# Patient Record
Sex: Male | Born: 1970 | Race: White | Hispanic: No | Marital: Married | Smoking: Current every day smoker
Health system: Southern US, Community
[De-identification: ages and names within clinical notes are randomized; demographics above are authoritative.]

## PROBLEM LIST (undated history)

## (undated) DIAGNOSIS — G8929 Other chronic pain: Secondary | ICD-10-CM

## (undated) DIAGNOSIS — C801 Malignant (primary) neoplasm, unspecified: Secondary | ICD-10-CM

## (undated) DIAGNOSIS — K219 Gastro-esophageal reflux disease without esophagitis: Secondary | ICD-10-CM

## (undated) DIAGNOSIS — M21379 Foot drop, unspecified foot: Secondary | ICD-10-CM

## (undated) DIAGNOSIS — F419 Anxiety disorder, unspecified: Secondary | ICD-10-CM

## (undated) DIAGNOSIS — F1910 Other psychoactive substance abuse, uncomplicated: Secondary | ICD-10-CM

## (undated) DIAGNOSIS — M1990 Unspecified osteoarthritis, unspecified site: Secondary | ICD-10-CM

## (undated) DIAGNOSIS — E860 Dehydration: Secondary | ICD-10-CM

## (undated) DIAGNOSIS — R569 Unspecified convulsions: Secondary | ICD-10-CM

## (undated) DIAGNOSIS — E039 Hypothyroidism, unspecified: Secondary | ICD-10-CM

## (undated) DIAGNOSIS — D649 Anemia, unspecified: Secondary | ICD-10-CM

## (undated) DIAGNOSIS — D72829 Elevated white blood cell count, unspecified: Secondary | ICD-10-CM

---

## 2009-10-10 ENCOUNTER — Emergency Department (HOSPITAL_COMMUNITY): Admission: EM | Admit: 2009-10-10 | Discharge: 2009-10-10 | Payer: Self-pay | Admitting: Emergency Medicine

## 2011-05-24 ENCOUNTER — Emergency Department (HOSPITAL_COMMUNITY)
Admission: EM | Admit: 2011-05-24 | Discharge: 2011-05-25 | Disposition: A | Discharge status: Home / Self Care | Payer: Medicare Other | Attending: Emergency Medicine | Admitting: Emergency Medicine

## 2011-05-24 ENCOUNTER — Emergency Department (HOSPITAL_COMMUNITY): Payer: Medicare Other

## 2011-05-24 DIAGNOSIS — M79609 Pain in unspecified limb: Secondary | ICD-10-CM | POA: Insufficient documentation

## 2011-05-24 DIAGNOSIS — K219 Gastro-esophageal reflux disease without esophagitis: Secondary | ICD-10-CM | POA: Insufficient documentation

## 2011-05-24 DIAGNOSIS — R609 Edema, unspecified: Secondary | ICD-10-CM | POA: Insufficient documentation

## 2011-05-24 DIAGNOSIS — M129 Arthropathy, unspecified: Secondary | ICD-10-CM | POA: Insufficient documentation

## 2011-05-24 DIAGNOSIS — M7989 Other specified soft tissue disorders: Secondary | ICD-10-CM | POA: Insufficient documentation

## 2011-05-24 DIAGNOSIS — E039 Hypothyroidism, unspecified: Secondary | ICD-10-CM | POA: Insufficient documentation

## 2011-05-24 DIAGNOSIS — G8929 Other chronic pain: Secondary | ICD-10-CM | POA: Insufficient documentation

## 2011-05-24 LAB — DIFFERENTIAL
Basophils Relative: 1 % (ref 0–1)
Eosinophils Absolute: 0.3 10*3/uL (ref 0.0–0.7)
Eosinophils Relative: 4 % (ref 0–5)
Lymphs Abs: 1.9 10*3/uL (ref 0.7–4.0)
Monocytes Absolute: 0.4 10*3/uL (ref 0.1–1.0)
Monocytes Relative: 6 % (ref 3–12)
Neutrophils Relative %: 62 % (ref 43–77)

## 2011-05-24 LAB — BASIC METABOLIC PANEL
Calcium: 8 mg/dL — ABNORMAL LOW (ref 8.4–10.5)
Creatinine, Ser: 0.99 mg/dL (ref 0.50–1.35)
GFR calc Af Amer: >60 mL/min (ref >60)
GFR calc non Af Amer: >60 mL/min (ref >60)
Sodium: 134 mEq/L — ABNORMAL LOW (ref 135–145)

## 2011-05-24 LAB — CBC
MCH: 29.9 pg (ref 26.0–34.0)
MCHC: 33.2 g/dL (ref 30.0–36.0)
MCV: 90.1 fL (ref 78.0–100.0)
Platelets: 341 10*3/uL (ref 150–400)
RBC: 3.34 MIL/uL — ABNORMAL LOW (ref 4.22–5.81)
RDW: 12.9 % (ref 11.5–15.5)

## 2011-05-24 LAB — PRO B NATRIURETIC PEPTIDE: Pro B Natriuretic peptide (BNP): 206 pg/mL — ABNORMAL HIGH (ref 0–125)

## 2011-06-09 ENCOUNTER — Emergency Department (HOSPITAL_COMMUNITY)
Admission: EM | Admit: 2011-06-09 | Discharge: 2011-06-09 | Disposition: A | Discharge status: Home / Self Care | Payer: Medicare Other | Attending: Emergency Medicine | Admitting: Emergency Medicine

## 2011-06-09 DIAGNOSIS — F259 Schizoaffective disorder, unspecified: Secondary | ICD-10-CM | POA: Insufficient documentation

## 2011-06-09 DIAGNOSIS — M79609 Pain in unspecified limb: Secondary | ICD-10-CM | POA: Insufficient documentation

## 2011-06-09 DIAGNOSIS — F411 Generalized anxiety disorder: Secondary | ICD-10-CM | POA: Insufficient documentation

## 2011-06-09 DIAGNOSIS — Z79899 Other long term (current) drug therapy: Secondary | ICD-10-CM | POA: Insufficient documentation

## 2011-06-09 DIAGNOSIS — E039 Hypothyroidism, unspecified: Secondary | ICD-10-CM | POA: Insufficient documentation

## 2011-06-09 DIAGNOSIS — R609 Edema, unspecified: Secondary | ICD-10-CM | POA: Insufficient documentation

## 2011-06-10 ENCOUNTER — Ambulatory Visit (HOSPITAL_COMMUNITY): Payer: Medicaid Other | Attending: Emergency Medicine

## 2011-06-11 ENCOUNTER — Emergency Department (HOSPITAL_COMMUNITY)
Admission: EM | Admit: 2011-06-11 | Discharge: 2011-06-11 | Disposition: A | Discharge status: Home / Self Care | Payer: Medicare Other | Attending: Emergency Medicine | Admitting: Emergency Medicine

## 2011-06-11 DIAGNOSIS — H6690 Otitis media, unspecified, unspecified ear: Secondary | ICD-10-CM | POA: Insufficient documentation

## 2011-06-11 DIAGNOSIS — H60399 Other infective otitis externa, unspecified ear: Secondary | ICD-10-CM | POA: Insufficient documentation

## 2011-06-11 DIAGNOSIS — Z79899 Other long term (current) drug therapy: Secondary | ICD-10-CM | POA: Insufficient documentation

## 2011-12-08 ENCOUNTER — Emergency Department (HOSPITAL_COMMUNITY): Payer: Medicare Other

## 2011-12-08 ENCOUNTER — Encounter (HOSPITAL_COMMUNITY): Payer: Self-pay | Admitting: Physical Medicine and Rehabilitation

## 2011-12-08 ENCOUNTER — Emergency Department (HOSPITAL_COMMUNITY)
Admission: EM | Admit: 2011-12-08 | Discharge: 2011-12-08 | Disposition: A | Discharge status: Home / Self Care | Payer: Medicare Other | Attending: Emergency Medicine | Admitting: Emergency Medicine

## 2011-12-08 DIAGNOSIS — G8929 Other chronic pain: Secondary | ICD-10-CM

## 2011-12-08 DIAGNOSIS — J45909 Unspecified asthma, uncomplicated: Secondary | ICD-10-CM | POA: Insufficient documentation

## 2011-12-08 DIAGNOSIS — M25579 Pain in unspecified ankle and joints of unspecified foot: Secondary | ICD-10-CM | POA: Insufficient documentation

## 2011-12-08 DIAGNOSIS — Z79899 Other long term (current) drug therapy: Secondary | ICD-10-CM | POA: Insufficient documentation

## 2011-12-08 DIAGNOSIS — Z8739 Personal history of other diseases of the musculoskeletal system and connective tissue: Secondary | ICD-10-CM | POA: Insufficient documentation

## 2011-12-08 HISTORY — DX: Other chronic pain: G89.29

## 2011-12-08 HISTORY — DX: Unspecified convulsions: R56.9

## 2011-12-08 HISTORY — DX: Foot drop, unspecified foot: M21.379

## 2011-12-08 HISTORY — DX: Anxiety disorder, unspecified: F41.9

## 2011-12-08 HISTORY — DX: Unspecified osteoarthritis, unspecified site: M19.90

## 2011-12-08 HISTORY — DX: Anemia, unspecified: D64.9

## 2011-12-08 MED ORDER — OXYCODONE-ACETAMINOPHEN 5-325 MG PO TABS
2.0000 | ORAL_TABLET | Freq: Once | ORAL | Status: AC
  Administered 2011-12-08: 2 via ORAL
  Filled 2011-12-08: qty 2

## 2011-12-08 MED ORDER — ACETAMINOPHEN-CODEINE #3 300-30 MG PO TABS: 1.0000 | ORAL_TABLET | Freq: Four times a day (QID) | ORAL | Status: AC | PRN

## 2011-12-08 NOTE — ED Provider Notes (Signed)
History     CSN: [id number]  Arrival date & time 12/08/11  1358   First MD Initiated Contact with Patient 12/08/11 1423      Chief Complaint  Patient presents with  . Foot Pain    BIL and leg pain,skin is red     (Consider location/radiation/quality/duration/timing/severity/associated sxs/prior treatment) Patient is a 41 y.o. male presenting with lower extremity pain. The history is provided by the patient. No language interpreter was used.  Foot Pain This is a chronic problem. The current episode started more than 1 week ago (2 weeks ago). The problem occurs constantly. The problem has been gradually worsening. Pertinent negatives include no chest pain, no abdominal pain, no headaches and no shortness of breath. The symptoms are aggravated by walking and standing. Relieved by: tylenol 3 - vicodin not helping.    Past Medical History  Diagnosis Date  . Chronic pain   . Anxiety   . Schizoaffective disorder   . Hearing loss   . Seizure   . Arthritis   . Asthma   . Foot drop   . Anemia     History reviewed. No pertinent past surgical history.  History reviewed. No pertinent family history.  History  Substance Use Topics  . Smoking status: Never Smoker   . Smokeless tobacco: Not on file  . Alcohol Use: No      Review of Systems  Constitutional: Negative for fever, activity change, appetite change and fatigue.  HENT: Negative for congestion, sore throat, rhinorrhea, neck pain and neck stiffness.   Respiratory: Negative for cough and shortness of breath.   Cardiovascular: Negative for chest pain and palpitations.  Gastrointestinal: Negative for nausea, vomiting and abdominal pain.  Genitourinary: Negative for dysuria, urgency, frequency and flank pain.  Musculoskeletal: Positive for myalgias, arthralgias and gait problem. Negative for back pain.  Neurological: Negative for dizziness, weakness, light-headedness, numbness and headaches.  All other systems reviewed and  are negative.    Allergies  Review of patient's allergies indicates no known allergies.  Home Medications   Current Outpatient Rx  Name Route Sig Dispense Refill  . ALBUTEROL SULFATE HFA 108 (90 BASE) MCG/ACT IN AERS Inhalation Inhale 2 puffs into the lungs every 4 (four) hours as needed. For wheezing.    Marland Kitchen ALPRAZOLAM 0.5 MG PO TABS Oral Take 0.5 mg by mouth daily at 12 noon.    . ARIPIPRAZOLE 5 MG PO TABS Oral Take 5 mg by mouth at bedtime.    Marland Kitchen BENZTROPINE MESYLATE 0.5 MG PO TABS Oral Take 0.5 mg by mouth 2 (two) times daily. Given 2 hours after thyroid medication    . DOXEPIN HCL 50 MG PO CAPS Oral Take 50 mg by mouth at bedtime.    Marland Kitchen FERROUS SULFATE 325 (65 FE) MG PO TABS Oral Take 325 mg by mouth daily with breakfast.    . FLUOXETINE HCL 20 MG PO CAPS Oral Take 20 mg by mouth daily.    Marland Kitchen FLUTICASONE-SALMETEROL 100-50 MCG/DOSE IN AEPB Inhalation Inhale 1 puff into the lungs 2 (two) times daily.    Marland Kitchen LEVOTHYROXINE SODIUM 75 MCG PO TABS Oral Take 75 mcg by mouth daily.    Marland Kitchen LORATADINE 10 MG PO TABS Oral Take 10 mg by mouth daily.    . MEGESTROL ACETATE 40 MG/ML PO SUSP Oral Take 400 mg by mouth daily.    Marland Kitchen ENSURE PO Oral Take 1 Can by mouth 3 (three) times daily with meals.    Marland Kitchen OLANZAPINE 15  MG PO TABS Oral Take 15 mg by mouth at bedtime.    . OMEPRAZOLE 40 MG PO CPDR Oral Take 40 mg by mouth 2 (two) times daily.    Marland Kitchen PHENYTOIN SODIUM EXTENDED 100 MG PO CAPS Oral Take 300 mg by mouth at bedtime.    Marland Kitchen POLYETHYLENE GLYCOL 3350 PO POWD Oral Take 17 g by mouth daily.    Marland Kitchen SOLIFENACIN SUCCINATE 5 MG PO TABS Oral Take 10 mg by mouth daily.    . ACETAMINOPHEN-CODEINE #3 300-30 MG PO TABS Oral Take 1-2 tablets by mouth every 6 (six) hours as needed for pain. 20 tablet 0    BP 105/72  Pulse 88  Resp 18  SpO2 100%  Physical Exam  Nursing note and vitals reviewed. Constitutional: He is oriented to person, place, and time. He appears well-developed and well-nourished. No distress.    HENT:  Head: Normocephalic and atraumatic.  Mouth/Throat: Oropharynx is clear and moist.  Eyes: Conjunctivae and EOM are normal. Pupils are equal, round, and reactive to light.  Neck: Normal range of motion. Neck supple.  Cardiovascular: Normal rate, regular rhythm, normal heart sounds and intact distal pulses.  Exam reveals no gallop and no friction rub.   No murmur heard. Pulmonary/Chest: Effort normal and breath sounds normal. No respiratory distress.  Abdominal: Soft. Bowel sounds are normal. There is no tenderness.  Musculoskeletal: Normal range of motion. He exhibits tenderness (R lateral foot).       No deformity.  Able to ambulate without difficulty  Neurological: He is alert and oriented to person, place, and time. No cranial nerve deficit.  Skin: Skin is warm and dry.    ED Course  Procedures (including critical care time)  Labs Reviewed - No data to display Dg Foot Complete Right  12/08/2011  *RADIOLOGY REPORT*  Clinical Data: Right foot pain  RIGHT FOOT COMPLETE - 3+ VIEW  Comparison: None.  Findings: Bones are markedly osteopenic.  No acute fracture and no dislocation.  Linear sclerotic densities in the distal tibia are of unknown significance but may represent bone infarcts.  Mild soft tissue swelling over the forefoot.  IMPRESSION: No acute bony pathology.  Chronic change.  Original Report Authenticated By: Donavan Burnet, M.D.     1. Chronic foot pain       MDM  Chronic foot pain. He'll be prescribed Tylenol 3 as he states this is what helps his pain. Received a dose of Percocet in emergency department. Imaging is negative. Will be discharged home with instructions to followup        Dayton Bailiff, MD 12/08/11 1535

## 2011-12-08 NOTE — Discharge Instructions (Signed)
Chronic Pain °Chronic pain can be defined as pain that is lasting, off and on, and lasts for 3 to 6 months or longer. Many things cause chronic pain, which can make it difficult to make a discrete diagnosis. There are many treatment options available for chronic pain. However, finding a treatment that works well for you may require trying various approaches until a suitable one is found. °CAUSES  °In some types of chronic medical conditions, the pain is caused by a normal pain response within the body. A normal pain response helps the body identify illness or injury and prevent further damage from being done. In these cases, the cause of the pain may be identified and treated, even if it may not be cured completely. Examples of chronic conditions which can cause chronic pain include: °· Inflammation of the joints (arthritis).  °· Back pain or neck pain (including bulging or herniated disks).  °· Migraine headaches.  °· Cancer.  °In some other types of chronic pain syndromes, the pain is caused by an abnormal pain response within the body. An abnormal pain response is present when there is no ongoing cause (or stimulus) for the pain, or when the cause of the pain is arising from the nerves or nervous system itself. Examples of conditions which can cause chronic pain due to an abnormal pain response include: °· Fibromyalgia.  °· Reflex sympathetic dystrophy (RSD).  °· Neuropathy (when the nerves themselves are damaged, and may cause pain).  °DIAGNOSIS  °Your caregiver will help diagnose your condition over time. In many cases, the initial focus will be on excluding conditions that could be causing the pain. Depending on your symptoms, your caregiver may order some tests to diagnose your condition. Some of these tests include: °· Blood tests.  °· Computerized X-ray scans (CT scan).  °· Computerized magnetic scans (MRI).  °· X-rays.  °· Ultrasounds.  °· Nerve conduction studies.  °· Consultation with other physicians or  specialists.  °TREATMENT  °There are many treatment options for people suffering from chronic pain. Finding a treatment that works well may take time.  °· You may be referred to a pain management specialist.  °· You may be put on medication to help with the pain. Unfortunately, some medications (such as opiate medications) may not be very effective in cases where chronic pain is due to abnormal pain responses. Finding the right medications can take some time.  °· Adjunctive therapies may be used to provide additional relief and improve a patient's quality of life. These therapies include:  °· Mindfulness meditation.  °· Acupuncture.  °· Biofeedback.  °· Cognitive-behavioral therapy.  °· In certain cases, surgical interventions may be attempted.  °HOME CARE INSTRUCTIONS  °· Make sure you understand these instructions prior to discharge.  °· Ask any questions and share any further concerns you have with your caregiver prior to discharge.  °· Take all medications as directed by your caregiver.  °· Keep all follow-up appointments.  °SEEK MEDICAL CARE IF:  °· Your pain gets worse.  °· You develop a new pain that was not present before.  °· You cannot tolerate any medications prescribed by your caregiver.  °· You develop new symptoms since your last visit with your caregiver.  °SEEK IMMEDIATE MEDICAL CARE IF:  °· You develop muscular weakness.  °· You have decreased sensation or numbness.  °· You lose control of bowel or bladder function.  °· Your pain suddenly gets much worse.  °· You have an oral   temperature above 102° F (38.9° C), not controlled by medication.  °· You develop shaking chills, confusion, chest pain, or shortness of breath.  °Document Released: 06/22/2002 Document Revised: 06/12/2011 Document Reviewed: 09/28/2008 °[employer]® Patient Information ©2012 [employer]. °

## 2011-12-08 NOTE — ED Notes (Signed)
Pt presents to department for evaluation of chronic bilateral foot pain. Ongoing for 2-3weeks. Pt states "I have chronic nerve pain." able to transfer from wheelchair to exam room chair. No obvious injuries/deformity noted. Pedal pulses present bilaterally. Skin warm and dry. Pt alert and oriented x4.

## 2011-12-08 NOTE — ED Notes (Signed)
PT reports chronic pain in bil feet, legs

## 2012-01-16 ENCOUNTER — Emergency Department (HOSPITAL_COMMUNITY)
Admission: EM | Admit: 2012-01-16 | Discharge: 2012-01-16 | Disposition: A | Discharge status: Skilled Nursing Facility | Payer: Medicare Other | Attending: Emergency Medicine | Admitting: Emergency Medicine

## 2012-01-16 ENCOUNTER — Emergency Department (HOSPITAL_COMMUNITY): Payer: Medicare Other

## 2012-01-16 DIAGNOSIS — W050XXA Fall from non-moving wheelchair, initial encounter: Secondary | ICD-10-CM | POA: Insufficient documentation

## 2012-01-16 DIAGNOSIS — G8929 Other chronic pain: Secondary | ICD-10-CM | POA: Insufficient documentation

## 2012-01-16 DIAGNOSIS — Z79899 Other long term (current) drug therapy: Secondary | ICD-10-CM | POA: Insufficient documentation

## 2012-01-16 DIAGNOSIS — G40909 Epilepsy, unspecified, not intractable, without status epilepticus: Secondary | ICD-10-CM | POA: Insufficient documentation

## 2012-01-16 DIAGNOSIS — M129 Arthropathy, unspecified: Secondary | ICD-10-CM | POA: Insufficient documentation

## 2012-01-16 DIAGNOSIS — J45909 Unspecified asthma, uncomplicated: Secondary | ICD-10-CM | POA: Insufficient documentation

## 2012-01-16 DIAGNOSIS — R071 Chest pain on breathing: Secondary | ICD-10-CM | POA: Insufficient documentation

## 2012-01-16 DIAGNOSIS — R0789 Other chest pain: Secondary | ICD-10-CM

## 2012-01-16 DIAGNOSIS — S37019A Minor contusion of unspecified kidney, initial encounter: Secondary | ICD-10-CM

## 2012-01-16 LAB — URINALYSIS, ROUTINE W REFLEX MICROSCOPIC
Bilirubin Urine: NEGATIVE
Glucose, UA: NEGATIVE mg/dL
Ketones, ur: NEGATIVE mg/dL
Leukocytes, UA: NEGATIVE
Nitrite: NEGATIVE
Protein, ur: NEGATIVE mg/dL
Specific Gravity, Urine: 1.013 (ref 1.005–1.030)
Urobilinogen, UA: 0.2 mg/dL (ref 0.0–1.0)
pH: 6.5 (ref 5.0–8.0)

## 2012-01-16 LAB — URINE MICROSCOPIC-ADD ON

## 2012-01-16 MED ORDER — HYDROCODONE-ACETAMINOPHEN 5-325 MG PO TABS
1.0000 | ORAL_TABLET | Freq: Once | ORAL | Status: AC
  Administered 2012-01-16: 1 via ORAL
  Filled 2012-01-16: qty 1

## 2012-01-16 MED ORDER — KETOROLAC TROMETHAMINE 60 MG/2ML IM SOLN
60.0000 mg | Freq: Once | INTRAMUSCULAR | Status: AC
  Administered 2012-01-16: 60 mg via INTRAMUSCULAR
  Filled 2012-01-16: qty 2

## 2012-01-16 MED ORDER — TRAMADOL HCL 50 MG PO TABS: 50.0000 mg | ORAL_TABLET | Freq: Four times a day (QID) | ORAL | Status: AC | PRN

## 2012-01-16 NOTE — ED Notes (Signed)
Per ptar is from [facility]. Pt has schitzoaffective disorder and poly substance abuse.  pt has footdrop and wears an orthotic. Pt reports he doesn't walk. 4 days ago pt was in wheelchair and flipped it and fell out of the wheelchair and the wheelchair landed on top of him. At that time pt did not go to the ER to be evaluated. At present complaining of ongoing rib pain to left side. 10/10. Good equal lung sounds and chest expansion. Abdomin tender to palpation no crepitus noted. Pt is hard of hearing. No dyspnea.

## 2012-01-16 NOTE — ED Provider Notes (Signed)
History     CSN: [id number]  Arrival date & time 01/16/12  1306   First MD Initiated Contact with Patient 01/16/12 1333      Chief Complaint  Patient presents with  . Fall    Larey Seat out of W/C four days ago. C/O left flank pain.    (Consider location/radiation/quality/duration/timing/severity/associated sxs/prior treatment) HPI Comments: Pt reports fell from wheelchair and has had persistent left rib pain, worse with coughing.  He has been receiving tylenol #3 and reports no improvmeen tin pain.  No N/V/D, no abd pain, back pain, no LOC or syncope.  No rash or bruising.  No fever or cough.  Denies focal weakness or numbness.    The history is provided by the patient.    Past Medical History  Diagnosis Date  . Chronic pain   . Anxiety   . Schizoaffective disorder   . Hearing loss   . Seizure   . Arthritis   . Asthma   . Foot drop   . Anemia     No past surgical history on file.  No family history on file.  History  Substance Use Topics  . Smoking status: Never Smoker   . Smokeless tobacco: Not on file  . Alcohol Use: No      Review of Systems  Constitutional: Negative.   HENT: Negative for neck pain.   Respiratory: Negative for shortness of breath.   Cardiovascular: Positive for chest pain. Negative for leg swelling.  Musculoskeletal: Negative for back pain.  Neurological: Negative for syncope.    Allergies  Allegra; Percocet; and Thorazine  Home Medications   Current Outpatient Rx  Name Route Sig Dispense Refill  . ALPRAZOLAM 0.5 MG PO TABS Oral Take 0.5 mg by mouth daily at 12 noon.    . ARIPIPRAZOLE 5 MG PO TABS Oral Take 5 mg by mouth at bedtime.    Marland Kitchen BENZTROPINE MESYLATE 0.5 MG PO TABS Oral Take 0.5 mg by mouth 2 (two) times daily. Given 2 hours after thyroid medication    . DOXEPIN HCL 50 MG PO CAPS Oral Take 50 mg by mouth at bedtime.    Marland Kitchen FERROUS SULFATE 325 (65 FE) MG PO TABS Oral Take 325 mg by mouth daily with breakfast.    . FLUOXETINE HCL  20 MG PO CAPS Oral Take 20 mg by mouth daily.    Marland Kitchen FLUTICASONE-SALMETEROL 100-50 MCG/DOSE IN AEPB Inhalation Inhale 1 puff into the lungs 2 (two) times daily.    Marland Kitchen LEVOTHYROXINE SODIUM 75 MCG PO TABS Oral Take 75 mcg by mouth daily.    Marland Kitchen LORATADINE 10 MG PO TABS Oral Take 10 mg by mouth daily.    . MEGESTROL ACETATE 40 MG/ML PO SUSP Oral Take 400 mg by mouth daily.    Marland Kitchen ENSURE PO Oral Take 1 Can by mouth 3 (three) times daily with meals.    Marland Kitchen OLANZAPINE 15 MG PO TABS Oral Take 15 mg by mouth at bedtime.    . OMEPRAZOLE 40 MG PO CPDR Oral Take 40 mg by mouth 2 (two) times daily.    Marland Kitchen PHENYTOIN SODIUM EXTENDED 100 MG PO CAPS Oral Take 300 mg by mouth at bedtime.    Marland Kitchen POLYETHYLENE GLYCOL 3350 PO POWD Oral Take 17 g by mouth daily.    Marland Kitchen SIMVASTATIN 10 MG PO TABS Oral Take 10 mg by mouth at bedtime.    . SOLIFENACIN SUCCINATE 5 MG PO TABS Oral Take 10 mg by mouth daily.    Marland Kitchen  ALBUTEROL SULFATE HFA 108 (90 BASE) MCG/ACT IN AERS Inhalation Inhale 2 puffs into the lungs every 4 (four) hours as needed. For wheezing.    Marland Kitchen TRAMADOL HCL 50 MG PO TABS Oral Take 1 tablet (50 mg total) by mouth every 6 (six) hours as needed for pain. 20 tablet 0    BP 113/66  Pulse 99  Temp(Src) 98.4 F (36.9 C) (Oral)  Resp 16  SpO2 95%  Physical Exam  Nursing note and vitals reviewed. Constitutional: He appears well-developed and well-nourished. No distress.  HENT:  Head: Normocephalic.  Cardiovascular: Normal rate and regular rhythm.   Pulmonary/Chest: Effort normal and breath sounds normal. No respiratory distress. He has no wheezes. He has no rales. He exhibits tenderness.  Abdominal: Soft.  Neurological: He is alert.  Skin: Skin is warm. No rash noted.    ED Course  Procedures (including critical care time)   Labs Reviewed  URINALYSIS, ROUTINE W REFLEX MICROSCOPIC   Dg Ribs Unilateral W/chest Left  01/16/2012  *RADIOLOGY REPORT*  Clinical Data: Fall from wheelchair.  Left lower anterior rib pain.   LEFT RIBS AND CHEST - 3+ VIEW  Comparison: Two-view chest 05/24/2011.  Findings: The heart size is normal.  Emphysematous changes are again noted.  No focal airspace disease is evident.  Dedicated views of the ribs demonstrate no acute or healing fractures.  IMPRESSION:  1.  No evidence for acute or healing fracture. 2.  No pneumothorax. 3.  Emphysema. 4.  No acute cardiopulmonary disease.  Original Report Authenticated By: Jamesetta Orleans. MATTERN, M.D.     1. Chest wall pain       MDM  Pt with no guard or rebound.  Lungs clear, RA sats are normal.  Awaiting UA for gross hematuria.  Otherwise can go home on ultram.  Signed out to Dr. Radford Pax.          Gavin Pound. Tatsuo Musial, MD 01/16/12 1549

## 2012-01-16 NOTE — Discharge Instructions (Signed)
Chest Wall Pain Chest wall pain is pain in or around the bones and muscles of your chest. It may take up to 6 weeks to get better. It may take longer if you must stay physically active in your work and activities.  CAUSES  Chest wall pain may happen on its own. However, it may be caused by:  A viral illness like the flu.   Injury.   Coughing.   Exercise.   Arthritis.   Fibromyalgia.   Shingles.  HOME CARE INSTRUCTIONS   Avoid overtiring physical activity. Try not to strain or perform activities that cause pain. This includes any activities using your chest or your abdominal and side muscles, especially if heavy weights are used.   Put ice on the sore area.   Put ice in a plastic bag.   Place a towel between your skin and the bag.   Leave the ice on for 15 to 20 minutes per hour while awake for the first 2 days.   Only take over-the-counter or prescription medicines for pain, discomfort, or fever as directed by your caregiver.  SEEK IMMEDIATE MEDICAL CARE IF:   Your pain increases, or you are very uncomfortable.   You have a fever.   Your chest pain becomes worse.   You have new, unexplained symptoms.   You have nausea or vomiting.   You feel sweaty or lightheaded.   You have a cough with phlegm (sputum), or you cough up blood.  MAKE SURE YOU:   Understand these instructions.   Will watch your condition.   Will get help right away if you are not doing well or get worse.  Document Released: 09/30/2005 Document Revised: 09/19/2011 Document Reviewed: 05/27/2011 [employer] Patient Information 2012 [place], [place]   Kidney Injuries  The kidneys are 2 organs that lie on either side of the spine between the middle of the back and the front of the abdomen. Kidneys filter the blood and remove excess water (urine) from the body. An outside force (trauma), immune response, medicine, procedure, or acute condition or disease can result in injury to the kidneys. When this  happens, the kidneys cannot function properly. Injury may be mild to severe. CAUSES  Bleeding, swollen (inflamed), blocked, or damaged blood vessels of the kidney may be the result of several conditions.  Birth defects (congenital conditions), chronic conditions, or life-threatening conditions, such as:   Weakness in a blood vessel (aneurysm).   Blockage in the heart or arteries.   Abnormal connection between an artery and a vein (fistula).   Blood clot (thrombosis) in the vein that drains blood from the kidney.   Tumor.   Autoimmune disease.   Obstruction between the kidney and the ureter. The ureter is the tube that drains urine from the kidney to the bladder.   Infection.   Conditions or treatments that cause a buildup of uric acid, such as gout or cancer treatments.   Exposure or ingestion of poisons or toxic substances, such as:   Lead.   Cleaning products or solvents.   Fuels.   Medical procedures, such as a:   Surgery.   Biopsy.   Long-term use of pain medicines.   Trauma, such as a:   Car accident.   Gunshot or knife wound.   Severe physical blow or force to the kidney area (such as sustained through sports or violence).  SYMPTOMS   Abdominal pain and swelling.   Back pain and swelling.   Pain on one side of  the body near the abdomen and back.   Blood in the urine.   Urinating less than usual.   Inability to urinate.   Pale skin color.   Feeling sick to your stomach (nauseous).   Throwing up (vomiting).   Fever.   Sweating.   Feeling tired and confused.   Weakness.   Bruising in the back area.  Chronic symptoms may include:  Constipation.   Irritability.   Weight loss.  DIAGNOSIS Diagnosis is made by:  Physical exam and patient history.   Blood tests.   Urine tests.   Imaging tests, such as X-rays, CT scans, an ultrasound, or an MRI.  TREATMENT A limited activity schedule or bed rest for several days may be all that  is needed for mild injury. Other treatments may include the use of medicines to relieve pain, monitoring by a caregiver, or specific dietary changes.  Treatment for severe kidney injury is aimed at treating the acute symptoms and the cause of the injury.Removal of toxins from the blood (dialysis) may be required in some cases. Medicines may be given to treat the damage caused by exposure to poisons or to treat other conditions (such as gout). Certain medicines or substances that caused the injury will need to be avoided. Oral or intravenous (IV) nutritional support may also be necessary. In some cases, surgery is needed to repair the damage and to restore kidney function. Surgery may involve:   Repair of a torn (fractured) kidney.   Removal of an obstruction.   Removal of the entire kidney.   Drainage of fluid or blood.   Cessation of bleeding.  PREVENTION To prevent kidney injuries, be sure to:  Manage chronic conditions, such as diabetes, high blood pressure, high cholesterol, or gout.   Understand your medicines and their possible side effects.   Stay well hydrated. Drink enough water and fluids to keep your urine clear or pale yellow.   Avoid exposure to lead (found in paints and metals).   Use cleaning products and other solvents in well-ventilated areas.   Always wear a seat belt when riding in a car.   Use proper protective equipment when participating in sports.   Avoid harm or violence.  SEEK MEDICAL CARE IF:  You are feeling ill or have severe pain in the back or side.   You have signs of infection (fever, feeling confused).   You notice blood in your urine.  SEEK IMMEDIATE MEDICAL CARE IF:  The amount of urine you produce has noticeably decreased or increased.   You develop shortness of breath even without exertion.   You experience unexplained weakness and fatigue.  Document Released: 04/15/2011 Document Revised: 09/19/2011 Document Reviewed:  04/15/2011 [facility] Patient Information 2012 [place], [place].Marland Kitchen

## 2012-01-28 ENCOUNTER — Emergency Department (HOSPITAL_COMMUNITY)
Admission: EM | Admit: 2012-01-28 | Discharge: 2012-01-28 | Disposition: A | Discharge status: Home / Self Care | Payer: Medicare Other | Attending: Emergency Medicine | Admitting: Emergency Medicine

## 2012-01-28 ENCOUNTER — Encounter (HOSPITAL_COMMUNITY): Payer: Self-pay | Admitting: Emergency Medicine

## 2012-01-28 ENCOUNTER — Emergency Department (HOSPITAL_COMMUNITY): Payer: Medicare Other

## 2012-01-28 DIAGNOSIS — E860 Dehydration: Secondary | ICD-10-CM | POA: Insufficient documentation

## 2012-01-28 DIAGNOSIS — F1910 Other psychoactive substance abuse, uncomplicated: Secondary | ICD-10-CM | POA: Insufficient documentation

## 2012-01-28 DIAGNOSIS — K5900 Constipation, unspecified: Secondary | ICD-10-CM | POA: Insufficient documentation

## 2012-01-28 DIAGNOSIS — F411 Generalized anxiety disorder: Secondary | ICD-10-CM | POA: Insufficient documentation

## 2012-01-28 DIAGNOSIS — Z8739 Personal history of other diseases of the musculoskeletal system and connective tissue: Secondary | ICD-10-CM | POA: Insufficient documentation

## 2012-01-28 DIAGNOSIS — E039 Hypothyroidism, unspecified: Secondary | ICD-10-CM | POA: Insufficient documentation

## 2012-01-28 DIAGNOSIS — G8929 Other chronic pain: Secondary | ICD-10-CM | POA: Insufficient documentation

## 2012-01-28 DIAGNOSIS — J40 Bronchitis, not specified as acute or chronic: Secondary | ICD-10-CM | POA: Insufficient documentation

## 2012-01-28 DIAGNOSIS — K219 Gastro-esophageal reflux disease without esophagitis: Secondary | ICD-10-CM | POA: Insufficient documentation

## 2012-01-28 DIAGNOSIS — I959 Hypotension, unspecified: Secondary | ICD-10-CM | POA: Insufficient documentation

## 2012-01-28 DIAGNOSIS — R05 Cough: Secondary | ICD-10-CM

## 2012-01-28 HISTORY — DX: Other psychoactive substance abuse, uncomplicated: F19.10

## 2012-01-28 HISTORY — DX: Gastro-esophageal reflux disease without esophagitis: K21.9

## 2012-01-28 HISTORY — DX: Hypothyroidism, unspecified: E03.9

## 2012-01-28 HISTORY — DX: Dehydration: E86.0

## 2012-01-28 MED ORDER — BENZONATATE 100 MG PO CAPS: 100.0000 mg | ORAL_CAPSULE | Freq: Three times a day (TID) | ORAL | Status: AC

## 2012-01-28 MED ORDER — AZITHROMYCIN 250 MG PO TABS: 500.0000 mg | ORAL_TABLET | Freq: Every day | ORAL | Status: AC

## 2012-01-28 NOTE — ED Provider Notes (Signed)
History     CSN: [id number]  Arrival date & time 01/28/12  2130   First MD Initiated Contact with Patient 01/28/12 2240      Chief Complaint  Patient presents with  . Cough    (Consider location/radiation/quality/duration/timing/severity/associated sxs/prior treatment) HPI  41 year old male with history of polysubstance abuse, asthma, and anxiety presents with a chief complaints of cough. Patient resides at [facility].  Patient states for the past week he has been having cough productive of yellow sputum. Onset was gradual, and persistent. Cough is throughout the day. He got nausea from coughing but denies vomiting or having diarrhea. He denies fever, chills. He denies chest pain, abdominal pain, or back pain. He denies any rash. Denies ear pain, sneezing, or runny nose.` He is a smoker and smokes about one pack of cigarettes per day. He denies any hemoptysis.  Past Medical History  Diagnosis Date  . Chronic pain   . Anxiety   . Schizoaffective disorder   . Hearing loss   . Seizure   . Arthritis   . Asthma   . Foot drop   . Anemia   . Dehydration   . Hypotension   . GERD (gastroesophageal reflux disease)   . Hypothyroidism   . Constipation   . Polysubstance abuse     History reviewed. No pertinent past surgical history.  History reviewed. No pertinent family history.  History  Substance Use Topics  . Smoking status: Never Smoker   . Smokeless tobacco: Not on file  . Alcohol Use: No      Review of Systems  All other systems reviewed and are negative.    Allergies  Allegra; Percocet; and Thorazine  Home Medications   Current Outpatient Rx  Name Route Sig Dispense Refill  . ALBUTEROL SULFATE HFA 108 (90 BASE) MCG/ACT IN AERS Inhalation Inhale 2 puffs into the lungs every 4 (four) hours as needed. For wheezing.    Marland Kitchen ALPRAZOLAM 0.5 MG PO TABS Oral Take 0.5 mg by mouth daily at 12 noon.    . ARIPIPRAZOLE 5 MG PO TABS Oral Take 5 mg by mouth at bedtime.    Marland Kitchen  BENZTROPINE MESYLATE 0.5 MG PO TABS Oral Take 0.5 mg by mouth 2 (two) times daily. Given 2 hours after thyroid medication    . DOXEPIN HCL 50 MG PO CAPS Oral Take 50 mg by mouth at bedtime.    Marland Kitchen FERROUS SULFATE 325 (65 FE) MG PO TABS Oral Take 325 mg by mouth daily with breakfast.    . FLUOXETINE HCL 20 MG PO CAPS Oral Take 20 mg by mouth daily.    Marland Kitchen FLUTICASONE-SALMETEROL 100-50 MCG/DOSE IN AEPB Inhalation Inhale 1 puff into the lungs 2 (two) times daily.    Marland Kitchen LEVOTHYROXINE SODIUM 75 MCG PO TABS Oral Take 75 mcg by mouth daily.    Marland Kitchen LORATADINE 10 MG PO TABS Oral Take 10 mg by mouth daily.    . MEGESTROL ACETATE 40 MG/ML PO SUSP Oral Take 400 mg by mouth daily.    Marland Kitchen ENSURE PO Oral Take 1 Can by mouth 3 (three) times daily with meals.    Marland Kitchen OLANZAPINE 15 MG PO TABS Oral Take 15 mg by mouth at bedtime.    . OMEPRAZOLE 40 MG PO CPDR Oral Take 40 mg by mouth 2 (two) times daily.    Marland Kitchen PHENYTOIN SODIUM EXTENDED 100 MG PO CAPS Oral Take 300 mg by mouth at bedtime.    Marland Kitchen POLYETHYLENE GLYCOL 3350 PO  POWD Oral Take 17 g by mouth daily.    Marland Kitchen SIMVASTATIN 10 MG PO TABS Oral Take 10 mg by mouth at bedtime.    . SOLIFENACIN SUCCINATE 5 MG PO TABS Oral Take 10 mg by mouth daily.      BP 140/86  Pulse 93  Temp(Src) 98.3 F (36.8 C) (Oral)  Resp 14  SpO2 98%  Physical Exam  Nursing note and vitals reviewed. Constitutional: He is oriented to person, place, and time. No distress.       Cachectic in appearance. With sparse hair. Edentulous.  HENT:  Head: Normocephalic and atraumatic.  Nose: Nose normal.  Mouth/Throat: Oropharynx is clear and moist. No oropharyngeal exudate.       Hard of hearing  Eyes: Conjunctivae are normal.  Neck: Neck supple.  Cardiovascular: Normal rate.   Pulmonary/Chest: Effort normal and breath sounds normal. No respiratory distress. He has no wheezes. He has no rales. He exhibits no tenderness.  Abdominal: Soft.  Musculoskeletal: Normal range of motion.  Lymphadenopathy:     He has no cervical adenopathy.  Neurological: He is alert and oriented to person, place, and time.  Skin: Skin is warm. He is not diaphoretic.  Psychiatric: He has a normal mood and affect.    ED Course  Procedures (including critical care time)  Labs Reviewed - No data to display Dg Chest 2 View  01/28/2012  *RADIOLOGY REPORT*  Clinical Data: Shortness of breath and cough  CHEST - 2 VIEW  Comparison: Chest radiograph 01/16/2012  Findings: Emphysematous changes are present.  There is chronic linear scarring in the right upper lobe.  There is slight chronic peribronchial thickening.  No focal airspace disease, effusion, or pneumothorax.  The trachea is midline.  No acute bony abnormality is identified.  IMPRESSION:  1.  Emphysema and chronic peribronchial thickening. 2.  No acute superimposed process is identified in the chest.  Original Report Authenticated By: Britta Mccreedy, M.D.     No diagnosis found.    MDM  Persistent cough, and chest x-ray shows emphysema and chronic periobronchial thickening.  Will prescribe zpak and cough medication.  Pt is afebrile with stable normal vital sign.  No acute resp distress.         Fayrene Helper, PA-C 01/28/12 2252

## 2012-01-28 NOTE — ED Notes (Signed)
Report called to [facility].  [facility] enroute to take patient back to facility.

## 2012-01-28 NOTE — ED Notes (Signed)
Patient with cough that has increased in the last three weeks.  Patient states that he has a productive cough, green phlegm produced.  No shortness of breath noted.

## 2012-01-28 NOTE — Discharge Instructions (Signed)

## 2012-01-28 NOTE — ED Notes (Signed)
Per EMS, patient from [facility].  Patient complains of coughing up phlegm for the past three weeks.  Patient has smoker history.  Denies shortness of breath and chest pain.  O2 98% on RA.  Lungs clear in all air fields.

## 2012-01-29 NOTE — ED Provider Notes (Signed)
Medical screening examination/treatment/procedure(s) were performed by non-physician practitioner and as supervising physician I was immediately available for consultation/collaboration.   Darsi Tien, MD 01/29/12 0104 

## 2012-02-11 ENCOUNTER — Emergency Department (HOSPITAL_COMMUNITY)
Admission: EM | Admit: 2012-02-11 | Discharge: 2012-02-11 | Disposition: A | Discharge status: Skilled Nursing Facility | Payer: Medicare Other | Attending: Emergency Medicine | Admitting: Emergency Medicine

## 2012-02-11 ENCOUNTER — Encounter (HOSPITAL_COMMUNITY): Payer: Self-pay | Admitting: *Deleted

## 2012-02-11 DIAGNOSIS — G40909 Epilepsy, unspecified, not intractable, without status epilepticus: Secondary | ICD-10-CM | POA: Insufficient documentation

## 2012-02-11 DIAGNOSIS — R569 Unspecified convulsions: Secondary | ICD-10-CM

## 2012-02-11 DIAGNOSIS — E039 Hypothyroidism, unspecified: Secondary | ICD-10-CM | POA: Insufficient documentation

## 2012-02-11 DIAGNOSIS — Z79899 Other long term (current) drug therapy: Secondary | ICD-10-CM | POA: Insufficient documentation

## 2012-02-11 LAB — CBC
HCT: 34.9 % — ABNORMAL LOW (ref 39.0–52.0)
Platelets: 299 10*3/uL (ref 150–400)
RBC: 3.69 MIL/uL — ABNORMAL LOW (ref 4.22–5.81)
RDW: 13.7 % (ref 11.5–15.5)
WBC: 12.4 10*3/uL — ABNORMAL HIGH (ref 4.0–10.5)

## 2012-02-11 LAB — COMPREHENSIVE METABOLIC PANEL
AST: 24 U/L (ref 0–37)
Albumin: 3.4 g/dL — ABNORMAL LOW (ref 3.5–5.2)
Alkaline Phosphatase: 83 U/L (ref 39–117)
BUN: 17 mg/dL (ref 6–23)
Chloride: 103 mEq/L (ref 96–112)
Potassium: 4.2 mEq/L (ref 3.5–5.1)
Total Bilirubin: 0.2 mg/dL — ABNORMAL LOW (ref 0.3–1.2)

## 2012-02-11 LAB — PHENYTOIN LEVEL, TOTAL: Phenytoin Lvl: 8.9 ug/mL — ABNORMAL LOW (ref 10.0–20.0)

## 2012-02-11 MED ORDER — LORAZEPAM 2 MG/ML IJ SOLN
1.0000 mg | Freq: Once | INTRAMUSCULAR | Status: AC
  Administered 2012-02-11: 1 mg via INTRAVENOUS
  Filled 2012-02-11: qty 1

## 2012-02-11 MED ORDER — PHENYTOIN SODIUM EXTENDED 100 MG PO CAPS
200.0000 mg | ORAL_CAPSULE | Freq: Once | ORAL | Status: AC
  Administered 2012-02-11: 200 mg via ORAL
  Filled 2012-02-11: qty 2

## 2012-02-11 NOTE — ED Notes (Signed)
[facility] notified of need for transport. 

## 2012-02-11 NOTE — ED Notes (Addendum)
Pt reports have some shaking in his legs tonight. Pt states he thinks he had a "mini seizure". Pt is alert and oriented x 3. Pt denies LOC, hitting his head and incontinence. Pt states he did take his Dilantin tonight

## 2012-02-11 NOTE — ED Notes (Signed)
Per EMS: pt coming from [facility]. Staff reported seizure like activity. EMS reports post-ictal. Pt is alert and oriented per norm. EMS gave no meds en route. No signs of incontinence. Seizure precautions applied.

## 2012-02-11 NOTE — ED Notes (Signed)
[facility] at bedside 

## 2012-02-11 NOTE — ED Notes (Signed)
Bed:WA15<BR> Expected date:<BR> Expected time:<BR> Means of arrival:<BR> Comments:<BR> EMS/seizure/post-ictal

## 2012-02-11 NOTE — ED Notes (Signed)
Attempted x2 to call report to [facility]. No answer received.

## 2012-02-11 NOTE — Discharge Instructions (Signed)
Epilepsy  A seizure (convulsion) is a sudden change in brain function that causes a change in behavior, muscle activity, or ability to remain awake and alert. If a person has recurring seizures, this is called epilepsy.  CAUSES   Epilepsy is a disorder with many possible causes. Anything that disturbs the normal pattern of brain cell activity can lead to seizures. Seizure can be caused from illness to brain damage to abnormal brain development. Epilepsy may develop because of:   An abnormality in brain wiring.   An imbalance of nerve signaling chemicals (neurotransmitters).   Some combination of these factors.  Scientists are learning an increasing amount about genetic causes of seizures.  SYMPTOMS   The symptoms of a seizure can vary greatly from one person to another. These may include:   An aura, or warning that tells a person they are about to have a seizure.   Abnormal sensations, such as abnormal smell or seeing flashing lights.   Sudden, general body stiffness.   Rhythmic jerking of the face, arm, or leg - on one or both sides.   Sudden change in consciousness.   The person may appear to be awake but not responding.   They may appear to be asleep but cannot be awakened.   Grimacing, chewing, lip smacking, or drooling.   Often there is a period of sleepiness after a seizure.  DIAGNOSIS   The description you give to your caregiver about what you experienced will help them understand your problems. Equally important is the description by any witnesses to your seizure. A physical exam, including a detailed neurological exam, is necessary. An EEG (electroencephalogram) is a painless test of your brain waves. In this test a diagram is created of your brain waves. These diagrams can be interpreted by a specialist. Pictures of your brain are usually taken with:   An MRI.   A CT scan.  Lab tests may be done to look for:   Signs of infection.   Abnormal blood chemistry.  PREVENTION   There is no way to  prevent the development of epilepsy. If you have seizures that are typically triggered by an event (such as flashing lights), try to avoid the trigger. This can help you avoid a seizure.   PROGNOSIS   Most people with epilepsy lead outwardly normal lives. While epilepsy cannot currently be cured, for some people it does eventually go away. Most seizures do not cause brain damage. It is not uncommon for people with epilepsy, especially children, to develop behavioral and emotional problems. These problems are sometimes the consequence of medicine for seizures or social stress. For some people with epilepsy, the risk of seizures restricts their independence and recreational activities. For example, some states refuse drivers licenses to people with epilepsy.  Most women with epilepsy can become pregnant. They should discuss their epilepsy and the medicine they are taking with their caregivers. Women with epilepsy have a 90 percent or better chance of having a normal, healthy baby.  RISKS AND COMPLICATIONS   People with epilepsy are at increased risk of falls, accidents, and injuries. People with epilepsy are at special risk for two life-threatening conditions. These are status epilepticus and sudden unexplained death (extremely rare). Status epilepticus is a long lasting, continuous seizure that is a medical emergency.  TREATMENT   Once epilepsy is diagnosed, it is important to begin treatment as soon as possible. For about 80 percent of those diagnosed with epilepsy, seizures can be controlled with modern medicines   and surgical techniques. Some antiepileptic drugs can interfere with the effectiveness of oral contraceptives. In 1997, the FDA approved a pacemaker for the brain the (vagus nerve stimulator). This stimulator can be used for people with seizures that are not well-controlled by medicine. Studies have shown that in some cases, children may experience fewer seizures if they maintain a strict diet. The strict  diet is called the ketogenic diet. This diet is rich in fats and low in carbohydrates.  HOME CARE INSTRUCTIONS    Your caregiver will make recommendations about driving and safety in normal activities. Follow these carefully.   Take any medicine prescribed exactly as directed.   Do any blood tests requested to monitor the levels of your medicine.   The people you live and work with should know that you are prone to seizures. They should receive instructions on how to help you. In general, a witness to a seizure should:   Cushion your head and body.   Turn you on your side.   Avoid unnecessarily restraining you.   Not place anything inside your mouth.   Call for local emergency medical help if there is any question about what has occurred.   Keep a seizure diary. Record what you recall about any seizure, especially any possible trigger.   If your caregiver has given you a follow-up appointment, it is very important to keep that appointment. Not keeping the appointment could result in permanent injury and disability. If there is any problem keeping the appointment, you must call back to this facility for assistance.  SEEK MEDICAL CARE IF:    You develop signs of infection or other illness. This might increase the risk of a seizure.   You seem to be having more frequent seizures.   Your seizure pattern is changing.  SEEK IMMEDIATE MEDICAL CARE IF:    A seizure does not stop after a few moments.   A seizure causes any difficulty in breathing.   A seizure results in a very severe headache.   A seizure leaves you with the inability to speak or use a part of your body.  MAKE SURE YOU:    Understand these instructions.   Will watch your condition.   Will get help right away if you are not doing well or get worse.  Document Released: 09/30/2005 Document Revised: 09/19/2011 Document Reviewed: 05/06/2008  [employer] Patient Information 2012 [employer].

## 2012-02-11 NOTE — ED Provider Notes (Signed)
Patient turned over to me pending a Dilantin level.  Patient's level is only slightly low at 8.9.  I will give him an additional 200 mg here and I believe he is safe for discharge back to his care facility.  Nat Christen, MD 02/11/12 [phone]

## 2012-02-11 NOTE — ED Provider Notes (Signed)
History     CSN: [id number]  Arrival date & time 02/11/12  0865   First MD Initiated Contact with Patient 02/11/12 0451      Chief Complaint  Patient presents with  . Seizures    (Consider location/radiation/quality/duration/timing/severity/associated sxs/prior treatment) The history is provided by the patient, the nursing home and the EMS personnel.   the patient presents from an assisted living center where the staff reported possible seizure like activity from the patient.  EMS reported the patient was postictal on arrival now the patient is alert and oriented and at baseline.  No medicines were given in route.  The patient reports he felt fine yesterday.  He reports he is compliant with his medications including his Dilantin.  He reports he got up to go the bathroom and felt his arms and legs shaking but was awake.  He denies head trauma.  He denies recent illness.  He has no complaints at this time.  Currently he reports he feels normal.  He denies tongue biting or urinary incontinence.  Nothing worsens the symptoms.  Nothing improves his symptoms  Past Medical History  Diagnosis Date  . Chronic pain   . Anxiety   . Schizoaffective disorder   . Hearing loss   . Seizure   . Arthritis   . Asthma   . Foot drop   . Anemia   . Dehydration   . Hypotension   . GERD (gastroesophageal reflux disease)   . Hypothyroidism   . Constipation   . Polysubstance abuse     History reviewed. No pertinent past surgical history.  No family history on file.  History  Substance Use Topics  . Smoking status: Never Smoker   . Smokeless tobacco: Not on file  . Alcohol Use: No      Review of Systems  Neurological: Positive for seizures.  All other systems reviewed and are negative.    Allergies  Fexofenadine hcl; Percocet; and Thorazine  Home Medications   Current Outpatient Rx  Name Route Sig Dispense Refill  . ACETAMINOPHEN-CODEINE #3 300-30 MG PO TABS Oral Take 1 tablet by  mouth every 6 (six) hours as needed. For pain.    Marland Kitchen ALPRAZOLAM 0.5 MG PO TABS Oral Take 0.5 mg by mouth daily at 12 noon.    . ARIPIPRAZOLE 5 MG PO TABS Oral Take 5 mg by mouth at bedtime.    Marland Kitchen BENZONATATE 100 MG PO CAPS Oral Take 100 mg by mouth every 8 (eight) hours.    Marland Kitchen BENZTROPINE MESYLATE 0.5 MG PO TABS Oral Take 0.5 mg by mouth 2 (two) times daily. Given 2 hours after thyroid medication    . DOXEPIN HCL 50 MG PO CAPS Oral Take 50 mg by mouth at bedtime.    Marland Kitchen FERROUS SULFATE 325 (65 FE) MG PO TABS Oral Take 325 mg by mouth daily with breakfast.    . FLUOXETINE HCL 20 MG PO CAPS Oral Take 20 mg by mouth daily.    Marland Kitchen FLUTICASONE-SALMETEROL 100-50 MCG/DOSE IN AEPB Inhalation Inhale 1 puff into the lungs 2 (two) times daily.    Marland Kitchen LEVOTHYROXINE SODIUM 75 MCG PO TABS Oral Take 75 mcg by mouth daily.    Marland Kitchen LORATADINE 10 MG PO TABS Oral Take 10 mg by mouth daily.    . MEGESTROL ACETATE 40 MG/ML PO SUSP Oral Take 400 mg by mouth 2 (two) times daily.     Marland Kitchen ENSURE PO Oral Take 1 Can by mouth 3 (three) times  daily with meals.    Marland Kitchen OLANZAPINE 15 MG PO TABS Oral Take 15 mg by mouth at bedtime.    . OMEPRAZOLE 40 MG PO CPDR Oral Take 40 mg by mouth daily.     Marland Kitchen PHENYTOIN SODIUM EXTENDED 100 MG PO CAPS Oral Take 300 mg by mouth at bedtime.    Marland Kitchen POLYETHYLENE GLYCOL 3350 PO POWD Oral Take 17 g by mouth daily.    Marland Kitchen SIMVASTATIN 10 MG PO TABS Oral Take 10 mg by mouth at bedtime.    . SOLIFENACIN SUCCINATE 5 MG PO TABS Oral Take 5 mg by mouth daily.     . TRAMADOL HCL 50 MG PO TABS Oral Take 50 mg by mouth every 6 (six) hours as needed.    . ALBUTEROL SULFATE HFA 108 (90 BASE) MCG/ACT IN AERS Inhalation Inhale 2 puffs into the lungs every 4 (four) hours as needed. For wheezing.      BP 128/78  Pulse 90  Temp 99.1 F (37.3 C)  Resp 16  SpO2 96%  Physical Exam  Nursing note and vitals reviewed. Constitutional: He is oriented to person, place, and time. He appears well-developed and well-nourished.    HENT:  Head: Normocephalic and atraumatic.  Eyes: EOM are normal. Pupils are equal, round, and reactive to light.  Neck: Normal range of motion.  Cardiovascular: Normal rate, regular rhythm, normal heart sounds and intact distal pulses.   Pulmonary/Chest: Effort normal and breath sounds normal. No respiratory distress.  Abdominal: Soft. He exhibits no distension. There is no tenderness.  Musculoskeletal: Normal range of motion.  Neurological: He is alert and oriented to person, place, and time.       5/5 strength in major muscle groups of  bilateral upper and lower extremities. Speech normal. No facial asymetry.   Skin: Skin is warm and dry.  Psychiatric: He has a normal mood and affect. Judgment normal.    ED Course  Procedures (including critical care time)  Labs Reviewed  CBC - Abnormal; Notable for the following:    WBC 12.4 (*)    RBC 3.69 (*)    Hemoglobin 11.9 (*)    HCT 34.9 (*)    All other components within normal limits  COMPREHENSIVE METABOLIC PANEL - Abnormal; Notable for the following:    Albumin 3.4 (*)    Total Bilirubin 0.2 (*)    GFR calc non Af Amer 78 (*)    GFR calc Af Amer 90 (*)    All other components within normal limits  PHENYTOIN LEVEL, TOTAL   No results found.   No diagnosis found.    MDM  Is unclear what actually happened.  Obtain basic labs and a Dilantin level.  The patient is well-appearing at this time.  He has a normal neurologic exam.   Care to on coming physician to follow up on dilantin level       Lyanne Co, MD 02/11/12 [phone]

## 2012-06-02 ENCOUNTER — Emergency Department (HOSPITAL_COMMUNITY): Payer: Medicare Other

## 2012-06-02 ENCOUNTER — Emergency Department (HOSPITAL_COMMUNITY)
Admission: EM | Admit: 2012-06-02 | Discharge: 2012-06-03 | Disposition: A | Discharge status: Home / Self Care | Payer: Medicare Other | Attending: Emergency Medicine | Admitting: Emergency Medicine

## 2012-06-02 DIAGNOSIS — R059 Cough, unspecified: Secondary | ICD-10-CM | POA: Insufficient documentation

## 2012-06-02 DIAGNOSIS — K219 Gastro-esophageal reflux disease without esophagitis: Secondary | ICD-10-CM | POA: Insufficient documentation

## 2012-06-02 DIAGNOSIS — E039 Hypothyroidism, unspecified: Secondary | ICD-10-CM | POA: Insufficient documentation

## 2012-06-02 DIAGNOSIS — R05 Cough: Secondary | ICD-10-CM

## 2012-06-02 LAB — CBC
MCH: 30.9 pg (ref 26.0–34.0)
MCV: 91.9 fL (ref 78.0–100.0)
Platelets: 329 10*3/uL (ref 150–400)
RDW: 13 % (ref 11.5–15.5)

## 2012-06-02 MED ORDER — GI COCKTAIL ~~LOC~~
30.0000 mL | Freq: Once | ORAL | Status: AC
  Administered 2012-06-02: 30 mL via ORAL
  Filled 2012-06-02: qty 30

## 2012-06-02 NOTE — ED Notes (Signed)
Pt brought from [facility] by [facility] C/O acid reflux that has not been relieved by his regular medications.  Pt also c/o coughing up phlegm.

## 2012-06-02 NOTE — ED Notes (Signed)
Pt has been coughing up "yellow gunk" for two weeks.  He asked to come here b/c [facility] does not have cough syrup.  Denies stomach pain at this time.

## 2012-06-03 MED ORDER — BENZONATATE 100 MG PO CAPS: 100.0000 mg | ORAL_CAPSULE | Freq: Three times a day (TID) | ORAL | Status: AC

## 2012-06-03 MED ORDER — SUCRALFATE 1 G PO TABS: 1.0000 g | ORAL_TABLET | Freq: Four times a day (QID) | ORAL | Status: DC

## 2012-06-03 NOTE — ED Notes (Signed)
Placed call to [facility] for transport back home 

## 2012-06-03 NOTE — ED Notes (Signed)
[facility] contacted to transport patient back to [facility].

## 2012-06-03 NOTE — ED Provider Notes (Signed)
History     CSN: [id number]  Arrival date & time 06/02/12  2021   First MD Initiated Contact with Patient 06/02/12 2308      Chief Complaint  Patient presents with  . Gastrophageal Reflux  . Cough    (Consider location/radiation/quality/duration/timing/severity/associated sxs/prior treatment) HPI 41 yo malepresents to emergency department complaining of acid reflux symptoms. Patient reports he takes a morning medicine for it, but he has symptoms throughout the day. Patient denies any chest pain only burning in his throat and stomach. He does not notice worsening with change in diet. Patient also complaining of persistent cough. Patient is a smoker. Cough is worse first thing in the morning but is persistent through the day. He denies any fevers. Cough is productive of yellow sputum. Patient has history of asthma, uses an inhaler. He denies any worsening wheezing or shortness of breath.  Past Medical History  Diagnosis Date  . Chronic pain   . Anxiety   . Schizoaffective disorder   . Hearing loss   . Seizure   . Arthritis   . Asthma   . Foot drop   . Anemia   . Dehydration   . Hypotension   . GERD (gastroesophageal reflux disease)   . Hypothyroidism   . Constipation   . Polysubstance abuse     No past surgical history on file.  No family history on file.  History  Substance Use Topics  . Smoking status: Never Smoker   . Smokeless tobacco: Not on file  . Alcohol Use: No      Review of Systems  All other systems reviewed and are negative.    Allergies  Fexofenadine hcl; Percocet; and Thorazine  Home Medications   Current Outpatient Rx  Name Route Sig Dispense Refill  . ACETAMINOPHEN-CODEINE #3 300-30 MG PO TABS Oral Take 1 tablet by mouth every 6 (six) hours as needed. For pain.    . ALBUTEROL SULFATE HFA 108 (90 BASE) MCG/ACT IN AERS Inhalation Inhale 2 puffs into the lungs every 4 (four) hours as needed. For wheezing.    Marland Kitchen ALPRAZOLAM 0.5 MG PO TABS Oral  Take 0.5 mg by mouth daily at 12 noon.    . ARIPIPRAZOLE 5 MG PO TABS Oral Take 5 mg by mouth at bedtime.    Marland Kitchen BENZTROPINE MESYLATE 0.5 MG PO TABS Oral Take 0.5 mg by mouth 2 (two) times daily. Given 2 hours after thyroid medication    . DOXEPIN HCL 50 MG PO CAPS Oral Take 50 mg by mouth at bedtime.    Marland Kitchen FERROUS SULFATE 325 (65 FE) MG PO TABS Oral Take 325 mg by mouth daily with breakfast.    . FLUOXETINE HCL 20 MG PO CAPS Oral Take 20 mg by mouth daily.    Marland Kitchen FLUTICASONE-SALMETEROL 100-50 MCG/DOSE IN AEPB Inhalation Inhale 1 puff into the lungs 2 (two) times daily.    Marland Kitchen LEVOTHYROXINE SODIUM 75 MCG PO TABS Oral Take 75 mcg by mouth daily.    Marland Kitchen LORATADINE 10 MG PO TABS Oral Take 10 mg by mouth daily.    . MEGESTROL ACETATE 40 MG/ML PO SUSP Oral Take 400 mg by mouth 2 (two) times daily.     Marland Kitchen ENSURE PO Oral Take 1 Can by mouth 3 (three) times daily with meals.    Marland Kitchen OLANZAPINE 15 MG PO TABS Oral Take 15 mg by mouth at bedtime.    . OMEPRAZOLE 40 MG PO CPDR Oral Take 40 mg by mouth daily.     Marland Kitchen  PHENYTOIN SODIUM EXTENDED 100 MG PO CAPS Oral Take 300 mg by mouth at bedtime.    Marland Kitchen POLYETHYLENE GLYCOL 3350 PO POWD Oral Take 17 g by mouth daily.    Marland Kitchen SIMVASTATIN 10 MG PO TABS Oral Take 10 mg by mouth at bedtime.    . SOLIFENACIN SUCCINATE 5 MG PO TABS Oral Take 5 mg by mouth daily.     . TESTOSTERONE 40.5 MG/2.5GM (1.62%) TD GEL Transdermal Place 2 application onto the skin every morning.    Marland Kitchen TRAMADOL HCL 50 MG PO TABS Oral Take 50 mg by mouth every 6 (six) hours as needed.    Marland Kitchen ZOLPIDEM TARTRATE 5 MG PO TABS Oral Take 5 mg by mouth at bedtime.      BP 112/97  Pulse 85  Temp 98.3 F (36.8 C) (Oral)  Resp 20  SpO2 99%  Physical Exam  Nursing note and vitals reviewed. Constitutional: He is oriented to person, place, and time. He appears well-developed.       Thin appearing  HENT:  Head: Normocephalic and atraumatic.  Nose: Nose normal.  Mouth/Throat: Oropharynx is clear and moist.  Eyes:  Conjunctivae and EOM are normal. Pupils are equal, round, and reactive to light.  Neck: Normal range of motion. Neck supple. No JVD present. No tracheal deviation present. No thyromegaly present.  Cardiovascular: Normal rate, regular rhythm, normal heart sounds and intact distal pulses.  Exam reveals no gallop and no friction rub.   No murmur heard. Pulmonary/Chest: Effort normal and breath sounds normal. No stridor. No respiratory distress. He has no wheezes. He has no rales. He exhibits no tenderness.  Abdominal: Soft. Bowel sounds are normal. He exhibits no distension and no mass. There is no tenderness. There is no rebound and no guarding.  Musculoskeletal: Normal range of motion. He exhibits no edema and no tenderness.  Lymphadenopathy:    He has no cervical adenopathy.  Neurological: He is alert and oriented to person, place, and time. He exhibits normal muscle tone. Coordination normal.  Skin: Skin is warm and dry. No rash noted. No erythema. No pallor.  Psychiatric: He has a normal mood and affect. His behavior is normal. Judgment and thought content normal.    ED Course  Procedures (including critical care time)  Labs Reviewed  CBC - Abnormal; Notable for the following:    WBC 11.1 (*)     RBC 4.08 (*)     Hemoglobin 12.6 (*)     HCT 37.5 (*)     All other components within normal limits   Dg Chest 2 View  06/02/2012  *RADIOLOGY REPORT*  Clinical Data: 41 year old male with cough, gastroesophageal reflux.  CHEST - 2 VIEW  Comparison: 01/28/2012 and earlier.  Findings: Chronic somewhat large lung volumes are stable.  Chronic increased linear opacity about the right hilum, most resembles scarring.  Suspect upper lobe emphysema.  No pneumothorax.  No pulmonary edema or pleural effusion.  Cardiac size and mediastinal contours are within normal limits.  Visualized tracheal air column is within normal limits.  No acute osseous abnormality identified.  IMPRESSION: Stable chronic lung  disease.   Original Report Authenticated By: Ulla Potash III, M.D.      1. GERD (gastroesophageal reflux disease)   2. Cough       MDM  Will d/c with rx for carafate in addition to his prilosec, add on tessalon pearls and referral to local pcm.        Olivia Mackie, MD 06/03/12  0012 

## 2012-07-23 ENCOUNTER — Emergency Department (HOSPITAL_COMMUNITY)
Admission: EM | Admit: 2012-07-23 | Discharge: 2012-07-23 | Disposition: A | Discharge status: Home / Self Care | Payer: Medicare Other | Attending: Emergency Medicine | Admitting: Emergency Medicine

## 2012-07-23 ENCOUNTER — Emergency Department (HOSPITAL_COMMUNITY): Payer: Medicare Other

## 2012-07-23 DIAGNOSIS — Z888 Allergy status to other drugs, medicaments and biological substances status: Secondary | ICD-10-CM | POA: Insufficient documentation

## 2012-07-23 DIAGNOSIS — K219 Gastro-esophageal reflux disease without esophagitis: Secondary | ICD-10-CM | POA: Insufficient documentation

## 2012-07-23 DIAGNOSIS — J40 Bronchitis, not specified as acute or chronic: Secondary | ICD-10-CM | POA: Insufficient documentation

## 2012-07-23 DIAGNOSIS — F259 Schizoaffective disorder, unspecified: Secondary | ICD-10-CM | POA: Insufficient documentation

## 2012-07-23 DIAGNOSIS — Z885 Allergy status to narcotic agent status: Secondary | ICD-10-CM | POA: Insufficient documentation

## 2012-07-23 DIAGNOSIS — J45909 Unspecified asthma, uncomplicated: Secondary | ICD-10-CM | POA: Insufficient documentation

## 2012-07-23 DIAGNOSIS — F411 Generalized anxiety disorder: Secondary | ICD-10-CM | POA: Insufficient documentation

## 2012-07-23 DIAGNOSIS — G8929 Other chronic pain: Secondary | ICD-10-CM | POA: Insufficient documentation

## 2012-07-23 MED ORDER — BENZONATATE 100 MG PO CAPS
100.0000 mg | ORAL_CAPSULE | Freq: Three times a day (TID) | ORAL | Status: DC
Start: 2012-07-23 — End: 2013-03-01

## 2012-07-23 MED ORDER — ALBUTEROL SULFATE HFA 108 (90 BASE) MCG/ACT IN AERS: 1.0000 | INHALATION_SPRAY | Freq: Four times a day (QID) | RESPIRATORY_TRACT | Status: DC | PRN

## 2012-07-23 MED ORDER — PREDNISONE 10 MG PO TABS: ORAL_TABLET | ORAL | Status: DC

## 2012-07-23 NOTE — ED Provider Notes (Signed)
History     CSN: [id number]  Arrival date & time 07/23/12  1606   First MD Initiated Contact with Patient 07/23/12 1917      Chief Complaint  Patient presents with  . Cough    (Consider location/radiation/quality/duration/timing/severity/associated sxs/prior treatment) HPI Comments: [name] [name] is a 41 y.o. Male who presents with complaint of cough, congestion, sore throat for a week. States white thick phlegm for a month. States chills, did not measure temp. Hx of the same. States "i just dont feel well."  Pt is very hard of hearing, does not read lips well and does not know sign language, language barrier present.    Past Medical History  Diagnosis Date  . Chronic pain   . Anxiety   . Schizoaffective disorder   . Hearing loss   . Seizure   . Arthritis   . Asthma   . Foot drop   . Anemia   . Dehydration   . Hypotension   . GERD (gastroesophageal reflux disease)   . Hypothyroidism   . Constipation   . Polysubstance abuse     No past surgical history on file.  No family history on file.  History  Substance Use Topics  . Smoking status: Never Smoker   . Smokeless tobacco: Not on file  . Alcohol Use: No      Review of Systems  Constitutional: Negative for fever and chills.  HENT: Positive for congestion and sore throat. Negative for neck pain and neck stiffness.   Respiratory: Positive for cough. Negative for chest tightness and shortness of breath.   Cardiovascular: Negative.   Gastrointestinal: Negative.   Genitourinary: Negative.   Musculoskeletal: Negative.   Neurological: Negative.     Allergies  Fexofenadine hcl; Percocet; and Thorazine  Home Medications   Current Outpatient Rx  Name Route Sig Dispense Refill  . ACETAMINOPHEN 325 MG PO TABS Oral Take 650 mg by mouth every 6 (six) hours as needed. Pain    . ACETAMINOPHEN-CODEINE #3 300-30 MG PO TABS Oral Take 1 tablet by mouth every 6 (six) hours as needed. For pain.    . ALBUTEROL SULFATE  HFA 108 (90 BASE) MCG/ACT IN AERS Inhalation Inhale 2 puffs into the lungs every 4 (four) hours as needed. For wheezing.    Marland Kitchen ALPRAZOLAM 0.5 MG PO TABS Oral Take 0.5 mg by mouth daily at 12 noon.    Marland Kitchen BENZTROPINE MESYLATE 0.5 MG PO TABS Oral Take 0.5 mg by mouth 2 (two) times daily. Given 2 hours after thyroid medication    . DOXEPIN HCL 50 MG PO CAPS Oral Take 50 mg by mouth at bedtime.    Marland Kitchen FERROUS SULFATE 325 (65 FE) MG PO TABS Oral Take 325 mg by mouth daily with breakfast.    . FLUOXETINE HCL 20 MG PO CAPS Oral Take 20 mg by mouth daily.    Marland Kitchen FLUTICASONE-SALMETEROL 100-50 MCG/DOSE IN AEPB Inhalation Inhale 1 puff into the lungs 2 (two) times daily.    Marland Kitchen LEVOTHYROXINE SODIUM 75 MCG PO TABS Oral Take 75 mcg by mouth daily.    Marland Kitchen LORATADINE 10 MG PO TABS Oral Take 10 mg by mouth daily.    . MEGESTROL ACETATE 40 MG/ML PO SUSP Oral Take 400 mg by mouth 2 (two) times daily.     Marland Kitchen ENSURE PO Oral Take 1 Can by mouth 3 (three) times daily with meals.    Marland Kitchen OLANZAPINE 15 MG PO TABS Oral Take 15 mg by mouth at bedtime.    Marland Kitchen  OMEPRAZOLE 40 MG PO CPDR Oral Take 40 mg by mouth daily.     Marland Kitchen PHENYTOIN SODIUM EXTENDED 100 MG PO CAPS Oral Take 300 mg by mouth at bedtime.    Marland Kitchen POLYETHYLENE GLYCOL 3350 PO POWD Oral Take 17 g by mouth daily.    Marland Kitchen SIMVASTATIN 10 MG PO TABS Oral Take 10 mg by mouth at bedtime.    . SOLIFENACIN SUCCINATE 5 MG PO TABS Oral Take 5 mg by mouth daily.     . SUCRALFATE 1 G PO TABS Oral Take 1 g by mouth 4 (four) times daily.    . TRAMADOL HCL 50 MG PO TABS Oral Take 50 mg by mouth every 6 (six) hours as needed. Pain    . ZOLPIDEM TARTRATE 5 MG PO TABS Oral Take 5 mg by mouth at bedtime.      BP 115/69  Pulse 89  Temp 97.5 F (36.4 C) (Oral)  Resp 14  SpO2 100%  Physical Exam  Nursing note and vitals reviewed. Constitutional: He appears well-developed and well-nourished. No distress.  HENT:  Head: Normocephalic and atraumatic.  Right Ear: Tympanic membrane, external ear and  ear canal normal.  Left Ear: Tympanic membrane, external ear and ear canal normal.  Nose: Rhinorrhea present.  Mouth/Throat: Uvula is midline, oropharynx is clear and moist and mucous membranes are normal.  Eyes: Conjunctivae normal are normal.  Neck: Normal range of motion. Neck supple.  Cardiovascular: Normal rate, regular rhythm and normal heart sounds.   Pulmonary/Chest: Effort normal and breath sounds normal. No respiratory distress. He has no wheezes. He has no rales.  Abdominal: Soft. Bowel sounds are normal. He exhibits no distension. There is no tenderness. There is no rebound.  Musculoskeletal: He exhibits no edema.  Lymphadenopathy:    He has no cervical adenopathy.  Neurological: He is alert.  Skin: Skin is warm and dry. No rash noted.    ED Course  Procedures (including critical care time)  Labs Reviewed - No data to display Dg Chest 2 View  07/23/2012  *RADIOLOGY REPORT*  Clinical Data: Cough, weakness  CHEST - 2 VIEW  Comparison: 06/02/2012; 05/24/2011  Findings:  Grossly unchanged cardiac silhouette and mediastinal contours.  The lungs remain hyperexpanded with diffuse thickening of the pulmonary interstitium.  Linear heterogeneous opacities within the right mid lung are grossly unchanged.  No new focal airspace opacity.  No pleural effusion or pneumothorax.  Unchanged bones.  IMPRESSION: Hyperexpanded lungs without acute cardiopulmonary disease.   Original Report Authenticated By: Waynard Reeds, M.D.    Pt with URI symptoms. CXR negative. Hx of visits recently for same thing. Already taking albuterol, advair for breathing. Will add course of steroids. PT denies CP or SOb. VS normal. Will follow up with PCP for recheck in 2-3 days.  Filed Vitals:   07/23/12 2157  BP: 102/69  Pulse: 93  Temp: 98.4 F (36.9 C)  Resp: 16    1. Bronchitis       MDM          Lottie Mussel, PA 07/24/12 0113

## 2012-07-23 NOTE — ED Notes (Signed)
Pt present via EMS from [facility] with c/o n/v x 1 month.  Pt aaox3.  Pt amb with minimum assist

## 2012-07-23 NOTE — ED Notes (Signed)
Pt discharged by [facility]; transported by [facility]

## 2012-07-23 NOTE — ED Notes (Signed)
Pt verbalizes understanding 

## 2012-07-23 NOTE — ED Notes (Signed)
Pt reports "coughing and spitting up white stuff for a month"  Pt states "just not feeling well and I stay cold all the time."

## 2012-07-25 NOTE — ED Provider Notes (Signed)
Medical screening examination/treatment/procedure(s) were performed by non-physician practitioner and as supervising physician I was immediately available for consultation/collaboration.    Donivin Wirt R Elnore Cosens, MD 07/25/12 1651 

## 2013-03-01 ENCOUNTER — Emergency Department (HOSPITAL_COMMUNITY): Payer: Medicare Other

## 2013-03-01 ENCOUNTER — Encounter (HOSPITAL_COMMUNITY): Payer: Self-pay | Admitting: Emergency Medicine

## 2013-03-01 ENCOUNTER — Emergency Department (HOSPITAL_COMMUNITY)
Admission: EM | Admit: 2013-03-01 | Discharge: 2013-03-01 | Disposition: A | Discharge status: Skilled Nursing Facility | Payer: Medicare Other | Attending: Emergency Medicine | Admitting: Emergency Medicine

## 2013-03-01 DIAGNOSIS — D649 Anemia, unspecified: Secondary | ICD-10-CM | POA: Insufficient documentation

## 2013-03-01 DIAGNOSIS — R1110 Vomiting, unspecified: Secondary | ICD-10-CM

## 2013-03-01 DIAGNOSIS — H9190 Unspecified hearing loss, unspecified ear: Secondary | ICD-10-CM | POA: Insufficient documentation

## 2013-03-01 DIAGNOSIS — F259 Schizoaffective disorder, unspecified: Secondary | ICD-10-CM | POA: Insufficient documentation

## 2013-03-01 DIAGNOSIS — N390 Urinary tract infection, site not specified: Secondary | ICD-10-CM

## 2013-03-01 DIAGNOSIS — E039 Hypothyroidism, unspecified: Secondary | ICD-10-CM | POA: Insufficient documentation

## 2013-03-01 DIAGNOSIS — M129 Arthropathy, unspecified: Secondary | ICD-10-CM | POA: Insufficient documentation

## 2013-03-01 DIAGNOSIS — Z8739 Personal history of other diseases of the musculoskeletal system and connective tissue: Secondary | ICD-10-CM | POA: Insufficient documentation

## 2013-03-01 DIAGNOSIS — Z79899 Other long term (current) drug therapy: Secondary | ICD-10-CM | POA: Insufficient documentation

## 2013-03-01 DIAGNOSIS — R112 Nausea with vomiting, unspecified: Secondary | ICD-10-CM | POA: Insufficient documentation

## 2013-03-01 DIAGNOSIS — F411 Generalized anxiety disorder: Secondary | ICD-10-CM | POA: Insufficient documentation

## 2013-03-01 DIAGNOSIS — G8929 Other chronic pain: Secondary | ICD-10-CM | POA: Insufficient documentation

## 2013-03-01 DIAGNOSIS — K219 Gastro-esophageal reflux disease without esophagitis: Secondary | ICD-10-CM | POA: Insufficient documentation

## 2013-03-01 DIAGNOSIS — J45909 Unspecified asthma, uncomplicated: Secondary | ICD-10-CM | POA: Insufficient documentation

## 2013-03-01 DIAGNOSIS — G40909 Epilepsy, unspecified, not intractable, without status epilepticus: Secondary | ICD-10-CM | POA: Insufficient documentation

## 2013-03-01 LAB — URINE MICROSCOPIC-ADD ON

## 2013-03-01 LAB — POCT I-STAT TROPONIN I: Troponin i, poc: 0.01 ng/mL (ref 0.00–0.08)

## 2013-03-01 LAB — URINALYSIS, ROUTINE W REFLEX MICROSCOPIC
Glucose, UA: NEGATIVE mg/dL
Protein, ur: 30 mg/dL — AB
pH: 6.5 (ref 5.0–8.0)

## 2013-03-01 LAB — CBC WITH DIFFERENTIAL/PLATELET
Basophils Absolute: 0.1 10*3/uL (ref 0.0–0.1)
Basophils Relative: 1 % (ref 0–1)
Eosinophils Relative: 1 % (ref 0–5)
Lymphocytes Relative: 15 % (ref 12–46)
MCHC: 34.8 g/dL (ref 30.0–36.0)
Neutro Abs: 9.1 10*3/uL — ABNORMAL HIGH (ref 1.7–7.7)
Platelets: 337 10*3/uL (ref 150–400)
RDW: 13.5 % (ref 11.5–15.5)
WBC: 12.2 10*3/uL — ABNORMAL HIGH (ref 4.0–10.5)

## 2013-03-01 LAB — COMPREHENSIVE METABOLIC PANEL
ALT: 6 U/L (ref 0–53)
AST: 11 U/L (ref 0–37)
Albumin: 2.8 g/dL — ABNORMAL LOW (ref 3.5–5.2)
CO2: 26 mEq/L (ref 19–32)
Calcium: 8.1 mg/dL — ABNORMAL LOW (ref 8.4–10.5)
Chloride: 97 mEq/L (ref 96–112)
GFR calc non Af Amer: >90 mL/min (ref >90)
Sodium: 134 mEq/L — ABNORMAL LOW (ref 135–145)

## 2013-03-01 LAB — RAPID URINE DRUG SCREEN, HOSP PERFORMED
Amphetamines: NOT DETECTED
Benzodiazepines: NOT DETECTED
Cocaine: NOT DETECTED
Opiates: NOT DETECTED

## 2013-03-01 LAB — ETHANOL: Alcohol, Ethyl (B): <11 mg/dL (ref 0–11)

## 2013-03-01 LAB — PHENYTOIN LEVEL, TOTAL: Phenytoin Lvl: 9.1 ug/mL — ABNORMAL LOW (ref 10.0–20.0)

## 2013-03-01 MED ORDER — CIPROFLOXACIN HCL 500 MG PO TABS
500.0000 mg | ORAL_TABLET | Freq: Once | ORAL | Status: AC
  Administered 2013-03-01: 500 mg via ORAL
  Filled 2013-03-01: qty 1

## 2013-03-01 MED ORDER — ONDANSETRON HCL 4 MG/2ML IJ SOLN
4.0000 mg | Freq: Once | INTRAMUSCULAR | Status: AC
  Administered 2013-03-01: 4 mg via INTRAVENOUS
  Filled 2013-03-01: qty 2

## 2013-03-01 MED ORDER — POTASSIUM CHLORIDE CRYS ER 20 MEQ PO TBCR
60.0000 meq | EXTENDED_RELEASE_TABLET | Freq: Once | ORAL | Status: AC
  Administered 2013-03-01: 60 meq via ORAL
  Filled 2013-03-01: qty 3

## 2013-03-01 MED ORDER — CIPROFLOXACIN HCL 500 MG PO TABS: 500.0000 mg | ORAL_TABLET | Freq: Two times a day (BID) | ORAL | Status: DC

## 2013-03-01 MED ORDER — SODIUM CHLORIDE 0.9 % IV BOLUS (SEPSIS)
1000.0000 mL | Freq: Once | INTRAVENOUS | Status: AC
  Administered 2013-03-01: 1000 mL via INTRAVENOUS

## 2013-03-01 NOTE — ED Notes (Signed)
Attempted I&O cath x1, unsuccessful, x1. RN [place] notified. States we will try after he has gotten fluid

## 2013-03-01 NOTE — ED Notes (Signed)
Attempted to in and out cath pt x2 and was unsuccessful due to resistance.  Pt is continent.  Gave pt urinal and will continue to monitor pt or urine specimen.

## 2013-03-01 NOTE — ED Notes (Signed)
Patient transported to X-ray 

## 2013-03-01 NOTE — ED Notes (Signed)
PER EMS- pt picked up from [facility] with c/o weakness and emesis x1 day. Reports pt is hypoglycemic, current cbg 90.   Pt is alert.  Pt has hx of htn, seizures, and anemia.

## 2013-03-01 NOTE — ED Provider Notes (Addendum)
History     CSN: [id number]  Arrival date & time 03/01/13  1322   First MD Initiated Contact with Patient 03/01/13 1456      Chief Complaint  Patient presents with  . Emesis  . Weakness    (Consider location/radiation/quality/duration/timing/severity/associated sxs/prior treatment) HPI Comments: Patient is brought in by EMS from our care. Per staff he's had nausea and vomiting times one day. He stated his blood sugar was low at the facility however it was reported to be 75. The facility reported that he's had generalized weakness as well. Other history is extremely limited as the patient is not answering questions.  Patient is a 42 y.o. male presenting with vomiting and weakness.  Emesis Weakness    Past Medical History  Diagnosis Date  . Chronic pain   . Anxiety   . Schizoaffective disorder   . Hearing loss   . Seizure   . Arthritis   . Asthma   . Foot drop   . Anemia   . Dehydration   . Hypotension   . GERD (gastroesophageal reflux disease)   . Hypothyroidism   . Constipation   . Polysubstance abuse     History reviewed. No pertinent past surgical history.  No family history on file.  History  Substance Use Topics  . Smoking status: Never Smoker   . Smokeless tobacco: Not on file  . Alcohol Use: No      Review of Systems  Unable to perform ROS Gastrointestinal: Positive for vomiting.  Neurological: Positive for weakness.    Allergies  Fexofenadine hcl; Percocet; and Thorazine  Home Medications   Current Outpatient Rx  Name  Route  Sig  Dispense  Refill  . acetaminophen (TYLENOL) 325 MG tablet   Oral   Take 650 mg by mouth every 6 (six) hours as needed. Pain         . albuterol (PROVENTIL HFA;VENTOLIN HFA) 108 (90 BASE) MCG/ACT inhaler   Inhalation   Inhale 2 puffs into the lungs every 4 (four) hours as needed. For wheezing.         Marland Kitchen ALPRAZolam (XANAX) 0.5 MG tablet   Oral   Take 0.5 mg by mouth daily at 12 noon.         .  benztropine (COGENTIN) 0.5 MG tablet   Oral   Take 0.5 mg by mouth 2 (two) times daily. Given 2 hours after thyroid medication         . doxepin (SINEQUAN) 50 MG capsule   Oral   Take 50 mg by mouth at bedtime.         . ferrous sulfate 325 (65 FE) MG tablet   Oral   Take 325 mg by mouth daily with breakfast.         . FLUoxetine (PROZAC) 20 MG capsule   Oral   Take 20 mg by mouth daily.         . Fluticasone-Salmeterol (ADVAIR) 100-50 MCG/DOSE AEPB   Inhalation   Inhale 1 puff into the lungs 2 (two) times daily.         Marland Kitchen guaifenesin (ROBITUSSIN) 100 MG/5ML syrup   Oral   Take 200 mg by mouth every 4 (four) hours as needed for cough.         . levothyroxine (SYNTHROID, LEVOTHROID) 75 MCG tablet   Oral   Take 75 mcg by mouth daily.         Marland Kitchen loratadine (CLARITIN) 10 MG tablet   Oral  Take 10 mg by mouth daily.         . megestrol (MEGACE) 40 MG/ML suspension   Oral   Take 400 mg by mouth 2 (two) times daily.          . mirtazapine (REMERON) 15 MG tablet   Oral   Take 15 mg by mouth at bedtime.         . Nutritional Supplements (ENSURE PO)   Oral   Take 1 Can by mouth 3 (three) times daily with meals.         Marland Kitchen OLANZapine (ZYPREXA) 15 MG tablet   Oral   Take 15 mg by mouth at bedtime.         Marland Kitchen omeprazole (PRILOSEC) 40 MG capsule   Oral   Take 40 mg by mouth daily.          . phenytoin (DILANTIN) 100 MG ER capsule   Oral   Take 300 mg by mouth at bedtime.         . polyethylene glycol powder (GLYCOLAX/MIRALAX) powder   Oral   Take 17 g by mouth daily.         . simvastatin (ZOCOR) 10 MG tablet   Oral   Take 10 mg by mouth at bedtime.         . solifenacin (VESICARE) 5 MG tablet   Oral   Take 5 mg by mouth daily.          . sucralfate (CARAFATE) 1 G tablet   Oral   Take 1 g by mouth 4 (four) times daily.         Marland Kitchen zolpidem (AMBIEN) 5 MG tablet   Oral   Take 5 mg by mouth at bedtime.         .  ciprofloxacin (CIPRO) 500 MG tablet   Oral   Take 1 tablet (500 mg total) by mouth 2 (two) times daily. One po bid x 7 days   14 tablet   0     BP 111/71  Pulse 94  Temp(Src) 98.8 F (37.1 C) (Rectal)  Resp 23  SpO2 97%  Physical Exam  Constitutional: He is oriented to person, place, and time. He appears well-developed and well-nourished.  Patient is sleepy but opens his eyes to verbal response  HENT:  Head: Normocephalic and atraumatic.  Slightly dry mucous membranes  Eyes: Conjunctivae are normal. Pupils are equal, round, and reactive to light.  Neck: Normal range of motion. Neck supple.  Cardiovascular: Normal rate, regular rhythm and normal heart sounds.   Pulmonary/Chest: Effort normal and breath sounds normal. No respiratory distress. He has no wheezes. He has no rales. He exhibits no tenderness.  Abdominal: Soft. Bowel sounds are normal. There is no tenderness. There is no rebound and no guarding.  Musculoskeletal: Normal range of motion. He exhibits no edema.  Lymphadenopathy:    He has no cervical adenopathy.  Neurological: He is alert and oriented to person, place, and time. He has normal strength. No cranial nerve deficit.  Patient will open his eyes and had some non-comprehensible verbal response. He will follow some simple commands and has symmetric grip strength in symmetric movement in his legs. There's no obvious facial droop  Skin: Skin is warm and dry. No rash noted.  Psychiatric: He has a normal mood and affect.    ED Course  Procedures (including critical care time)  Results for orders placed during the hospital encounter of 03/01/13  GLUCOSE, CAPILLARY  Result Value Range   Glucose-Capillary 90  70 - 99 mg/dL  CBC WITH DIFFERENTIAL      Result Value Range   WBC 12.2 (*) 4.0 - 10.5 K/uL   RBC 3.62 (*) 4.22 - 5.81 MIL/uL   Hemoglobin 11.0 (*) 13.0 - 17.0 g/dL   HCT 16.1 (*) 09.6 - 04.5 %   MCV 87.3  78.0 - 100.0 fL   MCH 30.4  26.0 - 34.0 pg    MCHC 34.8  30.0 - 36.0 g/dL   RDW 40.9  81.1 - 91.4 %   Platelets 337  150 - 400 K/uL   Neutrophils Relative % 75  43 - 77 %   Neutro Abs 9.1 (*) 1.7 - 7.7 K/uL   Lymphocytes Relative 15  12 - 46 %   Lymphs Abs 1.8  0.7 - 4.0 K/uL   Monocytes Relative 9  3 - 12 %   Monocytes Absolute 1.1 (*) 0.1 - 1.0 K/uL   Eosinophils Relative 1  0 - 5 %   Eosinophils Absolute 0.1  0.0 - 0.7 K/uL   Basophils Relative 1  0 - 1 %   Basophils Absolute 0.1  0.0 - 0.1 K/uL  COMPREHENSIVE METABOLIC PANEL      Result Value Range   Sodium 134 (*) 135 - 145 mEq/L   Potassium 2.8 (*) 3.5 - 5.1 mEq/L   Chloride 97  96 - 112 mEq/L   CO2 26  19 - 32 mEq/L   Glucose, Bld 96  70 - 99 mg/dL   BUN 10  6 - 23 mg/dL   Creatinine, Ser 7.82  0.50 - 1.35 mg/dL   Calcium 8.1 (*) 8.4 - 10.5 mg/dL   Total Protein 6.4  6.0 - 8.3 g/dL   Albumin 2.8 (*) 3.5 - 5.2 g/dL   AST 11  0 - 37 U/L   ALT 6  0 - 53 U/L   Alkaline Phosphatase 77  39 - 117 U/L   Total Bilirubin 0.2 (*) 0.3 - 1.2 mg/dL   GFR calc non Af Amer >90  >90 mL/min   GFR calc Af Amer >90  >90 mL/min  LIPASE, BLOOD      Result Value Range   Lipase 17  11 - 59 U/L  URINALYSIS, ROUTINE W REFLEX MICROSCOPIC      Result Value Range   Color, Urine YELLOW  YELLOW   APPearance CLOUDY (*) CLEAR   Specific Gravity, Urine 1.009  1.005 - 1.030   pH 6.5  5.0 - 8.0   Glucose, UA NEGATIVE  NEGATIVE mg/dL   Hgb urine dipstick LARGE (*) NEGATIVE   Bilirubin Urine NEGATIVE  NEGATIVE   Ketones, ur NEGATIVE  NEGATIVE mg/dL   Protein, ur 30 (*) NEGATIVE mg/dL   Urobilinogen, UA 0.2  0.0 - 1.0 mg/dL   Nitrite NEGATIVE  NEGATIVE   Leukocytes, UA LARGE (*) NEGATIVE  ETHANOL      Result Value Range   Alcohol, Ethyl (B) <11  0 - 11 mg/dL  URINE RAPID DRUG SCREEN (HOSP PERFORMED)      Result Value Range   Opiates NONE DETECTED  NONE DETECTED   Cocaine NONE DETECTED  NONE DETECTED   Benzodiazepines NONE DETECTED  NONE DETECTED   Amphetamines NONE DETECTED  NONE  DETECTED   Tetrahydrocannabinol NONE DETECTED  NONE DETECTED   Barbiturates NONE DETECTED  NONE DETECTED  PHENYTOIN LEVEL, TOTAL      Result Value Range   Phenytoin Lvl 9.1 (*)  10.0 - 20.0 ug/mL  LACTIC ACID, PLASMA      Result Value Range   Lactic Acid, Venous 0.9  0.5 - 2.2 mmol/L  URINE MICROSCOPIC-ADD ON      Result Value Range   WBC, UA 11-20  <3 WBC/hpf   RBC / HPF TOO NUMEROUS TO COUNT  <3 RBC/hpf   Bacteria, UA MANY (*) RARE  POCT I-STAT TROPONIN I      Result Value Range   Troponin i, poc 0.01  0.00 - 0.08 ng/mL   Comment 3            Dg Chest 2 View  03/01/2013   *RADIOLOGY REPORT*  Clinical Data: Altered mental status  CHEST - 2 VIEW  Comparison: 07/23/2012  Findings: Cardiac shadow is stable.  Mild scarring is noted within the right lung.  No focal confluent infiltrate or sizable effusion is seen.  No bony abnormality is noted.  IMPRESSION: No acute abnormality seen.   Original Report Authenticated By: Alcide Clever, M.D.   Ct Head Wo Contrast  03/01/2013   *RADIOLOGY REPORT*  Clinical Data: Weakness, hypoglycemia, history of seizures, anemia, altered mental status  CT HEAD WITHOUT CONTRAST  Technique:  Contiguous axial images were obtained from the base of the skull through the vertex without contrast.  Comparison: None.  Findings: No skull fracture is noted.  The patient is status post lower posterior occipital craniotomy.  There is encephalomalacia in the right posterior inferior cerebellum.  Surgical clips are noted in this region.  No intracranial hemorrhage, mass effect or midline shift.  The gray and white matter differentiation is preserved.  No acute infarction.  No mass lesion is noted on this unenhanced scan.  IMPRESSION:  1.  The patient is status post occipital craniotomy. 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  No intracranial hemorrhage, mass effect or midline shift.  No acute cortical infarction.   Original Report Authenticated By: Natasha Mead,  M.D.    Date: 03/01/2013  Rate: 100  Rhythm: normal sinus rhythm  QRS Axis: normal  Intervals: normal  ST/T Wave abnormalities: nonspecific ST/T changes  Conduction Disutrbances:none  Narrative Interpretation:   Old EKG Reviewed: changes noted     1. Vomiting   2. UTI (lower urinary tract infection)       MDM  Patient is alert and oriented to surroundings. I contacted the nursing home and they stated he is very hard of hearing and therefore has a hard time answering questions at times. This seems to be his baseline mental status. The nursing home staff states that he is nonambulatory and gets around in a wheelchair. He is afebrile including has a normal rectal temp. His labs showed some hypokalemia and was given an extra dose of potassium here. He does have signs of a urinary tract infection and I will go ahead and treat him with Cipro for this. He has had no abdominal pain on exam. He has no focal neurologic deficits. He has no evidence of sepsis. He has no vomiting here in emergency department. No diarrhea. He was discharged back to the nursing home in good condition. He was advised to followup with his doctor within next few days or return here as needed for any worsening symptoms        Rolan Bucco, MD 03/01/13 9147  Rolan Bucco, MD 03/01/13 8295  Rolan Bucco, MD 03/01/13 2236

## 2013-03-01 NOTE — ED Notes (Signed)
Bed:WA25<BR> Expected date:<BR> Expected time:<BR> Means of arrival:<BR> Comments:<BR> Hypoglycemic

## 2013-03-01 NOTE — ED Notes (Signed)
Bladder scan done 200+ urine in bladder. Dr Fredderick Phenix notified the difficulties with I&O cath. Notified that patient is trying to urinate at this time in a urinal. Dr.Belfi said we could see if he could urinate and wasn't imperative to do a coude cath at this time.

## 2013-03-01 NOTE — ED Notes (Addendum)
Per telephone conversation with staff at [facility] and wife, reports pt has difficulty hearing in both ears.  Baseline speech is slow.  Pt is wheelchair bound, doesn't ambulate.  Responds well to written communication.  Wife's contact information [phone]

## 2013-03-02 LAB — URINE CULTURE: Colony Count: NO GROWTH

## 2013-03-09 ENCOUNTER — Inpatient Hospital Stay (HOSPITAL_COMMUNITY)
Admission: EM | Admit: 2013-03-09 | Discharge: 2013-03-15 | DRG: 641 | Disposition: A | Discharge status: Skilled Nursing Facility | Payer: Medicare Other | Attending: Family Medicine | Admitting: Family Medicine

## 2013-03-09 ENCOUNTER — Encounter (HOSPITAL_COMMUNITY): Payer: Self-pay | Admitting: Emergency Medicine

## 2013-03-09 ENCOUNTER — Emergency Department (HOSPITAL_COMMUNITY): Payer: Medicare Other

## 2013-03-09 DIAGNOSIS — Z79899 Other long term (current) drug therapy: Secondary | ICD-10-CM

## 2013-03-09 DIAGNOSIS — F259 Schizoaffective disorder, unspecified: Secondary | ICD-10-CM | POA: Diagnosis present

## 2013-03-09 DIAGNOSIS — K5900 Constipation, unspecified: Secondary | ICD-10-CM

## 2013-03-09 DIAGNOSIS — E876 Hypokalemia: Secondary | ICD-10-CM | POA: Diagnosis present

## 2013-03-09 DIAGNOSIS — E860 Dehydration: Principal | ICD-10-CM | POA: Diagnosis present

## 2013-03-09 DIAGNOSIS — F411 Generalized anxiety disorder: Secondary | ICD-10-CM | POA: Diagnosis present

## 2013-03-09 DIAGNOSIS — I959 Hypotension, unspecified: Secondary | ICD-10-CM

## 2013-03-09 DIAGNOSIS — A088 Other specified intestinal infections: Secondary | ICD-10-CM | POA: Diagnosis present

## 2013-03-09 DIAGNOSIS — E039 Hypothyroidism, unspecified: Secondary | ICD-10-CM | POA: Diagnosis present

## 2013-03-09 DIAGNOSIS — F1910 Other psychoactive substance abuse, uncomplicated: Secondary | ICD-10-CM

## 2013-03-09 DIAGNOSIS — J45909 Unspecified asthma, uncomplicated: Secondary | ICD-10-CM | POA: Diagnosis present

## 2013-03-09 DIAGNOSIS — G8929 Other chronic pain: Secondary | ICD-10-CM | POA: Diagnosis present

## 2013-03-09 DIAGNOSIS — D72829 Elevated white blood cell count, unspecified: Secondary | ICD-10-CM | POA: Diagnosis present

## 2013-03-09 DIAGNOSIS — R112 Nausea with vomiting, unspecified: Secondary | ICD-10-CM

## 2013-03-09 DIAGNOSIS — K219 Gastro-esophageal reflux disease without esophagitis: Secondary | ICD-10-CM | POA: Diagnosis present

## 2013-03-09 LAB — URINALYSIS, ROUTINE W REFLEX MICROSCOPIC
Ketones, ur: NEGATIVE mg/dL
Leukocytes, UA: NEGATIVE
Nitrite: NEGATIVE
Protein, ur: NEGATIVE mg/dL
Urobilinogen, UA: 1 mg/dL (ref 0.0–1.0)

## 2013-03-09 LAB — CBC WITH DIFFERENTIAL/PLATELET
Basophils Relative: 0 % (ref 0–1)
Eosinophils Absolute: 0.3 10*3/uL (ref 0.0–0.7)
HCT: 38.6 % — ABNORMAL LOW (ref 39.0–52.0)
Hemoglobin: 13.3 g/dL (ref 13.0–17.0)
Lymphs Abs: 2.5 10*3/uL (ref 0.7–4.0)
MCH: 30.2 pg (ref 26.0–34.0)
MCHC: 34.5 g/dL (ref 30.0–36.0)
MCV: 87.7 fL (ref 78.0–100.0)
Monocytes Absolute: 1.6 10*3/uL — ABNORMAL HIGH (ref 0.1–1.0)
Monocytes Relative: 8 % (ref 3–12)
Neutro Abs: 16 10*3/uL — ABNORMAL HIGH (ref 1.7–7.7)
Neutrophils Relative %: 78 % — ABNORMAL HIGH (ref 43–77)
RBC: 4.4 MIL/uL (ref 4.22–5.81)
RDW: 13.5 % (ref 11.5–15.5)

## 2013-03-09 LAB — COMPREHENSIVE METABOLIC PANEL
BUN: 6 mg/dL (ref 6–23)
CO2: 25 mEq/L (ref 19–32)
Chloride: 101 mEq/L (ref 96–112)
Creatinine, Ser: 0.92 mg/dL (ref 0.50–1.35)
GFR calc Af Amer: >90 mL/min (ref >90)
GFR calc non Af Amer: >90 mL/min (ref >90)
Total Bilirubin: 0.2 mg/dL — ABNORMAL LOW (ref 0.3–1.2)

## 2013-03-09 LAB — URINE MICROSCOPIC-ADD ON

## 2013-03-09 LAB — LIPASE, BLOOD: Lipase: 20 U/L (ref 11–59)

## 2013-03-09 LAB — RAPID URINE DRUG SCREEN, HOSP PERFORMED
Amphetamines: NOT DETECTED
Barbiturates: NOT DETECTED
Tetrahydrocannabinol: NOT DETECTED

## 2013-03-09 LAB — TSH: TSH: 0.876 u[IU]/mL (ref 0.350–4.500)

## 2013-03-09 MED ORDER — LORATADINE 10 MG PO TABS
10.0000 mg | ORAL_TABLET | Freq: Every day | ORAL | Status: DC
  Administered 2013-03-10 – 2013-03-15 (×6): 10 mg via ORAL
  Filled 2013-03-09 (×6): qty 1

## 2013-03-09 MED ORDER — PHENYTOIN SODIUM EXTENDED 100 MG PO CAPS
300.0000 mg | ORAL_CAPSULE | Freq: Every day | ORAL | Status: DC
  Administered 2013-03-10 – 2013-03-14 (×5): 300 mg via ORAL
  Filled 2013-03-09 (×6): qty 3

## 2013-03-09 MED ORDER — FLUOXETINE HCL 20 MG PO CAPS
20.0000 mg | ORAL_CAPSULE | Freq: Every day | ORAL | Status: DC
  Administered 2013-03-10 – 2013-03-15 (×6): 20 mg via ORAL
  Filled 2013-03-09 (×6): qty 1

## 2013-03-09 MED ORDER — POLYETHYLENE GLYCOL 3350 17 GM/SCOOP PO POWD
17.0000 g | Freq: Every day | ORAL | Status: DC
  Filled 2013-03-09 (×2): qty 255

## 2013-03-09 MED ORDER — FERROUS SULFATE 325 (65 FE) MG PO TABS
325.0000 mg | ORAL_TABLET | Freq: Every day | ORAL | Status: DC
  Administered 2013-03-10 – 2013-03-15 (×6): 325 mg via ORAL
  Filled 2013-03-09 (×8): qty 1

## 2013-03-09 MED ORDER — ALPRAZOLAM 0.5 MG PO TABS
0.5000 mg | ORAL_TABLET | Freq: Every day | ORAL | Status: DC
  Administered 2013-03-10 – 2013-03-15 (×6): 0.5 mg via ORAL
  Filled 2013-03-09 (×6): qty 1

## 2013-03-09 MED ORDER — ONDANSETRON HCL 4 MG/2ML IJ SOLN
4.0000 mg | Freq: Once | INTRAMUSCULAR | Status: AC
  Administered 2013-03-09: 4 mg via INTRAVENOUS
  Filled 2013-03-09: qty 2

## 2013-03-09 MED ORDER — ZOLPIDEM TARTRATE 5 MG PO TABS: 5.0000 mg | ORAL_TABLET | Freq: Every evening | ORAL | Status: DC | PRN

## 2013-03-09 MED ORDER — TRAMADOL HCL 50 MG PO TABS
50.0000 mg | ORAL_TABLET | Freq: Four times a day (QID) | ORAL | Status: DC | PRN
  Administered 2013-03-10 – 2013-03-11 (×3): 50 mg via ORAL
  Filled 2013-03-09 (×3): qty 1

## 2013-03-09 MED ORDER — ENOXAPARIN SODIUM 30 MG/0.3ML ~~LOC~~ SOLN
30.0000 mg | SUBCUTANEOUS | Status: DC
  Administered 2013-03-09 – 2013-03-14 (×6): 30 mg via SUBCUTANEOUS
  Filled 2013-03-09 (×7): qty 0.3

## 2013-03-09 MED ORDER — MIRTAZAPINE 15 MG PO TABS
15.0000 mg | ORAL_TABLET | Freq: Every evening | ORAL | Status: DC | PRN
Start: 2013-03-09 — End: 2013-03-15
  Filled 2013-03-09: qty 1

## 2013-03-09 MED ORDER — MORPHINE SULFATE 4 MG/ML IJ SOLN
4.0000 mg | Freq: Once | INTRAMUSCULAR | Status: AC
  Administered 2013-03-09: 4 mg via INTRAVENOUS
  Filled 2013-03-09: qty 1

## 2013-03-09 MED ORDER — GUAIFENESIN 100 MG/5ML PO SOLN: 10.0000 mL | ORAL | Status: DC | PRN

## 2013-03-09 MED ORDER — SODIUM CHLORIDE 0.9 % IJ SOLN
3.0000 mL | Freq: Two times a day (BID) | INTRAMUSCULAR | Status: DC
  Administered 2013-03-14 – 2013-03-15 (×4): 3 mL via INTRAVENOUS

## 2013-03-09 MED ORDER — BENZTROPINE MESYLATE 0.5 MG PO TABS
0.5000 mg | ORAL_TABLET | Freq: Two times a day (BID) | ORAL | Status: DC
  Filled 2013-03-09: qty 1

## 2013-03-09 MED ORDER — HYDROMORPHONE HCL PF 1 MG/ML IJ SOLN: 1.0000 mg | INTRAMUSCULAR | Status: DC | PRN

## 2013-03-09 MED ORDER — SODIUM CHLORIDE 0.9 % IV BOLUS (SEPSIS): 1000.0000 mL | Freq: Once | INTRAVENOUS | Status: DC

## 2013-03-09 MED ORDER — MEGESTROL ACETATE 40 MG/ML PO SUSP
400.0000 mg | Freq: Two times a day (BID) | ORAL | Status: DC
  Administered 2013-03-10 – 2013-03-15 (×11): 400 mg via ORAL
  Filled 2013-03-09 (×14): qty 10

## 2013-03-09 MED ORDER — LEVOTHYROXINE SODIUM 75 MCG PO TABS
75.0000 ug | ORAL_TABLET | Freq: Every day | ORAL | Status: DC
  Administered 2013-03-10 – 2013-03-15 (×6): 75 ug via ORAL
  Filled 2013-03-09 (×7): qty 1

## 2013-03-09 MED ORDER — SUCRALFATE 1 G PO TABS
1.0000 g | ORAL_TABLET | Freq: Four times a day (QID) | ORAL | Status: DC
  Administered 2013-03-10 – 2013-03-15 (×19): 1 g via ORAL
  Filled 2013-03-09 (×24): qty 1

## 2013-03-09 MED ORDER — PANTOPRAZOLE SODIUM 40 MG PO TBEC
40.0000 mg | DELAYED_RELEASE_TABLET | Freq: Every day | ORAL | Status: DC
  Administered 2013-03-10 – 2013-03-15 (×6): 40 mg via ORAL
  Filled 2013-03-09 (×7): qty 1

## 2013-03-09 MED ORDER — ACETAMINOPHEN 325 MG PO TABS
650.0000 mg | ORAL_TABLET | Freq: Four times a day (QID) | ORAL | Status: DC | PRN
  Administered 2013-03-10: 650 mg via ORAL
  Filled 2013-03-09: qty 2

## 2013-03-09 MED ORDER — ONDANSETRON HCL 4 MG PO TABS
4.0000 mg | ORAL_TABLET | ORAL | Status: DC | PRN
  Administered 2013-03-09: 4 mg via ORAL
  Filled 2013-03-09: qty 1

## 2013-03-09 MED ORDER — DOXEPIN HCL 50 MG PO CAPS
50.0000 mg | ORAL_CAPSULE | Freq: Every day | ORAL | Status: DC
  Administered 2013-03-10 – 2013-03-14 (×5): 50 mg via ORAL
  Filled 2013-03-09 (×7): qty 1

## 2013-03-09 MED ORDER — ONDANSETRON HCL 4 MG/2ML IJ SOLN: 4.0000 mg | Freq: Three times a day (TID) | INTRAMUSCULAR | Status: DC | PRN

## 2013-03-09 MED ORDER — ALBUTEROL SULFATE HFA 108 (90 BASE) MCG/ACT IN AERS: 2.0000 | INHALATION_SPRAY | RESPIRATORY_TRACT | Status: DC | PRN

## 2013-03-09 MED ORDER — SODIUM CHLORIDE 0.9 % IV SOLN: INTRAVENOUS | Status: DC

## 2013-03-09 MED ORDER — SIMVASTATIN 10 MG PO TABS
10.0000 mg | ORAL_TABLET | Freq: Every day | ORAL | Status: DC
  Administered 2013-03-10 – 2013-03-14 (×5): 10 mg via ORAL
  Filled 2013-03-09 (×6): qty 1

## 2013-03-09 MED ORDER — MOMETASONE FURO-FORMOTEROL FUM 100-5 MCG/ACT IN AERO
2.0000 | INHALATION_SPRAY | Freq: Two times a day (BID) | RESPIRATORY_TRACT | Status: DC
  Administered 2013-03-09 – 2013-03-15 (×12): 2 via RESPIRATORY_TRACT
  Filled 2013-03-09: qty 8.8

## 2013-03-09 MED ORDER — DARIFENACIN HYDROBROMIDE ER 7.5 MG PO TB24
7.5000 mg | ORAL_TABLET | Freq: Every day | ORAL | Status: DC
  Administered 2013-03-10 – 2013-03-15 (×6): 7.5 mg via ORAL
  Filled 2013-03-09 (×6): qty 1

## 2013-03-09 MED ORDER — OLANZAPINE 7.5 MG PO TABS
15.0000 mg | ORAL_TABLET | Freq: Every day | ORAL | Status: DC
  Administered 2013-03-10 – 2013-03-14 (×5): 15 mg via ORAL
  Filled 2013-03-09 (×6): qty 2

## 2013-03-09 MED ORDER — PROMETHAZINE HCL 25 MG/ML IJ SOLN
25.0000 mg | Freq: Once | INTRAMUSCULAR | Status: AC
  Administered 2013-03-09: 25 mg via INTRAVENOUS
  Filled 2013-03-09: qty 1

## 2013-03-09 MED ORDER — OLANZAPINE 7.5 MG PO TABS: 15.0000 mg | ORAL_TABLET | Freq: Every day | ORAL | Status: DC

## 2013-03-09 MED ORDER — SODIUM CHLORIDE 0.9 % IV SOLN
INTRAVENOUS | Status: DC
  Administered 2013-03-09 – 2013-03-11 (×4): via INTRAVENOUS

## 2013-03-09 MED ORDER — SODIUM CHLORIDE 0.9 % IV BOLUS (SEPSIS)
1000.0000 mL | Freq: Once | INTRAVENOUS | Status: AC
  Administered 2013-03-09: 1000 mL via INTRAVENOUS

## 2013-03-09 MED ORDER — GUAIFENESIN 100 MG/5ML PO SYRP: 200.0000 mg | ORAL_SOLUTION | ORAL | Status: DC | PRN

## 2013-03-09 MED ORDER — LORAZEPAM 2 MG/ML IJ SOLN
0.5000 mg | Freq: Once | INTRAMUSCULAR | Status: AC
  Administered 2013-03-09: 0.5 mg via INTRAVENOUS
  Filled 2013-03-09: qty 1

## 2013-03-09 MED ORDER — BENZTROPINE MESYLATE 0.5 MG PO TABS
0.5000 mg | ORAL_TABLET | Freq: Two times a day (BID) | ORAL | Status: DC
  Administered 2013-03-10 – 2013-03-15 (×11): 0.5 mg via ORAL
  Filled 2013-03-09 (×12): qty 1

## 2013-03-09 NOTE — H&P (Signed)
[facility] History and Physical  [name] [name] ZOX:[id number] DOB: 01-20-1971 DOA: 03/09/2013  Referring physician: ED physician PCP: Default, Provider, MD   Chief Complaint: nausea, vomiting, diarrhea  HPI:  Pt is 42 y.o. male resident of [facility] assisted [facility] who presented to [facility] ED with main concern of progressively worsening nausea and non bloody vomiting, associated with generalized abdominal discomfort, initially started one week prior to admission. Pt was recently seen in ED for same problem and was sent back with diagnosis of UTI and was given Cipro. Pt is rather poor historian and information he provides is inconsistent. He is not much verbal and is unwillingly proving details. Pt earlier reported symptoms mentioned above but has denied the symptoms on my interview. Patient's is nonambulatory at his baseline in requires a wheelchair.  In ED, pt with altered mental status, blood work with significant leukocytosis, [facility] asked to admit for further evaluation.   Assessment and Plan: Nausea, vomiting, diarrhea - unclear etiology at this time - pt was on Cipro recently, will check C. Diff by PCR - will provide antiemetics as needed - monitor electrolytes closely and provide IVF Leukocytosis - possibly viral in etiology, will check C. Diff by PCR as noted above as pt had used ABX recently - CBC in AM - will hold off on ABX for now until stool analysis id back  Code Status: Full Family Communication: Pt at bedside Disposition Plan: Admit to medical floor for further evaluation   Review of Systems:  Pt unable to provide due to acutely ill clinical status     Past Medical History  Diagnosis Date  . Chronic pain   . Anxiety   . Schizoaffective disorder   . Hearing loss   . Seizure   . Arthritis   . Asthma   . Foot drop   . Anemia   . Dehydration   . Hypotension   . GERD (gastroesophageal reflux disease)   . Hypothyroidism   . Constipation   . Polysubstance  abuse     No past surgical history on file.  Social History:  reports that he has never smoked. He does not have any smokeless tobacco history on file. He reports that he does not drink alcohol or use illicit drugs.  Allergies  Allergen Reactions  . Fexofenadine Hcl Other (See Comments)    unknown  . Percocet (Oxycodone-Acetaminophen) Other (See Comments)    unknown  . Thorazine (Chlorpromazine Hcl) Other (See Comments)    unknown    No known past medical family history   Prior to Admission medications   Medication Sig Start Date End Date Taking? Authorizing Provider  acetaminophen (TYLENOL) 325 MG tablet Take 650 mg by mouth every 6 (six) hours as needed. Pain   Yes Historical Provider, MD  albuterol (PROVENTIL HFA;VENTOLIN HFA) 108 (90 BASE) MCG/ACT inhaler Inhale 2 puffs into the lungs every 4 (four) hours as needed. For wheezing.   Yes Historical Provider, MD  ALPRAZolam Prudy Feeler) 0.5 MG tablet Take 0.5 mg by mouth daily at 12 noon.   Yes Historical Provider, MD  benztropine (COGENTIN) 0.5 MG tablet Take 0.5 mg by mouth 2 (two) times daily. Given 2 hours after thyroid medication   Yes Historical Provider, MD  ciprofloxacin (CIPRO) 500 MG tablet Take 1 tablet (500 mg total) by mouth 2 (two) times daily. One po bid x 7 days 03/01/13  Yes Rolan Bucco, MD  doxepin (SINEQUAN) 50 MG capsule Take 50 mg by mouth at bedtime.  Yes Historical Provider, MD  ferrous sulfate 325 (65 FE) MG tablet Take 325 mg by mouth daily with breakfast.   Yes Historical Provider, MD  FLUoxetine (PROZAC) 20 MG capsule Take 20 mg by mouth daily.   Yes Historical Provider, MD  Fluticasone-Salmeterol (ADVAIR) 100-50 MCG/DOSE AEPB Inhale 1 puff into the lungs 2 (two) times daily.   Yes Historical Provider, MD  guaifenesin (ROBITUSSIN) 100 MG/5ML syrup Take 200 mg by mouth every 4 (four) hours as needed for cough.   Yes Historical Provider, MD  levothyroxine (SYNTHROID, LEVOTHROID) 75 MCG tablet Take 75 mcg by mouth  daily.   Yes Historical Provider, MD  loratadine (CLARITIN) 10 MG tablet Take 10 mg by mouth daily.   Yes Historical Provider, MD  megestrol (MEGACE) 40 MG/ML suspension Take 400 mg by mouth 2 (two) times daily.    Yes Historical Provider, MD  mirtazapine (REMERON) 15 MG tablet Take 15 mg by mouth at bedtime.   Yes Historical Provider, MD  Nutritional Supplements (ENSURE PO) Take 1 Can by mouth 3 (three) times daily with meals.   Yes Historical Provider, MD  OLANZapine (ZYPREXA) 15 MG tablet Take 15 mg by mouth at bedtime.   Yes Historical Provider, MD  omeprazole (PRILOSEC) 40 MG capsule Take 40 mg by mouth daily.    Yes Historical Provider, MD  ondansetron (ZOFRAN) 4 MG tablet Take 4 mg by mouth every 8 (eight) hours as needed for nausea.   Yes Historical Provider, MD  phenytoin (DILANTIN) 100 MG ER capsule Take 300 mg by mouth at bedtime.   Yes Historical Provider, MD  polyethylene glycol powder (GLYCOLAX/MIRALAX) powder Take 17 g by mouth daily.   Yes Historical Provider, MD  simvastatin (ZOCOR) 10 MG tablet Take 10 mg by mouth at bedtime.   Yes Historical Provider, MD  solifenacin (VESICARE) 5 MG tablet Take 5 mg by mouth daily.    Yes Historical Provider, MD  sucralfate (CARAFATE) 1 G tablet Take 1 g by mouth 4 (four) times daily. 06/03/12 06/03/13 Yes Olivia Mackie, MD  traMADol (ULTRAM) 50 MG tablet Take 50 mg by mouth every 6 (six) hours as needed for pain (pain).   Yes Historical Provider, MD  zolpidem (AMBIEN) 5 MG tablet Take 5 mg by mouth at bedtime.   Yes Historical Provider, MD    Physical Exam: Filed Vitals:   03/09/13 1009 03/09/13 1049 03/09/13 1050  BP: 115/64 106/76   Pulse: 92 92   Temp: 97.9 F (36.6 C) 97.5 F (36.4 C)   TempSrc: Oral Oral   Resp: 16 16   SpO2: 92% 90% 95%    Physical Exam  Constitutional: Appears chronically ill, cachectic   HENT: Normocephalic. External right and left ear normal. Oropharynx is dry  Eyes: Conjunctivae and EOM are normal. PERRLA,  no scleral icterus.  Neck: Normal ROM. Neck supple. No JVD. No tracheal deviation. No thyromegaly.  CVS: RRR, S1/S2 +, no murmurs, no gallops, no carotid bruit.  Pulmonary: Effort and breath sounds normal, poor inspiratory effort, decreased breath sounds at bases Abdominal: Soft. BS +,  no distension, tenderness, rebound or guarding.  Musculoskeletal: Normal range of motion. No edema and no tenderness.  Lymphadenopathy: No lymphadenopathy noted, cervical, inguinal. Neuro: Alert. Overall non focal, but confused . Skin: Skin is warm and dry. No rash noted. Not diaphoretic. No erythema. No pallor.  Psychiatric:  flat affect   Labs on Admission:  Basic Metabolic Panel:  Recent Labs Lab 03/09/13 1200  NA 137  K 3.7  CL 101  CO2 25  GLUCOSE 77  BUN 6  CREATININE 0.92  CALCIUM 8.9   Liver Function Tests:  Recent Labs Lab 03/09/13 1200  AST 11  ALT 6  ALKPHOS 98  BILITOT 0.2*  PROT 7.5  ALBUMIN 3.2*    Recent Labs Lab 03/09/13 1200  LIPASE 20   CBC:  Recent Labs Lab 03/09/13 1200  WBC 20.4*  NEUTROABS 16.0*  HGB 13.3  HCT 38.6*  MCV 87.7  PLT 518*   Cardiac Enzymes:  Recent Labs Lab 03/09/13 1200  TROPONINI <0.30   CBG:  Recent Labs Lab 03/09/13 1043  GLUCAP 80    Radiological Exams on Admission: Dg Chest 2 View  03/09/2013   *RADIOLOGY REPORT*  Clinical Data: Chest pain  CHEST - 2 VIEW  Comparison: 03/01/2013  Findings: The heart and pulmonary vascularity are within normal limits.  Mild interstitial changes are again identified as well as some mild scarring.  No focal infiltrate or sizable effusion is seen.  The osseous structures are within normal limits.  IMPRESSION: Chronic changes without acute abnormality.  No change from prior exam.   Original Report Authenticated By: Alcide Clever, M.D.    EKG: Normal sinus rhythm, no ST/T wave changes  Debbora Presto, MD  [facility] Pager [phone]  If 7PM-7AM, please contact  night-coverage [email or web address] Password [facility] 03/09/2013, 3:27 PM

## 2013-03-09 NOTE — ED Notes (Signed)
Per EMS: Pt coming from [facility].  Pt c/o NVD, abd pain since last night.  Pt being treated for a respiratory infection.  Pt still smokes.

## 2013-03-09 NOTE — ED Provider Notes (Signed)
History     CSN: [id number]  Arrival date & time 03/09/13  9528   First MD Initiated Contact with Patient 03/09/13 1004      Chief Complaint  Patient presents with  . Nausea  . Emesis  . Diarrhea     HPI  [name] [name] is a 42 y.o. male resident of [facility] complaining of nausea vomiting and diarrhea for one week. History provided by patient, chart review and nurses at [facility] care assisted living. Patient was seen on the 19th and treated for a urinary tract infection with Cipro. Urine culture shows no growth. Note that triage note says that the nausea vomiting and diarrhea started last night however this is the patient tells me x7 days. Patient also endorses chest pain and he states his primary care doctor he has an infection he is being treated for. Level V caveat secondary to poor hearing and mental disorder. Patient is unable to address whether or not he has diarrhea, fever , et Karie Soda. Patient's is nonambulatory at his baseline in requires a wheelchair.  Past Medical History  Diagnosis Date  . Chronic pain   . Anxiety   . Schizoaffective disorder   . Hearing loss   . Seizure   . Arthritis   . Asthma   . Foot drop   . Anemia   . Dehydration   . Hypotension   . GERD (gastroesophageal reflux disease)   . Hypothyroidism   . Constipation   . Polysubstance abuse     No past surgical history on file.  No family history on file.  History  Substance Use Topics  . Smoking status: Never Smoker   . Smokeless tobacco: Not on file  . Alcohol Use: No      Review of Systems  Unable to perform ROS: Psychiatric disorder    Allergies  Fexofenadine hcl; Percocet; and Thorazine  Home Medications   Current Outpatient Rx  Name  Route  Sig  Dispense  Refill  . acetaminophen (TYLENOL) 325 MG tablet   Oral   Take 650 mg by mouth every 6 (six) hours as needed. Pain         . albuterol (PROVENTIL HFA;VENTOLIN HFA) 108 (90 BASE) MCG/ACT inhaler  Inhalation   Inhale 2 puffs into the lungs every 4 (four) hours as needed. For wheezing.         Marland Kitchen ALPRAZolam (XANAX) 0.5 MG tablet   Oral   Take 0.5 mg by mouth daily at 12 noon.         . benztropine (COGENTIN) 0.5 MG tablet   Oral   Take 0.5 mg by mouth 2 (two) times daily. Given 2 hours after thyroid medication         . ciprofloxacin (CIPRO) 500 MG tablet   Oral   Take 1 tablet (500 mg total) by mouth 2 (two) times daily. One po bid x 7 days   14 tablet   0   . doxepin (SINEQUAN) 50 MG capsule   Oral   Take 50 mg by mouth at bedtime.         . ferrous sulfate 325 (65 FE) MG tablet   Oral   Take 325 mg by mouth daily with breakfast.         . FLUoxetine (PROZAC) 20 MG capsule   Oral   Take 20 mg by mouth daily.         . Fluticasone-Salmeterol (ADVAIR) 100-50 MCG/DOSE AEPB   Inhalation  Inhale 1 puff into the lungs 2 (two) times daily.         Marland Kitchen guaifenesin (ROBITUSSIN) 100 MG/5ML syrup   Oral   Take 200 mg by mouth every 4 (four) hours as needed for cough.         . levothyroxine (SYNTHROID, LEVOTHROID) 75 MCG tablet   Oral   Take 75 mcg by mouth daily.         Marland Kitchen loratadine (CLARITIN) 10 MG tablet   Oral   Take 10 mg by mouth daily.         . megestrol (MEGACE) 40 MG/ML suspension   Oral   Take 400 mg by mouth 2 (two) times daily.          . mirtazapine (REMERON) 15 MG tablet   Oral   Take 15 mg by mouth at bedtime.         . Nutritional Supplements (ENSURE PO)   Oral   Take 1 Can by mouth 3 (three) times daily with meals.         Marland Kitchen OLANZapine (ZYPREXA) 15 MG tablet   Oral   Take 15 mg by mouth at bedtime.         Marland Kitchen omeprazole (PRILOSEC) 40 MG capsule   Oral   Take 40 mg by mouth daily.          . phenytoin (DILANTIN) 100 MG ER capsule   Oral   Take 300 mg by mouth at bedtime.         . polyethylene glycol powder (GLYCOLAX/MIRALAX) powder   Oral   Take 17 g by mouth daily.         . simvastatin (ZOCOR) 10  MG tablet   Oral   Take 10 mg by mouth at bedtime.         . solifenacin (VESICARE) 5 MG tablet   Oral   Take 5 mg by mouth daily.          . sucralfate (CARAFATE) 1 G tablet   Oral   Take 1 g by mouth 4 (four) times daily.         Marland Kitchen zolpidem (AMBIEN) 5 MG tablet   Oral   Take 5 mg by mouth at bedtime.           BP 106/76  Pulse 92  Temp(Src) 97.5 F (36.4 C) (Oral)  Resp 16  SpO2 95%  Physical Exam  Nursing note and vitals reviewed. Constitutional: He is oriented to person, place, and time. No distress.  HENT:  Head: Normocephalic.  Cachectic with temporal wasting.  Dry mucous membranes  Eyes: Conjunctivae and EOM are normal. Pupils are equal, round, and reactive to light.  Cardiovascular: Normal rate, regular rhythm and intact distal pulses.   Pulmonary/Chest: Effort normal. No stridor. No respiratory distress.  Abdominal: Soft. Bowel sounds are normal. He exhibits no distension and no mass. There is no tenderness. There is no rebound and no guarding.  Musculoskeletal: Normal range of motion.  Neurological: He is alert and oriented to person, place, and time.  Moves all extremities  Psychiatric: He has a normal mood and affect.    ED Course  Procedures (including critical care time)  Labs Reviewed  CBC WITH DIFFERENTIAL - Abnormal; Notable for the following:    WBC 20.4 (*)    HCT 38.6 (*)    Platelets 518 (*)    Neutrophils Relative % 78 (*)    Neutro Abs 16.0 (*)  Monocytes Absolute 1.6 (*)    All other components within normal limits  COMPREHENSIVE METABOLIC PANEL - Abnormal; Notable for the following:    Albumin 3.2 (*)    Total Bilirubin 0.2 (*)    All other components within normal limits  URINALYSIS, ROUTINE W REFLEX MICROSCOPIC - Abnormal; Notable for the following:    Hgb urine dipstick LARGE (*)    All other components within normal limits  URINE RAPID DRUG SCREEN (HOSP PERFORMED) - Abnormal; Notable for the following:     Benzodiazepines POSITIVE (*)    All other components within normal limits  URINE MICROSCOPIC-ADD ON - Abnormal; Notable for the following:    Bacteria, UA FEW (*)    All other components within normal limits  LIPASE, BLOOD  GLUCOSE, CAPILLARY  TROPONIN I   Dg Chest 2 View  03/09/2013   *RADIOLOGY REPORT*  Clinical Data: Chest pain  CHEST - 2 VIEW  Comparison: 03/01/2013  Findings: The heart and pulmonary vascularity are within normal limits.  Mild interstitial changes are again identified as well as some mild scarring.  No focal infiltrate or sizable effusion is seen.  The osseous structures are within normal limits.  IMPRESSION: Chronic changes without acute abnormality.  No change from prior exam.   Original Report Authenticated By: Alcide Clever, M.D.     Date: 03/09/2013  Rate: 93  Rhythm: normal sinus rhythm  QRS Axis: normal  Intervals: normal  ST/T Wave abnormalities: nonspecific ST/T changes  Conduction Disutrbances:none  Narrative Interpretation:   Old EKG Reviewed: unchanged    1. Dehydration   2. Nausea and vomiting   3. Leukocytosis       MDM   Filed Vitals:   03/09/13 1009 03/09/13 1049 03/09/13 1050  BP: 115/64 106/76   Pulse: 92 92   Temp: 97.9 F (36.6 C) 97.5 F (36.4 C)   TempSrc: Oral Oral   Resp: 16 16   SpO2: 92% 90% 95%     [name] [name] is a 42 y.o. male very poor historian with nausea, vomiting and questionable diarrhea and chest pain. Abdominal exam is nonsurgical, patient is afebrile. Blood work is unremarkable except for leukocytosis of 20.4 no bands there is neutrophilia. EKG is nonischemic, chest x-ray shows no acute abnormalities.  Troponin is negative  I have asked the nurse to obtain a urine sample. It is now 4 hours and I think a cath is in order if he cannot void. 2 in and out cath had an empty bladder and patient could not produce any urine.   Patient has failed a by mouth challenge with multiple trials of alternative nausea  medication. He will need to be admission admitted for dehydration and observation pending successful by mouth challenge.   Medications  sodium chloride 0.9 % bolus 1,000 mL (not administered)  0.9 %  sodium chloride infusion (not administered)  HYDROmorphone (DILAUDID) injection 1 mg (not administered)  ondansetron (ZOFRAN) injection 4 mg (not administered)  sodium chloride 0.9 % bolus 1,000 mL (1,000 mLs Intravenous New Bag/Given 03/09/13 1407)  ondansetron (ZOFRAN) injection 4 mg (4 mg Intravenous Given 03/09/13 1410)  LORazepam (ATIVAN) injection 0.5 mg (0.5 mg Intravenous Given 03/09/13 1407)  morphine 4 MG/ML injection 4 mg (4 mg Intravenous Given 03/09/13 1628)  promethazine (PHENERGAN) injection 25 mg (25 mg Intravenous Given 03/09/13 1628)          Joni Reining Malkia Nippert, PA-C 03/09/13 1635

## 2013-03-10 DIAGNOSIS — D72829 Elevated white blood cell count, unspecified: Secondary | ICD-10-CM | POA: Diagnosis present

## 2013-03-10 DIAGNOSIS — E039 Hypothyroidism, unspecified: Secondary | ICD-10-CM

## 2013-03-10 DIAGNOSIS — E876 Hypokalemia: Secondary | ICD-10-CM | POA: Diagnosis present

## 2013-03-10 DIAGNOSIS — R112 Nausea with vomiting, unspecified: Secondary | ICD-10-CM

## 2013-03-10 LAB — BASIC METABOLIC PANEL
CO2: 24 mEq/L (ref 19–32)
Calcium: 7.7 mg/dL — ABNORMAL LOW (ref 8.4–10.5)
Chloride: 106 mEq/L (ref 96–112)
Creatinine, Ser: 0.79 mg/dL (ref 0.50–1.35)
GFR calc Af Amer: >90 mL/min (ref >90)
Sodium: 138 mEq/L (ref 135–145)

## 2013-03-10 LAB — CBC
MCV: 88.7 fL (ref 78.0–100.0)
Platelets: 371 10*3/uL (ref 150–400)
RBC: 3.53 MIL/uL — ABNORMAL LOW (ref 4.22–5.81)
RDW: 13.8 % (ref 11.5–15.5)
WBC: 19.2 10*3/uL — ABNORMAL HIGH (ref 4.0–10.5)

## 2013-03-10 MED ORDER — BOOST / RESOURCE BREEZE PO LIQD
1.0000 | Freq: Three times a day (TID) | ORAL | Status: DC
  Administered 2013-03-10 – 2013-03-15 (×13): 1 via ORAL

## 2013-03-10 MED ORDER — POLYETHYLENE GLYCOL 3350 17 G PO PACK
17.0000 g | PACK | Freq: Every day | ORAL | Status: DC
  Administered 2013-03-10 – 2013-03-14 (×5): 17 g via ORAL
  Filled 2013-03-10 (×6): qty 1

## 2013-03-10 MED ORDER — ONDANSETRON HCL 4 MG/2ML IJ SOLN
4.0000 mg | Freq: Once | INTRAMUSCULAR | Status: AC
  Administered 2013-03-10: 4 mg via INTRAVENOUS
  Filled 2013-03-10: qty 2

## 2013-03-10 MED ORDER — ADULT MULTIVITAMIN W/MINERALS CH
1.0000 | ORAL_TABLET | Freq: Every day | ORAL | Status: DC
  Administered 2013-03-10 – 2013-03-15 (×6): 1 via ORAL
  Filled 2013-03-10 (×6): qty 1

## 2013-03-10 MED ORDER — MUPIROCIN 2 % EX OINT
1.0000 "application " | TOPICAL_OINTMENT | Freq: Two times a day (BID) | CUTANEOUS | Status: AC
  Administered 2013-03-10 – 2013-03-14 (×10): 1 via NASAL
  Filled 2013-03-10 (×2): qty 22

## 2013-03-10 MED ORDER — POTASSIUM CHLORIDE CRYS ER 20 MEQ PO TBCR
40.0000 meq | EXTENDED_RELEASE_TABLET | Freq: Once | ORAL | Status: AC
  Administered 2013-03-10: 40 meq via ORAL
  Filled 2013-03-10: qty 2

## 2013-03-10 MED ORDER — CHLORHEXIDINE GLUCONATE CLOTH 2 % EX PADS
6.0000 | MEDICATED_PAD | Freq: Every day | CUTANEOUS | Status: AC
  Administered 2013-03-10 – 2013-03-14 (×4): 6 via TOPICAL

## 2013-03-10 NOTE — ED Provider Notes (Signed)
Medical screening examination/treatment/procedure(s) were conducted as a shared visit with non-physician practitioner(s) and myself.  I personally evaluated the patient during the encounter Pt with prolonged nvd illness. abd soft nt. Labs. Ivf.   Suzi Roots, MD 03/10/13 [phone]

## 2013-03-10 NOTE — Care Management Note (Signed)
    Page 1 of 1   03/10/2013     12:35:38 PM   CARE MANAGEMENT NOTE 03/10/2013  Patient:  [name] [name],[name] [name]   Account Number:  [id number]  Date Initiated:  03/10/2013  Documentation initiated by:  Lorenda Ishihara  Subjective/Objective Assessment:   42 yo male admitted with nausea, vomiting, diarrhea. PTA lived at [facility]     Action/Plan:   Return to ALF when stable   Anticipated DC Date:  03/12/2013   Anticipated DC Plan:  ASSISTED LIVING / REST HOME  In-house referral  Clinical Social Worker      DC Planning Services  CM consult      Choice offered to / List presented to:             Status of service:  Completed, signed off Medicare Important Message given?   (If response is "NO", the following Medicare IM given date fields will be blank) Date Medicare IM given:   Date Additional Medicare IM given:    Discharge Disposition:  ASSISTED LIVING  Per UR Regulation:  Reviewed for med. necessity/level of care/duration of stay  If discussed at Long Length of Stay Meetings, dates discussed:    Comments:

## 2013-03-10 NOTE — Progress Notes (Signed)
[facility] PROGRESS NOTE  [name] [name] ZOX:[id number] DOB: 1971-04-09 DOA: 03/09/2013 PCP: Default, Provider, MD  Assessment/Plan: Nausea, vomiting, diarrhea  - unclear etiology at this time  - pt was on Cipro recently, will check C. Diff by PCR  - will provide antiemetics PRN - monitor electrolytes closely and provide IVF   Leukocytosis  - possibly viral in etiology, C. Diff by PCR pending as noted above as pt had used ABX recently  - CBC with differential in AM  - ESR and C-reactive protein - No antibiotics started initially.  Patient remains afebrile.  Hypokalemia - Will replace orally and reassess next am.  Hypothyroidism - continue synthroid   Code Status: full Family Communication: no family at bedside. Disposition Plan: Pending continued improvement in condition.   Consultants:  none  Procedures:  none  Antibiotics:  none   HPI/Subjective: No new complaints. No acute issues overnight.  Patient reports feeling improvement in condition  Objective: Filed Vitals:   03/09/13 2048 03/10/13 0621 03/10/13 0847 03/10/13 1340  BP:  102/69  103/59  Pulse:  89  106  Temp:  98.5 F (36.9 C)  98.2 F (36.8 C)  TempSrc:  Oral  Oral  Resp:  16  18  Height:      Weight:      SpO2: 100% 98% 94% 93%    Intake/Output Summary (Last 24 hours) at 03/10/13 1825 Last data filed at 03/10/13 1049  Gross per 24 hour  Intake    975 ml  Output    925 ml  Net     50 ml   Filed Weights   03/09/13 1720  Weight: 42 kg (92 lb 9.5 oz)    Exam:   General:  Pt in NAD, Alert and Awake  Cardiovascular: RRR, No MRG  Respiratory: CTA BL, no wheezes  Abdomen: soft, NT, ND  Musculoskeletal: no cyanosis or clubbing   Data Reviewed: Basic Metabolic Panel:  Recent Labs Lab 03/09/13 1200 03/10/13 0419  NA 137 138  K 3.7 3.1*  CL 101 106  CO2 25 24  GLUCOSE 77 74  BUN 6 5*  CREATININE 0.92 0.79  CALCIUM 8.9 7.7*   Liver Function Tests:  Recent  Labs Lab 03/09/13 1200  AST 11  ALT 6  ALKPHOS 98  BILITOT 0.2*  PROT 7.5  ALBUMIN 3.2*    Recent Labs Lab 03/09/13 1200  LIPASE 20   No results found for this basename: AMMONIA,  in the last 168 hours CBC:  Recent Labs Lab 03/09/13 1200 03/10/13 0419  WBC 20.4* 19.2*  NEUTROABS 16.0*  --   HGB 13.3 10.3*  HCT 38.6* 31.3*  MCV 87.7 88.7  PLT 518* 371   Cardiac Enzymes:  Recent Labs Lab 03/09/13 1200  TROPONINI <0.30   BNP (last 3 results) No results found for this basename: PROBNP,  in the last 8760 hours CBG:  Recent Labs Lab 03/09/13 1043  GLUCAP 80    Recent Results (from the past 240 hour(s))  URINE CULTURE     Status: None   Collection Time    03/01/13  5:57 PM      Result Value Range Status   Specimen Description URINE, CATHETERIZED   Final   Special Requests NONE   Final   Culture  Setup Time 03/02/2013 01:14   Final   Colony Count NO GROWTH   Final   Culture NO GROWTH   Final   Report Status 03/02/2013 FINAL   Final  MRSA PCR SCREENING     Status: Abnormal   Collection Time    03/09/13 11:55 PM      Result Value Range Status   MRSA by PCR POSITIVE (*) NEGATIVE Final   Comment: RESULT CALLED TO, READ BACK BY AND VERIFIED WITH:     Brantley Stage RN [id number] 03/10/13 A NAVARRO                The GeneXpert MRSA Assay (FDA     approved for NASAL specimens     only), is one component of a     comprehensive MRSA colonization     surveillance program. It is not     intended to diagnose MRSA     infection nor to guide or     monitor treatment for     MRSA infections.     Studies: Dg Chest 2 View  03/09/2013   *RADIOLOGY REPORT*  Clinical Data: Chest pain  CHEST - 2 VIEW  Comparison: 03/01/2013  Findings: The heart and pulmonary vascularity are within normal limits.  Mild interstitial changes are again identified as well as some mild scarring.  No focal infiltrate or sizable effusion is seen.  The osseous structures are within normal limits.   IMPRESSION: Chronic changes without acute abnormality.  No change from prior exam.   Original Report Authenticated By: Alcide Clever, M.D.    Scheduled Meds: . ALPRAZolam  0.5 mg Oral Q1200  . benztropine  0.5 mg Oral BID  . Chlorhexidine Gluconate Cloth  6 each Topical Q0600  . darifenacin  7.5 mg Oral Daily  . doxepin  50 mg Oral QHS  . enoxaparin (LOVENOX) injection  30 mg Subcutaneous Q24H  . feeding supplement  1 Container Oral TID BM  . ferrous sulfate  325 mg Oral Q breakfast  . FLUoxetine  20 mg Oral Daily  . levothyroxine  75 mcg Oral QAC breakfast  . loratadine  10 mg Oral Daily  . megestrol  400 mg Oral BID  . mometasone-formoterol  2 puff Inhalation BID  . multivitamin with minerals  1 tablet Oral Daily  . mupirocin ointment  1 application Nasal BID  . OLANZapine  15 mg Oral QHS  . pantoprazole  40 mg Oral Daily  . phenytoin  300 mg Oral QHS  . polyethylene glycol  17 g Oral Daily  . simvastatin  10 mg Oral QHS  . sodium chloride  3 mL Intravenous Q12H  . sucralfate  1 g Oral QID   Continuous Infusions: . sodium chloride 75 mL/hr at 03/10/13 0548    Active Problems:   * No active hospital problems. *    Time spent: > 35 minutes    [name]  [facility] Pager [phone]. If 7PM-7AM, please contact night-coverage at [email or web address], password [facility] 03/10/2013, 6:25 PM  LOS: 1 day

## 2013-03-10 NOTE — Evaluation (Signed)
Physical Therapy Evaluation Patient Details Name: [name] [name] MRN: [id number] DOB: May 31, 1971 Today's Date: 03/10/2013 Time: 4098-1191 PT Time Calculation (min): 16 min  PT Assessment / Plan / Recommendation Clinical Impression  Pt is a 42 year old male admitted for nausea, vomiting, diarrhea from ALF.  Pt very HOH and difficult to communicate with even with writing.  Pt reports he hasn't seen his family in 7 years, lives at [facility], transfers to w/c, feels unsteady with standing, and states using w/c since brain tumor (however no hx of brain tumor with chart review).  Pt likely close to baseline however will see in acute care to improve mobility and maintain strength to prepare pt for return to ALF.      PT Assessment  Patient needs continued PT services    Follow Up Recommendations  Supervision for mobility/OOB;No PT follow up    Does the patient have the potential to tolerate intense rehabilitation      Barriers to Discharge        Equipment Recommendations  None recommended by PT    Recommendations for Other Services     Frequency Min 3X/week    Precautions / Restrictions Precautions Precautions: Fall Precaution Comments: uses w/c, extremely HOH   Pertinent Vitals/Pain n/a      Mobility  Bed Mobility Bed Mobility: Supine to Sit Supine to Sit: 4: Min guard;HOB elevated Details for Bed Mobility Assistance: verbal cues for technique Transfers Transfers: Sit to Stand;Stand to Sit;Stand Pivot Transfers Sit to Stand: 4: Min assist;With upper extremity assist Stand to Sit: 4: Min assist;With upper extremity assist Stand Pivot Transfers: 4: Min assist;With armrests Details for Transfer Assistance: assist for weakness, stand pivot to recliner using armrests, unable to extend knees fully during transfer Ambulation/Gait Ambulation/Gait Assistance: Not tested (comment) (pt reports nonambulatory)    Exercises     PT Diagnosis: Generalized weakness  PT Problem List:  Decreased strength;Decreased activity tolerance;Decreased balance;Decreased mobility PT Treatment Interventions: DME instruction;Functional mobility training;Therapeutic activities;Therapeutic exercise;Wheelchair mobility training;Patient/family education;Neuromuscular re-education;Balance training   PT Goals Acute Rehab PT Goals PT Goal Formulation: With patient Time For Goal Achievement: 03/17/13 Potential to Achieve Goals: Good Pt will Transfer Bed to Chair/Chair to Bed: with modified independence PT Transfer Goal: Bed to Chair/Chair to Bed - Progress: Goal set today Pt will Perform Home Exercise Program: Independently PT Goal: Perform Home Exercise Program - Progress: Goal set today Pt will Propel Wheelchair: > 150 feet;with modified independence PT Goal: Propel Wheelchair - Progress: Goal set today  Visit Information  Last PT Received On: 03/10/13 Assistance Needed: +1    Subjective Data  Subjective: I haven't seen by family in 7 years. Patient Stated Goal: none stated   Prior Functioning  Home Living Lives With: Alone Type of Home: Assisted living Home Adaptive Equipment: Wheelchair - manual;Walker - rolling Prior Function Level of Independence: Independent with assistive device(s) Comments: pt states mobility with w/c  Communication Communication: HOH    Cognition  Cognition Arousal/Alertness: Awake/alert Behavior During Therapy: WFL for tasks assessed/performed Overall Cognitive Status: Within Functional Limits for tasks assessed    Extremity/Trunk Assessment Right Lower Extremity Assessment RLE ROM/Strength/Tone: Deficits RLE ROM/Strength/Tone Deficits: muscle atrophy present, unable to perform full knee extension actively however PROM WNL, pt reports R LE weaker than L since "brain tumor" (no hx of brain tumor in chart?), hx foot drop Left Lower Extremity Assessment LLE ROM/Strength/Tone: Deficits LLE ROM/Strength/Tone Deficits: muscle atrophy present, unable to  perform full knee extension actively however PROM WNL,  hx foot drop   Balance    End of Session PT - End of Session Activity Tolerance: Patient tolerated treatment well Patient left: in chair;with call bell/phone within reach;with chair alarm set Nurse Communication: Mobility status  GP     Lesley Galentine,KATHrine E 03/10/2013, 3:20 PM Zenovia Jarred, PT, DPT 03/10/2013 Pager: [phone]

## 2013-03-10 NOTE — Progress Notes (Signed)
INITIAL NUTRITION ASSESSMENT  DOCUMENTATION CODES Per approved criteria  -Underweight   INTERVENTION: Provide Resource Breeze TID Provide Mighty Shakes TID when diet is advanced Encourage PO intake Provide Multivitamin with minerals daily  NUTRITION DIAGNOSIS: Increased nutrient needs related to underweight as evidenced by BMI of 14.5.   Goal: Pt to meet >/= 90% of their estimated nutrition needs  Monitor:  PO intake/diet advancement Weight Labs  Reason for Assessment: Malnutrition Screening Tool, score of 3 and Low BMI  42 y.o. male  Admitting Dx: Nausea, vomiting  ASSESSMENT: 42 y.o. male resident of [facility] who presented to [facility] ED with main concern of progressively worsening nausea and non bloody vomiting, associated with generalized abdominal discomfort, initially started one week prior to admission.   Pt is hard of hearing and a poor historian. Pt reports that he was drinking 3 Mighty shakes with every meal PTA. Pt does not know how much he usual weighs and was unable to report usual PO intake PTA. Pt had not touched his lunch tray at time of visit and RN reports that pt did not eat breakfast either. Pt reports that his acid reflux is particularly bad today; pt usually takes Prilosec daily.  Height: Ht Readings from Last 1 Encounters:  03/09/13 5\' 7"  (1.702 m)    Weight: Wt Readings from Last 1 Encounters:  03/09/13 92 lb 9.5 oz (42 kg)    Ideal Body Weight: 148 lbs  % Ideal Body Weight: 62%  Wt Readings from Last 10 Encounters:  03/09/13 92 lb 9.5 oz (42 kg)    Usual Body Weight: unknown  % Usual Body Weight: NA  BMI:  Body mass index is 14.5 kg/(m^2).  Estimated Nutritional Needs: Kcal: 1470-1680 Protein: 63-76 grams Fluid: 1.8 L  Skin: WDL  Diet Order: Clear Liquid  EDUCATION NEEDS: -No education needs identified at this time   Intake/Output Summary (Last 24 hours) at 03/10/13 1356 Last data filed at 03/10/13 1049  Gross per 24 hour  Intake    975 ml  Output    925 ml  Net     50 ml    Last BM: 5/26  Labs:   Recent Labs Lab 03/09/13 1200 03/10/13 0419  NA 137 138  K 3.7 3.1*  CL 101 106  CO2 25 24  BUN 6 5*  CREATININE 0.92 0.79  CALCIUM 8.9 7.7*  GLUCOSE 77 74    CBG (last 3)   Recent Labs  03/09/13 1043  GLUCAP 80    Scheduled Meds: . ALPRAZolam  0.5 mg Oral Q1200  . benztropine  0.5 mg Oral BID  . Chlorhexidine Gluconate Cloth  6 each Topical Q0600  . darifenacin  7.5 mg Oral Daily  . doxepin  50 mg Oral QHS  . enoxaparin (LOVENOX) injection  30 mg Subcutaneous Q24H  . ferrous sulfate  325 mg Oral Q breakfast  . FLUoxetine  20 mg Oral Daily  . levothyroxine  75 mcg Oral QAC breakfast  . loratadine  10 mg Oral Daily  . megestrol  400 mg Oral BID  . mometasone-formoterol  2 puff Inhalation BID  . mupirocin ointment  1 application Nasal BID  . OLANZapine  15 mg Oral QHS  . pantoprazole  40 mg Oral Daily  . phenytoin  300 mg Oral QHS  . polyethylene glycol powder  17 g Oral Daily  . simvastatin  10 mg Oral QHS  . sodium chloride  3 mL Intravenous Q12H  . sucralfate  1 g Oral QID    Continuous Infusions: . sodium chloride 75 mL/hr at 03/10/13 0548    Past Medical History  Diagnosis Date  . Chronic pain   . Anxiety   . Schizoaffective disorder   . Hearing loss   . Seizure   . Arthritis   . Asthma   . Foot drop   . Anemia   . Dehydration   . Hypotension   . GERD (gastroesophageal reflux disease)   . Hypothyroidism   . Constipation   . Polysubstance abuse     History reviewed. No pertinent past surgical history.  [name] [name] RD, LDN Inpatient Clinical Dietitian Pager: [phone] After Hours Pager: [phone]

## 2013-03-11 LAB — BASIC METABOLIC PANEL
BUN: 3 mg/dL — ABNORMAL LOW (ref 6–23)
CO2: 21 mEq/L (ref 19–32)
Chloride: 105 mEq/L (ref 96–112)
Creatinine, Ser: 0.76 mg/dL (ref 0.50–1.35)
Glucose, Bld: 101 mg/dL — ABNORMAL HIGH (ref 70–99)
Potassium: 3.3 mEq/L — ABNORMAL LOW (ref 3.5–5.1)

## 2013-03-11 LAB — CBC WITH DIFFERENTIAL/PLATELET
Basophils Relative: 0 % (ref 0–1)
Eosinophils Absolute: 0.1 10*3/uL (ref 0.0–0.7)
Eosinophils Relative: 1 % (ref 0–5)
HCT: 29.2 % — ABNORMAL LOW (ref 39.0–52.0)
Hemoglobin: 9.8 g/dL — ABNORMAL LOW (ref 13.0–17.0)
Lymphs Abs: 1.7 10*3/uL (ref 0.7–4.0)
MCH: 29.2 pg (ref 26.0–34.0)
MCHC: 33.6 g/dL (ref 30.0–36.0)
MCV: 86.9 fL (ref 78.0–100.0)
Monocytes Absolute: 1.7 10*3/uL — ABNORMAL HIGH (ref 0.1–1.0)
Monocytes Relative: 8 % (ref 3–12)
Neutrophils Relative %: 83 % — ABNORMAL HIGH (ref 43–77)
RBC: 3.36 MIL/uL — ABNORMAL LOW (ref 4.22–5.81)

## 2013-03-11 LAB — URINE CULTURE: Colony Count: NO GROWTH

## 2013-03-11 MED ORDER — PIPERACILLIN-TAZOBACTAM 3.375 G IVPB
3.3750 g | Freq: Three times a day (TID) | INTRAVENOUS | Status: DC
  Administered 2013-03-11 – 2013-03-13 (×6): 3.375 g via INTRAVENOUS
  Filled 2013-03-11 (×7): qty 50

## 2013-03-11 NOTE — Progress Notes (Signed)
[facility] PROGRESS NOTE  [name] [name] ZOX:[id number] DOB: 07-27-1971 DOA: 03/09/2013 PCP: Default, Provider, MD  Assessment/Plan: Nausea, vomiting, diarrhea  - unclear etiology at this time  - pt was on Cipro recently, will check C. Diff by PCR  - will provide antiemetics PRN - monitor electrolytes closely and provide IVF - Place order for stool culture, giardia, ova and parasites.    Leukocytosis  - possibly viral in etiology, C. Diff by PCR pending as noted above as pt had remote history of ABX prior to admission. - CBC with differential has been reviewed and shows elevated neutrophil and absolute neutrophil count. - ESR and C-reactive protein reviewed and elevated. - Given results of ESR, C-reactive protein, elevated WBC will start patient on Zosyn  Hypokalemia - Has been replaced orally - recheck level next am.  Hypothyroidism - continue synthroid  Code Status: full Family Communication: no family at bedside. Disposition Plan: Pending continued improvement in condition.   Consultants:  none  Procedures:  none  Antibiotics:  Zosyn  HPI/Subjective: Patient requesting discharge. I have explained to patient that he will need further investigation prior to being discharged home.  Objective: Filed Vitals:   03/10/13 2008 03/10/13 2202 03/11/13 0622 03/11/13 1059  BP:  90/60 89/54   Pulse:  93 92   Temp:  98.3 F (36.8 C) 98.4 F (36.9 C)   TempSrc:  Oral Oral   Resp:  18 18   Height:      Weight:      SpO2: 93% 90% 90% 95%    Intake/Output Summary (Last 24 hours) at 03/11/13 1251 Last data filed at 03/11/13 1200  Gross per 24 hour  Intake   1475 ml  Output   1000 ml  Net    475 ml   Filed Weights   03/09/13 1720  Weight: 42 kg (92 lb 9.5 oz)    Exam:   General:  Pt in NAD, Alert and Awake  Cardiovascular: RRR, No MRG  Respiratory: CTA BL, no wheezes  Abdomen: soft, NT, ND  Musculoskeletal: no cyanosis or clubbing   Data  Reviewed: Basic Metabolic Panel:  Recent Labs Lab 03/09/13 1200 03/10/13 0419 03/11/13 0439  NA 137 138 135  K 3.7 3.1* 3.3*  CL 101 106 105  CO2 25 24 21   GLUCOSE 77 74 101*  BUN 6 5* 3*  CREATININE 0.92 0.79 0.76  CALCIUM 8.9 7.7* 7.6*   Liver Function Tests:  Recent Labs Lab 03/09/13 1200  AST 11  ALT 6  ALKPHOS 98  BILITOT 0.2*  PROT 7.5  ALBUMIN 3.2*    Recent Labs Lab 03/09/13 1200  LIPASE 20   No results found for this basename: AMMONIA,  in the last 168 hours CBC:  Recent Labs Lab 03/09/13 1200 03/10/13 0419 03/11/13 0520  WBC 20.4* 19.2* 20.1*  NEUTROABS 16.0*  --  16.7*  HGB 13.3 10.3* 9.8*  HCT 38.6* 31.3* 29.2*  MCV 87.7 88.7 86.9  PLT 518* 371 318   Cardiac Enzymes:  Recent Labs Lab 03/09/13 1200  TROPONINI <0.30   BNP (last 3 results) No results found for this basename: PROBNP,  in the last 8760 hours CBG:  Recent Labs Lab 03/09/13 1043  GLUCAP 80    Recent Results (from the past 240 hour(s))  URINE CULTURE     Status: None   Collection Time    03/01/13  5:57 PM      Result Value Range Status   Specimen Description URINE,  CATHETERIZED   Final   Special Requests NONE   Final   Culture  Setup Time 03/02/2013 01:14   Final   Colony Count NO GROWTH   Final   Culture NO GROWTH   Final   Report Status 03/02/2013 FINAL   Final  URINE CULTURE     Status: None   Collection Time    03/09/13 11:45 PM      Result Value Range Status   Specimen Description URINE, RANDOM   Final   Special Requests NONE   Final   Culture  Setup Time 03/10/2013 06:57   Final   Colony Count NO GROWTH   Final   Culture NO GROWTH   Final   Report Status 03/11/2013 FINAL   Final  MRSA PCR SCREENING     Status: Abnormal   Collection Time    03/09/13 11:55 PM      Result Value Range Status   MRSA by PCR POSITIVE (*) NEGATIVE Final   Comment: RESULT CALLED TO, READ BACK BY AND VERIFIED WITH:     Brantley Stage RN 0154 03/10/13 A NAVARRO                 The GeneXpert MRSA Assay (FDA     approved for NASAL specimens     only), is one component of a     comprehensive MRSA colonization     surveillance program. It is not     intended to diagnose MRSA     infection nor to guide or     monitor treatment for     MRSA infections.     Studies: No results found.  Scheduled Meds: . ALPRAZolam  0.5 mg Oral Q1200  . benztropine  0.5 mg Oral BID  . Chlorhexidine Gluconate Cloth  6 each Topical Q0600  . darifenacin  7.5 mg Oral Daily  . doxepin  50 mg Oral QHS  . enoxaparin (LOVENOX) injection  30 mg Subcutaneous Q24H  . feeding supplement  1 Container Oral TID BM  . ferrous sulfate  325 mg Oral Q breakfast  . FLUoxetine  20 mg Oral Daily  . levothyroxine  75 mcg Oral QAC breakfast  . loratadine  10 mg Oral Daily  . megestrol  400 mg Oral BID  . mometasone-formoterol  2 puff Inhalation BID  . multivitamin with minerals  1 tablet Oral Daily  . mupirocin ointment  1 application Nasal BID  . OLANZapine  15 mg Oral QHS  . pantoprazole  40 mg Oral Daily  . phenytoin  300 mg Oral QHS  . piperacillin-tazobactam (ZOSYN)  IV  3.375 g Intravenous Q8H  . polyethylene glycol  17 g Oral Daily  . simvastatin  10 mg Oral QHS  . sodium chloride  3 mL Intravenous Q12H  . sucralfate  1 g Oral QID   Continuous Infusions: . sodium chloride 75 mL/hr at 03/11/13 3086    Active Problems:   Dehydration   Hypothyroidism   Hypokalemia   Leukocytosis, unspecified    Time spent: > 35 minutes    [name]  [facility] Pager [phone]. If 7PM-7AM, please contact night-coverage at [email or web address], password [facility] 03/11/2013, 12:51 PM  LOS: 2 days

## 2013-03-11 NOTE — Progress Notes (Signed)
ANTIBIOTIC CONSULT NOTE - INITIAL  Pharmacy Consult for Levaquin Indication: Leukocytosis  Allergies  Allergen Reactions  . Fexofenadine Hcl Other (See Comments)    unknown  . Percocet (Oxycodone-Acetaminophen) Other (See Comments)    unknown  . Thorazine (Chlorpromazine Hcl) Other (See Comments)    unknown    Patient Measurements: Height: 5\' 7"  (170.2 cm) Weight: 92 lb 9.5 oz (42 kg) IBW/kg (Calculated) : 66.1  Vital Signs: Temp: 98.4 F (36.9 C) (05/29 0622) Temp src: Oral (05/29 0622) BP: 89/54 mmHg (05/29 0622) Pulse Rate: 92 (05/29 0622)  Labs:  Recent Labs  03/09/13 1200 03/10/13 0419 03/11/13 0439 03/11/13 0520  WBC 20.4* 19.2*  --  20.1*  HGB 13.3 10.3*  --  9.8*  PLT 518* 371  --  318  CREATININE 0.92 0.79 0.76  --    Estimated Creatinine Clearance: 71.5 ml/min (by C-G formula based on Cr of 0.76).    Microbiology: Recent Results (from the past 720 hour(s))  URINE CULTURE     Status: None   Collection Time    03/01/13  5:57 PM      Result Value Range Status   Specimen Description URINE, CATHETERIZED   Final   Special Requests NONE   Final   Culture  Setup Time 03/02/2013 01:14   Final   Colony Count NO GROWTH   Final   Culture NO GROWTH   Final   Report Status 03/02/2013 FINAL   Final  URINE CULTURE     Status: None   Collection Time    03/09/13 11:45 PM      Result Value Range Status   Specimen Description URINE, RANDOM   Final   Special Requests NONE   Final   Culture  Setup Time 03/10/2013 06:57   Final   Colony Count NO GROWTH   Final   Culture NO GROWTH   Final   Report Status 03/11/2013 FINAL   Final  MRSA PCR SCREENING     Status: Abnormal   Collection Time    03/09/13 11:55 PM      Result Value Range Status   MRSA by PCR POSITIVE (*) NEGATIVE Final   Comment: RESULT CALLED TO, READ BACK BY AND VERIFIED WITH:     Brantley Stage RN 0154 03/10/13 A NAVARRO                The GeneXpert MRSA Assay (FDA     approved for NASAL specimens     only), is one component of a     comprehensive MRSA colonization     surveillance program. It is not     intended to diagnose MRSA     infection nor to guide or     monitor treatment for     MRSA infections.    Medical History: Past Medical History  Diagnosis Date  . Chronic pain   . Anxiety   . Schizoaffective disorder   . Hearing loss   . Seizure   . Arthritis   . Asthma   . Foot drop   . Anemia   . Dehydration   . Hypotension   . GERD (gastroesophageal reflux disease)   . Hypothyroidism   . Constipation   . Polysubstance abuse     Medications:  Anti-infectives   None     Assessment: 42 yo M from [facility] admitted 03/09/13 with CC: N/V/D x1 week. Recently seen in ED on 5/19 and treated with Cipro 500mg  PO BID x7  days for UTI as outpatient. Urine culture obtained on 03/01/13 did not grow anything. Patient is afebrile but WBC remain elevated. 5/27 urine Cx also No Growth. 5/27 CXR shows no changes from 5/19 CXR. Note patient has a hx of seizures (avoiding carbapenems). Plan now is to start empiric Zosyn for leukocytosis with possible intraabdominal infection.  Plan:  1) Zosyn 3.375g IV q8h  Darrol Angel, PharmD Pager: [phone] 03/11/2013,8:00 AM

## 2013-03-11 NOTE — Progress Notes (Signed)
Clinical Social Work Department BRIEF PSYCHOSOCIAL ASSESSMENT 03/11/2013  Patient:  [name] [name],[name] [name]     Account Number:  [id number]     Admit date:  03/09/2013  Clinical Social Worker:  Orpah Greek  Date/Time:  03/11/2013 12:17 PM  Referred by:  Physician  Date Referred:  03/11/2013 Referred for  Other - See comment   Other Referral:   Admitted from: [facility]   Interview type:  Patient Other interview type:    PSYCHOSOCIAL DATA Living Status:  FACILITY Admitted from facility:  [facility] Level of care:  Assisted Living Primary support name:  [place] (spouse) ph#: [phone] Primary support relationship to patient:  SPOUSE Degree of support available:   good    CURRENT CONCERNS Current Concerns  Post-Acute Placement   Other Concerns:    SOCIAL WORK ASSESSMENT / PLAN CSW spoke with patient re: discharge planning - patient was admitted from [facility] and plans to return there at discharge tomorrow.   Assessment/plan status:  Information/Referral to [facility] Other assessment/ plan:   Information/referral to community resources:   CSW completed FL2 and faxed information to [facility] - confirmed with facility that they would be able to take him back when stable.    PATIENT'S/FAMILY'S RESPONSE TO PLAN OF CARE: Patient states that he and his wife live there and he has been pleased with the facility.       [name], LCSW [facility] Clinical Social Worker cell #: [phone]

## 2013-03-12 LAB — CBC
Hemoglobin: 10.1 g/dL — ABNORMAL LOW (ref 13.0–17.0)
MCH: 30.4 pg (ref 26.0–34.0)
MCHC: 34.6 g/dL (ref 30.0–36.0)
RDW: 13.7 % (ref 11.5–15.5)

## 2013-03-12 MED ORDER — SENNA 8.6 MG PO TABS
1.0000 | ORAL_TABLET | Freq: Once | ORAL | Status: AC
  Administered 2013-03-12: 8.6 mg via ORAL
  Filled 2013-03-12: qty 1

## 2013-03-12 NOTE — Progress Notes (Signed)
[facility] PROGRESS NOTE  [name] [name] ZOX:[id number] DOB: 1971/02/23 DOA: 03/09/2013 PCP: Default, Provider, MD  Assessment/Plan: Nausea, vomiting, diarrhea  - unclear etiology at this time, unsure if this is secondary to viral gastroenteritis since patient has been improved prior to receiving antibiotics.   - pt was on Cipro recently, will check C. Diff by PCR  - will provide antiemetics PRN - monitor electrolytes closely and provide IVF - Place order for stool culture, giardia, ova and parasites.  Stool has not yet been obtained for studies.    Leukocytosis  - possibly viral in etiology, C. Diff by PCR pending as noted above as pt had remote history of ABX prior to admission. - CBC with differential has been reviewed and shows elevated neutrophil and absolute neutrophil count. - ESR and C-reactive protein reviewed and elevated. - Given results of ESR, C-reactive protein, elevated WBC will patient started on Zosyn - Currently trending down  Hypokalemia - Has been replaced orally - recheck level next am.  Hypothyroidism - continue synthroid  Code Status: full Family Communication: no family at bedside. Disposition Plan: Pending continued improvement in condition.   Consultants:  none  Procedures:  none  Antibiotics:  Zosyn  HPI/Subjective: Patient requesting discharge. I have explained to patient that he will need further investigation prior to being discharged home. Currently he reports being able to eat better.  Objective: Filed Vitals:   03/11/13 1934 03/12/13 0631 03/12/13 1049 03/12/13 1428  BP:  90/55  92/57  Pulse:  95  95  Temp:  99.5 F (37.5 C)  97.4 F (36.3 C)  TempSrc:  Oral  Oral  Resp:  16  18  Height:      Weight:      SpO2: 98% 93% 98% 96%    Intake/Output Summary (Last 24 hours) at 03/12/13 1451 Last data filed at 03/12/13 1300  Gross per 24 hour  Intake   1695 ml  Output   1750 ml  Net    -55 ml   Filed Weights   03/09/13 1720  Weight: 42 kg (92 lb 9.5 oz)    Exam:   General:  Pt in NAD, Alert and Awake  Cardiovascular: RRR, No MRG  Respiratory: CTA BL, no wheezes  Abdomen: soft, NT, ND  Musculoskeletal: no cyanosis or clubbing   Data Reviewed: Basic Metabolic Panel:  Recent Labs Lab 03/09/13 1200 03/10/13 0419 03/11/13 0439  NA 137 138 135  K 3.7 3.1* 3.3*  CL 101 106 105  CO2 25 24 21   GLUCOSE 77 74 101*  BUN 6 5* 3*  CREATININE 0.92 0.79 0.76  CALCIUM 8.9 7.7* 7.6*   Liver Function Tests:  Recent Labs Lab 03/09/13 1200  AST 11  ALT 6  ALKPHOS 98  BILITOT 0.2*  PROT 7.5  ALBUMIN 3.2*    Recent Labs Lab 03/09/13 1200  LIPASE 20   No results found for this basename: AMMONIA,  in the last 168 hours CBC:  Recent Labs Lab 03/09/13 1200 03/10/13 0419 03/11/13 0520 03/12/13 1059  WBC 20.4* 19.2* 20.1* 18.3*  NEUTROABS 16.0*  --  16.7*  --   HGB 13.3 10.3* 9.8* 10.1*  HCT 38.6* 31.3* 29.2* 29.2*  MCV 87.7 88.7 86.9 88.0  PLT 518* 371 318 346   Cardiac Enzymes:  Recent Labs Lab 03/09/13 1200  TROPONINI <0.30   BNP (last 3 results) No results found for this basename: PROBNP,  in the last 8760 hours CBG:  Recent Labs Lab 03/09/13  1043  GLUCAP 80    Recent Results (from the past 240 hour(s))  URINE CULTURE     Status: None   Collection Time    03/09/13 11:45 PM      Result Value Range Status   Specimen Description URINE, RANDOM   Final   Special Requests NONE   Final   Culture  Setup Time 03/10/2013 06:57   Final   Colony Count NO GROWTH   Final   Culture NO GROWTH   Final   Report Status 03/11/2013 FINAL   Final  MRSA PCR SCREENING     Status: Abnormal   Collection Time    03/09/13 11:55 PM      Result Value Range Status   MRSA by PCR POSITIVE (*) NEGATIVE Final   Comment: RESULT CALLED TO, READ BACK BY AND VERIFIED WITH:     Brantley Stage RN 0154 03/10/13 A NAVARRO                The GeneXpert MRSA Assay (FDA     approved for NASAL  specimens     only), is one component of a     comprehensive MRSA colonization     surveillance program. It is not     intended to diagnose MRSA     infection nor to guide or     monitor treatment for     MRSA infections.     Studies: No results found.  Scheduled Meds: . ALPRAZolam  0.5 mg Oral Q1200  . benztropine  0.5 mg Oral BID  . Chlorhexidine Gluconate Cloth  6 each Topical Q0600  . darifenacin  7.5 mg Oral Daily  . doxepin  50 mg Oral QHS  . enoxaparin (LOVENOX) injection  30 mg Subcutaneous Q24H  . feeding supplement  1 Container Oral TID BM  . ferrous sulfate  325 mg Oral Q breakfast  . FLUoxetine  20 mg Oral Daily  . levothyroxine  75 mcg Oral QAC breakfast  . loratadine  10 mg Oral Daily  . megestrol  400 mg Oral BID  . mometasone-formoterol  2 puff Inhalation BID  . multivitamin with minerals  1 tablet Oral Daily  . mupirocin ointment  1 application Nasal BID  . OLANZapine  15 mg Oral QHS  . pantoprazole  40 mg Oral Daily  . phenytoin  300 mg Oral QHS  . piperacillin-tazobactam (ZOSYN)  IV  3.375 g Intravenous Q8H  . polyethylene glycol  17 g Oral Daily  . simvastatin  10 mg Oral QHS  . sodium chloride  3 mL Intravenous Q12H  . sucralfate  1 g Oral QID   Continuous Infusions: . sodium chloride 75 mL/hr at 03/11/13 4098    Active Problems:   Dehydration   Hypothyroidism   Hypokalemia   Leukocytosis, unspecified    Time spent: > 35 minutes    [name]  [facility] Pager [phone]. If 7PM-7AM, please contact night-coverage at [email or web address], password [facility] 03/12/2013, 2:51 PM  LOS: 3 days

## 2013-03-12 NOTE — Progress Notes (Signed)
Physical Therapy Treatment Patient Details Name: [name] [name] MRN: [id number] DOB: [birth year] Today's Date: 03/12/2013 Time: 4166-0630 PT Time Calculation (min): 24 min  PT Assessment / Plan / Recommendation Comments on Treatment Session  Pt reported feeling more weak today and required more assist with transfers.  Pt performed short distances of w/c propulsion in hallway and then assisted back to bed due to fatigue.  Pt hopes to d/c back to ALF today.    Follow Up Recommendations  Supervision for mobility/OOB;No PT follow up     Does the patient have the potential to tolerate intense rehabilitation     Barriers to Discharge        Equipment Recommendations  None recommended by PT    Recommendations for Other Services    Frequency     Plan Discharge plan remains appropriate;Frequency remains appropriate    Precautions / Restrictions Precautions Precautions: Fall Precaution Comments: uses w/c, extremely HOH   Pertinent Vitals/Pain n/a    Mobility  Bed Mobility Bed Mobility: Supine to Sit Supine to Sit: HOB elevated;5: Supervision;With rails Details for Bed Mobility Assistance: verbal cues for technique Transfers Transfers: Sit to Stand;Stand to Sit;Stand Pivot Transfers Sit to Stand: With upper extremity assist;3: Mod assist;From bed;From chair/3-in-1 Stand to Sit: With upper extremity assist;3: Mod assist;To bed;To chair/3-in-1 Stand Pivot Transfers: With armrests;3: Mod assist Details for Transfer Assistance: assist for weakness, stand pivot to recliner using armrests Ambulation/Gait Ambulation/Gait Assistance: Not tested (comment) (pt reports nonambulatory) Wheelchair Mobility Wheelchair Mobility: Yes Wheelchair Assistance: 4: Geographical information systems officer Details (indicate cue type and reason): assist for managing leg rests and brakes, also required set up for transfer and assist with wheeling closer to bed for w/c to bed transfer, pt took rest breaks between  distances of propulsion, short UE strokes observed and pt slow with propulsion Wheelchair Propulsion: Both upper extremities Wheelchair Parts Management: Needs assistance Distance: 30'x1, 70'x1, 60'x1    Exercises     PT Diagnosis:    PT Problem List:   PT Treatment Interventions:     PT Goals Acute Rehab PT Goals PT Transfer Goal: Bed to Chair/Chair to Bed - Progress: Progressing toward goal PT Goal: Propel Wheelchair - Progress: Progressing toward goal  Visit Information  Last PT Received On: 03/12/13 Assistance Needed: +1    Subjective Data  Subjective: Yeah I need to get out of this room for a bit. (w/c mobility, pt happy therapist brought w/c)   Cognition  Cognition Arousal/Alertness: Awake/alert Behavior During Therapy: WFL for tasks assessed/performed Overall Cognitive Status: Within Functional Limits for tasks assessed    Balance     End of Session PT - End of Session Activity Tolerance: Patient limited by fatigue Patient left: in bed;with call bell/phone within reach;with bed alarm set   GP     Burnham Trost,KATHrine E 03/12/2013, 12:46 PM Zenovia Jarred, PT, DPT 03/12/2013 Pager: [phone]

## 2013-03-13 LAB — CBC
MCV: 87.3 fL (ref 78.0–100.0)
Platelets: 388 10*3/uL (ref 150–400)
RDW: 13.9 % (ref 11.5–15.5)
WBC: 15.3 10*3/uL — ABNORMAL HIGH (ref 4.0–10.5)

## 2013-03-13 MED ORDER — ONDANSETRON 4 MG PO TBDP: 4.0000 mg | ORAL_TABLET | Freq: Three times a day (TID) | ORAL | Status: AC | PRN

## 2013-03-13 MED ORDER — ALPRAZOLAM 0.5 MG PO TABS: 0.5000 mg | ORAL_TABLET | Freq: Every day | ORAL | Status: DC

## 2013-03-13 MED ORDER — AMOXICILLIN-POT CLAVULANATE 875-125 MG PO TABS: 1.0000 | ORAL_TABLET | Freq: Two times a day (BID) | ORAL | Status: DC

## 2013-03-13 MED ORDER — AMOXICILLIN-POT CLAVULANATE 875-125 MG PO TABS
1.0000 | ORAL_TABLET | Freq: Two times a day (BID) | ORAL | Status: DC
  Administered 2013-03-13 – 2013-03-15 (×5): 1 via ORAL
  Filled 2013-03-13 (×6): qty 1

## 2013-03-13 NOTE — Progress Notes (Signed)
Per MD, Pt ready for d/c today.  Spoke with Lupita Leash, supervisor at [facility].    Per Lupita Leash, the facility cannot accept Pt's back over the weekend; Pt will have to return on Monday.  Notified MD and RN.  Providence Crosby, LCSWA Clinical Social Work [phone]

## 2013-03-13 NOTE — Discharge Summary (Addendum)
Physician Discharge Summary  Kemarion Abbey ZOX:[id number] DOB: November 21, 1970 DOA: 03/09/2013  PCP: Default, Provider, MD  Admit date: 03/09/2013 Discharge date: 03/13/2013  Time spent: > 35 minutes  Recommendations for Outpatient Follow-up:  1. Please be sure to follow up with pcp as needed within the next 1-2 weeks  Discharge Diagnoses:  Active Problems:   Dehydration   Hypothyroidism   Hypokalemia   Leukocytosis, unspecified   Discharge Condition: stable  Diet recommendation: regular diet  Filed Weights   03/09/13 1720  Weight: 42 kg (92 lb 9.5 oz)    History of present illness:  42 y/o resident at [facility] who presented with N,V,D to ed.   Hospital Course:  Nausea, vomiting, diarrhea  - unclear etiology at this time, unsure if this is secondary to viral gastroenteritis since patient has been improved prior to receiving antibiotics.  - pt was on Cipro recently, will check C. Diff by PCR (not likely c diff as patient has not had any diarrhea while in house) - will provide antiemetics PRN  - monitor electrolytes closely and provide IVF  - Place order for stool culture, giardia, ova and parasites. Patient had no BM while in house and as such we were not able to obtain stool studies for the things mentioned above.  Leukocytosis  - possibly viral in etiology, C. Diff by PCR pending as noted above as pt had remote history of ABX prior to admission.  - CBC with differential has been reviewed and shows elevated neutrophil and absolute neutrophil count.  - ESR and C-reactive protein reviewed and elevated.  - Given results of ESR, C-reactive protein, elevated WBC started patient on antibiotics but at this juncture most likely cause is viral .  Will go ahead and finish short course of antibiotics at this point. - Currently trending down   Hypokalemia  - Has been replaced orally  - Patient has had improved oral intake and as such suspect continued improvement.  Hypothyroidism   - continue synthroid  Disposition: to Facility once bed available. Likely Monday.  As such will reassess next am but discharge order will remain. - Of note patient had recorded low blood pressure yesterday evening but on repeat blood pressure was within normal limits without any intervention.  As such highly suspect a blood pressure recording error.   Procedures:  None  Consultations:  none  Discharge Exam: Filed Vitals:   03/12/13 2219 03/12/13 2310 03/13/13 0650 03/13/13 0830  BP: 81/49 96/66 97/64    Pulse: 89 93 91   Temp: 98 F (36.7 C)  98.6 F (37 C)   TempSrc: Oral  Oral   Resp: 18  18   Height:      Weight:      SpO2: 95%  91% 95%    General: Pt in NAD, Alert and Awake Cardiovascular: RRR, No MRG Respiratory: CTA BL, no wheezes  Discharge Instructions  Discharge Orders   Future Orders Complete By Expires     Call MD for:  persistant nausea and vomiting  As directed     Call MD for:  redness, tenderness, or signs of infection (pain, swelling, redness, odor or green/yellow discharge around incision site)  As directed     Call MD for:  severe uncontrolled pain  As directed     Call MD for:  temperature >100.4  As directed     Diet - low sodium heart healthy  As directed     Discharge instructions  As directed  Comments:      Please be sure to follow up with your primary care physician in 1-2 weeks or sooner    Increase activity slowly  As directed         Medication List    STOP taking these medications       ciprofloxacin 500 MG tablet  Commonly known as:  CIPRO      TAKE these medications       acetaminophen 325 MG tablet  Commonly known as:  TYLENOL  Take 650 mg by mouth every 6 (six) hours as needed. Pain     albuterol 108 (90 BASE) MCG/ACT inhaler  Commonly known as:  PROVENTIL HFA;VENTOLIN HFA  Inhale 2 puffs into the lungs every 4 (four) hours as needed. For wheezing.     ALPRAZolam 0.5 MG tablet  Commonly known as:  XANAX  Take 1  tablet (0.5 mg total) by mouth daily at 12 noon.     amoxicillin-clavulanate 875-125 MG per tablet  Commonly known as:  AUGMENTIN  Take 1 tablet by mouth every 12 (twelve) hours.     benztropine 0.5 MG tablet  Commonly known as:  COGENTIN  Take 0.5 mg by mouth 2 (two) times daily. Given 2 hours after thyroid medication     doxepin 50 MG capsule  Commonly known as:  SINEQUAN  Take 50 mg by mouth at bedtime.     ENSURE PO  Take 1 Can by mouth 3 (three) times daily with meals.     ferrous sulfate 325 (65 FE) MG tablet  Take 325 mg by mouth daily with breakfast.     FLUoxetine 20 MG capsule  Commonly known as:  PROZAC  Take 20 mg by mouth daily.     Fluticasone-Salmeterol 100-50 MCG/DOSE Aepb  Commonly known as:  ADVAIR  Inhale 1 puff into the lungs 2 (two) times daily.     guaifenesin 100 MG/5ML syrup  Commonly known as:  ROBITUSSIN  Take 200 mg by mouth every 4 (four) hours as needed for cough.     levothyroxine 75 MCG tablet  Commonly known as:  SYNTHROID, LEVOTHROID  Take 75 mcg by mouth daily.     loratadine 10 MG tablet  Commonly known as:  CLARITIN  Take 10 mg by mouth daily.     megestrol 40 MG/ML suspension  Commonly known as:  MEGACE  Take 400 mg by mouth 2 (two) times daily.     mirtazapine 15 MG tablet  Commonly known as:  REMERON  Take 15 mg by mouth at bedtime.     OLANZapine 15 MG tablet  Commonly known as:  ZYPREXA  Take 15 mg by mouth at bedtime.     omeprazole 40 MG capsule  Commonly known as:  PRILOSEC  Take 40 mg by mouth daily.     ondansetron 4 MG disintegrating tablet  Commonly known as:  ZOFRAN ODT  Take 1 tablet (4 mg total) by mouth every 8 (eight) hours as needed for nausea.     ondansetron 4 MG tablet  Commonly known as:  ZOFRAN  Take 4 mg by mouth every 8 (eight) hours as needed for nausea.     phenytoin 100 MG ER capsule  Commonly known as:  DILANTIN  Take 300 mg by mouth at bedtime.     polyethylene glycol powder powder   Commonly known as:  GLYCOLAX/MIRALAX  Take 17 g by mouth daily.     simvastatin 10 MG tablet  Commonly known  as:  ZOCOR  Take 10 mg by mouth at bedtime.     solifenacin 5 MG tablet  Commonly known as:  VESICARE  Take 5 mg by mouth daily.     sucralfate 1 G tablet  Commonly known as:  CARAFATE  Take 1 g by mouth 4 (four) times daily.     traMADol 50 MG tablet  Commonly known as:  ULTRAM  Take 50 mg by mouth every 6 (six) hours as needed for pain (pain).     zolpidem 5 MG tablet  Commonly known as:  AMBIEN  Take 5 mg by mouth at bedtime.       Allergies  Allergen Reactions  . Fexofenadine Hcl Other (See Comments)    unknown  . Percocet (Oxycodone-Acetaminophen) Other (See Comments)    unknown  . Thorazine (Chlorpromazine Hcl) Other (See Comments)    unknown      The results of significant diagnostics from this hospitalization (including imaging, microbiology, ancillary and laboratory) are listed below for reference.    Significant Diagnostic Studies: Dg Chest 2 View  03/09/2013   *RADIOLOGY REPORT*  Clinical Data: Chest pain  CHEST - 2 VIEW  Comparison: 03/01/2013  Findings: The heart and pulmonary vascularity are within normal limits.  Mild interstitial changes are again identified as well as some mild scarring.  No focal infiltrate or sizable effusion is seen.  The osseous structures are within normal limits.  IMPRESSION: Chronic changes without acute abnormality.  No change from prior exam.   Original Report Authenticated By: Alcide Clever, M.D.   Dg Chest 2 View  03/01/2013   *RADIOLOGY REPORT*  Clinical Data: Altered mental status  CHEST - 2 VIEW  Comparison: 07/23/2012  Findings: Cardiac shadow is stable.  Mild scarring is noted within the right lung.  No focal confluent infiltrate or sizable effusion is seen.  No bony abnormality is noted.  IMPRESSION: No acute abnormality seen.   Original Report Authenticated By: Alcide Clever, M.D.   Ct Head Wo Contrast  03/01/2013    *RADIOLOGY REPORT*  Clinical Data: Weakness, hypoglycemia, history of seizures, anemia, altered mental status  CT HEAD WITHOUT CONTRAST  Technique:  Contiguous axial images were obtained from the base of the skull through the vertex without contrast.  Comparison: None.  Findings: No skull fracture is noted.  The patient is status post lower posterior occipital craniotomy.  There is encephalomalacia in the right posterior inferior cerebellum.  Surgical clips are noted in this region.  No intracranial hemorrhage, mass effect or midline shift.  The gray and white matter differentiation is preserved.  No acute infarction.  No mass lesion is noted on this unenhanced scan.  IMPRESSION:  1.  The patient is status post occipital craniotomy. 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  No intracranial hemorrhage, mass effect or midline shift.  No acute cortical infarction.   Original Report Authenticated By: Natasha Mead, M.D.    Microbiology: Recent Results (from the past 240 hour(s))  URINE CULTURE     Status: None   Collection Time    03/09/13 11:45 PM      Result Value Range Status   Specimen Description URINE, RANDOM   Final   Special Requests NONE   Final   Culture  Setup Time 03/10/2013 06:57   Final   Colony Count NO GROWTH   Final   Culture NO GROWTH   Final   Report Status 03/11/2013 FINAL   Final  MRSA PCR SCREENING  Status: Abnormal   Collection Time    03/09/13 11:55 PM      Result Value Range Status   MRSA by PCR POSITIVE (*) NEGATIVE Final   Comment: RESULT CALLED TO, READ BACK BY AND VERIFIED WITH:     Brantley Stage RN [id number] 03/10/13 A NAVARRO                The GeneXpert MRSA Assay (FDA     approved for NASAL specimens     only), is one component of a     comprehensive MRSA colonization     surveillance program. It is not     intended to diagnose MRSA     infection nor to guide or     monitor treatment for     MRSA infections.     Labs: Basic Metabolic  Panel:  Recent Labs Lab 03/09/13 1200 03/10/13 0419 03/11/13 0439  NA 137 138 135  K 3.7 3.1* 3.3*  CL 101 106 105  CO2 25 24 21   GLUCOSE 77 74 101*  BUN 6 5* 3*  CREATININE 0.92 0.79 0.76  CALCIUM 8.9 7.7* 7.6*   Liver Function Tests:  Recent Labs Lab 03/09/13 1200  AST 11  ALT 6  ALKPHOS 98  BILITOT 0.2*  PROT 7.5  ALBUMIN 3.2*    Recent Labs Lab 03/09/13 1200  LIPASE 20   No results found for this basename: AMMONIA,  in the last 168 hours CBC:  Recent Labs Lab 03/09/13 1200 03/10/13 0419 03/11/13 0520 03/12/13 1059 03/13/13 0530  WBC 20.4* 19.2* 20.1* 18.3* 15.3*  NEUTROABS 16.0*  --  16.7*  --   --   HGB 13.3 10.3* 9.8* 10.1* 8.9*  HCT 38.6* 31.3* 29.2* 29.2* 26.9*  MCV 87.7 88.7 86.9 88.0 87.3  PLT 518* 371 318 346 388   Cardiac Enzymes:  Recent Labs Lab 03/09/13 1200  TROPONINI <0.30   BNP: BNP (last 3 results) No results found for this basename: PROBNP,  in the last 8760 hours CBG:  Recent Labs Lab 03/09/13 1043  GLUCAP 80       Signed:  [name]  [facility] 03/13/2013, 9:20 AM  Addendum: Patient seen and evaluated and ok for discharge today 03/15/13.  Patient throughout the course of his stay has had lower blood pressures around the time he sleeps. I suspect that this may be a chronic problem.  Clinically patient improving. No fevers, nausea, emesis, or abdominal pain.  Patient requesting discharge stating he feels better.  WBC has trended down and is near normal limits.  Ahaana Rochette, [employer]

## 2013-03-14 DIAGNOSIS — F1910 Other psychoactive substance abuse, uncomplicated: Secondary | ICD-10-CM

## 2013-03-14 NOTE — Progress Notes (Signed)
[facility] PROGRESS NOTE  [name] [name] ZOX:[id number] DOB: 04-10-1971 DOA: 03/09/2013 PCP: Default, Provider, MD  Assessment/Plan: Nausea, vomiting, diarrhea  - unclear etiology at this time, unsure if this is secondary to viral gastroenteritis since patient has been improved prior to receiving antibiotics.   - pt was on Cipro recently, will check C. Diff by PCR  - will provide antiemetics PRN - monitor electrolytes closely and provide IVF - Placed order for stool culture, giardia, ova and parasites.  Stool has not yet been obtained for studies.    Leukocytosis  - possibly viral in etiology, C. Diff by PCR pending as noted above as pt had remote history of ABX prior to admission. - CBC with differential has been reviewed and shows elevated neutrophil and absolute neutrophil count. - ESR and C-reactive protein reviewed and elevated. - Given results of ESR, C-reactive protein, elevated WBC will patient started on Zosyn - Currently trending down  Hypokalemia - Has been replaced orally - recheck level next am.  Hypothyroidism - continue synthroid  Code Status: full Family Communication: no family at bedside. Disposition Plan: Unable to discharge until Monday due to facility limitations.   Consultants:  none  Procedures:  none  Antibiotics:  Zosyn  HPI/Subjective: Patient requesting discharge. He states that he has been eating better.   Objective: Filed Vitals:   03/13/13 2001 03/13/13 2143 03/14/13 0518 03/14/13 0937  BP:  64/61 104/63   Pulse:  94 86   Temp:  98.2 F (36.8 C) 98 F (36.7 C)   TempSrc:  Oral Oral   Resp:  20 16   Height:      Weight:      SpO2: 96% 98% 97% 98%    Intake/Output Summary (Last 24 hours) at 03/14/13 1433 Last data filed at 03/14/13 1100  Gross per 24 hour  Intake    342 ml  Output   1550 ml  Net  -1208 ml   Filed Weights   03/09/13 1720  Weight: 42 kg (92 lb 9.5 oz)    Exam:   General:  Pt in NAD, Alert  and Awake  Cardiovascular: RRR, No MRG  Respiratory: CTA BL, no wheezes  Abdomen: soft, NT, ND  Musculoskeletal: no cyanosis or clubbing   Data Reviewed: Basic Metabolic Panel:  Recent Labs Lab 03/09/13 1200 03/10/13 0419 03/11/13 0439  NA 137 138 135  K 3.7 3.1* 3.3*  CL 101 106 105  CO2 25 24 21   GLUCOSE 77 74 101*  BUN 6 5* 3*  CREATININE 0.92 0.79 0.76  CALCIUM 8.9 7.7* 7.6*   Liver Function Tests:  Recent Labs Lab 03/09/13 1200  AST 11  ALT 6  ALKPHOS 98  BILITOT 0.2*  PROT 7.5  ALBUMIN 3.2*    Recent Labs Lab 03/09/13 1200  LIPASE 20   No results found for this basename: AMMONIA,  in the last 168 hours CBC:  Recent Labs Lab 03/09/13 1200 03/10/13 0419 03/11/13 0520 03/12/13 1059 03/13/13 0530  WBC 20.4* 19.2* 20.1* 18.3* 15.3*  NEUTROABS 16.0*  --  16.7*  --   --   HGB 13.3 10.3* 9.8* 10.1* 8.9*  HCT 38.6* 31.3* 29.2* 29.2* 26.9*  MCV 87.7 88.7 86.9 88.0 87.3  PLT 518* 371 318 346 388   Cardiac Enzymes:  Recent Labs Lab 03/09/13 1200  TROPONINI <0.30   BNP (last 3 results) No results found for this basename: PROBNP,  in the last 8760 hours CBG:  Recent Labs Lab 03/09/13 1043  GLUCAP 80    Recent Results (from the past 240 hour(s))  URINE CULTURE     Status: None   Collection Time    03/09/13 11:45 PM      Result Value Range Status   Specimen Description URINE, RANDOM   Final   Special Requests NONE   Final   Culture  Setup Time 03/10/2013 06:57   Final   Colony Count NO GROWTH   Final   Culture NO GROWTH   Final   Report Status 03/11/2013 FINAL   Final  MRSA PCR SCREENING     Status: Abnormal   Collection Time    03/09/13 11:55 PM      Result Value Range Status   MRSA by PCR POSITIVE (*) NEGATIVE Final   Comment: RESULT CALLED TO, READ BACK BY AND VERIFIED WITH:     Brantley Stage RN [id number] 03/10/13 A NAVARRO                The GeneXpert MRSA Assay (FDA     approved for NASAL specimens     only), is one component of a      comprehensive MRSA colonization     surveillance program. It is not     intended to diagnose MRSA     infection nor to guide or     monitor treatment for     MRSA infections.     Studies: No results found.  Scheduled Meds: . ALPRAZolam  0.5 mg Oral Q1200  . amoxicillin-clavulanate  1 tablet Oral Q12H  . benztropine  0.5 mg Oral BID  . Chlorhexidine Gluconate Cloth  6 each Topical Q0600  . darifenacin  7.5 mg Oral Daily  . doxepin  50 mg Oral QHS  . enoxaparin (LOVENOX) injection  30 mg Subcutaneous Q24H  . feeding supplement  1 Container Oral TID BM  . ferrous sulfate  325 mg Oral Q breakfast  . FLUoxetine  20 mg Oral Daily  . levothyroxine  75 mcg Oral QAC breakfast  . loratadine  10 mg Oral Daily  . megestrol  400 mg Oral BID  . mometasone-formoterol  2 puff Inhalation BID  . multivitamin with minerals  1 tablet Oral Daily  . OLANZapine  15 mg Oral QHS  . pantoprazole  40 mg Oral Daily  . phenytoin  300 mg Oral QHS  . polyethylene glycol  17 g Oral Daily  . simvastatin  10 mg Oral QHS  . sodium chloride  3 mL Intravenous Q12H  . sucralfate  1 g Oral QID   Continuous Infusions:    Active Problems:   Dehydration   Hypothyroidism   Hypokalemia   Leukocytosis, unspecified    Time spent: > 35 minutes    [name]  [facility] Pager [phone]. If 7PM-7AM, please contact night-coverage at [email or web address], password [facility] 03/14/2013, 2:33 PM  LOS: 5 days

## 2013-03-15 LAB — CBC
Hemoglobin: 9.4 g/dL — ABNORMAL LOW (ref 13.0–17.0)
MCHC: 32.4 g/dL (ref 30.0–36.0)
RDW: 14.1 % (ref 11.5–15.5)

## 2013-03-15 NOTE — Progress Notes (Addendum)
Pt D/C back to [facility], pt is alert and oriented, on room air, denies pain, vitals are within normal range for patient. D/C instructions given to Lurena Joiner ( receiving Nurse at the facility).

## 2013-03-15 NOTE — Progress Notes (Signed)
Pt D/C to SNF. Pt is alert and oriented on room air no new complains, vitals within reach for pt. RN will call in report to the facility.

## 2013-03-15 NOTE — Progress Notes (Signed)
Patient is set to discharge back to [facility] today. Patient & wife, Darreld Mclean (cell#: [phone]) made aware. Discharge packet in [place]. [facility] called for transport.   [name], LCSW [facility] Clinical Social Worker cell #: [phone]

## 2013-07-17 ENCOUNTER — Inpatient Hospital Stay (HOSPITAL_COMMUNITY)
Admission: EM | Admit: 2013-07-17 | Discharge: 2013-07-21 | DRG: 871 | Disposition: A | Discharge status: Home Health | Payer: Medicare Other | Attending: Internal Medicine | Admitting: Internal Medicine

## 2013-07-17 ENCOUNTER — Emergency Department (HOSPITAL_COMMUNITY): Payer: Medicare Other

## 2013-07-17 ENCOUNTER — Encounter (HOSPITAL_COMMUNITY): Payer: Self-pay | Admitting: Emergency Medicine

## 2013-07-17 DIAGNOSIS — R64 Cachexia: Secondary | ICD-10-CM | POA: Diagnosis present

## 2013-07-17 DIAGNOSIS — R531 Weakness: Secondary | ICD-10-CM

## 2013-07-17 DIAGNOSIS — R5381 Other malaise: Secondary | ICD-10-CM | POA: Diagnosis present

## 2013-07-17 DIAGNOSIS — J9600 Acute respiratory failure, unspecified whether with hypoxia or hypercapnia: Secondary | ICD-10-CM | POA: Diagnosis present

## 2013-07-17 DIAGNOSIS — Z681 Body mass index (BMI) 19 or less, adult: Secondary | ICD-10-CM

## 2013-07-17 DIAGNOSIS — E860 Dehydration: Secondary | ICD-10-CM

## 2013-07-17 DIAGNOSIS — H9190 Unspecified hearing loss, unspecified ear: Secondary | ICD-10-CM | POA: Diagnosis present

## 2013-07-17 DIAGNOSIS — E876 Hypokalemia: Secondary | ICD-10-CM

## 2013-07-17 DIAGNOSIS — J440 Chronic obstructive pulmonary disease with acute lower respiratory infection: Secondary | ICD-10-CM | POA: Diagnosis present

## 2013-07-17 DIAGNOSIS — D509 Iron deficiency anemia, unspecified: Secondary | ICD-10-CM | POA: Diagnosis present

## 2013-07-17 DIAGNOSIS — R569 Unspecified convulsions: Secondary | ICD-10-CM | POA: Diagnosis present

## 2013-07-17 DIAGNOSIS — K219 Gastro-esophageal reflux disease without esophagitis: Secondary | ICD-10-CM

## 2013-07-17 DIAGNOSIS — D638 Anemia in other chronic diseases classified elsewhere: Secondary | ICD-10-CM | POA: Diagnosis present

## 2013-07-17 DIAGNOSIS — I959 Hypotension, unspecified: Secondary | ICD-10-CM | POA: Diagnosis present

## 2013-07-17 DIAGNOSIS — J40 Bronchitis, not specified as acute or chronic: Secondary | ICD-10-CM

## 2013-07-17 DIAGNOSIS — A419 Sepsis, unspecified organism: Principal | ICD-10-CM | POA: Diagnosis present

## 2013-07-17 DIAGNOSIS — R0602 Shortness of breath: Secondary | ICD-10-CM

## 2013-07-17 DIAGNOSIS — J189 Pneumonia, unspecified organism: Secondary | ICD-10-CM

## 2013-07-17 DIAGNOSIS — R000 Tachycardia, unspecified: Secondary | ICD-10-CM | POA: Diagnosis present

## 2013-07-17 DIAGNOSIS — I509 Heart failure, unspecified: Secondary | ICD-10-CM

## 2013-07-17 DIAGNOSIS — F17213 Nicotine dependence, cigarettes, with withdrawal: Secondary | ICD-10-CM

## 2013-07-17 DIAGNOSIS — D649 Anemia, unspecified: Secondary | ICD-10-CM

## 2013-07-17 DIAGNOSIS — J9601 Acute respiratory failure with hypoxia: Secondary | ICD-10-CM

## 2013-07-17 DIAGNOSIS — F259 Schizoaffective disorder, unspecified: Secondary | ICD-10-CM | POA: Diagnosis present

## 2013-07-17 DIAGNOSIS — R059 Cough, unspecified: Secondary | ICD-10-CM

## 2013-07-17 DIAGNOSIS — R0600 Dyspnea, unspecified: Secondary | ICD-10-CM

## 2013-07-17 DIAGNOSIS — J441 Chronic obstructive pulmonary disease with (acute) exacerbation: Secondary | ICD-10-CM

## 2013-07-17 DIAGNOSIS — J209 Acute bronchitis, unspecified: Secondary | ICD-10-CM | POA: Diagnosis present

## 2013-07-17 DIAGNOSIS — R0902 Hypoxemia: Secondary | ICD-10-CM | POA: Diagnosis present

## 2013-07-17 DIAGNOSIS — E039 Hypothyroidism, unspecified: Secondary | ICD-10-CM | POA: Diagnosis present

## 2013-07-17 DIAGNOSIS — T380X5A Adverse effect of glucocorticoids and synthetic analogues, initial encounter: Secondary | ICD-10-CM | POA: Diagnosis present

## 2013-07-17 DIAGNOSIS — F17200 Nicotine dependence, unspecified, uncomplicated: Secondary | ICD-10-CM | POA: Diagnosis present

## 2013-07-17 DIAGNOSIS — R05 Cough: Secondary | ICD-10-CM

## 2013-07-17 DIAGNOSIS — G894 Chronic pain syndrome: Secondary | ICD-10-CM | POA: Diagnosis present

## 2013-07-17 DIAGNOSIS — D72829 Elevated white blood cell count, unspecified: Secondary | ICD-10-CM

## 2013-07-17 DIAGNOSIS — R079 Chest pain, unspecified: Secondary | ICD-10-CM | POA: Diagnosis present

## 2013-07-17 DIAGNOSIS — F1910 Other psychoactive substance abuse, uncomplicated: Secondary | ICD-10-CM

## 2013-07-17 HISTORY — DX: Malignant (primary) neoplasm, unspecified: C80.1

## 2013-07-17 LAB — CBC
HCT: 31.6 % — ABNORMAL LOW (ref 39.0–52.0)
Hemoglobin: 11 g/dL — ABNORMAL LOW (ref 13.0–17.0)
MCH: 30.9 pg (ref 26.0–34.0)
MCHC: 34.8 g/dL (ref 30.0–36.0)
MCV: 88.8 fL (ref 78.0–100.0)
Platelets: 383 10*3/uL (ref 150–400)
RBC: 3.56 MIL/uL — ABNORMAL LOW (ref 4.22–5.81)
RDW: 13 % (ref 11.5–15.5)
WBC: 17.8 10*3/uL — ABNORMAL HIGH (ref 4.0–10.5)

## 2013-07-17 LAB — BASIC METABOLIC PANEL
BUN: 12 mg/dL (ref 6–23)
CO2: 25 mEq/L (ref 19–32)
Calcium: 8 mg/dL — ABNORMAL LOW (ref 8.4–10.5)
Chloride: 100 mEq/L (ref 96–112)
Creatinine, Ser: 1.04 mg/dL (ref 0.50–1.35)
GFR calc Af Amer: >90 mL/min (ref >90)
GFR calc non Af Amer: 86 mL/min — ABNORMAL LOW (ref >90)
Glucose, Bld: 76 mg/dL (ref 70–99)
Potassium: 2.9 mEq/L — ABNORMAL LOW (ref 3.5–5.1)
Sodium: 138 mEq/L (ref 135–145)

## 2013-07-17 LAB — URINALYSIS, ROUTINE W REFLEX MICROSCOPIC
Bilirubin Urine: NEGATIVE
Glucose, UA: NEGATIVE mg/dL
Hgb urine dipstick: NEGATIVE
Ketones, ur: NEGATIVE mg/dL
Leukocytes, UA: NEGATIVE
Nitrite: NEGATIVE
Protein, ur: 30 mg/dL — AB
Specific Gravity, Urine: 1.014 (ref 1.005–1.030)
Urobilinogen, UA: 1 mg/dL (ref 0.0–1.0)
pH: 6.5 (ref 5.0–8.0)

## 2013-07-17 LAB — TROPONIN I: Troponin I: <0.3 ng/mL (ref <0.30)

## 2013-07-17 LAB — PRO B NATRIURETIC PEPTIDE: Pro B Natriuretic peptide (BNP): 730.8 pg/mL — ABNORMAL HIGH (ref 0–125)

## 2013-07-17 LAB — URINE MICROSCOPIC-ADD ON: Urine-Other: NONE SEEN

## 2013-07-17 MED ORDER — ALBUTEROL SULFATE (5 MG/ML) 0.5% IN NEBU
2.5000 mg | INHALATION_SOLUTION | Freq: Once | RESPIRATORY_TRACT | Status: AC
  Administered 2013-07-17: 2.5 mg via RESPIRATORY_TRACT
  Filled 2013-07-17: qty 0.5

## 2013-07-17 MED ORDER — POTASSIUM CHLORIDE CRYS ER 20 MEQ PO TBCR
40.0000 meq | EXTENDED_RELEASE_TABLET | Freq: Once | ORAL | Status: AC
  Administered 2013-07-17: 40 meq via ORAL
  Filled 2013-07-17: qty 2

## 2013-07-17 MED ORDER — IOHEXOL 350 MG/ML SOLN
100.0000 mL | Freq: Once | INTRAVENOUS | Status: AC | PRN
  Administered 2013-07-17: 100 mL via INTRAVENOUS

## 2013-07-17 NOTE — ED Notes (Signed)
Please has returned from xray

## 2013-07-17 NOTE — ED Notes (Signed)
Per EMS: Pt is from [facility] and reports generalized weakness, fever, productive cough, and right sided flank pain. Pt denies N/V/D. Pt is very hard of hearing. Pt received 800 ml of NS in route by EMS. Pt is Alert, follows commands, and has history of schizophrenia.

## 2013-07-17 NOTE — ED Provider Notes (Signed)
CSN: [id number]     Arrival date & time 07/17/13  1952 History   First MD Initiated Contact with Patient 07/17/13 1958     Chief Complaint  Patient presents with  . Weakness  . Cough   (Consider location/radiation/quality/duration/timing/severity/associated sxs/prior Treatment) HPI Pt is a 42yo male BIB EMS from [facility] with report of generalzied weakness, fever, productive cough and right sided flank pain.  Hx is difficult to obtain as pt is very hard of hearing.  Pt reports having cough for the last 2 months.  States it will not go away and causes his right side to hurt.  Right flank pain is sharp, constant, worse with cough, 10/10 pain.  Pt has hx of asthma.  Reports nausea and vomiting when he coughs.  Denies diarrhea.  Past Medical History  Diagnosis Date  . Chronic pain   . Anxiety   . Schizoaffective disorder   . Hearing loss   . Seizure   . Arthritis   . Asthma   . Foot drop   . Anemia   . Dehydration   . Hypotension   . GERD (gastroesophageal reflux disease)   . Hypothyroidism   . Constipation   . Polysubstance abuse    History reviewed. No pertinent past surgical history. No family history on file. History  Substance Use Topics  . Smoking status: Never Smoker   . Smokeless tobacco: Never Used  . Alcohol Use: No    Review of Systems  Constitutional: Positive for chills.  Respiratory: Positive for cough and shortness of breath.   Genitourinary: Positive for flank pain ( right).  Neurological: Positive for weakness.  All other systems reviewed and are negative.    Allergies  Fexofenadine hcl; Percocet; and Thorazine  Home Medications   Current Outpatient Rx  Name  Route  Sig  Dispense  Refill  . ALPRAZolam (XANAX) 0.5 MG tablet   Oral   Take 1 tablet (0.5 mg total) by mouth daily at 12 noon.   20 tablet   0   . benztropine (COGENTIN) 0.5 MG tablet   Oral   Take 0.5 mg by mouth 2 (two) times daily. Given 2 hours after thyroid medication          . doxepin (SINEQUAN) 50 MG capsule   Oral   Take 50 mg by mouth at bedtime.         . ferrous sulfate 325 (65 FE) MG tablet   Oral   Take 325 mg by mouth daily with breakfast.         . FLUoxetine (PROZAC) 10 MG capsule   Oral   Take 10 mg by mouth daily.         Marland Kitchen FLUoxetine (PROZAC) 20 MG capsule   Oral   Take 20 mg by mouth daily. With 10 mg to = 30 mg         . Fluticasone-Salmeterol (ADVAIR) 100-50 MCG/DOSE AEPB   Inhalation   Inhale 1 puff into the lungs 2 (two) times daily.         Marland Kitchen levothyroxine (SYNTHROID, LEVOTHROID) 75 MCG tablet   Oral   Take 75 mcg by mouth daily.         Marland Kitchen loratadine (CLARITIN) 10 MG tablet   Oral   Take 10 mg by mouth daily.         . mirtazapine (REMERON) 45 MG tablet   Oral   Take 22.5 mg by mouth at bedtime.         Marland Kitchen  Nutritional Supplements (ENSURE PO)   Oral   Take 1 Can by mouth 3 (three) times daily with meals.         Marland Kitchen OLANZapine (ZYPREXA) 15 MG tablet   Oral   Take 15 mg by mouth at bedtime.         Marland Kitchen omeprazole (PRILOSEC) 40 MG capsule   Oral   Take 40 mg by mouth daily.          . phenytoin (DILANTIN) 100 MG ER capsule   Oral   Take 300 mg by mouth at bedtime.         . polyethylene glycol powder (GLYCOLAX/MIRALAX) powder   Oral   Take 17 g by mouth daily.         . simvastatin (ZOCOR) 10 MG tablet   Oral   Take 10 mg by mouth at bedtime.         . solifenacin (VESICARE) 5 MG tablet   Oral   Take 5 mg by mouth daily.          Marland Kitchen EXPIRED: sucralfate (CARAFATE) 1 G tablet   Oral   Take 1 g by mouth 4 (four) times daily.         . traMADol (ULTRAM) 50 MG tablet   Oral   Take 50 mg by mouth every 6 (six) hours as needed for pain (pain).         Marland Kitchen zolpidem (AMBIEN) 5 MG tablet   Oral   Take 5 mg by mouth at bedtime.         Marland Kitchen acetaminophen (TYLENOL) 325 MG tablet   Oral   Take 650 mg by mouth every 6 (six) hours as needed. Pain         . albuterol (PROVENTIL  HFA;VENTOLIN HFA) 108 (90 BASE) MCG/ACT inhaler   Inhalation   Inhale 2 puffs into the lungs every 4 (four) hours as needed. For wheezing.         Marland Kitchen guaifenesin (ROBITUSSIN) 100 MG/5ML syrup   Oral   Take 200 mg by mouth every 4 (four) hours as needed for cough.         . megestrol (MEGACE) 40 MG/ML suspension   Oral   Take 400 mg by mouth 2 (two) times daily.          . ondansetron (ZOFRAN ODT) 4 MG disintegrating tablet   Oral   Take 1 tablet (4 mg total) by mouth every 8 (eight) hours as needed for nausea.   20 tablet   0   . ondansetron (ZOFRAN) 4 MG tablet   Oral   Take 4 mg by mouth every 8 (eight) hours as needed for nausea.          BP 100/68  Pulse 93  Temp(Src) 98.6 F (37 C) (Rectal)  Resp 15  SpO2 100% Physical Exam  Nursing note and vitals reviewed. Constitutional:  Cachectic appearing male lying in exam bed, nasal canula in place. Shivering, multiple blankets over patient.  HENT:  Head: Normocephalic and atraumatic.  Nose: Nose normal.  Mouth/Throat: Uvula is midline, oropharynx is clear and moist and mucous membranes are normal. No oropharyngeal exudate, posterior oropharyngeal edema, posterior oropharyngeal erythema or tonsillar abscesses.  Bilateral cerumen impaction  Eyes: Conjunctivae are normal. No scleral icterus.  Neck: Normal range of motion.  Cardiovascular: Normal rate, regular rhythm and normal heart sounds.   Pulmonary/Chest: Effort normal. No respiratory distress. He has wheezes. He has rales. He exhibits no  tenderness.  Coarse breath sounds left lower lung field. No respiratory distress, no accessory muscle use.  Abdominal: Soft. Bowel sounds are normal. He exhibits no distension and no mass. There is no tenderness. There is no rebound and no guarding.  Musculoskeletal: Normal range of motion.  Neurological: He is alert.  Skin: Skin is warm and dry.    ED Course  Procedures (including critical care time) Labs Review Labs  Reviewed  CBC - Abnormal; Notable for the following:    WBC 17.8 (*)    RBC 3.56 (*)    Hemoglobin 11.0 (*)    HCT 31.6 (*)    All other components within normal limits  BASIC METABOLIC PANEL - Abnormal; Notable for the following:    Potassium 2.9 (*)    Calcium 8.0 (*)    GFR calc non Af Amer 86 (*)    All other components within normal limits  PRO B NATRIURETIC PEPTIDE - Abnormal; Notable for the following:    Pro B Natriuretic peptide (BNP) 730.8 (*)    All other components within normal limits  URINALYSIS, ROUTINE W REFLEX MICROSCOPIC - Abnormal; Notable for the following:    APPearance CLOUDY (*)    Protein, ur 30 (*)    All other components within normal limits  TROPONIN I  URINE MICROSCOPIC-ADD ON  CG4 I-STAT (LACTIC ACID)   Imaging Review Dg Chest 2 View  07/17/2013   CLINICAL DATA:  Shortness of breath  EXAM: CHEST  2 VIEW  COMPARISON:  03/09/2013  FINDINGS: The cardiac shadow is stable. Some diffuse chronic interstitial changes are again noted. Increased vascular congestion with interstitial edema is noted. No focal confluent infiltrate is seen.  IMPRESSION: Changes consistent with congestive failure.   Electronically Signed   By: Alcide Clever M.D.   On: 07/17/2013 20:39   Ct Angio Chest Pe W/cm &/or Wo Cm  07/18/2013   CLINICAL DATA:  Fever and cough  EXAM: CT ANGIOGRAPHY CHEST WITH CONTRAST  TECHNIQUE: Multidetector CT imaging of the chest was performed using the standard protocol during bolus administration of intravenous contrast. Multiplanar CT image reconstructions including MIPs were obtained to evaluate the vascular anatomy.  CONTRAST:  OMNIPAQUE IOHEXOL 350 MG/ML SOLN  COMPARISON:  Chest radiograph same date. No prior CT for comparison.  FINDINGS: Heart size is normal. No pericardial or pleural effusion. Mild right hilar nodal prominence with representative node measuring 5 mm image 49.  No focal filling defect is identified up to and including the 3rd order  pulmonary arteries to suggest acute pulmonary embolism. The examination is technically adequate for evaluation for pulmonary emboli up to and including the 3rd order pulmonary arteries.  Incomplete imaging of the upper abdomen demonstrates incomplete visualization of low-density left renal cortical lesion image 104, statistically less likely a cyst but not further evaluated.  Diffuse severe emphysematous changes are noted with central bronchial wall thickening and right greater than left apical pleural thickening/scarring. No pneumothorax. No focal pulmonary opacity. Central airways are patent. No acute osseous finding.  Review of the MIP images confirms the above findings.  IMPRESSION: No CT evidence for acute pulmonary embolism.  Diffuse severe emphysematous change with central bronchial wall thickening which could indicate superimposed chronic bronchitis. No focal pulmonary opacification.   Electronically Signed   By: Christiana Pellant M.D.   On: 07/18/2013 00:04    MDM   1. Weakness   2. Cough   3. Hypokalemia   4. Dyspnea   5. Bronchitis  6. CHF (congestive heart failure)    Pt sent from [facility] for generalized weakness, cough, right sided pain.  Pt is poor historian due to severe hearing loss and difficulty communicating via pen and paper.  Pt not normally on oxygen, requiring 2L via nasal canula.  Discussed pt with Dr. Criss Alvine.  Will work pt up for possible PE as pt is poor historian.  CBC: WBC-17.8 BMP: K-2.9, hypokalemia. K-dur given. CXR: changes consistent with congestive failure.  BNP-730.8, was 206.   Called [facility] but denies known hx of cardiac problems.  Pt is a smoker and does have asthma  CT angio: no evidence of PE.     Due to pt's hypoxia and probably new onset CHF, will consult hospitalist to admit pt. 12:14 AM Pt is stable at this time.  WIll discuss starting antibiotic with white count.    EKG: old infarcts.  Negative troponin.  12:19 AM RN stated pt had  BP of 91/60 in right arm but 100/68 in left arm.  Will remove nasal canula and watch O2 as monitor was placed on ear lobe. Pt still shivering and has dry cough.  12:46 AM O2 dropped to 82% again on RA.  Will place pt back on [place].  Discussed pt with Dr. Jodi Mourning who will reevaluate patient and consult hospitalist.  Pt will be admitted to telemetry.         Junius Finner, PA-C 07/18/13 0116

## 2013-07-18 ENCOUNTER — Encounter (HOSPITAL_COMMUNITY): Payer: Self-pay | Admitting: *Deleted

## 2013-07-18 DIAGNOSIS — E876 Hypokalemia: Secondary | ICD-10-CM

## 2013-07-18 DIAGNOSIS — R0902 Hypoxemia: Secondary | ICD-10-CM | POA: Diagnosis present

## 2013-07-18 DIAGNOSIS — J441 Chronic obstructive pulmonary disease with (acute) exacerbation: Secondary | ICD-10-CM | POA: Diagnosis present

## 2013-07-18 DIAGNOSIS — E039 Hypothyroidism, unspecified: Secondary | ICD-10-CM

## 2013-07-18 DIAGNOSIS — J209 Acute bronchitis, unspecified: Secondary | ICD-10-CM

## 2013-07-18 DIAGNOSIS — I959 Hypotension, unspecified: Secondary | ICD-10-CM

## 2013-07-18 DIAGNOSIS — J42 Unspecified chronic bronchitis: Secondary | ICD-10-CM

## 2013-07-18 DIAGNOSIS — R0602 Shortness of breath: Secondary | ICD-10-CM | POA: Diagnosis present

## 2013-07-18 DIAGNOSIS — D72829 Elevated white blood cell count, unspecified: Secondary | ICD-10-CM

## 2013-07-18 DIAGNOSIS — I509 Heart failure, unspecified: Secondary | ICD-10-CM

## 2013-07-18 DIAGNOSIS — R5381 Other malaise: Secondary | ICD-10-CM

## 2013-07-18 DIAGNOSIS — R64 Cachexia: Secondary | ICD-10-CM | POA: Diagnosis present

## 2013-07-18 LAB — COMPREHENSIVE METABOLIC PANEL
ALT: 10 U/L (ref 0–53)
AST: 8 U/L (ref 0–37)
BUN: 11 mg/dL (ref 6–23)
CO2: 24 mEq/L (ref 19–32)
Calcium: 8.3 mg/dL — ABNORMAL LOW (ref 8.4–10.5)
GFR calc Af Amer: >90 mL/min (ref >90)
GFR calc non Af Amer: >90 mL/min (ref >90)
Glucose, Bld: 64 mg/dL — ABNORMAL LOW (ref 70–99)
Potassium: 3 mEq/L — ABNORMAL LOW (ref 3.5–5.1)
Sodium: 136 mEq/L (ref 135–145)
Total Protein: 6.4 g/dL (ref 6.0–8.3)

## 2013-07-18 LAB — PREALBUMIN: Prealbumin: 3.8 mg/dL — ABNORMAL LOW (ref 17.0–34.0)

## 2013-07-18 LAB — MAGNESIUM: Magnesium: 1.5 mg/dL (ref 1.5–2.5)

## 2013-07-18 LAB — CBC
Hemoglobin: 10.3 g/dL — ABNORMAL LOW (ref 13.0–17.0)
RBC: 3.41 MIL/uL — ABNORMAL LOW (ref 4.22–5.81)

## 2013-07-18 LAB — MRSA PCR SCREENING: MRSA by PCR: POSITIVE — AB

## 2013-07-18 LAB — CORTISOL: Cortisol, Plasma: 4.7 ug/dL

## 2013-07-18 MED ORDER — DARIFENACIN HYDROBROMIDE ER 7.5 MG PO TB24
7.5000 mg | ORAL_TABLET | Freq: Every day | ORAL | Status: DC
  Administered 2013-07-18 – 2013-07-21 (×4): 7.5 mg via ORAL
  Filled 2013-07-18 (×4): qty 1

## 2013-07-18 MED ORDER — GUAIFENESIN ER 600 MG PO TB12
1200.0000 mg | ORAL_TABLET | Freq: Two times a day (BID) | ORAL | Status: DC
  Administered 2013-07-18 – 2013-07-21 (×8): 1200 mg via ORAL
  Filled 2013-07-18 (×10): qty 2

## 2013-07-18 MED ORDER — METHYLPREDNISOLONE SODIUM SUCC 125 MG IJ SOLR
60.0000 mg | Freq: Two times a day (BID) | INTRAMUSCULAR | Status: DC
  Administered 2013-07-18 – 2013-07-19 (×3): 60 mg via INTRAVENOUS
  Filled 2013-07-18 (×4): qty 0.96

## 2013-07-18 MED ORDER — POTASSIUM CHLORIDE 10 MEQ/100ML IV SOLN
10.0000 meq | INTRAVENOUS | Status: AC
  Administered 2013-07-18 (×2): 10 meq via INTRAVENOUS
  Filled 2013-07-18 (×2): qty 100

## 2013-07-18 MED ORDER — PHENYTOIN SODIUM EXTENDED 100 MG PO CAPS
300.0000 mg | ORAL_CAPSULE | Freq: Every day | ORAL | Status: DC
  Administered 2013-07-18 – 2013-07-20 (×4): 300 mg via ORAL
  Filled 2013-07-18 (×6): qty 3

## 2013-07-18 MED ORDER — DOXEPIN HCL 50 MG PO CAPS
50.0000 mg | ORAL_CAPSULE | Freq: Every day | ORAL | Status: DC
  Administered 2013-07-18 – 2013-07-20 (×4): 50 mg via ORAL
  Filled 2013-07-18 (×6): qty 1

## 2013-07-18 MED ORDER — ONDANSETRON HCL 4 MG/2ML IJ SOLN: 4.0000 mg | Freq: Four times a day (QID) | INTRAMUSCULAR | Status: DC | PRN

## 2013-07-18 MED ORDER — MEGESTROL ACETATE 40 MG/ML PO SUSP
400.0000 mg | Freq: Two times a day (BID) | ORAL | Status: DC
  Administered 2013-07-18 – 2013-07-21 (×7): 400 mg via ORAL
  Filled 2013-07-18 (×8): qty 10

## 2013-07-18 MED ORDER — MIRTAZAPINE 7.5 MG PO TABS
22.5000 mg | ORAL_TABLET | Freq: Every day | ORAL | Status: DC
  Administered 2013-07-18 – 2013-07-20 (×4): 22.5 mg via ORAL
  Filled 2013-07-18 (×5): qty 1

## 2013-07-18 MED ORDER — VANCOMYCIN HCL IN DEXTROSE 1-5 GM/200ML-% IV SOLN
1000.0000 mg | Freq: Every day | INTRAVENOUS | Status: DC
  Administered 2013-07-18 – 2013-07-20 (×3): 1000 mg via INTRAVENOUS
  Filled 2013-07-18 (×3): qty 200

## 2013-07-18 MED ORDER — SIMVASTATIN 10 MG PO TABS
10.0000 mg | ORAL_TABLET | Freq: Every day | ORAL | Status: DC
  Administered 2013-07-18 – 2013-07-20 (×4): 10 mg via ORAL
  Filled 2013-07-18 (×5): qty 1

## 2013-07-18 MED ORDER — LORATADINE 10 MG PO TABS
10.0000 mg | ORAL_TABLET | Freq: Every day | ORAL | Status: DC
  Administered 2013-07-18 – 2013-07-21 (×4): 10 mg via ORAL
  Filled 2013-07-18 (×4): qty 1

## 2013-07-18 MED ORDER — INFLUENZA VAC SPLIT QUAD 0.5 ML IM SUSP
0.5000 mL | Freq: Once | INTRAMUSCULAR | Status: AC
  Filled 2013-07-18: qty 0.5

## 2013-07-18 MED ORDER — FLUOXETINE HCL 20 MG PO CAPS: 20.0000 mg | ORAL_CAPSULE | Freq: Every day | ORAL | Status: DC

## 2013-07-18 MED ORDER — IPRATROPIUM BROMIDE 0.02 % IN SOLN
0.5000 mg | RESPIRATORY_TRACT | Status: DC
  Administered 2013-07-18 – 2013-07-19 (×10): 0.5 mg via RESPIRATORY_TRACT
  Filled 2013-07-18 (×11): qty 2.5

## 2013-07-18 MED ORDER — ZOLPIDEM TARTRATE 5 MG PO TABS: 5.0000 mg | ORAL_TABLET | Freq: Every evening | ORAL | Status: DC | PRN

## 2013-07-18 MED ORDER — DEXAMETHASONE SODIUM PHOSPHATE 10 MG/ML IJ SOLN
10.0000 mg | Freq: Once | INTRAMUSCULAR | Status: DC
  Filled 2013-07-18: qty 1

## 2013-07-18 MED ORDER — LEVOFLOXACIN IN D5W 750 MG/150ML IV SOLN: 750.0000 mg | INTRAVENOUS | Status: DC

## 2013-07-18 MED ORDER — SODIUM CHLORIDE 0.9 % IV SOLN
INTRAVENOUS | Status: DC
  Administered 2013-07-18: 100 mL/h via INTRAVENOUS

## 2013-07-18 MED ORDER — ONDANSETRON HCL 4 MG PO TABS: 4.0000 mg | ORAL_TABLET | Freq: Four times a day (QID) | ORAL | Status: DC | PRN

## 2013-07-18 MED ORDER — ACETAMINOPHEN 325 MG PO TABS: 650.0000 mg | ORAL_TABLET | Freq: Four times a day (QID) | ORAL | Status: DC | PRN

## 2013-07-18 MED ORDER — ENOXAPARIN SODIUM 40 MG/0.4ML ~~LOC~~ SOLN
40.0000 mg | SUBCUTANEOUS | Status: DC
  Administered 2013-07-18 – 2013-07-20 (×3): 40 mg via SUBCUTANEOUS
  Filled 2013-07-18 (×4): qty 0.4

## 2013-07-18 MED ORDER — OSELTAMIVIR PHOSPHATE 75 MG PO CAPS: 75.0000 mg | ORAL_CAPSULE | Freq: Two times a day (BID) | ORAL | Status: DC

## 2013-07-18 MED ORDER — PIPERACILLIN-TAZOBACTAM 3.375 G IVPB
3.3750 g | Freq: Three times a day (TID) | INTRAVENOUS | Status: DC
  Administered 2013-07-18 – 2013-07-20 (×6): 3.375 g via INTRAVENOUS
  Filled 2013-07-18 (×8): qty 50

## 2013-07-18 MED ORDER — ENSURE COMPLETE PO LIQD
237.0000 mL | Freq: Three times a day (TID) | ORAL | Status: DC
  Administered 2013-07-18 – 2013-07-21 (×7): 237 mL via ORAL

## 2013-07-18 MED ORDER — CHLORHEXIDINE GLUCONATE 0.12 % MT SOLN
15.0000 mL | Freq: Two times a day (BID) | OROMUCOSAL | Status: DC
  Administered 2013-07-18 – 2013-07-21 (×7): 15 mL via OROMUCOSAL
  Filled 2013-07-18 (×9): qty 15

## 2013-07-18 MED ORDER — POTASSIUM CHLORIDE CRYS ER 20 MEQ PO TBCR: 40.0000 meq | EXTENDED_RELEASE_TABLET | ORAL | Status: DC

## 2013-07-18 MED ORDER — DEXAMETHASONE SODIUM PHOSPHATE 10 MG/ML IJ SOLN: 10.0000 mg | Freq: Once | INTRAMUSCULAR | Status: DC

## 2013-07-18 MED ORDER — ALBUTEROL SULFATE (5 MG/ML) 0.5% IN NEBU
2.5000 mg | INHALATION_SOLUTION | RESPIRATORY_TRACT | Status: DC
  Administered 2013-07-18 – 2013-07-19 (×10): 2.5 mg via RESPIRATORY_TRACT
  Filled 2013-07-18 (×11): qty 0.5

## 2013-07-18 MED ORDER — FERROUS SULFATE 325 (65 FE) MG PO TABS
325.0000 mg | ORAL_TABLET | Freq: Every day | ORAL | Status: DC
  Administered 2013-07-18 – 2013-07-21 (×4): 325 mg via ORAL
  Filled 2013-07-18 (×5): qty 1

## 2013-07-18 MED ORDER — OLANZAPINE 7.5 MG PO TABS
15.0000 mg | ORAL_TABLET | Freq: Every day | ORAL | Status: DC
  Administered 2013-07-18 – 2013-07-20 (×4): 15 mg via ORAL
  Filled 2013-07-18 (×5): qty 2

## 2013-07-18 MED ORDER — ALPRAZOLAM 0.5 MG PO TABS
0.5000 mg | ORAL_TABLET | Freq: Every day | ORAL | Status: DC
  Administered 2013-07-19 – 2013-07-21 (×3): 0.5 mg via ORAL
  Filled 2013-07-18 (×3): qty 1

## 2013-07-18 MED ORDER — BIOTENE DRY MOUTH MT LIQD
15.0000 mL | Freq: Two times a day (BID) | OROMUCOSAL | Status: DC
  Administered 2013-07-18 – 2013-07-20 (×6): 15 mL via OROMUCOSAL

## 2013-07-18 MED ORDER — PANTOPRAZOLE SODIUM 40 MG PO TBEC
40.0000 mg | DELAYED_RELEASE_TABLET | Freq: Every day | ORAL | Status: DC
  Administered 2013-07-18 – 2013-07-21 (×4): 40 mg via ORAL
  Filled 2013-07-18 (×4): qty 1

## 2013-07-18 MED ORDER — FLUOXETINE HCL 10 MG PO CAPS
30.0000 mg | ORAL_CAPSULE | Freq: Every day | ORAL | Status: DC
  Administered 2013-07-18 – 2013-07-21 (×4): 30 mg via ORAL
  Filled 2013-07-18 (×4): qty 3

## 2013-07-18 MED ORDER — BENZTROPINE MESYLATE 0.5 MG PO TABS
0.5000 mg | ORAL_TABLET | Freq: Two times a day (BID) | ORAL | Status: DC
  Administered 2013-07-18 – 2013-07-21 (×8): 0.5 mg via ORAL
  Filled 2013-07-18 (×9): qty 1

## 2013-07-18 MED ORDER — LEVOFLOXACIN IN D5W 500 MG/100ML IV SOLN
500.0000 mg | INTRAVENOUS | Status: DC
  Filled 2013-07-18 (×2): qty 100

## 2013-07-18 MED ORDER — HYDROMORPHONE HCL PF 1 MG/ML IJ SOLN
0.5000 mg | INTRAMUSCULAR | Status: DC | PRN
Start: 2013-07-18 — End: 2013-07-21

## 2013-07-18 MED ORDER — LEVOTHYROXINE SODIUM 75 MCG PO TABS
75.0000 ug | ORAL_TABLET | Freq: Every day | ORAL | Status: DC
  Administered 2013-07-18 – 2013-07-21 (×4): 75 ug via ORAL
  Filled 2013-07-18 (×5): qty 1

## 2013-07-18 MED ORDER — POLYETHYLENE GLYCOL 3350 17 G PO PACK
17.0000 g | PACK | Freq: Every day | ORAL | Status: DC
  Administered 2013-07-18 – 2013-07-21 (×3): 17 g via ORAL
  Filled 2013-07-18 (×4): qty 1

## 2013-07-18 MED ORDER — ALUM & MAG HYDROXIDE-SIMETH 200-200-20 MG/5ML PO SUSP: 30.0000 mL | Freq: Four times a day (QID) | ORAL | Status: DC | PRN

## 2013-07-18 MED ORDER — POLYETHYLENE GLYCOL 3350 17 GM/SCOOP PO POWD
17.0000 g | Freq: Every day | ORAL | Status: DC
  Filled 2013-07-18: qty 255

## 2013-07-18 MED ORDER — ACETAMINOPHEN 650 MG RE SUPP: 650.0000 mg | Freq: Four times a day (QID) | RECTAL | Status: DC | PRN

## 2013-07-18 MED ORDER — SODIUM CHLORIDE 0.9 % IV BOLUS (SEPSIS)
1000.0000 mL | Freq: Once | INTRAVENOUS | Status: AC
  Administered 2013-07-18: 1000 mL via INTRAVENOUS

## 2013-07-18 NOTE — ED Notes (Signed)
Room air o2 sat reflect 87% , added 2 l o2 increased to 96% ( with good pleth)

## 2013-07-18 NOTE — Progress Notes (Signed)
ANTIBIOTIC CONSULT NOTE - INITIAL  Pharmacy Consult for Vancomycin Indication: rule out pneumonia  Allergies  Allergen Reactions  . Fexofenadine Hcl Other (See Comments)    unknown  . Percocet [Oxycodone-Acetaminophen] Other (See Comments)    unknown  . Thorazine [Chlorpromazine Hcl] Other (See Comments)    unknown    Patient Measurements: Height: 5\' 6"  (167.6 cm) Weight: 97 lb (43.999 kg) IBW/kg (Calculated) : 63.8 Adjusted Body Weight:   Vital Signs: Temp: 98 F (36.7 C) (10/05 0202) Temp src: Oral (10/05 0202) BP: 102/62 mmHg (10/05 0202) Pulse Rate: 92 (10/05 0202) Intake/Output from previous day:   Intake/Output from this shift:    Labs:  Recent Labs  07/17/13 2025 07/18/13 0231  WBC 17.8*  --   HGB 11.0*  --   PLT 383  --   CREATININE 1.04 0.96   Estimated Creatinine Clearance: 61.7 ml/min (by C-G formula based on Cr of 0.96). No results found for this basename: VANCOTROUGH, VANCOPEAK, VANCORANDOM, GENTTROUGH, GENTPEAK, GENTRANDOM, TOBRATROUGH, TOBRAPEAK, TOBRARND, AMIKACINPEAK, AMIKACINTROU, AMIKACIN,  in the last 72 hours   Microbiology: No results found for this or any previous visit (from the past 720 hour(s)).  Medical History: Past Medical History  Diagnosis Date  . Chronic pain   . Anxiety   . Schizoaffective disorder   . Hearing loss   . Seizure   . Arthritis   . Asthma   . Foot drop   . Anemia   . Dehydration   . Hypotension   . GERD (gastroesophageal reflux disease)   . Hypothyroidism   . Constipation   . Polysubstance abuse   . Cancer     Medications:  Anti-infectives   Start     Dose/Rate Route Frequency Ordered Stop   07/18/13 2300  levofloxacin (LEVAQUIN) IVPB 500 mg     500 mg 100 mL/hr over 60 Minutes Intravenous Every 24 hours 07/18/13 0125     07/18/13 0400  vancomycin (VANCOCIN) IVPB 1000 mg/200 mL premix     1,000 mg 200 mL/hr over 60 Minutes Intravenous Daily 07/18/13 0310     07/18/13 0315  oseltamivir  (TAMIFLU) capsule 75 mg  Status:  Discontinued     75 mg Oral 2 times daily 07/18/13 0300 07/18/13 0301   07/18/13 0115  levofloxacin (LEVAQUIN) IVPB 750 mg  Status:  Discontinued     750 mg 100 mL/hr over 90 Minutes Intravenous Every 24 hours 07/18/13 0114 07/18/13 0202     Assessment: Patient with possible PNA.  First dose of antibiotics already given  Goal of Therapy:  Vancomycin trough level 15-20 mcg/ml  Plan:  Measure antibiotic drug levels at steady state Follow up culture results Vancomycin 1gm iv q24hr  [name] [name], [name] [name] 07/18/2013,4:35 AM

## 2013-07-18 NOTE — H&P (Addendum)
[facility] History and Physical  Deny Chevez WUJ:[id number] DOB: [birth year] DOA: 07/17/2013  Referring physician:  EDP PCP: Default, Provider, MD  Specialists:   Chief Complaint:   SOB Fever Chills Cough  HPI: [name] [name] is a 42 y.o. male from the [facility] who was sent to the ED due to increased weakness and complaints of fevers and chills for 2 weeks.  He also reports having increased cough for thee past 2 months and reports coughing up yellow sputum.   He also had complaints of right sided flank pain tonight and was sent to the ED for evaluation.  He has a history of Schizophrenia and is hearing impaired and is a poor historian.   The medical history was obtained from contacting [facility] and through the medical records and from the limited information obtained from the patient.     He was found to be hypoxemic and was placed on 3 liter NCO2 with improvement.  He was evaluated in the ED and underwent a CXR and CTA of the Chest  Which was negative for pulmonary emboli, and the CXR was read as Congestive Heart Failure and also had a BNP= 730.8, however he was hypotensive.   He was referred for admission.      Review of Systems:  Unable to Obtain from the Patient    Past Medical History  Diagnosis Date  . Chronic pain   . Anxiety   . Schizoaffective disorder   . Hearing loss   . Seizure   . Arthritis   . Asthma   . Foot drop   . Anemia   . Dehydration   . Hypotension   . GERD (gastroesophageal reflux disease)   . Hypothyroidism   . Constipation   . Polysubstance abuse     History reviewed. No pertinent past surgical history.  Prior to Admission medications   Medication Sig Start Date End Date Taking? Authorizing Provider  ALPRAZolam (XANAX) 0.5 MG tablet Take 1 tablet (0.5 mg total) by mouth daily at 12 noon. 03/13/13  Yes [name], MD  benztropine (COGENTIN) 0.5 MG tablet Take 0.5 mg by mouth 2 (two) times daily. Given 2 hours  after thyroid medication   Yes Historical Provider, MD  doxepin (SINEQUAN) 50 MG capsule Take 50 mg by mouth at bedtime.   Yes Historical Provider, MD  ferrous sulfate 325 (65 FE) MG tablet Take 325 mg by mouth daily with breakfast.   Yes Historical Provider, MD  FLUoxetine (PROZAC) 10 MG capsule Take 10 mg by mouth daily.   Yes Historical Provider, MD  FLUoxetine (PROZAC) 20 MG capsule Take 20 mg by mouth daily. With 10 mg to = 30 mg   Yes Historical Provider, MD  Fluticasone-Salmeterol (ADVAIR) 100-50 MCG/DOSE AEPB Inhale 1 puff into the lungs 2 (two) times daily.   Yes Historical Provider, MD  levothyroxine (SYNTHROID, LEVOTHROID) 75 MCG tablet Take 75 mcg by mouth daily.   Yes Historical Provider, MD  loratadine (CLARITIN) 10 MG tablet Take 10 mg by mouth daily.   Yes Historical Provider, MD  mirtazapine (REMERON) 45 MG tablet Take 22.5 mg by mouth at bedtime.   Yes Historical Provider, MD  Nutritional Supplements (ENSURE PO) Take 1 Can by mouth 3 (three) times daily with meals.   Yes Historical Provider, MD  OLANZapine (ZYPREXA) 15 MG tablet Take 15 mg by mouth at bedtime.   Yes Historical Provider, MD  omeprazole (PRILOSEC) 40 MG capsule Take 40 mg by mouth  daily.    Yes Historical Provider, MD  phenytoin (DILANTIN) 100 MG ER capsule Take 300 mg by mouth at bedtime.   Yes Historical Provider, MD  polyethylene glycol powder (GLYCOLAX/MIRALAX) powder Take 17 g by mouth daily.   Yes Historical Provider, MD  simvastatin (ZOCOR) 10 MG tablet Take 10 mg by mouth at bedtime.   Yes Historical Provider, MD  solifenacin (VESICARE) 5 MG tablet Take 5 mg by mouth daily.    Yes Historical Provider, MD  sucralfate (CARAFATE) 1 G tablet Take 1 g by mouth 4 (four) times daily. 06/03/12 07/17/13 Yes Olivia Mackie, MD  traMADol (ULTRAM) 50 MG tablet Take 50 mg by mouth every 6 (six) hours as needed for pain (pain).   Yes Historical Provider, MD  zolpidem (AMBIEN) 5 MG tablet Take 5 mg by mouth at bedtime.   Yes  Historical Provider, MD  acetaminophen (TYLENOL) 325 MG tablet Take 650 mg by mouth every 6 (six) hours as needed. Pain    Historical Provider, MD  albuterol (PROVENTIL HFA;VENTOLIN HFA) 108 (90 BASE) MCG/ACT inhaler Inhale 2 puffs into the lungs every 4 (four) hours as needed. For wheezing.    Historical Provider, MD  guaifenesin (ROBITUSSIN) 100 MG/5ML syrup Take 200 mg by mouth every 4 (four) hours as needed for cough.    Historical Provider, MD  megestrol (MEGACE) 40 MG/ML suspension Take 400 mg by mouth 2 (two) times daily.     Historical Provider, MD  ondansetron (ZOFRAN ODT) 4 MG disintegrating tablet Take 1 tablet (4 mg total) by mouth every 8 (eight) hours as needed for nausea. 03/13/13   [name], MD  ondansetron (ZOFRAN) 4 MG tablet Take 4 mg by mouth every 8 (eight) hours as needed for nausea.    Historical Provider, MD    Allergies  Allergen Reactions  . Fexofenadine Hcl Other (See Comments)    unknown  . Percocet [Oxycodone-Acetaminophen] Other (See Comments)    unknown  . Thorazine [Chlorpromazine Hcl] Other (See Comments)    unknown    Social History:  reports that he has never smoked. He has never used smokeless tobacco. He reports that he does not drink alcohol or use illicit drugs.     No family history on file.  Unable to Obtain from the Patient   Physical Exam:  GEN:  Pleasant  Cachectic  42 y.o. Caucasian male  examined  and in no acute distress; cooperative with exam Filed Vitals:   07/17/13 2217 07/17/13 2322 07/18/13 0014 07/18/13 0052  BP:  91/60 100/68   Pulse:  100 93   Temp:  98.4 F (36.9 C) 98.3 F (36.8 C) 98.6 F (37 C)  TempSrc:  Oral Oral Rectal  Resp:  20 15   SpO2: 99% 100% 100%    Blood pressure 100/68, pulse 93, temperature 98.6 F (37 C), temperature source Rectal, resp. rate 15, SpO2 100.00%. PSYCH: He is alert and oriented x4; does not appear anxious does not appear depressed; affect is normal HEENT: Normocephalic and Atraumatic,  Mucous membranes pink; PERRLA; EOM intact; Fundi:  Benign;  No scleral icterus, Nares: Patent, Oropharynx: Clear, Fair Dentition, Neck:  FROM, no cervical lymphadenopathy nor thyromegaly or carotid bruit; no JVD; Breasts:: Not examined CHEST WALL: No tenderness CHEST: Normal respiration, clear to auscultation bilaterally HEART: Regular rate and rhythm; no murmurs rubs or gallops BACK: No kyphosis or scoliosis; no CVA tenderness ABDOMEN: Positive Bowel Sounds, Scaphoid, soft non-tender; no masses, no organomegaly. Rectal Exam: Not done  EXTREMITIES: No cyanosis, clubbing or edema; no ulcerations. Genitalia: not examined PULSES: 2+ and symmetric SKIN: Normal hydration no rash or ulceration CNS:  Generalized weakness, CN II- XII grossly intact  Except for decreased hearing, No Focal deficits.       Labs on Admission:  Basic Metabolic Panel:  Recent Labs Lab 07/17/13 2025  NA 138  K 2.9*  CL 100  CO2 25  GLUCOSE 76  BUN 12  CREATININE 1.04  CALCIUM 8.0*   Liver Function Tests: No results found for this basename: AST, ALT, ALKPHOS, BILITOT, PROT, ALBUMIN,  in the last 168 hours No results found for this basename: LIPASE, AMYLASE,  in the last 168 hours No results found for this basename: AMMONIA,  in the last 168 hours CBC:  Recent Labs Lab 07/17/13 2025  WBC 17.8*  HGB 11.0*  HCT 31.6*  MCV 88.8  PLT 383   Cardiac Enzymes:  Recent Labs Lab 07/17/13 2140  TROPONINI <0.30    BNP (last 3 results)  Recent Labs  07/17/13 2048  PROBNP 730.8*   CBG: No results found for this basename: GLUCAP,  in the last 168 hours  Radiological Exams on Admission: Dg Chest 2 View  07/17/2013   CLINICAL DATA:  Shortness of breath  EXAM: CHEST  2 VIEW  COMPARISON:  03/09/2013  FINDINGS: The cardiac shadow is stable. Some diffuse chronic interstitial changes are again noted. Increased vascular congestion with interstitial edema is noted. No focal confluent infiltrate is seen.   IMPRESSION: Changes consistent with congestive failure.   Electronically Signed   By: Alcide Clever M.D.   On: 07/17/2013 20:39   Ct Angio Chest Pe W/cm &/or Wo Cm  07/18/2013   CLINICAL DATA:  Fever and cough  EXAM: CT ANGIOGRAPHY CHEST WITH CONTRAST  TECHNIQUE: Multidetector CT imaging of the chest was performed using the standard protocol during bolus administration of intravenous contrast. Multiplanar CT image reconstructions including MIPs were obtained to evaluate the vascular anatomy.  CONTRAST:  OMNIPAQUE IOHEXOL 350 MG/ML SOLN  COMPARISON:  Chest radiograph same date. No prior CT for comparison.  FINDINGS: Heart size is normal. No pericardial or pleural effusion. Mild right hilar nodal prominence with representative node measuring 5 mm image 49.  No focal filling defect is identified up to and including the 3rd order pulmonary arteries to suggest acute pulmonary embolism. The examination is technically adequate for evaluation for pulmonary emboli up to and including the 3rd order pulmonary arteries.  Incomplete imaging of the upper abdomen demonstrates incomplete visualization of low-density left renal cortical lesion image 104, statistically less likely a cyst but not further evaluated.  Diffuse severe emphysematous changes are noted with central bronchial wall thickening and right greater than left apical pleural thickening/scarring. No pneumothorax. No focal pulmonary opacity. Central airways are patent. No acute osseous finding.  Review of the MIP images confirms the above findings.  IMPRESSION: No CT evidence for acute pulmonary embolism.  Diffuse severe emphysematous change with central bronchial wall thickening which could indicate superimposed chronic bronchitis. No focal pulmonary opacification.   Electronically Signed   By: Christiana Pellant M.D.   On: 07/18/2013 00:04     EKG: Independently reviewed.  Normal sinus Rhythm No acute S-T changes.         Assessment/Plan Principal  Problem:   SOB (shortness of breath) Active Problems:   Chronic bronchitis with acute exacerbation   Hypoxemia   Hypotension   Leukocytosis, unspecified   Hypothyroidism   Hypokalemia  Cachexia   1.   SOB- Multifactorial - due to Acute on Chronic Bronchitis /COPD, and ? Early PNA.   Placed on Supplemental O2, and DuoNebs and IV Levaquin and IV Vanc ordered.          2.   Chronic Bronchitis with Exacerbation-  See #1.    3.   Hypoxemia die to #1.  Improved with NCO2.    4.    Hypotension-  ? Early Sepsis,   Empiric IV Vanc and Levaquin. check Cortisol level.       5.   Leukocytosis-  Monitor Trend, ? Early Sepsis.     6.   Hypothyroidism-  Continue Levothyroxine, and check TSH.     7.   Hypokalemia-   Replte K= and check Magnesium level.     8.   Cachexia- on Megace Rx and Ensure Supplements,   Check Albumin level.  Suspect Low albumin.     9.   CHF vs Hypoalbuminemia-  Can not diurese at this time due to hypotension.  Monitor O2 levels.       Code Status:   FULL CODE Family Communication:   No Family Present Disposition Plan:     Inpatient  Time spent:  37 Minutes  Ron Parker [facility] Pager [phone]  If 7PM-7AM, please contact night-coverage [email or web address] Password [email or web address] 07/18/2013, 1:42 AM

## 2013-07-18 NOTE — Progress Notes (Signed)
ANTIBIOTIC CONSULT NOTE - INITIAL  Pharmacy Consult for Zosyn Indication: rule out pneumonia  Allergies  Allergen Reactions  . Fexofenadine Hcl Other (See Comments)    unknown  . Percocet [Oxycodone-Acetaminophen] Other (See Comments)    unknown  . Thorazine [Chlorpromazine Hcl] Other (See Comments)    unknown    Patient Measurements: Height: 5\' 6"  (167.6 cm) Weight: 97 lb (43.999 kg) IBW/kg (Calculated) : 63.8  Vital Signs: Temp: 98.2 F (36.8 C) (10/05 1359) Temp src: Oral (10/05 1359) BP: 92/56 mmHg (10/05 1400) Pulse Rate: 109 (10/05 1359) Intake/Output from previous day: 10/04 0701 - 10/05 0700 In: 605 [I.V.:305; IV Piggyback:300] Out: -  Intake/Output from this shift: Total I/O In: -  Out: 725 [Urine:725]  Labs:  Recent Labs  07/17/13 2025 07/18/13 0231 07/18/13 0455  WBC 17.8*  --  14.2*  HGB 11.0*  --  10.3*  PLT 383  --  384  CREATININE 1.04 0.96  --    Estimated Creatinine Clearance: 61.7 ml/min (by C-G formula based on Cr of 0.96). No results found for this basename: VANCOTROUGH, Leodis Binet, VANCORANDOM, GENTTROUGH, GENTPEAK, GENTRANDOM, TOBRATROUGH, TOBRAPEAK, TOBRARND, AMIKACINPEAK, AMIKACINTROU, AMIKACIN,  in the last 72 hours   Microbiology: Recent Results (from the past 720 hour(s))  MRSA PCR SCREENING     Status: Abnormal   Collection Time    07/18/13  2:56 AM      Result Value Range Status   MRSA by PCR POSITIVE (*) NEGATIVE Final   Comment:            The GeneXpert MRSA Assay (FDA     approved for NASAL specimens     only), is one component of a     comprehensive MRSA colonization     surveillance program. It is not     intended to diagnose MRSA     infection nor to guide or     monitor treatment for     MRSA infections.     RESULT CALLED TO, READ BACK BY AND VERIFIED WITH:     CALLED TO RN Irving Burton HARLESS [id number] @0733  THANEY     Performed at [facility]    Medical History: Past Medical History  Diagnosis Date  .  Chronic pain   . Anxiety   . Schizoaffective disorder   . Hearing loss   . Seizure   . Arthritis   . Asthma   . Foot drop   . Anemia   . Dehydration   . Hypotension   . GERD (gastroesophageal reflux disease)   . Hypothyroidism   . Constipation   . Polysubstance abuse   . Cancer      Assessment: 70 yoM from assisted living center presents with SOB and fevers/chills x 2 weeks with increased productive cough.  Broad spectrum antibiotics started for acute on chronic bronchitis/COPD and possibly early HCAP. CXR consistent with CHF. CTA showed no PE.  Blood cultures sent.  Currently afebrile. WBCs: 14.2 Renal: SCr 0.96, CrCl ~61 ml/min  Day #1 Vancomycin 1g IV q24h.  Pharmacy consulted to begin Zosyn as well.   Goal of Therapy:  Vancomycin trough level 15-20 mcg/ml  Plan:  Begin Zosyn 3.375g IV q8h (4 hour infusion time). F/u SCr, culture results.  Clance Boll 07/18/2013,3:44 PM

## 2013-07-18 NOTE — ED Notes (Signed)
Trial off o2- patient desat to 82 . Replaced o2 on at 3l sat- 92%

## 2013-07-18 NOTE — Progress Notes (Signed)
[facility] PROGRESS NOTE  Petr Bontempo WGN:[id number] DOB: 05/18/1971 DOA: 07/17/2013 PCP: Default, Provider, MD  Assessment/Plan: Shortness of breath : probably from copd exacerbation vs early pneumonia. Pt came in with low grade temp, sob and chills. He was hypotensive and tachycardic. Started on him on broad spectrum antibiotics and IV steroids. Nasal oxygen as needed. Nebs as needed.   2.. Hypotension- ? Early Sepsis, Empiric IV Vanc and zosyn. check Cortisol level.  5. Leukocytosis- Monitor Trend, ? Early Sepsis.  6. Hypothyroidism- Continue Levothyroxine, and check TSH.  7. Hypokalemia- Replte K= and check Magnesium level.  8. Cachexia- on Megace Rx and Ensure Supplements, Check Albumin level. Suspect Low albumin.  9. CHF vs Hypoalbuminemia- Can not diurese at this time due to hypotension. Monitor O2 levels.  Code Status: full code Family Communication: none at bedside Disposition Plan: pending.   Consultants:  none  Procedures:  none  Antibiotics:  Zosyn  vancomycin  HPI/Subjective: Sleeping comfortably  Objective: Filed Vitals:   07/18/13 1400  BP: 92/56  Pulse:   Temp:   Resp:     Intake/Output Summary (Last 24 hours) at 07/18/13 1505 Last data filed at 07/18/13 1122  Gross per 24 hour  Intake    605 ml  Output    725 ml  Net   -120 ml   Filed Weights   07/18/13 0202  Weight: 43.999 kg (97 lb)    Exam:   General:  Sleeping comfortably  Cardiovascular: s1s2  Data Reviewed: Basic Metabolic Panel:  Recent Labs Lab 07/17/13 2025 07/18/13 0231  NA 138 136  K 2.9* 3.0*  CL 100 98  CO2 25 24  GLUCOSE 76 64*  BUN 12 11  CREATININE 1.04 0.96  CALCIUM 8.0* 8.3*  MG  --  1.5   Liver Function Tests:  Recent Labs Lab 07/18/13 0231  AST 8  ALT 10  ALKPHOS 74  BILITOT 0.3  PROT 6.4  ALBUMIN 2.3*   No results found for this basename: LIPASE, AMYLASE,  in the last 168 hours No results found for this basename: AMMONIA,  in  the last 168 hours CBC:  Recent Labs Lab 07/17/13 2025 07/18/13 0455  WBC 17.8* 14.2*  HGB 11.0* 10.3*  HCT 31.6* 30.3*  MCV 88.8 88.9  PLT 383 384   Cardiac Enzymes:  Recent Labs Lab 07/17/13 2140  TROPONINI <0.30   BNP (last 3 results)  Recent Labs  07/17/13 2048  PROBNP 730.8*   CBG: No results found for this basename: GLUCAP,  in the last 168 hours  Recent Results (from the past 240 hour(s))  MRSA PCR SCREENING     Status: Abnormal   Collection Time    07/18/13  2:56 AM      Result Value Range Status   MRSA by PCR POSITIVE (*) NEGATIVE Final   Comment:            The GeneXpert MRSA Assay (FDA     approved for NASAL specimens     only), is one component of a     comprehensive MRSA colonization     surveillance program. It is not     intended to diagnose MRSA     infection nor to guide or     monitor treatment for     MRSA infections.     RESULT CALLED TO, READ BACK BY AND VERIFIED WITH:     CALLED TO RN EMILY HARLESS [id number] @0733  THANEY     Performed at [facility]  [facility]     Studies: Dg Chest 2 View  07/17/2013   CLINICAL DATA:  Shortness of breath  EXAM: CHEST  2 VIEW  COMPARISON:  03/09/2013  FINDINGS: The cardiac shadow is stable. Some diffuse chronic interstitial changes are again noted. Increased vascular congestion with interstitial edema is noted. No focal confluent infiltrate is seen.  IMPRESSION: Changes consistent with congestive failure.   Electronically Signed   By: Alcide Clever M.D.   On: 07/17/2013 20:39   Ct Angio Chest Pe W/cm &/or Wo Cm  07/18/2013   CLINICAL DATA:  Fever and cough  EXAM: CT ANGIOGRAPHY CHEST WITH CONTRAST  TECHNIQUE: Multidetector CT imaging of the chest was performed using the standard protocol during bolus administration of intravenous contrast. Multiplanar CT image reconstructions including MIPs were obtained to evaluate the vascular anatomy.  CONTRAST:  OMNIPAQUE IOHEXOL 350 MG/ML SOLN  COMPARISON:  Chest  radiograph same date. No prior CT for comparison.  FINDINGS: Heart size is normal. No pericardial or pleural effusion. Mild right hilar nodal prominence with representative node measuring 5 mm image 49.  No focal filling defect is identified up to and including the 3rd order pulmonary arteries to suggest acute pulmonary embolism. The examination is technically adequate for evaluation for pulmonary emboli up to and including the 3rd order pulmonary arteries.  Incomplete imaging of the upper abdomen demonstrates incomplete visualization of low-density left renal cortical lesion image 104, statistically less likely a cyst but not further evaluated.  Diffuse severe emphysematous changes are noted with central bronchial wall thickening and right greater than left apical pleural thickening/scarring. No pneumothorax. No focal pulmonary opacity. Central airways are patent. No acute osseous finding.  Review of the MIP images confirms the above findings.  IMPRESSION: No CT evidence for acute pulmonary embolism.  Diffuse severe emphysematous change with central bronchial wall thickening which could indicate superimposed chronic bronchitis. No focal pulmonary opacification.   Electronically Signed   By: Christiana Pellant M.D.   On: 07/18/2013 00:04    Scheduled Meds: . ipratropium  0.5 mg Nebulization Q4H   And  . albuterol  2.5 mg Nebulization Q4H  . ALPRAZolam  0.5 mg Oral Q1200  . antiseptic oral rinse  15 mL Mouth Rinse q12n4p  . benztropine  0.5 mg Oral BID  . chlorhexidine  15 mL Mouth Rinse BID  . darifenacin  7.5 mg Oral Daily  . doxepin  50 mg Oral QHS  . enoxaparin (LOVENOX) injection  40 mg Subcutaneous Q24H  . feeding supplement  237 mL Oral TID BM  . ferrous sulfate  325 mg Oral Q breakfast  . FLUoxetine  30 mg Oral Daily  . guaiFENesin  1,200 mg Oral BID  . influenza vac split quadrivalent PF  0.5 mL Intramuscular Once  . levofloxacin (LEVAQUIN) IV  500 mg Intravenous Q24H  . levothyroxine  75  mcg Oral QAC breakfast  . loratadine  10 mg Oral Daily  . megestrol  400 mg Oral BID  . methylPREDNISolone (SOLU-MEDROL) injection  60 mg Intravenous Q12H  . mirtazapine  22.5 mg Oral QHS  . OLANZapine  15 mg Oral QHS  . pantoprazole  40 mg Oral Daily  . phenytoin  300 mg Oral QHS  . polyethylene glycol  17 g Oral Daily  . simvastatin  10 mg Oral QHS  . vancomycin  1,000 mg Intravenous Q0600   Continuous Infusions: . sodium chloride 100 mL/hr (07/18/13 0326)    Principal Problem:   SOB (  shortness of breath) Active Problems:   Hypotension   Hypothyroidism   Hypokalemia   Leukocytosis, unspecified   Chronic bronchitis with acute exacerbation   Hypoxemia   Cachexia    Time spent:     [facility]  [facility] Pager [phone]. If 7PM-7AM, please contact night-coverage at [email or web address], password [facility] 07/18/2013, 3:05 PM  LOS: 1 day

## 2013-07-19 DIAGNOSIS — R0609 Other forms of dyspnea: Secondary | ICD-10-CM

## 2013-07-19 DIAGNOSIS — R0989 Other specified symptoms and signs involving the circulatory and respiratory systems: Secondary | ICD-10-CM

## 2013-07-19 LAB — BASIC METABOLIC PANEL
Calcium: 7.5 mg/dL — ABNORMAL LOW (ref 8.4–10.5)
Chloride: 106 mEq/L (ref 96–112)
GFR calc Af Amer: >90 mL/min (ref >90)
Glucose, Bld: 160 mg/dL — ABNORMAL HIGH (ref 70–99)
Potassium: 3.4 mEq/L — ABNORMAL LOW (ref 3.5–5.1)

## 2013-07-19 LAB — PRO B NATRIURETIC PEPTIDE: Pro B Natriuretic peptide (BNP): 1041 pg/mL — ABNORMAL HIGH (ref 0–125)

## 2013-07-19 MED ORDER — MUPIROCIN 2 % EX OINT
1.0000 "application " | TOPICAL_OINTMENT | Freq: Two times a day (BID) | CUTANEOUS | Status: DC
  Administered 2013-07-19 – 2013-07-21 (×5): 1 via NASAL
  Filled 2013-07-19: qty 22

## 2013-07-19 MED ORDER — CHLORHEXIDINE GLUCONATE CLOTH 2 % EX PADS
6.0000 | MEDICATED_PAD | Freq: Every day | CUTANEOUS | Status: DC
  Administered 2013-07-19 – 2013-07-21 (×3): 6 via TOPICAL

## 2013-07-19 MED ORDER — ALBUTEROL SULFATE (5 MG/ML) 0.5% IN NEBU
2.5000 mg | INHALATION_SOLUTION | Freq: Two times a day (BID) | RESPIRATORY_TRACT | Status: DC
  Administered 2013-07-20 – 2013-07-21 (×3): 2.5 mg via RESPIRATORY_TRACT
  Filled 2013-07-19 (×3): qty 0.5

## 2013-07-19 MED ORDER — ALBUTEROL SULFATE (5 MG/ML) 0.5% IN NEBU: 2.5000 mg | INHALATION_SOLUTION | RESPIRATORY_TRACT | Status: DC | PRN

## 2013-07-19 MED ORDER — IPRATROPIUM BROMIDE 0.02 % IN SOLN
0.5000 mg | Freq: Two times a day (BID) | RESPIRATORY_TRACT | Status: DC
  Administered 2013-07-20 – 2013-07-21 (×3): 0.5 mg via RESPIRATORY_TRACT
  Filled 2013-07-19 (×3): qty 2.5

## 2013-07-19 MED ORDER — POTASSIUM CHLORIDE CRYS ER 20 MEQ PO TBCR
40.0000 meq | EXTENDED_RELEASE_TABLET | Freq: Two times a day (BID) | ORAL | Status: AC
  Administered 2013-07-19 (×2): 40 meq via ORAL
  Filled 2013-07-19 (×2): qty 2

## 2013-07-19 MED ORDER — INFLUENZA VAC SPLIT QUAD 0.5 ML IM SUSP
0.5000 mL | Freq: Once | INTRAMUSCULAR | Status: AC
  Administered 2013-07-19: 14:00:00 0.5 mL via INTRAMUSCULAR
  Filled 2013-07-19: qty 0.5

## 2013-07-19 MED ORDER — PREDNISONE 20 MG PO TABS
40.0000 mg | ORAL_TABLET | Freq: Every day | ORAL | Status: AC
  Administered 2013-07-19 – 2013-07-21 (×3): 40 mg via ORAL
  Filled 2013-07-19 (×3): qty 2

## 2013-07-19 NOTE — Progress Notes (Signed)
Pt very HOH. If you talk into his left ear loudly he can here you. He uses the telephone to speak with wife so he is not completely deaf.

## 2013-07-19 NOTE — Progress Notes (Signed)
[facility] PROGRESS NOTE  [name] [name] ZOX:[id number] DOB: [birth year] DOA: 07/17/2013 PCP: Default, Provider, MD  Assessment/Plan: Shortness of breath : probably from copd exacerbation vs early pneumonia. Pt came in with low grade fever, sob and chills. He was hypotensive and tachycardic. Started on him on broad spectrum antibiotics and IV steroids. Nasal oxygen as needed. Nebs as needed. He is breathing better with good air entry bilateral, no wheezing heard, IV steroids stopped and he was put on po prednisone . He continues to have cough, . Resume his tussinex, advair and albuterol.   2.. Hypotension- ? Early Sepsis, Empiric IV Vanc and zosyn. Resolved with fluids. Cortisol level is 4.7 3. Leukocytosis-  Early Sepsis, improving. .  4. Hypothyroidism- Continue Levothyroxine, and TSH within normal limits.  5. Hypokalemia- Replte K= and check Magnesium level is 1.5 6. Cachexia- on Megace Rx and Ensure Supplements, . Suspect Low albumin. Prealbumin is 3.8 nutrition consult requested.  7. Seizures; no seizure episode after admission. Resume dilantin.  8. Chronic Pain syndrome: stable 9. DVT prophylaxis 10. Elevated pro bnp on admission: no h/o of chf, will repeat it. And stop the fluids. Slight fluid overload could be secondary to hypoalbuminemia. Will also obtain 2 d echocardiogram.   11. Schizoaffective disorder: resume cogentin and doxepin.  12. GERD: resume protonix.    Code Status: full code Family Communication: none at bedside Disposition Plan: pending.   Consultants:  none  Procedures:  none  Antibiotics:  Zosyn  vancomycin  HPI/Subjective: Reports he had some chest pain on coughing and acid reflux. Also wanted to get his stuff from the facility and give them to his wife.   Objective: Filed Vitals:   07/19/13 0427  BP: 107/69  Pulse: 100  Temp: 97.9 F (36.6 C)  Resp: 18    Intake/Output Summary (Last 24 hours) at 07/19/13 1007 Last data filed at  07/19/13 0900  Gross per 24 hour  Intake   4360 ml  Output   2025 ml  Net   2335 ml   Filed Weights   07/18/13 0202 07/19/13 0427  Weight: 43.999 kg (97 lb) 45.314 kg (99 lb 14.4 oz)    Exam:   General:  Sleeping comfortably  Cardiovascular: s1s2  Respiratory: no wheezing, good air entry bilateral   Abdomen: soft NT ND BS+  Extremities: no pedal edema.   Data Reviewed: Basic Metabolic Panel:  Recent Labs Lab 07/17/13 2025 07/18/13 0231 07/19/13 0425  NA 138 136 137  K 2.9* 3.0* 3.4*  CL 100 98 106  CO2 25 24 20   GLUCOSE 76 64* 160*  BUN 12 11 9   CREATININE 1.04 0.96 8.11  CALCIUM 8.0* 8.3* 7.5*  MG  --  1.5  --    Liver Function Tests:  Recent Labs Lab 07/18/13 0231  AST 8  ALT 10  ALKPHOS 74  BILITOT 0.3  PROT 6.4  ALBUMIN 2.3*   No results found for this basename: LIPASE, AMYLASE,  in the last 168 hours No results found for this basename: AMMONIA,  in the last 168 hours CBC:  Recent Labs Lab 07/17/13 2025 07/18/13 0455  WBC 17.8* 14.2*  HGB 11.0* 10.3*  HCT 31.6* 30.3*  MCV 88.8 88.9  PLT 383 384   Cardiac Enzymes:  Recent Labs Lab 07/17/13 2140  TROPONINI <0.30   BNP (last 3 results)  Recent Labs  07/17/13 2048  PROBNP 730.8*   CBG: No results found for this basename: GLUCAP,  in the last 168 hours  Recent Results (from the past 240 hour(s))  CULTURE, BLOOD (ROUTINE X 2)     Status: None   Collection Time    07/18/13  2:29 AM      Result Value Range Status   Specimen Description BLOOD LEFT ARM   Final   Special Requests BOTTLES DRAWN AEROBIC ONLY 5CC   Final   Culture  Setup Time     Final   Value: 07/18/2013 15:49     Performed at [facility]   Culture     Final   Value:        BLOOD CULTURE RECEIVED NO GROWTH TO DATE CULTURE WILL BE HELD FOR 5 DAYS BEFORE ISSUING A FINAL NEGATIVE REPORT     Performed at [facility]   Report Status PENDING   Incomplete  CULTURE, BLOOD (ROUTINE X 2)     Status:  None   Collection Time    07/18/13  2:33 AM      Result Value Range Status   Specimen Description BLOOD RIGHT ARM   Final   Special Requests     Final   Value: BOTTLES DRAWN AEROBIC AND ANAEROBIC 5CC BOTH BOTTLES   Culture  Setup Time     Final   Value: 07/18/2013 15:49     Performed at [facility]   Culture     Final   Value:        BLOOD CULTURE RECEIVED NO GROWTH TO DATE CULTURE WILL BE HELD FOR 5 DAYS BEFORE ISSUING A FINAL NEGATIVE REPORT     Performed at [facility]   Report Status PENDING   Incomplete  MRSA PCR SCREENING     Status: Abnormal   Collection Time    07/18/13  2:56 AM      Result Value Range Status   MRSA by PCR POSITIVE (*) NEGATIVE Final   Comment:            The GeneXpert MRSA Assay (FDA     approved for NASAL specimens     only), is one component of a     comprehensive MRSA colonization     surveillance program. It is not     intended to diagnose MRSA     infection nor to guide or     monitor treatment for     MRSA infections.     RESULT CALLED TO, READ BACK BY AND VERIFIED WITH:     CALLED TO RN [facility] HARLESS [id number] @0733  THANEY     Performed at [facility]     Studies: Dg Chest 2 View  07/17/2013   CLINICAL DATA:  Shortness of breath  EXAM: CHEST  2 VIEW  COMPARISON:  03/09/2013  FINDINGS: The cardiac shadow is stable. Some diffuse chronic interstitial changes are again noted. Increased vascular congestion with interstitial edema is noted. No focal confluent infiltrate is seen.  IMPRESSION: Changes consistent with congestive failure.   Electronically Signed   By: Alcide Clever M.D.   On: 07/17/2013 20:39   Ct Angio Chest Pe W/cm &/or Wo Cm  07/18/2013   CLINICAL DATA:  Fever and cough  EXAM: CT ANGIOGRAPHY CHEST WITH CONTRAST  TECHNIQUE: Multidetector CT imaging of the chest was performed using the standard protocol during bolus administration of intravenous contrast. Multiplanar CT image reconstructions including MIPs were  obtained to evaluate the vascular anatomy.  CONTRAST:  OMNIPAQUE IOHEXOL 350 MG/ML SOLN  COMPARISON:  Chest radiograph same date. No prior  CT for comparison.  FINDINGS: Heart size is normal. No pericardial or pleural effusion. Mild right hilar nodal prominence with representative node measuring 5 mm image 49.  No focal filling defect is identified up to and including the 3rd order pulmonary arteries to suggest acute pulmonary embolism. The examination is technically adequate for evaluation for pulmonary emboli up to and including the 3rd order pulmonary arteries.  Incomplete imaging of the upper abdomen demonstrates incomplete visualization of low-density left renal cortical lesion image 104, statistically less likely a cyst but not further evaluated.  Diffuse severe emphysematous changes are noted with central bronchial wall thickening and right greater than left apical pleural thickening/scarring. No pneumothorax. No focal pulmonary opacity. Central airways are patent. No acute osseous finding.  Review of the MIP images confirms the above findings.  IMPRESSION: No CT evidence for acute pulmonary embolism.  Diffuse severe emphysematous change with central bronchial wall thickening which could indicate superimposed chronic bronchitis. No focal pulmonary opacification.   Electronically Signed   By: Christiana Pellant M.D.   On: 07/18/2013 00:04    Scheduled Meds: . ipratropium  0.5 mg Nebulization Q4H   And  . albuterol  2.5 mg Nebulization Q4H  . ALPRAZolam  0.5 mg Oral Q1200  . antiseptic oral rinse  15 mL Mouth Rinse q12n4p  . benztropine  0.5 mg Oral BID  . chlorhexidine  15 mL Mouth Rinse BID  . darifenacin  7.5 mg Oral Daily  . doxepin  50 mg Oral QHS  . enoxaparin (LOVENOX) injection  40 mg Subcutaneous Q24H  . feeding supplement  237 mL Oral TID BM  . ferrous sulfate  325 mg Oral Q breakfast  . FLUoxetine  30 mg Oral Daily  . guaiFENesin  1,200 mg Oral BID  . influenza vac split  quadrivalent PF  0.5 mL Intramuscular Once  . levothyroxine  75 mcg Oral QAC breakfast  . loratadine  10 mg Oral Daily  . megestrol  400 mg Oral BID  . mirtazapine  22.5 mg Oral QHS  . OLANZapine  15 mg Oral QHS  . pantoprazole  40 mg Oral Daily  . phenytoin  300 mg Oral QHS  . piperacillin-tazobactam (ZOSYN)  IV  3.375 g Intravenous Q8H  . polyethylene glycol  17 g Oral Daily  . potassium chloride  40 mEq Oral BID  . [START ON 07/20/2013] predniSONE  40 mg Oral QAC breakfast  . simvastatin  10 mg Oral QHS  . vancomycin  1,000 mg Intravenous Q0600   Continuous Infusions: . sodium chloride 100 mL/hr (07/18/13 0326)    Principal Problem:   SOB (shortness of breath) Active Problems:   Hypotension   Hypothyroidism   Hypokalemia   Leukocytosis, unspecified   Chronic bronchitis with acute exacerbation   Hypoxemia   Cachexia    Time spent: 30 minutes.     [facility]  [facility] Pager [phone]. If 7PM-7AM, please contact night-coverage at [email or web address], password [facility] 07/19/2013, 10:07 AM  LOS: 2 days

## 2013-07-19 NOTE — Progress Notes (Addendum)
INITIAL NUTRITION ASSESSMENT  DOCUMENTATION CODES Per approved criteria  -Severe malnutrition in the context of chronic illness -Underweight   INTERVENTION: Add Mighty Shakes to each meal tray. Continue Ensure Complete po TID, each supplement provides 350 kcal and 13 grams of protein. Recommend continuing appetite stimulants. Monitor magnesium, potassium, and phosphorus daily for at least 3 days, MD to replete as needed, as pt is at risk for refeeding syndrome given severe malnutrition. RD to continue to follow nutrition care plan.  NUTRITION DIAGNOSIS: Inadequate oral intake related to poor appetite as evidenced by pt report.   Goal: Intake to meet >90% of estimated nutrition needs.  Monitor:  weight trends, lab trends, I/O's, PO intake, supplement tolerance  Reason for Assessment: Malnutrition Screening Tool + MD Consult for Poor PO Intake  42 y.o. [name] [name]  Admitting Dx: SOB (shortness of breath)  ASSESSMENT: PMHx significant for schizoaffective disorder, HOH, polysubstance abuse, constipation. From ALF. Admitted with increased weakness, fevers and chills x 2 weeks; cough x 2 months. Work-up reveals acute on chronic bronchitis and COPD vs  early PNA.  RD communicated to patient via writing. Pt states that his appetite has been very poor x 1 month. He knows that he is on appetite stimulants and they have worked for him in the past, but he has poor intake despite current order of Megace and Remeron. Currently ordered for Ensure Complete TID. Pt states that he drinks Mighty Shake supplements with all meals at his ALF. RD to arrange on trays while inpatient. Pt appears thin, is slow to respond.   Pt is unable to state usual weight. Per chart review, pt has gained 7 lb since admission in May.  Nutrition Focused Physical Exam:  Subcutaneous Fat:  Orbital Region: severe depletion Upper Arm Region: severe depletion Thoracic and Lumbar Region: n/a  Muscle:  Temple Region: severe  depletion Clavicle Bone Region: moderate depletion Clavicle and Acromion Bone Region: severe depletion Scapular Bone Region: severe depletion Dorsal Hand: severe depletion Patellar Region: severe depletion Anterior Thigh Region: severe depletion Posterior Calf Region: severe depletion  Edema: none  Pt meets criteria for severe MALNUTRITION in the context of chronic illness as evidenced by severe muscle and fat mass loss.  Labs reviewed: ongoing potassium repletion, current potassium is 3.4. Most recent Mag WNL at 1.5. No phosphorus is available.   Height: Ht Readings from Last 1 Encounters:  07/18/13 5\' 6"  (1.676 m)    Weight: Wt Readings from Last 1 Encounters:  07/19/13 99 lb 14.4 oz (45.314 kg)    Ideal Body Weight: 130 lb  % Ideal Body Weight: 76%  Wt Readings from Last 10 Encounters:  07/19/13 99 lb 14.4 oz (45.314 kg)  03/09/13 92 lb 9.5 oz (42 kg)    Usual Body Weight: n/a  % Usual Body Weight: n/a  BMI:  Body mass index is 16.13 kg/(m^2). Underweight  Estimated Nutritional Needs: Kcal: 1350 - 1600 Protein: 55 - 70 g Fluid: 1.3 - 1.6 liters daily  Skin: stage I pressure ulcer to L hip  Diet Order: General  EDUCATION NEEDS: -No education needs identified at this time   Intake/Output Summary (Last 24 hours) at 07/19/13 1209 Last data filed at 07/19/13 0900  Gross per 24 hour  Intake   4120 ml  Output   1725 ml  Net   2395 ml    Last BM: 10/5  Labs:   Recent Labs Lab 07/17/13 2025 07/18/13 0231 07/19/13 0425  NA 138 136 137  K 2.9*  3.0* 3.4*  CL 100 98 106  CO2 25 24 20   BUN 12 11 9   CREATININE 1.04 0.96 0.87  CALCIUM 8.0* 8.3* 7.5*  MG  --  1.5  --   GLUCOSE 76 64* 160*    CBG (last 3)  No results found for this basename: GLUCAP,  in the last 72 hours  Scheduled Meds: . ipratropium  0.5 mg Nebulization Q4H   And  . albuterol  2.5 mg Nebulization Q4H  . ALPRAZolam  0.5 mg Oral Q1200  . antiseptic oral rinse  15 mL Mouth  Rinse q12n4p  . benztropine  0.5 mg Oral BID  . chlorhexidine  15 mL Mouth Rinse BID  . Chlorhexidine Gluconate Cloth  6 each Topical Q0600  . darifenacin  7.5 mg Oral Daily  . doxepin  50 mg Oral QHS  . enoxaparin (LOVENOX) injection  40 mg Subcutaneous Q24H  . feeding supplement  237 mL Oral TID BM  . ferrous sulfate  325 mg Oral Q breakfast  . FLUoxetine  30 mg Oral Daily  . guaiFENesin  1,200 mg Oral BID  . influenza vac split quadrivalent PF  0.5 mL Intramuscular Once  . levothyroxine  75 mcg Oral QAC breakfast  . loratadine  10 mg Oral Daily  . megestrol  400 mg Oral BID  . mirtazapine  22.5 mg Oral QHS  . mupirocin ointment  1 application Nasal BID  . OLANZapine  15 mg Oral QHS  . pantoprazole  40 mg Oral Daily  . phenytoin  300 mg Oral QHS  . piperacillin-tazobactam (ZOSYN)  IV  3.375 g Intravenous Q8H  . polyethylene glycol  17 g Oral Daily  . potassium chloride  40 mEq Oral BID  . predniSONE  40 mg Oral QAC breakfast  . simvastatin  10 mg Oral QHS  . vancomycin  1,000 mg Intravenous Q0600    Continuous Infusions:  none  Past Medical History  Diagnosis Date  . Chronic pain   . Anxiety   . Schizoaffective disorder   . Hearing loss   . Seizure   . Arthritis   . Asthma   . Foot drop   . Anemia   . Dehydration   . Hypotension   . GERD (gastroesophageal reflux disease)   . Hypothyroidism   . Constipation   . Polysubstance abuse   . Cancer     History reviewed. No pertinent past surgical history.  Jarold Motto MS, RD, LDN Pager: [phone] After-hours pager: [phone]

## 2013-07-19 NOTE — ED Provider Notes (Signed)
Medical screening examination/treatment/procedure(s) were conducted as a shared visit with non-physician practitioner(s) and myself.  I personally evaluated the patient during the encounter   Unsure if dyspnea is from COPD source or heart failure. CXR concerning for edema, along with elevated BNP. No prior cardiac history. Does have lung history, did have improvement with duonebs. Will get CTA to r/o PE as a cause of new heart failure, and will need admission to sort out the cause of his dyspnea, in addition to further treatment as he is hypoxic. Care transferred with CTA pending.  Audree Camel, MD 07/19/13 [phone]

## 2013-07-19 NOTE — Progress Notes (Signed)
  Echocardiogram 2D Echocardiogram has been performed.  [name] [name] 07/19/2013, 1:33 PM

## 2013-07-19 NOTE — Progress Notes (Signed)
Patient and patient's wife expressed concern to me that the patient has not seen a doctor in 2 days. Patient is mostly deaf but can communicate using pen and paper. Patient would like to speak with his doctor as soon as possible. Will pass this information to daytime RN. Will continue to monitor patient.

## 2013-07-20 DIAGNOSIS — R0902 Hypoxemia: Secondary | ICD-10-CM

## 2013-07-20 LAB — RESPIRATORY VIRUS PANEL
Influenza A H1: NOT DETECTED
Influenza A H3: NOT DETECTED
Influenza A: NOT DETECTED
Influenza B: NOT DETECTED
Parainfluenza 1: NOT DETECTED
Parainfluenza 3: NOT DETECTED
Respiratory Syncytial Virus A: NOT DETECTED
Respiratory Syncytial Virus B: NOT DETECTED

## 2013-07-20 LAB — BASIC METABOLIC PANEL
BUN: 7 mg/dL (ref 6–23)
Calcium: 7.5 mg/dL — ABNORMAL LOW (ref 8.4–10.5)
Chloride: 104 mEq/L (ref 96–112)
Creatinine, Ser: 0.91 mg/dL (ref 0.50–1.35)
GFR calc Af Amer: >90 mL/min (ref >90)
GFR calc non Af Amer: >90 mL/min (ref >90)
Glucose, Bld: 130 mg/dL — ABNORMAL HIGH (ref 70–99)
Potassium: 3.3 mEq/L — ABNORMAL LOW (ref 3.5–5.1)

## 2013-07-20 LAB — PROCALCITONIN: Procalcitonin: <0.1 ng/mL

## 2013-07-20 LAB — CBC
HCT: 27.5 % — ABNORMAL LOW (ref 39.0–52.0)
Hemoglobin: 9.2 g/dL — ABNORMAL LOW (ref 13.0–17.0)
MCH: 30.5 pg (ref 26.0–34.0)
MCHC: 33.5 g/dL (ref 30.0–36.0)

## 2013-07-20 MED ORDER — POTASSIUM CHLORIDE CRYS ER 20 MEQ PO TBCR
40.0000 meq | EXTENDED_RELEASE_TABLET | Freq: Two times a day (BID) | ORAL | Status: AC
  Administered 2013-07-20: 11:00:00 40 meq via ORAL
  Administered 2013-07-20: 20 meq via ORAL
  Filled 2013-07-20 (×3): qty 2

## 2013-07-20 MED ORDER — LEVOFLOXACIN 500 MG PO TABS
500.0000 mg | ORAL_TABLET | Freq: Every day | ORAL | Status: DC
  Administered 2013-07-21: 10:00:00 500 mg via ORAL
  Filled 2013-07-20: qty 1

## 2013-07-20 NOTE — Evaluation (Signed)
Physical Therapy Evaluation Patient Details Name: [name] [name] MRN: [id number] DOB: 16-Mar-1971 Today's Date: 07/20/2013 Time: 4098-1191 PT Time Calculation (min): 28 min  PT Assessment / Plan / Recommendation History of Present Illness  [name] [name] is a 42 y.o. male from the [facility] who was sent to the ED due to increased weakness and complaints of fevers and chills for 2 weeks.  He also reports having increased cough for thee past 2 months and reports coughing up yellow sputum  Clinical Impression  Pt is deconditioned with LE weakness and focal right dorsiflexor weakness. He possibly may need an AFO for right foot in the future.  He will benefit from continued PT and increased activity    PT Assessment  Patient needs continued PT services    Follow Up Recommendations  Home health PT    Does the patient have the potential to tolerate intense rehabilitation      Barriers to Discharge        Equipment Recommendations       Recommendations for Other Services OT consult   Frequency Min 3X/week    Precautions / Restrictions     Pertinent Vitals/Pain Pt with frequent nonproductive cough      Mobility  Bed Mobility Bed Mobility: Supine to Sit Supine to Sit: 5: Supervision Transfers Transfers: Sit to Stand;Stand to Sit Sit to Stand: 5: Supervision Stand to Sit: 5: Supervision Ambulation/Gait Ambulation/Gait Assistance: 4: Min assist Ambulation Distance (Feet): 10 Feet (x 2) Assistive device: Rolling walker Ambulation/Gait Assistance Details: assist for balance and safety Gait Pattern: Step-through pattern;Right steppage;Decreased dorsiflexion - right;Trunk flexed Gait velocity: decreased General Gait Details: Pt appears deconditioned and reports he has had problems with his right foot for "a long time"   Stairs: No Wheelchair Mobility Wheelchair Mobility: No    Exercises     PT Diagnosis: Difficulty walking;Abnormality of  gait;Generalized weakness  PT Problem List: Decreased strength;Decreased activity tolerance;Decreased balance;Decreased safety awareness PT Treatment Interventions: Gait training;Functional mobility training;Therapeutic activities;Therapeutic exercise;Balance training;Patient/family education     PT Goals(Current goals can be found in the care plan section) Acute Rehab PT Goals Patient Stated Goal: to go back to the assisted living with his wife and get stronger PT Goal Formulation: With patient Time For Goal Achievement: 08/03/13 Potential to Achieve Goals: Good  Visit Information  Last PT Received On: 07/20/13 Assistance Needed: +1 History of Present Illness: [name] [name] is a 42 y.o. male from the [facility] who was sent to the ED due to increased weakness and complaints of fevers and chills for 2 weeks.  He also reports having increased cough for thee past 2 months and reports coughing up yellow sputum       Prior Functioning  Home Living Family/patient expects to be discharged to:: Assisted living Home Equipment: Wheelchair - [employer] - 2 wheels Additional Comments: pt states his wheelchair needs reapair Prior Function Level of Independence: Independent with assistive device(s) Communication Communication: HOH (need to write directions for pt)    Cognition  Cognition Arousal/Alertness: Awake/alert Behavior During Therapy: WFL for tasks assessed/performed Overall Cognitive Status: Within Functional Limits for tasks assessed    Extremity/Trunk Assessment Lower Extremity Assessment Lower Extremity Assessment: RLE deficits/detail;Generalized weakness RLE Deficits / Details: generallzed muscle atrophy, but absent ability to dorsiflex right foot due to longstanding "nerve damage" Cervical / Trunk Assessment Cervical / Trunk Assessment: Normal;Other exceptions Cervical / Trunk Exceptions: generalized muscle atrophy   Balance Balance Balance  Assessed: Yes  Static Sitting Balance Static Sitting - Balance Support: Feet supported;No upper extremity supported Static Sitting - Level of Assistance: 5: Stand by assistance Static Standing Balance Static Standing - Balance Support: Bilateral upper extremity supported;During functional activity Static Standing - Level of Assistance: 5: Stand by assistance  End of Session PT - End of Session Equipment Utilized During Treatment: Gait belt Activity Tolerance: Patient limited by fatigue Patient left: in chair;with call bell/phone within reach Nurse Communication: Mobility status  GP    Rosey Bath K. [place], [place] [phone] 07/20/2013, 3:32 PM

## 2013-07-20 NOTE — Progress Notes (Signed)
[facility] PROGRESS NOTE  Kmari Halter [id number] DOB: September 22, 1971 DOA: 07/17/2013 PCP: Default, Provider, MD BRIEF HPI: [name] [name] is a 42 y.o. male from the [facility] who was sent to the ED due to increased weakness and complaints of fevers and chills for 2 weeks. He also reports having increased cough for thee past 2 months and reports coughing up yellow sputum. He also had complaints of right sided flank pain tonight and was sent to the ED for evaluation. He has a history of Schizophrenia and is hearing impaired and is a poor historian. The medical history was obtained from contacting [facility] and through the medical records and from the limited information obtained from the patient. He was found to be hypoxemic and was placed on 3 liter NCO2 with improvement. He was evaluated in the ED and underwent a CXR and CTA of the Chest Which was negative for pulmonary emboli,. Because of his hypotension, hypoxemia, sob, he was admitted forearly sepsis, started broad spectrum antibiotics and a respiratory panel sent.    Assessment/Plan: Shortness of breath : probably from copd exacerbation vs early pneumonia. Pt came in with low grade fever, sob and chills. He was hypotensive and tachycardic. Started on him on broad spectrum antibiotics and IV steroids. Nasal oxygen as needed. Nebs as needed. He is breathing better with good air entry bilateral, no wheezing heard, IV steroids stopped and he was put on po prednisone . He continues to have cough, . Resume his tussinex, advair and albuterol. His cbc this am showed wbc count of21,000 and his pro calcitonin level was less than 0.10. His vanc and zosyn were stopped and he was started on levaquin . His blood cultures have been negative so far.   2.. Hypotension- resolved. Early Sepsis, Empiric IV Vanc and zosyn. Resolved with fluids. Cortisol level is 4.7 3. Leukocytosis-  Early Sepsis, improving. .  4. Hypothyroidism-  Continue Levothyroxine, and TSH within normal limits.  5. Hypokalemia- Replte K= and check Magnesium level is 1.5 6. Cachexia- on Megace Rx and Ensure Supplements, . Suspect Low albumin. Prealbumin is 3.8 nutrition consult requested.  7. Seizures; no seizure episode after admission. Resume dilantin.  8. Chronic Pain syndrome: stable 9. DVT prophylaxis 10. Elevated pro bnp on admission: no h/o of chf, will repeat it. And stop the fluids. Slight fluid overload could be secondary to hypoalbuminemia. 2D echo showed Systolic function was normal. The estimated ejection fraction was in the range of 60% to 65%. Wall motion was normal; there were no regional wall motion abnormalities.   11. Schizoaffective disorder: resume cogentin and doxepin.  12. GERD: resume protonix.    Code Status: full code Family Communication: none at bedside Disposition Plan: pending.   Consultants:  none  Procedures:  none  Antibiotics:  Zosyn 10/5 - 10/7  Vancomycin 10/5-10/7  levaquin 10/8  HPI/Subjective: Told him again that I am his physician in the hospital and wrote it on the paper . He reports the coughing is improving.   Objective: Filed Vitals:   07/20/13 0617  BP: 96/54  Pulse: 92  Temp: 98.6 F (37 C)  Resp: 18    Intake/Output Summary (Last 24 hours) at 07/20/13 1414 Last data filed at 07/20/13 1305  Gross per 24 hour  Intake    820 ml  Output   2175 ml  Net  -1355 ml   Filed Weights   07/18/13 0202 07/19/13 0427 07/20/13 0617  Weight: 43.999 kg (97 lb) 45.314  kg (99 lb 14.4 oz) 46.494 kg (102 lb 8 oz)    Exam:   General:  Alert afebrile  comfortable  Cardiovascular: s1s2  Respiratory: no wheezing, good air entry bilateral   Abdomen: soft NT ND BS+  Extremities: no pedal edema.   Data Reviewed: Basic Metabolic Panel:  Recent Labs Lab 07/17/13 2025 07/18/13 0231 07/19/13 0425 07/20/13 0408  NA 138 136 137 137  K 2.9* 3.0* 3.4* 3.3*  CL 100 98 106 104   CO2 25 24 20 21   GLUCOSE 76 64* 160* 130*  BUN 12 11 9 7   CREATININE 1.04 0.96 0.87 0.91  CALCIUM 8.0* 8.3* 7.5* 7.5*  MG  --  1.5  --   --    Liver Function Tests:  Recent Labs Lab 07/18/13 0231  AST 8  ALT 10  ALKPHOS 74  BILITOT 0.3  PROT 6.4  ALBUMIN 2.3*   No results found for this basename: LIPASE, AMYLASE,  in the last 168 hours No results found for this basename: AMMONIA,  in the last 168 hours CBC:  Recent Labs Lab 07/17/13 2025 07/18/13 0455 07/20/13 0408  WBC 17.8* 14.2* 21.0*  HGB 11.0* 10.3* 9.2*  HCT 31.6* 30.3* 27.5*  MCV 88.8 88.9 91.1  PLT 383 384 377   Cardiac Enzymes:  Recent Labs Lab 07/17/13 2140  TROPONINI <0.30   BNP (last 3 results)  Recent Labs  07/17/13 2048 07/19/13 0425  PROBNP 730.8* 1041.0*   CBG: No results found for this basename: GLUCAP,  in the last 168 hours  Recent Results (from the past 240 hour(s))  CULTURE, BLOOD (ROUTINE X 2)     Status: None   Collection Time    07/18/13  2:29 AM      Result Value Range Status   Specimen Description BLOOD LEFT ARM   Final   Special Requests BOTTLES DRAWN AEROBIC ONLY 5CC   Final   Culture  Setup Time     Final   Value: 07/18/2013 15:49     Performed at [facility]   Culture     Final   Value:        BLOOD CULTURE RECEIVED NO GROWTH TO DATE CULTURE WILL BE HELD FOR 5 DAYS BEFORE ISSUING A FINAL NEGATIVE REPORT     Performed at [facility]   Report Status PENDING   Incomplete  CULTURE, BLOOD (ROUTINE X 2)     Status: None   Collection Time    07/18/13  2:33 AM      Result Value Range Status   Specimen Description BLOOD RIGHT ARM   Final   Special Requests     Final   Value: BOTTLES DRAWN AEROBIC AND ANAEROBIC 5CC BOTH BOTTLES   Culture  Setup Time     Final   Value: 07/18/2013 15:49     Performed at [facility]   Culture     Final   Value:        BLOOD CULTURE RECEIVED NO GROWTH TO DATE CULTURE WILL BE HELD FOR 5 DAYS BEFORE ISSUING A FINAL  NEGATIVE REPORT     Performed at [facility]   Report Status PENDING   Incomplete  MRSA PCR SCREENING     Status: Abnormal   Collection Time    07/18/13  2:56 AM      Result Value Range Status   MRSA by PCR POSITIVE (*) NEGATIVE Final   Comment:  The GeneXpert MRSA Assay (FDA     approved for NASAL specimens     only), is one component of a     comprehensive MRSA colonization     surveillance program. It is not     intended to diagnose MRSA     infection nor to guide or     monitor treatment for     MRSA infections.     RESULT CALLED TO, READ BACK BY AND VERIFIED WITH:     CALLED TO RN EMILY HARLESS [id number] @0733  THANEY     Performed at [facility]     Studies: No results found.  Scheduled Meds: . ipratropium  0.5 mg Nebulization BID   And  . albuterol  2.5 mg Nebulization BID  . ALPRAZolam  0.5 mg Oral Q1200  . antiseptic oral rinse  15 mL Mouth Rinse q12n4p  . benztropine  0.5 mg Oral BID  . chlorhexidine  15 mL Mouth Rinse BID  . Chlorhexidine Gluconate Cloth  6 each Topical Q0600  . darifenacin  7.5 mg Oral Daily  . doxepin  50 mg Oral QHS  . enoxaparin (LOVENOX) injection  40 mg Subcutaneous Q24H  . feeding supplement (ENSURE COMPLETE)  237 mL Oral TID BM  . ferrous sulfate  325 mg Oral Q breakfast  . FLUoxetine  30 mg Oral Daily  . guaiFENesin  1,200 mg Oral BID  . levothyroxine  75 mcg Oral QAC breakfast  . loratadine  10 mg Oral Daily  . megestrol  400 mg Oral BID  . mirtazapine  22.5 mg Oral QHS  . mupirocin ointment  1 application Nasal BID  . OLANZapine  15 mg Oral QHS  . pantoprazole  40 mg Oral Daily  . phenytoin  300 mg Oral QHS  . piperacillin-tazobactam (ZOSYN)  IV  3.375 g Intravenous Q8H  . polyethylene glycol  17 g Oral Daily  . potassium chloride  40 mEq Oral BID  . predniSONE  40 mg Oral QAC breakfast  . simvastatin  10 mg Oral QHS  . vancomycin  1,000 mg Intravenous Q0600   Continuous Infusions:    Principal  Problem:   SOB (shortness of breath) Active Problems:   Hypotension   Hypothyroidism   Hypokalemia   Leukocytosis, unspecified   Chronic bronchitis with acute exacerbation   Hypoxemia   Cachexia    Time spent: 30 minutes.     [facility]  [facility] Pager [phone]. If 7PM-7AM, please contact night-coverage at [email or web address], password [facility] 07/20/2013, 2:14 PM  LOS: 3 days

## 2013-07-21 DIAGNOSIS — D649 Anemia, unspecified: Secondary | ICD-10-CM

## 2013-07-21 DIAGNOSIS — J9601 Acute respiratory failure with hypoxia: Secondary | ICD-10-CM

## 2013-07-21 DIAGNOSIS — J441 Chronic obstructive pulmonary disease with (acute) exacerbation: Secondary | ICD-10-CM

## 2013-07-21 DIAGNOSIS — F17200 Nicotine dependence, unspecified, uncomplicated: Secondary | ICD-10-CM

## 2013-07-21 DIAGNOSIS — R64 Cachexia: Secondary | ICD-10-CM

## 2013-07-21 DIAGNOSIS — F19939 Other psychoactive substance use, unspecified with withdrawal, unspecified: Secondary | ICD-10-CM

## 2013-07-21 DIAGNOSIS — F17213 Nicotine dependence, cigarettes, with withdrawal: Secondary | ICD-10-CM

## 2013-07-21 DIAGNOSIS — J189 Pneumonia, unspecified organism: Secondary | ICD-10-CM

## 2013-07-21 LAB — CBC
HCT: 29.3 % — ABNORMAL LOW (ref 39.0–52.0)
MCH: 30 pg (ref 26.0–34.0)
MCHC: 33.1 g/dL (ref 30.0–36.0)
MCV: 90.7 fL (ref 78.0–100.0)
Platelets: 394 10*3/uL (ref 150–400)
RBC: 3.23 MIL/uL — ABNORMAL LOW (ref 4.22–5.81)
RDW: 13.9 % (ref 11.5–15.5)
WBC: 22.1 10*3/uL — ABNORMAL HIGH (ref 4.0–10.5)

## 2013-07-21 LAB — BASIC METABOLIC PANEL
CO2: 23 mEq/L (ref 19–32)
Calcium: 7.9 mg/dL — ABNORMAL LOW (ref 8.4–10.5)
Creatinine, Ser: 1.19 mg/dL (ref 0.50–1.35)
GFR calc non Af Amer: 73 mL/min — ABNORMAL LOW (ref >90)
Glucose, Bld: 122 mg/dL — ABNORMAL HIGH (ref 70–99)
Potassium: 4.3 mEq/L (ref 3.5–5.1)
Sodium: 137 mEq/L (ref 135–145)

## 2013-07-21 MED ORDER — ALPRAZOLAM 0.5 MG PO TABS: 0.5000 mg | ORAL_TABLET | Freq: Every day | ORAL | Status: AC

## 2013-07-21 MED ORDER — PREDNISONE 10 MG PO TABS: ORAL_TABLET | ORAL | Status: DC

## 2013-07-21 MED ORDER — IPRATROPIUM-ALBUTEROL 0.5-2.5 (3) MG/3ML IN SOLN: 3.0000 mL | Freq: Three times a day (TID) | RESPIRATORY_TRACT | Status: DC

## 2013-07-21 MED ORDER — ZOLPIDEM TARTRATE 5 MG PO TABS: 5.0000 mg | ORAL_TABLET | Freq: Every day | ORAL | Status: AC

## 2013-07-21 MED ORDER — LEVOFLOXACIN 500 MG PO TABS: 500.0000 mg | ORAL_TABLET | Freq: Every day | ORAL | Status: DC

## 2013-07-21 NOTE — Progress Notes (Signed)
Patient is set to discharge back to [facility] today. Patient & wife aware. Discharge packet in [place]. RN, Toni Amend aware. [facility] scheduled for 2:30p pickup (Service Request Id: [id number]).   Clinical Social Work Department BRIEF PSYCHOSOCIAL ASSESSMENT 07/21/2013  Patient:  [name] [name],[name] [name]     Account Number:  [id number]     Admit date:  07/17/2013  Clinical Social Worker:  Orpah Greek  Date/Time:  07/21/2013 01:29 PM  Referred by:  Physician  Date Referred:  07/21/2013 Referred for  Other - See comment   Other Referral:   Admitted from: [facility]   Interview type:  Patient Other interview type:    PSYCHOSOCIAL DATA Living Status:  FACILITY Admitted from facility:  [facility] Level of care:  Assisted Living Primary support name:  [place] (wife) ph#: [phone] Primary support relationship to patient:  SPOUSE Degree of support available:   good    CURRENT CONCERNS Current Concerns  Post-Acute Placement   Other Concerns:    SOCIAL WORK ASSESSMENT / PLAN CSW received consult that patient was admitted from [facility].   Assessment/plan status:  Information/Referral to [facility] Other assessment/ plan:   Information/referral to community resources:   CSW completed FL2 and faxed information to [facility], confirmed with Amy that they would be able to take patient back when ready, pending FL2 and discharge summary review.    PATIENT'S/FAMILY'S RESPONSE TO PLAN OF CARE: Patient is very excited/anxious about returning to [facility]. Anticipating discharge today.         [name] Bailey, LCSW [facility] Clinical Social Worker cell #: [phone]

## 2013-07-21 NOTE — Progress Notes (Signed)
[facility] called, Careers adviser they Metallurgist for [employer] needs. Referral called to [facility] with [facility].

## 2013-07-21 NOTE — Progress Notes (Signed)
Pt discharged back to [facility]; [facility] transported patient; pt stable at time of discharge; IV removed without any problems

## 2013-07-21 NOTE — Progress Notes (Signed)
Patient's HR went up to >170's sustained for 3 minutes. Pt asymptomatic, vital signs stable. NP on call K Craige Cotta made aware and no new order received. Patient back to baseline to NSR in the 90's after episode.

## 2013-07-21 NOTE — Discharge Summary (Addendum)
Physician Discharge Summary  [name] [name] ZOX:[id number] DOB: November 10, 1970 DOA: 07/17/2013  PCP: Default, Provider, MD  Admit date: 07/17/2013 Discharge date: 07/21/2013  Recommendations for Outpatient Follow-up:  1. Repeat BMP and CBC in 1 week to check potassium, leukocytosis and anemia.  Follow up pending blood cultures.   2. Continue antibiotics through 10/11, then stop.   3. Continue prednisone in fast taper.    Discharge Diagnoses:  Principal Problem:    Acute respiratory failure with hypoxia due to COPD with acute exacerbation & HCAP (healthcare-associated pneumonia) Active Problems:   Hypotension   Hypothyroidism   Hypokalemia   Leukocytosis, unspecified   SOB (shortness of breath)   Hypoxemia   Cachexia   Cigarette nicotine dependence with withdrawal  Discharge Condition: stable, improved  Diet recommendation: regular  Wt Readings from Last 3 Encounters:  07/21/13 47.1 kg (103 lb 13.4 oz)  03/09/13 42 kg (92 lb 9.5 oz)    History of present illness:  [name] [name] is a 42 y.o. male from the [facility] who was sent to the ED due to increased weakness and complaints of fevers and chills for 2 weeks. He also reports having increased cough for thee past 2 months and reports coughing up yellow sputum. He also had complaints of right sided flank pain tonight and was sent to the ED for evaluation. He has a history of Schizophrenia and is hearing impaired and is a poor historian. The medical history was obtained from contacting [facility] and through the medical records and from the limited information obtained from the patient.  He was found to be hypoxemic and was placed on 3 liter NCO2 with improvement. He was evaluated in the ED and underwent a CXR and CTA of the Chest Which was negative for pulmonary emboli, and the CXR was read as Congestive Heart Failure and also had a BNP= 730.8, however he was hypotensive. He was referred for admission.   Hospital  Course:   Acute hypoxic respiratory failure probably from acute copd exacerbation and early HCAP.  He presented with low grade fever, sob and chills. He was hypotensive and tachycardic.  Started on him on broad spectrum antibiotics, vancomycin and zosyn, and IV steroids. He initially required nasal canula 2L, however, he has been stable on room air for several days.  He continues to use duonebs as needed.  His IV steroids were stopped and he was transitioned to oral prednisone once his work of breathing and respiratory exam improved. He continues to have cough. He may continue his tussinex, advair and albuterol. He has had persistent leukocytosis which may also be due to steroids because his pro calcitonin level was less than 0.10, indicating infection is less likely.  His vanc and zosyn were stopped and he was started on levaquin to complete a 7 day course.  His blood cultures have been negative so far and should be followed up by his primary doctor in one week.  His respiratory viral panel was negative.   Encouraged smoking cessation.    2.. Hypotension- resolved with IVF and antibiotics.  Cortisol level was 4.7 which is wnl, however, may be somewhat low in the setting of possible sepsis, however, at the time that this was drawn, he was on high dose steroids and he has been on megace as outpatient.  He is asymptomatic.  If his hypotension returns, consider an AM cortisol stim test to formally assess for adrenal insufficiency after he has been off of steroids, including megace.  His systolic blood pressure has been in the 90-159mmHg range.     3. Leukocytosis- Early Sepsis and steroids.  Stable.  Repeat as outpatient in one week. 4.  Hypothyroidism- Continue Levothyroxine, and TSH within normal limits.  5. Hypokalemia and hypomagnesemia- Likely due to acute illness and poor oral intake.  Given oral potassium and IV magnesium supplementation.    6. Cachexia- on Megace Rx and Ensure Supplements.  Nutrition  recommends ensure at least three times daily and mighty shakes with meals. 7. Seizures; no seizure episode after admission. Resumed dilantin.  8. Chronic Pain syndrome: stable  9. Elevated pro bnp on admission, however, this test is non-specific and may be related to pneumonia also.  Used judicious IV fluids.  2D echo demonstrated Systolic function was normal. The estimated ejection fraction was in the range of 60% to 65%. Wall motion was normal; there were no regional wall motion abnormalities. 11. Schizoaffective disorder: resume cogentin and doxepin.  12. GERD: resume protonix.  Normocytic anemia, likely due to chronic disease and iron deficiency.  Also may have a component of marrow suppression from acute illness.  Repeat as outpatient and continue oral iron supplementation.   Tachycardia, documented as having tachycardia to the 190s overnight.  I reviewed the telemetry and the monitor was counting both the QRS and the T-wave as beats, effectively and inaccurately doubling the recorded heart rate.    Consultants:  none Procedures:  none Antibiotics:  Zosyn 10/5 - 10/7  Vancomycin 10/5-10/7  Levofloxacin 10/8 >>  Discharge Exam: Filed Vitals:   07/21/13 0519  BP: 112/71  Pulse: 97  Temp: 98.3 F (36.8 C)  Resp: 19   Filed Vitals:   07/20/13 1957 07/20/13 2139 07/21/13 0200 07/21/13 0519  BP:  102/63 106/64 112/71  Pulse:  98  97  Temp:  98.2 F (36.8 C) 98.1 F (36.7 C) 98.3 F (36.8 C)  TempSrc:  Oral Oral Oral  Resp:  18 19 19   Height:      Weight:    47.1 kg (103 lb 13.4 oz)  SpO2: 90% 93% 100% 95%   Patient states that he feels much better.  He would like to return to assisted living.  Asking if he can get nebulizer machine.    General: Cachectic adult male, no acute distress HEENT:  PERRL, NCAT, MMM Cardiovascular: RRR, nl S1, S2, no mrg, 2+ pulses Respiratory: CTAB, no increased WOB ABD:  NABS, soft, ND/NT MSK:  No LEE  Discharge Instructions       Discharge Orders   Future Orders Complete By Expires   Call MD for:  difficulty breathing, headache or visual disturbances  As directed    Call MD for:  extreme fatigue  As directed    Call MD for:  hives  As directed    Call MD for:  persistant dizziness or light-headedness  As directed    Call MD for:  persistant nausea and vomiting  As directed    Call MD for:  severe uncontrolled pain  As directed    Call MD for:  temperature >100.4  As directed    Diet general  As directed    Discharge instructions  As directed    Comments:     You were hospitalized with shortness of breath and were found to have a COPD exacerbation and possible pneumonia.  You were started on steroids and breathing treatments with nebulizer machine, which helped your breathing.  You were tested for heart failure, which can  also cause shortness of breath, however, your heart appears to be functioning well.  You were given IV antibiotics for possible pneumonia and most of your tests indicate that you are improving.  Your white blood cell count is elevated which may be due to the steroids you have been receiving.  Please have your doctor repeat this test in 1 week.  You will need to take antibiotics through October 11th, then stop.  Please continue duonebs three times daily and continue a Benno Brensinger prednisone taper.   Face-to-face encounter (required for Medicare/Medicaid patients)  As directed    Comments:     I Shahrukh Pasch certify that this patient is under my care and that I, or a nurse practitioner or physician's assistant working with me, had a face-to-face encounter that meets the physician face-to-face encounter requirements with this patient on 07/21/2013. The encounter with the patient was in whole, or in part for the following medical condition(s) which is the primary reason for home health care (List medical condition): 42 yo M with schizophrenia, tobacco abuse, presents with shortness of breath, pneumonia and COPD  exacerbation.  Has been hospitalized for 4 days and deconditioned.  Would benefit from RN to ensure he is continuing to improve on steroids and antibiotics, PT and OT to improve strength, endurance, and gait safety to maximize independence.   Questions:     The encounter with the patient was in whole, or in part, for the following medical condition, which is the primary reason for home health care:  yes   I certify that, based on my findings, the following services are medically necessary home health services:  Nursing   Physical therapy   My clinical findings support the need for the above services:  Cognitive impairments, dementia, or mental confusion  that make it unsafe to leave home   Shortness of breath with activity   Further, I certify that my clinical findings support that this patient is homebound due to:  Unable to leave home safely without assistance   Shortness of Breath with activity   Reason for Medically Necessary Home Health Services:  Skilled Nursing- Change/Decline in Patient Status   Skilled Nursing- Skilled Assessment/Observation   Therapy- Investment banker, operational, Patent examiner   Therapy- Therapeutic Exercises to Increase Strength and Endurance   Home Health  As directed    Questions:     To provide the following care/treatments:  PT   OT   RN   Increase activity slowly  As directed        Medication List    STOP taking these medications       albuterol 108 (90 BASE) MCG/ACT inhaler  Commonly known as:  PROVENTIL HFA;VENTOLIN HFA     ondansetron 4 MG tablet  Commonly known as:  ZOFRAN      TAKE these medications       acetaminophen 325 MG tablet  Commonly known as:  TYLENOL  Take 650 mg by mouth every 6 (six) hours as needed. Pain     ALPRAZolam 0.5 MG tablet  Commonly known as:  XANAX  Take 1 tablet (0.5 mg total) by mouth daily at 12 noon.     benztropine 0.5 MG tablet  Commonly known as:  COGENTIN  Take 0.5 mg by mouth 2 (two) times  daily. Given 2 hours after thyroid medication     doxepin 50 MG capsule  Commonly known as:  SINEQUAN  Take 50 mg by mouth at bedtime.  ENSURE PO  Take 1 Can by mouth 3 (three) times daily with meals.     ferrous sulfate 325 (65 FE) MG tablet  Take 325 mg by mouth daily with breakfast.     FLUoxetine 20 MG capsule  Commonly known as:  PROZAC  Take 20 mg by mouth daily. With 10 mg to = 30 mg     FLUoxetine 10 MG capsule  Commonly known as:  PROZAC  Take 10 mg by mouth daily.     Fluticasone-Salmeterol 100-50 MCG/DOSE Aepb  Commonly known as:  ADVAIR  Inhale 1 puff into the lungs 2 (two) times daily.     guaifenesin 100 MG/5ML syrup  Commonly known as:  ROBITUSSIN  Take 200 mg by mouth every 4 (four) hours as needed for cough.     ipratropium-albuterol 0.5-2.5 (3) MG/3ML Soln  Commonly known as:  DUONEB  Take 3 mLs by nebulization 3 (three) times daily.     levofloxacin 500 MG tablet  Commonly known as:  LEVAQUIN  Take 1 tablet (500 mg total) by mouth daily.     levothyroxine 75 MCG tablet  Commonly known as:  SYNTHROID, LEVOTHROID  Take 75 mcg by mouth daily.     loratadine 10 MG tablet  Commonly known as:  CLARITIN  Take 10 mg by mouth daily.     megestrol 40 MG/ML suspension  Commonly known as:  MEGACE  Take 400 mg by mouth 2 (two) times daily.     mirtazapine 45 MG tablet  Commonly known as:  REMERON  Take 22.5 mg by mouth at bedtime.     OLANZapine 15 MG tablet  Commonly known as:  ZYPREXA  Take 15 mg by mouth at bedtime.     omeprazole 40 MG capsule  Commonly known as:  PRILOSEC  Take 40 mg by mouth daily.     ondansetron 4 MG disintegrating tablet  Commonly known as:  ZOFRAN ODT  Take 1 tablet (4 mg total) by mouth every 8 (eight) hours as needed for nausea.     phenytoin 100 MG ER capsule  Commonly known as:  DILANTIN  Take 300 mg by mouth at bedtime.     polyethylene glycol powder powder  Commonly known as:  GLYCOLAX/MIRALAX  Take 17 g  by mouth daily.     predniSONE 10 MG tablet  Commonly known as:  DELTASONE  Take 5 tabs daily on day 1, 4 tabs on day 2, 3 tabs on day 3, 2 tabs on day 4, 1 tab on day 5, then stop.     simvastatin 10 MG tablet  Commonly known as:  ZOCOR  Take 10 mg by mouth at bedtime.     solifenacin 5 MG tablet  Commonly known as:  VESICARE  Take 5 mg by mouth daily.     sucralfate 1 G tablet  Commonly known as:  CARAFATE  Take 1 g by mouth 4 (four) times daily.     traMADol 50 MG tablet  Commonly known as:  ULTRAM  Take 50 mg by mouth every 6 (six) hours as needed for pain (pain).     zolpidem 5 MG tablet  Commonly known as:  AMBIEN  Take 1 tablet (5 mg total) by mouth at bedtime.       Follow-up Information   Follow up with Default, Provider, MD. Schedule an appointment as soon as possible for a visit in 1 week.   Contact information:   [address] [employer]  [place] [address] [phone]        The results of significant diagnostics from this hospitalization (including imaging, microbiology, ancillary and laboratory) are listed below for reference.    Significant Diagnostic Studies: Dg Chest 2 View  07/17/2013   CLINICAL DATA:  Shortness of breath  EXAM: CHEST  2 VIEW  COMPARISON:  03/09/2013  FINDINGS: The cardiac shadow is stable. Some diffuse chronic interstitial changes are again noted. Increased vascular congestion with interstitial edema is noted. No focal confluent infiltrate is seen.  IMPRESSION: Changes consistent with congestive failure.   Electronically Signed   By: Alcide Clever M.D.   On: 07/17/2013 20:39   Ct Angio Chest Pe W/cm &/or Wo Cm  07/18/2013   CLINICAL DATA:  Fever and cough  EXAM: CT ANGIOGRAPHY CHEST WITH CONTRAST  TECHNIQUE: Multidetector CT imaging of the chest was performed using the standard protocol during bolus administration of intravenous contrast. Multiplanar CT image reconstructions including MIPs were obtained to evaluate the vascular anatomy.   CONTRAST:  OMNIPAQUE IOHEXOL 350 MG/ML SOLN  COMPARISON:  Chest radiograph same date. No prior CT for comparison.  FINDINGS: Heart size is normal. No pericardial or pleural effusion. Mild right hilar nodal prominence with representative node measuring 5 mm image 49.  No focal filling defect is identified up to and including the 3rd order pulmonary arteries to suggest acute pulmonary embolism. The examination is technically adequate for evaluation for pulmonary emboli up to and including the 3rd order pulmonary arteries.  Incomplete imaging of the upper abdomen demonstrates incomplete visualization of low-density left renal cortical lesion image 104, statistically less likely a cyst but not further evaluated.  Diffuse severe emphysematous changes are noted with central bronchial wall thickening and right greater than left apical pleural thickening/scarring. No pneumothorax. No focal pulmonary opacity. Central airways are patent. No acute osseous finding.  Review of the MIP images confirms the above findings.  IMPRESSION: No CT evidence for acute pulmonary embolism.  Diffuse severe emphysematous change with central bronchial wall thickening which could indicate superimposed chronic bronchitis. No focal pulmonary opacification.   Electronically Signed   By: Christiana Pellant M.D.   On: 07/18/2013 00:04    Microbiology: Recent Results (from the past 240 hour(s))  CULTURE, BLOOD (ROUTINE X 2)     Status: None   Collection Time    07/18/13  2:29 AM      Result Value Range Status   Specimen Description BLOOD LEFT ARM   Final   Special Requests BOTTLES DRAWN AEROBIC ONLY 5CC   Final   Culture  Setup Time     Final   Value: 07/18/2013 15:49     Performed at [facility]   Culture     Final   Value:        BLOOD CULTURE RECEIVED NO GROWTH TO DATE CULTURE WILL BE HELD FOR 5 DAYS BEFORE ISSUING A FINAL NEGATIVE REPORT     Performed at [facility]   Report Status PENDING   Incomplete   CULTURE, BLOOD (ROUTINE X 2)     Status: None   Collection Time    07/18/13  2:33 AM      Result Value Range Status   Specimen Description BLOOD RIGHT ARM   Final   Special Requests     Final   Value: BOTTLES DRAWN AEROBIC AND ANAEROBIC 5CC BOTH BOTTLES   Culture  Setup Time     Final   Value: 07/18/2013 15:49  Performed at [facility]     Final   Value:        BLOOD CULTURE RECEIVED NO GROWTH TO DATE CULTURE WILL BE HELD FOR 5 DAYS BEFORE ISSUING A FINAL NEGATIVE REPORT     Performed at [facility]   Report Status PENDING   Incomplete  MRSA PCR SCREENING     Status: Abnormal   Collection Time    07/18/13  2:56 AM      Result Value Range Status   MRSA by PCR POSITIVE (*) NEGATIVE Final   Comment:            The GeneXpert MRSA Assay (FDA     approved for NASAL specimens     only), is one component of a     comprehensive MRSA colonization     surveillance program. It is not     intended to diagnose MRSA     infection nor to guide or     monitor treatment for     MRSA infections.     RESULT CALLED TO, READ BACK BY AND VERIFIED WITH:     CALLED TO RN EMILY HARLESS [id number] @[id number]  THANEY     Performed at [facility]  RESPIRATORY VIRUS PANEL     Status: None   Collection Time    07/18/13  3:04 AM      Result Value Range Status   Source - RVPAN NOSE   Final   Respiratory Syncytial Virus A NOT DETECTED   Final   Respiratory Syncytial Virus B NOT DETECTED   Final   Influenza A NOT DETECTED   Final   Influenza B NOT DETECTED   Final   Parainfluenza 1 NOT DETECTED   Final   Parainfluenza 2 NOT DETECTED   Final   Parainfluenza 3 NOT DETECTED   Final   Metapneumovirus NOT DETECTED   Final   Rhinovirus NOT DETECTED   Final   Adenovirus NOT DETECTED   Final   Influenza A H1 NOT DETECTED   Final   Influenza A H3 NOT DETECTED   Final   Comment: (NOTE)           Normal Reference Range for each Analyte: NOT DETECTED     Testing performed using the  Luminex xTAG Respiratory Viral Panel test     kit.     This test was developed and its performance characteristics determined     by [facility]. It has not been cleared or approved by the [place]     Food and Drug Administration. This test is used for clinical purposes.     It should not be regarded as investigational or for research. This     laboratory is certified under the Clinical Laboratory Improvement     Amendments of 1988 (CLIA) as qualified to perform high complexity     clinical laboratory testing.     Performed at [facility]: Basic Metabolic Panel:  Recent Labs Lab 07/17/13 2025 07/18/13 0231 07/19/13 0425 07/20/13 0408 07/21/13 0556  NA 138 136 137 137 137  K 2.9* 3.0* 3.4* 3.3* 4.3  CL 100 98 106 104 107  CO2 25 24 20 21 23   GLUCOSE 76 64* 160* 130* 122*  BUN 12 11 9 7 13   CREATININE 1.04 0.96 0.87 0.91 1.19  CALCIUM 8.0* 8.3* 7.5* 7.5* 7.9*  MG  --  1.5  --   --   --  Liver Function Tests:  Recent Labs Lab 07/18/13 0231  AST 8  ALT 10  ALKPHOS 74  BILITOT 0.3  PROT 6.4  ALBUMIN 2.3*   No results found for this basename: LIPASE, AMYLASE,  in the last 168 hours No results found for this basename: AMMONIA,  in the last 168 hours CBC:  Recent Labs Lab 07/17/13 2025 07/18/13 0455 07/20/13 0408 07/21/13 0556  WBC 17.8* 14.2* 21.0* 22.1*  HGB 11.0* 10.3* 9.2* 9.7*  HCT 31.6* 30.3* 27.5* 29.3*  MCV 88.8 88.9 91.1 90.7  PLT 383 384 377 394   Cardiac Enzymes:  Recent Labs Lab 07/17/13 2140  TROPONINI <0.30   BNP: BNP (last 3 results)  Recent Labs  07/17/13 2048 07/19/13 0425  PROBNP 730.8* 1041.0*   CBG: No results found for this basename: GLUCAP,  in the last 168 hours  Time coordinating discharge: 45 minutes  Signed:  Emmanuela Ghazi  [facility] 07/21/2013, 11:58 AM

## 2013-07-24 LAB — CULTURE, BLOOD (ROUTINE X 2)
Culture: NO GROWTH
Culture: NO GROWTH

## 2013-09-12 ENCOUNTER — Emergency Department (HOSPITAL_COMMUNITY): Payer: Medicare Other

## 2013-09-12 ENCOUNTER — Observation Stay (HOSPITAL_COMMUNITY): Payer: Medicare Other

## 2013-09-12 ENCOUNTER — Encounter (HOSPITAL_COMMUNITY): Payer: Self-pay | Admitting: Emergency Medicine

## 2013-09-12 ENCOUNTER — Observation Stay (HOSPITAL_COMMUNITY)
Admission: EM | Admit: 2013-09-12 | Discharge: 2013-09-13 | Disposition: A | Discharge status: Skilled Nursing Facility | Payer: Medicare Other | Attending: Family Medicine | Admitting: Family Medicine

## 2013-09-12 DIAGNOSIS — J9601 Acute respiratory failure with hypoxia: Secondary | ICD-10-CM

## 2013-09-12 DIAGNOSIS — M79609 Pain in unspecified limb: Secondary | ICD-10-CM

## 2013-09-12 DIAGNOSIS — M25569 Pain in unspecified knee: Principal | ICD-10-CM

## 2013-09-12 DIAGNOSIS — G40909 Epilepsy, unspecified, not intractable, without status epilepticus: Secondary | ICD-10-CM | POA: Insufficient documentation

## 2013-09-12 DIAGNOSIS — R0602 Shortness of breath: Secondary | ICD-10-CM

## 2013-09-12 DIAGNOSIS — Z79899 Other long term (current) drug therapy: Secondary | ICD-10-CM | POA: Insufficient documentation

## 2013-09-12 DIAGNOSIS — R0902 Hypoxemia: Secondary | ICD-10-CM

## 2013-09-12 DIAGNOSIS — E860 Dehydration: Secondary | ICD-10-CM

## 2013-09-12 DIAGNOSIS — F17213 Nicotine dependence, cigarettes, with withdrawal: Secondary | ICD-10-CM

## 2013-09-12 DIAGNOSIS — R64 Cachexia: Secondary | ICD-10-CM

## 2013-09-12 DIAGNOSIS — R509 Fever, unspecified: Secondary | ICD-10-CM

## 2013-09-12 DIAGNOSIS — E039 Hypothyroidism, unspecified: Secondary | ICD-10-CM | POA: Insufficient documentation

## 2013-09-12 DIAGNOSIS — G8929 Other chronic pain: Secondary | ICD-10-CM | POA: Insufficient documentation

## 2013-09-12 DIAGNOSIS — D649 Anemia, unspecified: Secondary | ICD-10-CM

## 2013-09-12 DIAGNOSIS — K219 Gastro-esophageal reflux disease without esophagitis: Secondary | ICD-10-CM | POA: Insufficient documentation

## 2013-09-12 DIAGNOSIS — J441 Chronic obstructive pulmonary disease with (acute) exacerbation: Secondary | ICD-10-CM

## 2013-09-12 DIAGNOSIS — F17200 Nicotine dependence, unspecified, uncomplicated: Secondary | ICD-10-CM | POA: Insufficient documentation

## 2013-09-12 DIAGNOSIS — J189 Pneumonia, unspecified organism: Secondary | ICD-10-CM

## 2013-09-12 DIAGNOSIS — J4489 Other specified chronic obstructive pulmonary disease: Secondary | ICD-10-CM | POA: Insufficient documentation

## 2013-09-12 DIAGNOSIS — M25561 Pain in right knee: Secondary | ICD-10-CM | POA: Diagnosis present

## 2013-09-12 DIAGNOSIS — D72829 Elevated white blood cell count, unspecified: Secondary | ICD-10-CM

## 2013-09-12 DIAGNOSIS — F259 Schizoaffective disorder, unspecified: Secondary | ICD-10-CM | POA: Insufficient documentation

## 2013-09-12 DIAGNOSIS — I959 Hypotension, unspecified: Secondary | ICD-10-CM

## 2013-09-12 DIAGNOSIS — E876 Hypokalemia: Secondary | ICD-10-CM

## 2013-09-12 DIAGNOSIS — J449 Chronic obstructive pulmonary disease, unspecified: Secondary | ICD-10-CM | POA: Insufficient documentation

## 2013-09-12 DIAGNOSIS — F1910 Other psychoactive substance abuse, uncomplicated: Secondary | ICD-10-CM

## 2013-09-12 DIAGNOSIS — M24573 Contracture, unspecified ankle: Secondary | ICD-10-CM | POA: Insufficient documentation

## 2013-09-12 LAB — URINALYSIS, ROUTINE W REFLEX MICROSCOPIC
Glucose, UA: NEGATIVE mg/dL
Ketones, ur: NEGATIVE mg/dL
Leukocytes, UA: NEGATIVE
Nitrite: NEGATIVE
Protein, ur: NEGATIVE mg/dL
Specific Gravity, Urine: 1.014 (ref 1.005–1.030)
Urobilinogen, UA: 0.2 mg/dL (ref 0.0–1.0)
pH: 7 (ref 5.0–8.0)

## 2013-09-12 LAB — BASIC METABOLIC PANEL
CO2: 29 mEq/L (ref 19–32)
Calcium: 9.2 mg/dL (ref 8.4–10.5)
Creatinine, Ser: 0.86 mg/dL (ref 0.50–1.35)
GFR calc Af Amer: >90 mL/min (ref >90)
Potassium: 3.7 mEq/L (ref 3.5–5.1)
Sodium: 137 mEq/L (ref 135–145)

## 2013-09-12 LAB — CG4 I-STAT (LACTIC ACID): Lactic Acid, Venous: 1.97 mmol/L (ref 0.5–2.2)

## 2013-09-12 LAB — CBC
MCH: 30 pg (ref 26.0–34.0)
MCV: 92 fL (ref 78.0–100.0)
Platelets: 371 10*3/uL (ref 150–400)
RBC: 4.14 MIL/uL — ABNORMAL LOW (ref 4.22–5.81)
RDW: 13.5 % (ref 11.5–15.5)
WBC: 16.3 10*3/uL — ABNORMAL HIGH (ref 4.0–10.5)

## 2013-09-12 LAB — HEPATIC FUNCTION PANEL
ALT: 68 U/L — ABNORMAL HIGH (ref 0–53)
Bilirubin, Direct: <0.1 mg/dL (ref 0.0–0.3)
Total Protein: 6.8 g/dL (ref 6.0–8.3)

## 2013-09-12 LAB — CK: Total CK: 221 U/L (ref 7–232)

## 2013-09-12 LAB — PHOSPHORUS: Phosphorus: 1.5 mg/dL — ABNORMAL LOW (ref 2.3–4.6)

## 2013-09-12 MED ORDER — LIDOCAINE HCL 1 % IJ SOLN
INTRAMUSCULAR | Status: AC
  Administered 2013-09-12: 18:00:00
  Filled 2013-09-12: qty 20

## 2013-09-12 MED ORDER — PANTOPRAZOLE SODIUM 40 MG PO TBEC
80.0000 mg | DELAYED_RELEASE_TABLET | Freq: Every day | ORAL | Status: DC
  Administered 2013-09-12 – 2013-09-13 (×2): 80 mg via ORAL
  Filled 2013-09-12 (×2): qty 2

## 2013-09-12 MED ORDER — MORPHINE SULFATE 4 MG/ML IJ SOLN
4.0000 mg | Freq: Once | INTRAMUSCULAR | Status: AC
  Administered 2013-09-12: 4 mg via INTRAVENOUS
  Filled 2013-09-12: qty 1

## 2013-09-12 MED ORDER — MOMETASONE FURO-FORMOTEROL FUM 100-5 MCG/ACT IN AERO
2.0000 | INHALATION_SPRAY | Freq: Two times a day (BID) | RESPIRATORY_TRACT | Status: DC
  Administered 2013-09-12 – 2013-09-13 (×2): 2 via RESPIRATORY_TRACT
  Filled 2013-09-12 (×3): qty 8.8

## 2013-09-12 MED ORDER — MIRTAZAPINE 7.5 MG PO TABS
22.5000 mg | ORAL_TABLET | Freq: Every day | ORAL | Status: DC
  Administered 2013-09-12: 22.5 mg via ORAL
  Filled 2013-09-12 (×2): qty 1

## 2013-09-12 MED ORDER — ALBUTEROL SULFATE (5 MG/ML) 0.5% IN NEBU
2.5000 mg | INHALATION_SOLUTION | Freq: Three times a day (TID) | RESPIRATORY_TRACT | Status: DC
  Administered 2013-09-12 – 2013-09-13 (×3): 2.5 mg via RESPIRATORY_TRACT
  Filled 2013-09-12 (×3): qty 0.5

## 2013-09-12 MED ORDER — FLUOXETINE HCL 10 MG PO CAPS
10.0000 mg | ORAL_CAPSULE | Freq: Every day | ORAL | Status: DC
  Administered 2013-09-13: 10 mg via ORAL
  Filled 2013-09-12: qty 1

## 2013-09-12 MED ORDER — IPRATROPIUM-ALBUTEROL 0.5-2.5 (3) MG/3ML IN SOLN: 3.0000 mL | Freq: Three times a day (TID) | RESPIRATORY_TRACT | Status: DC

## 2013-09-12 MED ORDER — DARIFENACIN HYDROBROMIDE ER 7.5 MG PO TB24
7.5000 mg | ORAL_TABLET | Freq: Every day | ORAL | Status: DC
  Administered 2013-09-13: 7.5 mg via ORAL
  Filled 2013-09-12: qty 1

## 2013-09-12 MED ORDER — SODIUM CHLORIDE 0.9 % IV BOLUS (SEPSIS)
500.0000 mL | Freq: Once | INTRAVENOUS | Status: AC
  Administered 2013-09-12: 500 mL via INTRAVENOUS

## 2013-09-12 MED ORDER — SUCRALFATE 1 G PO TABS
1.0000 g | ORAL_TABLET | Freq: Four times a day (QID) | ORAL | Status: DC
  Administered 2013-09-12 – 2013-09-13 (×3): 1 g via ORAL
  Filled 2013-09-12 (×5): qty 1

## 2013-09-12 MED ORDER — LORATADINE 10 MG PO TABS
10.0000 mg | ORAL_TABLET | Freq: Every day | ORAL | Status: DC
  Administered 2013-09-12 – 2013-09-13 (×2): 10 mg via ORAL
  Filled 2013-09-12 (×2): qty 1

## 2013-09-12 MED ORDER — ACETAMINOPHEN 325 MG PO TABS: 650.0000 mg | ORAL_TABLET | Freq: Four times a day (QID) | ORAL | Status: DC | PRN

## 2013-09-12 MED ORDER — LEVOTHYROXINE SODIUM 75 MCG PO TABS
75.0000 ug | ORAL_TABLET | Freq: Every day | ORAL | Status: DC
Start: 2013-09-13 — End: 2013-09-13
  Administered 2013-09-13: 75 ug via ORAL
  Filled 2013-09-12 (×2): qty 1

## 2013-09-12 MED ORDER — PHENYTOIN SODIUM EXTENDED 100 MG PO CAPS
300.0000 mg | ORAL_CAPSULE | Freq: Every day | ORAL | Status: DC
  Administered 2013-09-12: 300 mg via ORAL
  Filled 2013-09-12 (×2): qty 3

## 2013-09-12 MED ORDER — POLYETHYLENE GLYCOL 3350 17 GM/SCOOP PO POWD
17.0000 g | Freq: Every day | ORAL | Status: DC
  Filled 2013-09-12: qty 255

## 2013-09-12 MED ORDER — FERROUS SULFATE 325 (65 FE) MG PO TABS
325.0000 mg | ORAL_TABLET | Freq: Every day | ORAL | Status: DC
  Administered 2013-09-13: 325 mg via ORAL
  Filled 2013-09-12 (×2): qty 1

## 2013-09-12 MED ORDER — MUPIROCIN 2 % EX OINT
1.0000 "application " | TOPICAL_OINTMENT | Freq: Two times a day (BID) | CUTANEOUS | Status: DC
  Administered 2013-09-13 (×2): 1 via NASAL
  Filled 2013-09-12: qty 22

## 2013-09-12 MED ORDER — ENSURE COMPLETE PO LIQD
237.0000 mL | Freq: Three times a day (TID) | ORAL | Status: DC
  Administered 2013-09-13 (×2): 237 mL via ORAL

## 2013-09-12 MED ORDER — ZOLPIDEM TARTRATE 5 MG PO TABS
5.0000 mg | ORAL_TABLET | Freq: Every day | ORAL | Status: DC
  Administered 2013-09-12: 5 mg via ORAL
  Filled 2013-09-12: qty 1

## 2013-09-12 MED ORDER — FLUOXETINE HCL 20 MG PO CAPS
20.0000 mg | ORAL_CAPSULE | Freq: Every day | ORAL | Status: DC
  Administered 2013-09-13: 20 mg via ORAL
  Filled 2013-09-12: qty 1

## 2013-09-12 MED ORDER — ONDANSETRON 4 MG PO TBDP
4.0000 mg | ORAL_TABLET | Freq: Three times a day (TID) | ORAL | Status: DC | PRN
  Filled 2013-09-12: qty 1

## 2013-09-12 MED ORDER — POLYETHYLENE GLYCOL 3350 17 G PO PACK
17.0000 g | PACK | Freq: Every day | ORAL | Status: DC
  Administered 2013-09-13: 17 g via ORAL
  Filled 2013-09-12: qty 1

## 2013-09-12 MED ORDER — K PHOS MONO-SOD PHOS DI & MONO 155-852-130 MG PO TABS
500.0000 mg | ORAL_TABLET | Freq: Once | ORAL | Status: AC
  Administered 2013-09-12: 500 mg via ORAL
  Filled 2013-09-12 (×2): qty 2

## 2013-09-12 MED ORDER — SIMVASTATIN 10 MG PO TABS
10.0000 mg | ORAL_TABLET | Freq: Every day | ORAL | Status: DC
  Administered 2013-09-12: 10 mg via ORAL
  Filled 2013-09-12 (×2): qty 1

## 2013-09-12 MED ORDER — CHLORHEXIDINE GLUCONATE CLOTH 2 % EX PADS
6.0000 | MEDICATED_PAD | Freq: Every day | CUTANEOUS | Status: DC
  Administered 2013-09-13: 6 via TOPICAL

## 2013-09-12 MED ORDER — TRAMADOL HCL 50 MG PO TABS: 50.0000 mg | ORAL_TABLET | Freq: Four times a day (QID) | ORAL | Status: DC | PRN

## 2013-09-12 MED ORDER — OLANZAPINE 7.5 MG PO TABS
15.0000 mg | ORAL_TABLET | Freq: Every day | ORAL | Status: DC
  Administered 2013-09-12: 15 mg via ORAL
  Filled 2013-09-12 (×2): qty 2

## 2013-09-12 MED ORDER — HEPARIN SODIUM (PORCINE) 5000 UNIT/ML IJ SOLN
5000.0000 [IU] | Freq: Three times a day (TID) | INTRAMUSCULAR | Status: DC
  Administered 2013-09-12 – 2013-09-13 (×3): 5000 [IU] via SUBCUTANEOUS
  Filled 2013-09-12 (×5): qty 1

## 2013-09-12 MED ORDER — IPRATROPIUM BROMIDE 0.02 % IN SOLN
0.5000 mg | Freq: Three times a day (TID) | RESPIRATORY_TRACT | Status: DC
  Administered 2013-09-12 – 2013-09-13 (×3): 0.5 mg via RESPIRATORY_TRACT
  Filled 2013-09-12 (×3): qty 2.5

## 2013-09-12 MED ORDER — KETOROLAC TROMETHAMINE 30 MG/ML IJ SOLN: 30.0000 mg | Freq: Once | INTRAMUSCULAR | Status: DC

## 2013-09-12 MED ORDER — KETOROLAC TROMETHAMINE 60 MG/2ML IM SOLN
60.0000 mg | Freq: Once | INTRAMUSCULAR | Status: AC
  Administered 2013-09-12: 60 mg via INTRAMUSCULAR
  Filled 2013-09-12: qty 2

## 2013-09-12 MED ORDER — ALPRAZOLAM 0.5 MG PO TABS
0.5000 mg | ORAL_TABLET | Freq: Every day | ORAL | Status: DC
  Administered 2013-09-13: 0.5 mg via ORAL
  Filled 2013-09-12: qty 1

## 2013-09-12 MED ORDER — BENZTROPINE MESYLATE 0.5 MG PO TABS
0.5000 mg | ORAL_TABLET | Freq: Two times a day (BID) | ORAL | Status: DC
  Administered 2013-09-12 – 2013-09-13 (×2): 0.5 mg via ORAL
  Filled 2013-09-12 (×4): qty 1

## 2013-09-12 MED ORDER — SODIUM CHLORIDE 0.9 % IV SOLN
INTRAVENOUS | Status: AC
  Administered 2013-09-12: 23:00:00 via INTRAVENOUS

## 2013-09-12 MED ORDER — DOXEPIN HCL 50 MG PO CAPS
50.0000 mg | ORAL_CAPSULE | Freq: Every day | ORAL | Status: DC
  Administered 2013-09-12: 50 mg via ORAL
  Filled 2013-09-12 (×3): qty 1

## 2013-09-12 NOTE — ED Notes (Signed)
Patient transported to X-ray 

## 2013-09-12 NOTE — ED Notes (Signed)
Bed: WA07 Expected date:  Expected time:  Means of arrival:  Comments: EMS-back pain (chronic)

## 2013-09-12 NOTE — Progress Notes (Signed)
Synovial fluid drawn at bedside from right knee by Dr. Ranell Patrick.

## 2013-09-12 NOTE — Consult Note (Signed)
Reason for Consult:bilateral knee pain Referring Physician: EDP  [name] [name] is an 42 y.o. male.  HPI: 42 yo nonambulator who presents with an elevated WBC and a one day history of bilateral knee pain.  No trauma.  No other complaints  Past Medical History  Diagnosis Date  . Chronic pain   . Anxiety   . Schizoaffective disorder   . Hearing loss   . Seizure   . Arthritis   . Asthma   . Foot drop   . Anemia   . Dehydration   . Hypotension   . GERD (gastroesophageal reflux disease)   . Hypothyroidism   . Constipation   . Polysubstance abuse   . Cancer     History reviewed. No pertinent past surgical history.  History reviewed. No pertinent family history.  Social History:  reports that he has been smoking Cigarettes.  He has a 40.5 pack-year smoking history. He has never used smokeless tobacco. He reports that he does not drink alcohol or use illicit drugs.  Allergies:  Allergies  Allergen Reactions  . Fexofenadine Hcl Other (See Comments)    unknown  . Percocet [Oxycodone-Acetaminophen] Other (See Comments)    unknown  . Thorazine [Chlorpromazine Hcl] Other (See Comments)    unknown    Medications: I have reviewed the patient's current medications.  Results for orders placed during the hospital encounter of 09/12/13 (from the past 48 hour(s))  CBC     Status: Abnormal   Collection Time    09/12/13  4:40 PM      Result Value Range   WBC 16.3 (*) 4.0 - 10.5 K/uL   RBC 4.14 (*) 4.22 - 5.81 MIL/uL   Hemoglobin 12.4 (*) 13.0 - 17.0 g/dL   HCT 45.4 (*) 09.8 - 11.9 %   MCV 92.0  78.0 - 100.0 fL   MCH 30.0  26.0 - 34.0 pg   MCHC 32.5  30.0 - 36.0 g/dL   RDW 14.7  82.9 - 56.2 %   Platelets 371  150 - 400 K/uL  BASIC METABOLIC PANEL     Status: None   Collection Time    09/12/13  4:40 PM      Result Value Range   Sodium 137  135 - 145 mEq/L   Potassium 3.7  3.5 - 5.1 mEq/L   Chloride 97  96 - 112 mEq/L   CO2 29  19 - 32 mEq/L   Glucose, Bld 88  70 - 99 mg/dL    BUN 6  6 - 23 mg/dL   Creatinine, Ser 1.30  0.50 - 1.35 mg/dL   Calcium 9.2  8.4 - 86.5 mg/dL   GFR calc non Af Amer >90  >90 mL/min   GFR calc Af Amer >90  >90 mL/min   Comment: (NOTE)     The eGFR has been calculated using the CKD EPI equation.     This calculation has not been validated in all clinical situations.     eGFR's persistently <90 mL/min signify possible Chronic Kidney     Disease.  CK     Status: None   Collection Time    09/12/13  4:40 PM      Result Value Range   Total CK 221  7 - 232 U/L  HEPATIC FUNCTION PANEL     Status: Abnormal   Collection Time    09/12/13  4:40 PM      Result Value Range   Total Protein 6.8  6.0 -  8.3 g/dL   Albumin 3.3 (*) 3.5 - 5.2 g/dL   AST 31  0 - 37 U/L   ALT 68 (*) 0 - 53 U/L   Alkaline Phosphatase 75  39 - 117 U/L   Total Bilirubin 0.3  0.3 - 1.2 mg/dL   Bilirubin, Direct <4.0  0.0 - 0.3 mg/dL   Comment: REPEATED TO VERIFY   Indirect Bilirubin NOT CALCULATED  0.3 - 0.9 mg/dL  PHOSPHORUS     Status: Abnormal   Collection Time    09/12/13  4:40 PM      Result Value Range   Phosphorus 1.5 (*) 2.3 - 4.6 mg/dL  URINALYSIS, ROUTINE W REFLEX MICROSCOPIC     Status: None   Collection Time    09/12/13  7:13 PM      Result Value Range   Color, Urine YELLOW  YELLOW   APPearance CLEAR  CLEAR   Specific Gravity, Urine 1.014  1.005 - 1.030   pH 7.0  5.0 - 8.0   Glucose, UA NEGATIVE  NEGATIVE mg/dL   Hgb urine dipstick NEGATIVE  NEGATIVE   Bilirubin Urine NEGATIVE  NEGATIVE   Ketones, ur NEGATIVE  NEGATIVE mg/dL   Protein, ur NEGATIVE  NEGATIVE mg/dL   Urobilinogen, UA 0.2  0.0 - 1.0 mg/dL   Nitrite NEGATIVE  NEGATIVE   Leukocytes, UA NEGATIVE  NEGATIVE   Comment: MICROSCOPIC NOT DONE ON URINES WITH NEGATIVE PROTEIN, BLOOD, LEUKOCYTES, NITRITE, OR GLUCOSE <1000 mg/dL.  CG4 I-STAT (LACTIC ACID)     Status: None   Collection Time    09/12/13  8:01 PM      Result Value Range   Lactic Acid, Venous 1.97  0.5 - 2.2 mmol/L    Dg  Chest 2 View  09/12/2013   CLINICAL DATA:  Short of breath  EXAM: CHEST  2 VIEW  COMPARISON:  07/17/2013  FINDINGS: Normal heart size. Increased linear markings with fibrosis throughout both lungs are stable. No definite new consolidation. No pneumothorax. No pleural effusion. Hyperaeration and bronchitic changes are noted.  IMPRESSION: Chronic changes.  No active cardiopulmonary disease.   Electronically Signed   By: Maryclare Bean M.D.   On: 09/12/2013 19:46   Dg Knee Complete 4 Views Left  09/12/2013   CLINICAL DATA:  Knee pain.  EXAM: LEFT KNEE - COMPLETE 4+ VIEW  COMPARISON:  None.  FINDINGS: Patchy diffuse osteopenia with regions of bone sclerosis noted. A ametabolic process including osteomalacia or renal osteodystrophy may be present. Anemia or malignancy may be present. Osteoporosis may be present. No evidence of fracture or dislocation. Sclerotic densities in of the proximal tibia and distal femur may represent infarcts.  IMPRESSION: No acute abnormality. Abnormal bony mineralization as described above. Metabolic and hematologic workup may prove useful.   Electronically Signed   By: Maisie Fus  Register   On: 09/12/2013 17:24   Dg Knee Complete 4 Views Right  09/12/2013   CLINICAL DATA:  Bilateral knee pain without trauma. History of COPD. Polysubstance abuse. Shortness of breath.  EXAM: RIGHT KNEE - COMPLETE 4+ VIEW  COMPARISON:  None.  FINDINGS: Heterogeneous osseous density. Underlying osteopenia. No acute fracture or dislocation. No joint effusion. Increased density within the femoral condyles, suspicious for areas of avascular necrosis.  IMPRESSION: Nonspecific heterogeneous osseous density. 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 As described on tibia/fibula films, metabolic workup should be considered. Metastatic disease or other marrow replacing process cannot be excluded.   Electronically Signed   By:  Jeronimo Greaves M.D.   On: 09/12/2013 17:44    ROS Blood pressure  132/81, pulse 116, temperature 99.7 F (37.6 C), temperature source Rectal, resp. rate 22, SpO2 98.00%. Physical Exam Thin adult male in no apparent distress on ED stretcher. Bilateral knee exams show effusions on both knees.  Non tender, no erythema, minimal pain with PROM.  No flexion contractures.  Bilateral cavus foot deformity consistent with chronic nonambulator status. Knees are stable from ligamentous standpoint. No joint line tenderness.  Assessment/Plan: Bilateral knee pain and elevated WBC with very abnormal appearing bone on knee XRAYS.  Medicine admitting for fever work up and metabolic work up.  Right knee aspirated under sterile conditions in the ED.  20 cc of clear synovial fluid retrieved.  Sent for cell count and differential and gram stain and culture.    Happy to follow along side you.  MRI of both knees would be a good starting point.  Low Phosphorous noted as well.  Parathyroid levels will be checked.  Decubitus precautions.  Thanks!  Aidynn Polendo,STEVEN R 09/12/2013, 8:39 PM  [phone]

## 2013-09-12 NOTE — ED Provider Notes (Signed)
TIME SEEN: 4:20 PM  CHIEF COMPLAINT: Bilateral knee pain  HPI: Patient is a 42 y.o. male with a history of chronic pain, schizoaffective disorder, seizures, polysubstance abuse, COPD, hypothyroidism, hard of hearing who uses a wheelchair and lives at [facility] who presents to the ED with bilateral, squeezing, aching knee pain that started this morning. Patient reports that he has had multiple falls recently but is not sure if he injured his knees. Denies any head injury. Denies any numbness or tingling. He is chronic weakness in his lower extremities. No fevers. No swelling of his legs.  Patient is very hard of hearing and is a very poor historian. History is limited as all questions had to be written down for patient to read.  Patient was recently admitted to the hospital October 2014 for healthcare associated pneumonia, hypotension, hyperkalemia, cachexia.  ROS: See HPI Constitutional: no fever  Eyes: no drainage  ENT: no runny nose   Cardiovascular:  no chest pain  Resp: no SOB  GI: no vomiting GU: no dysuria Integumentary: no rash  Allergy: no hives  Musculoskeletal: no leg swelling  Neurological: no slurred speech ROS otherwise negative  PAST MEDICAL HISTORY/PAST SURGICAL HISTORY:  Past Medical History  Diagnosis Date  . Chronic pain   . Anxiety   . Schizoaffective disorder   . Hearing loss   . Seizure   . Arthritis   . Asthma   . Foot drop   . Anemia   . Dehydration   . Hypotension   . GERD (gastroesophageal reflux disease)   . Hypothyroidism   . Constipation   . Polysubstance abuse   . Cancer     MEDICATIONS:  Prior to Admission medications   Medication Sig Start Date End Date Taking? Authorizing Provider  ALPRAZolam (XANAX) 0.5 MG tablet Take 1 tablet (0.5 mg total) by mouth daily at 12 noon. 07/21/13  Yes Renae Fickle, MD  benztropine (COGENTIN) 0.5 MG tablet Take 0.5 mg by mouth 2 (two) times daily. Given 2 hours after thyroid medication   Yes Historical  Provider, MD  doxepin (SINEQUAN) 50 MG capsule Take 50 mg by mouth at bedtime.   Yes Historical Provider, MD  ferrous sulfate 325 (65 FE) MG tablet Take 325 mg by mouth daily with breakfast.   Yes Historical Provider, MD  FLUoxetine (PROZAC) 10 MG capsule Take 10 mg by mouth daily.   Yes Historical Provider, MD  FLUoxetine (PROZAC) 20 MG capsule Take 20 mg by mouth daily. With 10 mg to = 30 mg   Yes Historical Provider, MD  Fluticasone-Salmeterol (ADVAIR) 100-50 MCG/DOSE AEPB Inhale 1 puff into the lungs 2 (two) times daily.   Yes Historical Provider, MD  ipratropium-albuterol (DUONEB) 0.5-2.5 (3) MG/3ML SOLN Take 3 mLs by nebulization 3 (three) times daily. 07/21/13  Yes Renae Fickle, MD  levothyroxine (SYNTHROID, LEVOTHROID) 75 MCG tablet Take 75 mcg by mouth daily.   Yes Historical Provider, MD  loratadine (CLARITIN) 10 MG tablet Take 10 mg by mouth daily.   Yes Historical Provider, MD  mirtazapine (REMERON) 45 MG tablet Take 22.5 mg by mouth at bedtime.   Yes Historical Provider, MD  Nutritional Supplements (ENSURE PO) Take 1 Can by mouth 3 (three) times daily with meals.   Yes Historical Provider, MD  OLANZapine (ZYPREXA) 15 MG tablet Take 15 mg by mouth at bedtime.   Yes Historical Provider, MD  omeprazole (PRILOSEC) 40 MG capsule Take 40 mg by mouth daily.    Yes Historical Provider, MD  phenytoin (DILANTIN) 100 MG ER capsule Take 300 mg by mouth at bedtime.   Yes Historical Provider, MD  polyethylene glycol powder (GLYCOLAX/MIRALAX) powder Take 17 g by mouth daily.   Yes Historical Provider, MD  simvastatin (ZOCOR) 10 MG tablet Take 10 mg by mouth at bedtime.   Yes Historical Provider, MD  solifenacin (VESICARE) 5 MG tablet Take 5 mg by mouth daily.    Yes Historical Provider, MD  sucralfate (CARAFATE) 1 G tablet Take 1 g by mouth 4 (four) times daily.   Yes Historical Provider, MD  zolpidem (AMBIEN) 5 MG tablet Take 1 tablet (5 mg total) by mouth at bedtime. 07/21/13  Yes Renae Fickle,  MD  acetaminophen (TYLENOL) 325 MG tablet Take 650 mg by mouth every 6 (six) hours as needed. Pain    Historical Provider, MD  guaifenesin (ROBITUSSIN) 100 MG/5ML syrup Take 200 mg by mouth every 4 (four) hours as needed for cough.    Historical Provider, MD  ondansetron (ZOFRAN ODT) 4 MG disintegrating tablet Take 1 tablet (4 mg total) by mouth every 8 (eight) hours as needed for nausea. 03/13/13   [name], MD  sucralfate (CARAFATE) 1 G tablet Take 1 g by mouth 4 (four) times daily. 06/03/12 07/17/13  Olivia Mackie, MD  traMADol (ULTRAM) 50 MG tablet Take 50 mg by mouth every 6 (six) hours as needed for pain (pain).    Historical Provider, MD    ALLERGIES:  Allergies  Allergen Reactions  . Fexofenadine Hcl Other (See Comments)    unknown  . Percocet [Oxycodone-Acetaminophen] Other (See Comments)    unknown  . Thorazine [Chlorpromazine Hcl] Other (See Comments)    unknown    SOCIAL HISTORY:  History  Substance Use Topics  . Smoking status: Current Every Day Smoker -- 1.50 packs/day for 27 years    Types: Cigarettes  . Smokeless tobacco: Never Used  . Alcohol Use: No    FAMILY HISTORY: History reviewed. No pertinent family history.  EXAM: BP 132/81  Pulse 116  Temp(Src) 99 F (37.2 C) (Oral)  Resp 22  SpO2 98% CONSTITUTIONAL: Alert and oriented and responds appropriately to questions. Well-appearing; well-nourished, thin, appears older than stated age HEAD: Normocephalic EYES: Conjunctivae clear, PERRL ENT: normal nose; no rhinorrhea; moist mucous membranes; pharynx without lesions noted NECK: Supple, no meningismus, no LAD  CARD: Tachycardic; S1 and S2 appreciated; no murmurs, no clicks, no rubs, no gallops RESP: Normal chest excursion without splinting or tachypnea; breath sounds clear and equal bilaterally; no wheezes, no rhonchi, no rales,  ABD/GI: Normal bowel sounds; non-distended; soft, non-tender, no rebound, no guarding BACK:  The back appears normal and is  non-tender to palpation, there is no CVA tenderness EXT: Patient is tender to palpation over his bilateral knees with no joint effusion, erythema, warmth or induration; no calf swelling or tenderness, no edema, pain with flexion of the knees past 90, no ligamentous laxity, 2+ DP pulses bilaterally, sensation to light touch intact diffusely, full range of motion in the bilateral hips and ankles and toes SKIN: Normal color for age and race; warm NEURO: Moves all extremities equally; sensation to light touch intact diffusely; chronic diffuse weakness in his lower extremities PSYCH: The patient's mood and manner are appropriate. Grooming and personal hygiene are appropriate.  MEDICAL DECISION MAKING: Patient here with nonspecific bilateral knee pain. Will obtain x-rays of bilateral knees given his history for falls although no obvious sign of injury on exam. Will also check basic labs, CK  level. Will give IVF as well as pt appears dry on exam.  Given his recent hospitalization, we'll get venous Dopplers of bilateral lower extremities to rule out DVT.  Will check rectal temp - oral was 99.  We'll give Toradol and reassess.   ED PROGRESS: Patient has a leukocytosis but this is improving since his last admission for healthcare associated pneumonia in October. His other labs are unremarkable. CK normal.  X-rays show nonspecific heterogeneous osseous density within the femoral condyles and areas suspicious for avascular necrosis in the right knee and patchy diffuse osteopenia and sclerotic densities in the proximal tibia and distal femur that may represent infarcts in the left knee.  No acute injury noted.     Doppler negative for DVT.  Pt reports pain is improved since Toradol given.  Given concerns for infarcts and avascular necrosis in the setting of leukocytosis and fever (temp 99.7 rectal about one hour after Toradol given) and recent sepsis, one possible cause is septic emboli. Will discuss with  orthopedics and hospitalist on call.  Discussed case with Dr. Ranell Patrick with orthopedics. He recommends patient admitted to medicine service and they will consult in the morning. He feels patient will likely need advanced imaging of his knees.  Dr. Ranell Patrick has seen pt in ED and aspirated both knees.  States fluid was clear and will send for cultures.  Doubt knees are the source of his infection.  D/w Dr. Julian Reil with hospitalist service for obs.  Will get advance imaging of knees.  Cultures pending.  Will hold abx at this time given no source and pt stable.  Ortho recommends myeloproliferative workup.  Layla Maw Shad Ledvina, DO 09/12/13 2021

## 2013-09-12 NOTE — Progress Notes (Signed)
Report called to [place], RN 5 EAST.

## 2013-09-12 NOTE — Progress Notes (Signed)
VASCULAR LAB PRELIMINARY  PRELIMINARY  PRELIMINARY  PRELIMINARY  Bilateral lower extremity venous Dopplers completed.    Preliminary report:  There is no DVT or SVT noted in the bilateral lower extremities.  Isabele Lollar, RVT 09/12/2013, 6:33 PM

## 2013-09-12 NOTE — ED Notes (Signed)
Patient complains of left leg pain from the knee down. Per EMS, patient has been complaining that it has been hurting all day.

## 2013-09-12 NOTE — H&P (Signed)
[facility] History and Physical  Conlee Sliter ZOX:[id number] DOB: 06/26/1971 DOA: 09/12/2013  Referring physician: ED PCP: Default, Provider, MD   Chief Complaint: Knee pain  HPI: [name] [name] is a 42 y.o. male who presents to the ED with c/o bilateral knee pain for the past day.  Woke up with it this morning and been worsening throughout the day.  Patient is non-ambulatory and in a wheelchair at baseline for many years with BLE feet contractures.  He is also in a nursing home.  Unable to really relate why he is non-ambulatory.  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  Review of Systems: 12 systems reviewed and otherwise negative.  Past Medical History  Diagnosis Date  . Chronic pain   . Anxiety   . Schizoaffective disorder   . Hearing loss   . Seizure   . Arthritis   . Asthma   . Foot drop   . Anemia   . Dehydration   . Hypotension   . GERD (gastroesophageal reflux disease)   . Hypothyroidism   . Constipation   . Polysubstance abuse   . Cancer    History reviewed. No pertinent past surgical history. Social History:  reports that he has been smoking Cigarettes.  He has a 40.5 pack-year smoking history. He has never used smokeless tobacco. He reports that he does not drink alcohol or use illicit drugs.   Allergies  Allergen Reactions  . Fexofenadine Hcl Other (See Comments)    unknown  . Percocet [Oxycodone-Acetaminophen] Other (See Comments)    unknown  . Thorazine [Chlorpromazine Hcl] Other (See Comments)    unknown    History reviewed. No pertinent family history.  Prior to Admission medications   Medication Sig Start Date End Date Taking? Authorizing Provider  ALPRAZolam (XANAX) 0.5 MG tablet Take 1 tablet (0.5 mg total) by mouth daily at 12 noon. 07/21/13  Yes Renae Fickle, MD  benztropine (COGENTIN) 0.5 MG tablet Take 0.5 mg by mouth 2  (two) times daily. Given 2 hours after thyroid medication   Yes Historical Provider, MD  doxepin (SINEQUAN) 50 MG capsule Take 50 mg by mouth at bedtime.   Yes Historical Provider, MD  ferrous sulfate 325 (65 FE) MG tablet Take 325 mg by mouth daily with breakfast.   Yes Historical Provider, MD  FLUoxetine (PROZAC) 10 MG capsule Take 10 mg by mouth daily.   Yes Historical Provider, MD  FLUoxetine (PROZAC) 20 MG capsule Take 20 mg by mouth daily. With 10 mg to = 30 mg   Yes Historical Provider, MD  Fluticasone-Salmeterol (ADVAIR) 100-50 MCG/DOSE AEPB Inhale 1 puff into the lungs 2 (two) times daily.   Yes Historical Provider, MD  ipratropium-albuterol (DUONEB) 0.5-2.5 (3) MG/3ML SOLN Take 3 mLs by nebulization 3 (three) times daily. 07/21/13  Yes Renae Fickle, MD  levothyroxine (SYNTHROID, LEVOTHROID) 75 MCG tablet Take 75 mcg by mouth daily.   Yes Historical Provider, MD  loratadine (CLARITIN) 10 MG tablet Take 10 mg by mouth daily.   Yes Historical Provider, MD  mirtazapine (REMERON) 45 MG tablet Take 22.5 mg by mouth at bedtime.   Yes Historical Provider, MD  Nutritional Supplements (ENSURE PO) Take 1 Can by mouth 3 (three) times daily with meals.   Yes Historical Provider, MD  OLANZapine (ZYPREXA) 15 MG tablet Take 15 mg by mouth at bedtime.   Yes Historical Provider, MD  omeprazole (PRILOSEC) 40 MG capsule Take 40 mg by mouth daily.    Yes Historical Provider, MD  phenytoin (DILANTIN) 100 MG ER capsule Take 300 mg by mouth at bedtime.   Yes Historical Provider, MD  polyethylene glycol powder (GLYCOLAX/MIRALAX) powder Take 17 g by mouth daily.   Yes Historical Provider, MD  simvastatin (ZOCOR) 10 MG tablet Take 10 mg by mouth at bedtime.   Yes Historical Provider, MD  solifenacin (VESICARE) 5 MG tablet Take 5 mg by mouth daily.    Yes Historical Provider, MD  sucralfate (CARAFATE) 1 G tablet Take 1 g by mouth 4 (four) times daily.   Yes Historical Provider, MD  zolpidem (AMBIEN) 5 MG tablet  Take 1 tablet (5 mg total) by mouth at bedtime. 07/21/13  Yes Renae Fickle, MD  acetaminophen (TYLENOL) 325 MG tablet Take 650 mg by mouth every 6 (six) hours as needed. Pain    Historical Provider, MD  guaifenesin (ROBITUSSIN) 100 MG/5ML syrup Take 200 mg by mouth every 4 (four) hours as needed for cough.    Historical Provider, MD  ondansetron (ZOFRAN ODT) 4 MG disintegrating tablet Take 1 tablet (4 mg total) by mouth every 8 (eight) hours as needed for nausea. 03/13/13   [name], MD  sucralfate (CARAFATE) 1 G tablet Take 1 g by mouth 4 (four) times daily. 06/03/12 07/17/13  Olivia Mackie, MD  traMADol (ULTRAM) 50 MG tablet Take 50 mg by mouth every 6 (six) hours as needed for pain (pain).    Historical Provider, MD   Physical Exam: Filed Vitals:   09/12/13 2040  BP: 121/71  Pulse: 108  Temp: 98.7 F (37.1 C)  Resp: 20    General:  NAD, resting comfortably in bed Eyes: PEERLA EOMI ENT: mucous membranes moist Neck: supple w/o JVD Cardiovascular: RRR w/o MRG Respiratory: CTA B Abdomen: soft, nt, nd, bs+ Skin: no rash nor lesion Musculoskeletal: MAE Psychiatric: normal tone and affect Neurologic: AAOx3, grossly non-focal  Labs on Admission:  Basic Metabolic Panel:  Recent Labs Lab 09/12/13 1640  NA 137  K 3.7  CL 97  CO2 29  GLUCOSE 88  BUN 6  CREATININE 0.86  CALCIUM 9.2  PHOS 1.5*   Liver Function Tests:  Recent Labs Lab 09/12/13 1640  AST 31  ALT 68*  ALKPHOS 75  BILITOT 0.3  PROT 6.8  ALBUMIN 3.3*   No results found for this basename: LIPASE, AMYLASE,  in the last 168 hours No results found for this basename: AMMONIA,  in the last 168 hours CBC:  Recent Labs Lab 09/12/13 1640  WBC 16.3*  HGB 12.4*  HCT 38.1*  MCV 92.0  PLT 371   Cardiac Enzymes:  Recent Labs Lab 09/12/13 1640  CKTOTAL 221    BNP (last 3 results)  Recent Labs  07/17/13 2048 07/19/13 0425  PROBNP 730.8* 1041.0*   CBG: No results found for this basename:  GLUCAP,  in the last 168 hours  Radiological Exams on Admission: Dg Chest 2 View  09/12/2013   CLINICAL DATA:  Short of breath  EXAM: CHEST  2 VIEW  COMPARISON:  07/17/2013  FINDINGS: Normal heart size. Increased linear markings with fibrosis throughout both lungs are stable. No definite new consolidation. No pneumothorax. No pleural effusion. Hyperaeration and bronchitic changes are noted.  IMPRESSION: Chronic changes.  No active cardiopulmonary disease.   Electronically Signed   By: Maryclare Bean M.D.   On: 09/12/2013 19:46   Dg Knee Complete 4 Views  Left  09/12/2013   CLINICAL DATA:  Knee pain.  EXAM: LEFT KNEE - COMPLETE 4+ VIEW  COMPARISON:  None.  FINDINGS: Patchy diffuse osteopenia with regions of bone sclerosis noted. A ametabolic process including osteomalacia or renal osteodystrophy may be present. Anemia or malignancy may be present. Osteoporosis may be present. No evidence of fracture or dislocation. Sclerotic densities in of the proximal tibia and distal femur may represent infarcts.  IMPRESSION: No acute abnormality. Abnormal bony mineralization as described above. Metabolic and hematologic workup may prove useful.   Electronically Signed   By: Maisie Fus  Register   On: 09/12/2013 17:24   Dg Knee Complete 4 Views Right  09/12/2013   CLINICAL DATA:  Bilateral knee pain without trauma. History of COPD. Polysubstance abuse. Shortness of breath.  EXAM: RIGHT KNEE - COMPLETE 4+ VIEW  COMPARISON:  None.  FINDINGS: Heterogeneous osseous density. Underlying osteopenia. No acute fracture or dislocation. No joint effusion. Increased density within the femoral condyles, suspicious for areas of avascular necrosis.  IMPRESSION: Nonspecific heterogeneous osseous density. 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 As described on tibia/fibula films, metabolic workup should be considered. Metastatic disease or other marrow replacing process cannot be excluded.   Electronically Signed    By: Jeronimo Greaves M.D.   On: 09/12/2013 17:44    EKG: Independently reviewed.  Assessment/Plan Active Problems:   Leukocytosis, unspecified   Fever   Knee pain, bilateral   Leukocytosis and fever - unclear source of the patients SIRS, not frankly septic at this point and given lack of source will hold off on emperic antibiotics at this time.  Cultures are pending.  Orthopaedic surgery has aspirated patients right knee here in the ED and it did not appear to have infected appearing joint fluid (culture and cell count pending).  (See Dr. Ranell Patrick' note).  Knee pain bilateral - patient does have very abnormal appearing bones on XRay of his knees bilaterally.  Initiating work up with MRI of knees, PTH and Vit D.  Sclerotic lesions are concerning.  No protein in urine and patients young age of 75 makes a myeloproliferative disorder less likely.  May need bone marrow biopsy if the rest of evaluation is negative.    Code Status: Full (must indicate code status--if unknown or must be presumed, indicate so) Family Communication: No family in room (indicate person spoken with, if applicable, with phone number if by telephone) Disposition Plan: Admit to obs (indicate anticipated LOS)  Time spent: 70 min  GARDNER, JARED M. [facility] Pager [phone]  If 7PM-7AM, please contact night-coverage [email or web address] Password [facility] 09/12/2013, 8:54 PM

## 2013-09-13 DIAGNOSIS — J441 Chronic obstructive pulmonary disease with (acute) exacerbation: Secondary | ICD-10-CM

## 2013-09-13 DIAGNOSIS — K219 Gastro-esophageal reflux disease without esophagitis: Secondary | ICD-10-CM

## 2013-09-13 DIAGNOSIS — E039 Hypothyroidism, unspecified: Secondary | ICD-10-CM

## 2013-09-13 LAB — CBC
HCT: 33.7 % — ABNORMAL LOW (ref 39.0–52.0)
Hemoglobin: 10.8 g/dL — ABNORMAL LOW (ref 13.0–17.0)
MCH: 29.8 pg (ref 26.0–34.0)
MCV: 93.1 fL (ref 78.0–100.0)
RDW: 13.8 % (ref 11.5–15.5)
WBC: 11.1 10*3/uL — ABNORMAL HIGH (ref 4.0–10.5)

## 2013-09-13 LAB — BASIC METABOLIC PANEL
BUN: 14 mg/dL (ref 6–23)
CO2: 27 mEq/L (ref 19–32)
Calcium: 8.3 mg/dL — ABNORMAL LOW (ref 8.4–10.5)
Chloride: 103 mEq/L (ref 96–112)
Creatinine, Ser: 1.22 mg/dL (ref 0.50–1.35)
Glucose, Bld: 87 mg/dL (ref 70–99)
Potassium: 4.4 mEq/L (ref 3.5–5.1)

## 2013-09-13 LAB — SYNOVIAL CELL COUNT + DIFF, W/ CRYSTALS
Crystals, Fluid: NONE SEEN
Lymphocytes-Synovial Fld: 3 % (ref 0–20)
Monocyte-Macrophage-Synovial Fluid: 95 % — ABNORMAL HIGH (ref 50–90)
WBC, Synovial: 137 /mm3 (ref 0–200)

## 2013-09-13 LAB — VITAMIN D 25 HYDROXY (VIT D DEFICIENCY, FRACTURES): Vit D, 25-Hydroxy: 18 ng/mL — ABNORMAL LOW (ref 30–89)

## 2013-09-13 LAB — PARATHYROID HORMONE, INTACT (NO CA): PTH: 25.1 pg/mL (ref 14.0–72.0)

## 2013-09-13 MED ORDER — AMOXICILLIN-POT CLAVULANATE 875-125 MG PO TABS: 1.0000 | ORAL_TABLET | Freq: Two times a day (BID) | ORAL | Status: DC

## 2013-09-13 NOTE — Progress Notes (Signed)
   Subjective:     Pt sitting up in bed somewhat somnolent currently Mild pain to bilateral knees MRIs completed to both knees  Patient reports pain as mild.  Objective:   VITALS:   Filed Vitals:   09/13/13 0656  BP: 121/82  Pulse: 105  Temp: 98.7 F (37.1 C)  Resp: 20    No effusion or erythema Cap refill less than 2 seconds No signs of acute infection  LABS  Recent Labs  09/12/13 1640 09/13/13 0527  HGB 12.4* 10.8*  HCT 38.1* 33.7*  WBC 16.3* 11.1*  PLT 371 331     Recent Labs  09/12/13 1640 09/13/13 0527  NA 137 139  K 3.7 4.4  BUN 6 14  CREATININE 0.86 1.22  GLUCOSE 88 87   MRI:  Bilateral knees with multiple bone infarcts with mild myositis No ligament abnormalities  Assessment/Plan:    Leukocytosis improved today Mild pain currently  No signs of septic knee joints Agree with continued workup by medical team Will continue to monitor his progress Pain control as needed     Alphonsa Overall, MPAS, PA-C  09/13/2013, 9:14 AM

## 2013-09-13 NOTE — Progress Notes (Signed)
Clinical Social Work  CSW faxed DC summary and FL2 to [facility] and spoke with Lurena Joiner who is agreeable to accept patient back today. CSW informed patient of DC and patient requests [facility] to transport back to ALF. Patient is aware of no guarantee of payment. CSW prepared DC packet and was informed by Lurena Joiner at ALF that no script for Xanax needed since only at hospital for less than a day. CSW coordinated transportation via [facility]. Request #: [id number].  CSW is signing off but available if needed.  [place], [place] [phone]

## 2013-09-13 NOTE — Progress Notes (Signed)
UR completed 

## 2013-09-13 NOTE — Progress Notes (Signed)
Nutrition Brief Note  Malnutrition Screening Tool result is inaccurate.  Pt currently on a heart healthy diet eating multiple meals and reports excellent appetite PTA. Please consult if nutrition needs are identified.  Levon Hedger MS, RD, LDN [phone] Pager [phone] After Hours Pager

## 2013-09-13 NOTE — Care Management Note (Signed)
    Page 1 of 1   09/13/2013     3:28:11 PM   CARE MANAGEMENT NOTE 09/13/2013  Patient:  [name] [name],[name] [name]   Account Number:  [id number]  Date Initiated:  09/13/2013  Documentation initiated by:  Colleen Can  Subjective/Objective Assessment:   dx bilateral knee pain, leukocytoss     Action/Plan:   Plans are for patient to return to [facility] -[facility]F.   Anticipated DC Date:  09/13/2013   Anticipated DC Plan:  ASSISTED LIVING / REST HOME  In-house referral  Clinical Social Worker      DC Planning Services  CM consult      Choice offered to / List presented to:             Status of service:  Completed, signed off Medicare Important Message given?   (If response is "NO", the following Medicare IM given date fields will be blank) Date Medicare IM given:   Date Additional Medicare IM given:    Discharge Disposition:  ASSISTED LIVING  Per UR Regulation:    If discussed at Long Length of Stay Meetings, dates discussed:    Comments:

## 2013-09-13 NOTE — Progress Notes (Signed)
Clinical Social Work Department BRIEF PSYCHOSOCIAL ASSESSMENT 09/13/2013  Patient:  [name] [name],[name] [name]     Account Number:  [id number]     Admit date:  09/12/2013  Clinical Social Worker:  Dennison Bulla  Date/Time:  09/13/2013 09:45 AM  Referred by:  Physician  Date Referred:  09/13/2013 Referred for  ALF Placement   Other Referral:   Interview type:  Patient Other interview type:    PSYCHOSOCIAL DATA Living Status:  FACILITY Admitted from facility:  [facility] Level of care:  Assisted Living Primary support name:  Darreld Mclean Primary support relationship to patient:  SPOUSE Degree of support available:   Adequate    CURRENT CONCERNS Current Concerns  Post-Acute Placement   Other Concerns:    SOCIAL WORK ASSESSMENT / PLAN CSW received referral during progression meeting. Patient is from ALF and MD reports that patient is ready to DC today.    CSW reviewed chart and met with patient at bedside. CSW introduced myself and explained role. Patient reports he lives at [facility] with wife. Patient would like to return to ALF at DC. CSW explained CSW role and assistance with patient returning to ALF.    CSW completed FL2 and spoke with ALF. ALF has to review DC summary and FL2 prior to patient returning. CSW will continue to follow and will fax information once MD completes paperwork.   Assessment/plan status:  Psychosocial Support/Ongoing Assessment of Needs Other assessment/ plan:   Information/referral to community resources:   Will return to ALF    PATIENT'S/FAMILY'S RESPONSE TO PLAN OF CARE: Patient alert and oriented but very HOH. Patient agreeable to return to ALF and reports he is happy that he can go back to see wife today. Patient agreeable for CSW to assist with DC planning.       [place], [place] [phone]

## 2013-09-13 NOTE — Discharge Summary (Addendum)
Physician Discharge Summary  [name] [name] HKV:[id number] DOB: 04/15/1971 DOA: 09/12/2013  PCP: Default, Provider, MD  Admit date: 09/12/2013 Discharge date: 09/13/2013  Time spent: > 35 minutes  Recommendations for Outpatient Follow-up:  1. Should patient develop any fevers please fill script and have patient follow up with primary care physician. 2. F/u with Dr. Arthur Holms in 1 week  Discharge Diagnoses:  Active Problems:   Leukocytosis, unspecified   Fever   Knee pain, bilateral   Discharge Condition: Stable  Diet recommendation: Low sodium heart heathly  There were no vitals filed for this visit.  History of present illness:  Patient is a 42 year old Caucasian male who is non-ambulatory at baseline with bilateral lower extremity feet contractures, history of seizure disorder, history chronic pain, hypothyroidism, and history of polysubstance abuse who presented to the ED complaining of bilateral knee pain.  Hospital Course:  1. Fever - Resolved, no fever within the last 24 hours - No source of infection identified. - Will discharge with prescription for Augmentin. Should patient develop any fevers they are to fill the prescription and give to the patient as recommended and have him followup with his primary care physician.  2. bilateral knee pain - Orthopedic surgeon consulted. - Arthrocentesis performed, culture reports no - Will recommend patient continue to get this worked up as an outpatient - Recommend following up with orthopedic surgeon Dr. Ranell Patrick  ADDendum 3. Leukocytosis - resolving without any antibiotics on board and most likely due to stress margination - Will provide prescription for patient to fill should he develop any fevers after discharge   Procedures:  Arthrocentesis  Consultations:  Orthopedic surgery: Dr. Ranell Patrick  Discharge Exam: Filed Vitals:   09/13/13 0656  BP: 121/82  Pulse: 105  Temp: 98.7 F (37.1 C)  Resp: 20    General: Pt  in NAD, Alert and Awake Cardiovascular: RRR, no MRG Respiratory: CTA BL, no wheezes  Discharge Instructions  Discharge Orders   Future Orders Complete By Expires   Call MD for:  difficulty breathing, headache or visual disturbances  As directed    Call MD for:  temperature >100.4  As directed    Diet - low sodium heart healthy  As directed    Discharge instructions  As directed    Comments:     Please fill antibiotic script and take if you develop any fevers after discharge.   Increase activity slowly  As directed        Medication List    TAKE these medications       acetaminophen 325 MG tablet  Commonly known as:  TYLENOL  Take 650 mg by mouth every 6 (six) hours as needed. Pain     ALPRAZolam 0.5 MG tablet  Commonly known as:  XANAX  Take 1 tablet (0.5 mg total) by mouth daily at 12 noon.     amoxicillin-clavulanate 875-125 MG per tablet  Commonly known as:  AUGMENTIN  Take 1 tablet by mouth 2 (two) times daily.     benztropine 0.5 MG tablet  Commonly known as:  COGENTIN  Take 0.5 mg by mouth 2 (two) times daily. Given 2 hours after thyroid medication     doxepin 50 MG capsule  Commonly known as:  SINEQUAN  Take 50 mg by mouth at bedtime.     ENSURE PO  Take 1 Can by mouth 3 (three) times daily with meals.     ferrous sulfate 325 (65 FE) MG tablet  Take 325 mg by mouth daily  with breakfast.     FLUoxetine 20 MG capsule  Commonly known as:  PROZAC  Take 20 mg by mouth daily. With 10 mg to = 30 mg     FLUoxetine 10 MG capsule  Commonly known as:  PROZAC  Take 10 mg by mouth daily.     Fluticasone-Salmeterol 100-50 MCG/DOSE Aepb  Commonly known as:  ADVAIR  Inhale 1 puff into the lungs 2 (two) times daily.     guaifenesin 100 MG/5ML syrup  Commonly known as:  ROBITUSSIN  Take 200 mg by mouth every 4 (four) hours as needed for cough.     ipratropium-albuterol 0.5-2.5 (3) MG/3ML Soln  Commonly known as:  DUONEB  Take 3 mLs by nebulization 3 (three)  times daily.     levothyroxine 75 MCG tablet  Commonly known as:  SYNTHROID, LEVOTHROID  Take 75 mcg by mouth daily.     loratadine 10 MG tablet  Commonly known as:  CLARITIN  Take 10 mg by mouth daily.     mirtazapine 45 MG tablet  Commonly known as:  REMERON  Take 22.5 mg by mouth at bedtime.     OLANZapine 15 MG tablet  Commonly known as:  ZYPREXA  Take 15 mg by mouth at bedtime.     omeprazole 40 MG capsule  Commonly known as:  PRILOSEC  Take 40 mg by mouth daily.     ondansetron 4 MG disintegrating tablet  Commonly known as:  ZOFRAN ODT  Take 1 tablet (4 mg total) by mouth every 8 (eight) hours as needed for nausea.     phenytoin 100 MG ER capsule  Commonly known as:  DILANTIN  Take 300 mg by mouth at bedtime.     polyethylene glycol powder powder  Commonly known as:  GLYCOLAX/MIRALAX  Take 17 g by mouth daily.     simvastatin 10 MG tablet  Commonly known as:  ZOCOR  Take 10 mg by mouth at bedtime.     solifenacin 5 MG tablet  Commonly known as:  VESICARE  Take 5 mg by mouth daily.     traMADol 50 MG tablet  Commonly known as:  ULTRAM  Take 50 mg by mouth every 6 (six) hours as needed for pain (pain).     zolpidem 5 MG tablet  Commonly known as:  AMBIEN  Take 1 tablet (5 mg total) by mouth at bedtime.      ASK your doctor about these medications       sucralfate 1 G tablet  Commonly known as:  CARAFATE  Take 1 g by mouth 4 (four) times daily.  Ask about: Which instructions should I use?     sucralfate 1 G tablet  Commonly known as:  CARAFATE  Take 1 g by mouth 4 (four) times daily.  Ask about: Which instructions should I use?       Allergies  Allergen Reactions  . Fexofenadine Hcl Other (See Comments)    unknown  . Percocet [Oxycodone-Acetaminophen] Other (See Comments)    unknown  . Thorazine [Chlorpromazine Hcl] Other (See Comments)    unknown      The results of significant diagnostics from this hospitalization (including imaging,  microbiology, ancillary and laboratory) are listed below for reference.    Significant Diagnostic Studies: Dg Chest 2 View  09/12/2013   CLINICAL DATA:  Short of breath  EXAM: CHEST  2 VIEW  COMPARISON:  07/17/2013  FINDINGS: Normal heart size. Increased linear markings with fibrosis throughout both  lungs are stable. No definite new consolidation. No pneumothorax. No pleural effusion. Hyperaeration and bronchitic changes are noted.  IMPRESSION: Chronic changes.  No active cardiopulmonary disease.   Electronically Signed   By: Maryclare Bean M.D.   On: 09/12/2013 19:46   Mr Knee Right Wo Contrast  09/13/2013   EXAM: MRI OF THE RIGHT KNEE WITHOUT CONTRAST  TECHNIQUE: Multiplanar, multisequence MR imaging of the knee was performed. No intravenous contrast was administered.  COMPARISON:  None.  FINDINGS: There are numerous bone infarcts accounting for the plain film abnormalities. These are remote in appearance without any significant surrounding marrow edema. No acute bony findings. No bone contusion, marrow edema or stress fracture. There is a moderate-sized joint effusion. No significant synovial thickening. Superior and medial patellar plica are noted. The cruciate and collateral ligaments are intact. No meniscal tears. There is mild edema like signal abnormality in the surrounding knee musculature. This is a nonspecific finding but could reflect myositis. No obvious muscle infarct or hematoma.  IMPRESSION:  1. Remote bone infarcts.  No acute bony findings. 2. Nonspecific myositis. 3. Intact cruciate and collateral ligaments and no meniscal tears. 4. Moderate-sized joint effusion with superior and medial patellar plica.   Electronically Signed   By: Loralie Champagne M.D.   On: 09/13/2013 08:12   Mr Knee Left  Wo Contrast  09/13/2013   CLINICAL DATA:  Abnormal x-rays and knee pain.  EXAM: MRI OF THE LEFT KNEE WITHOUT CONTRAST  TECHNIQUE: Multiplanar, multisequence MR imaging of the knee was performed. No  intravenous contrast was administered.  COMPARISON:  Radiographs 09/12/2013.  FINDINGS: There are numerous bone infarcts accounting for the plain film abnormalities. These are remote in appearance without any significant surrounding marrow edema. No acute bony findings. No bone contusion, marrow edema or stress fracture.  There is a moderate-sized joint effusion. No significant synovial thickening. Superior and medial patellar plica are noted.  The cruciate and collateral ligaments are intact. No meniscal tears.  There is mild edema like signal abnormality in the surrounding knee musculature. This is a nonspecific finding but could reflect myositis. No obvious muscle infarct or hematoma.  IMPRESSION:  1. Multiple remote appearing bone infarcts. No acute bony abnormality. 2. Nonspecific myositis. 3. Intact ligamentous structures and no meniscal tears. 4. Moderate-sized joint effusion with superior and medial patellar plica.   Electronically Signed   By: Loralie Champagne M.D.   On: 09/13/2013 08:10   Dg Knee Complete 4 Views Left  09/12/2013   CLINICAL DATA:  Knee pain.  EXAM: LEFT KNEE - COMPLETE 4+ VIEW  COMPARISON:  None.  FINDINGS: Patchy diffuse osteopenia with regions of bone sclerosis noted. A ametabolic process including osteomalacia or renal osteodystrophy may be present. Anemia or malignancy may be present. Osteoporosis may be present. No evidence of fracture or dislocation. Sclerotic densities in of the proximal tibia and distal femur may represent infarcts.  IMPRESSION: No acute abnormality. Abnormal bony mineralization as described above. Metabolic and hematologic workup may prove useful.   Electronically Signed   By: Maisie Fus  Register   On: 09/12/2013 17:24   Dg Knee Complete 4 Views Right  09/12/2013   CLINICAL DATA:  Bilateral knee pain without trauma. History of COPD. Polysubstance abuse. Shortness of breath.  EXAM: RIGHT KNEE - COMPLETE 4+ VIEW  COMPARISON:  None.  FINDINGS: Heterogeneous  osseous density. Underlying osteopenia. No acute fracture or dislocation. No joint effusion. Increased density within the femoral condyles, suspicious for areas of avascular necrosis.  IMPRESSION:  Nonspecific heterogeneous osseous density. 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 As described on tibia/fibula films, metabolic workup should be considered. Metastatic disease or other marrow replacing process cannot be excluded.   Electronically Signed   By: Jeronimo Greaves M.D.   On: 09/12/2013 17:44    Microbiology: Recent Results (from the past 240 hour(s))  BODY FLUID CULTURE     Status: None   Collection Time    09/12/13  8:20 PM      Result Value Range Status   Specimen Description SYNOVIAL KNEE   Final   Special Requests Normal   Final   Gram Stain     Final   Value: RARE WBC PRESENT,BOTH PMN AND MONONUCLEAR     NO ORGANISMS SEEN     Performed at [facility]   Culture     Final   Value: NO GROWTH     Performed at [facility]   Report Status PENDING   Incomplete  MRSA PCR SCREENING     Status: Abnormal   Collection Time    09/12/13  9:20 PM      Result Value Range Status   MRSA by PCR POSITIVE (*) NEGATIVE Final   Comment:            The GeneXpert MRSA Assay (FDA     approved for NASAL specimens     only), is one component of a     comprehensive MRSA colonization     surveillance program. It is not     intended to diagnose MRSA     infection nor to guide or     monitor treatment for     MRSA infections.     RESULT CALLED TO, READ BACK BY AND VERIFIED WITH:     KALLAM,M RN @2330  ON 11.30.2014 BY MCREYNOLDS,B     Labs: Basic Metabolic Panel:  Recent Labs Lab 09/12/13 1640 09/13/13 0527  NA 137 139  K 3.7 4.4  CL 97 103  CO2 29 27  GLUCOSE 88 87  BUN 6 14  CREATININE 0.86 1.22  CALCIUM 9.2 8.3*  PHOS 1.5*  --    Liver Function Tests:  Recent Labs Lab 09/12/13 1640  AST 31  ALT 68*  ALKPHOS 75  BILITOT 0.3  PROT  6.8  ALBUMIN 3.3*   No results found for this basename: LIPASE, AMYLASE,  in the last 168 hours No results found for this basename: AMMONIA,  in the last 168 hours CBC:  Recent Labs Lab 09/12/13 1640 09/13/13 0527  WBC 16.3* 11.1*  HGB 12.4* 10.8*  HCT 38.1* 33.7*  MCV 92.0 93.1  PLT 371 331   Cardiac Enzymes:  Recent Labs Lab 09/12/13 1640  CKTOTAL 221   BNP: BNP (last 3 results)  Recent Labs  07/17/13 2048 07/19/13 0425  PROBNP 730.8* 1041.0*   CBG: No results found for this basename: GLUCAP,  in the last 168 hours     Signed:  [name]  [facility] 09/13/2013, 12:28 PM

## 2013-09-14 LAB — URINE CULTURE: Colony Count: NO GROWTH

## 2013-09-15 LAB — VITAMIN D 1,25 DIHYDROXY: Vitamin D2 1, 25 (OH)2: <8 pg/mL

## 2013-09-16 LAB — BODY FLUID CULTURE
Culture: NO GROWTH
Special Requests: NORMAL

## 2013-09-19 LAB — CULTURE, BLOOD (ROUTINE X 2)
Culture: NO GROWTH
Culture: NO GROWTH

## 2013-10-12 IMAGING — CR DG CHEST 2V
2 series · 2 of 2 positions shown · non-contrast
Comparison: 03/01/2013

CLINICAL DATA: Chest pain

CHEST - 2 VIEW

[w chest lat]
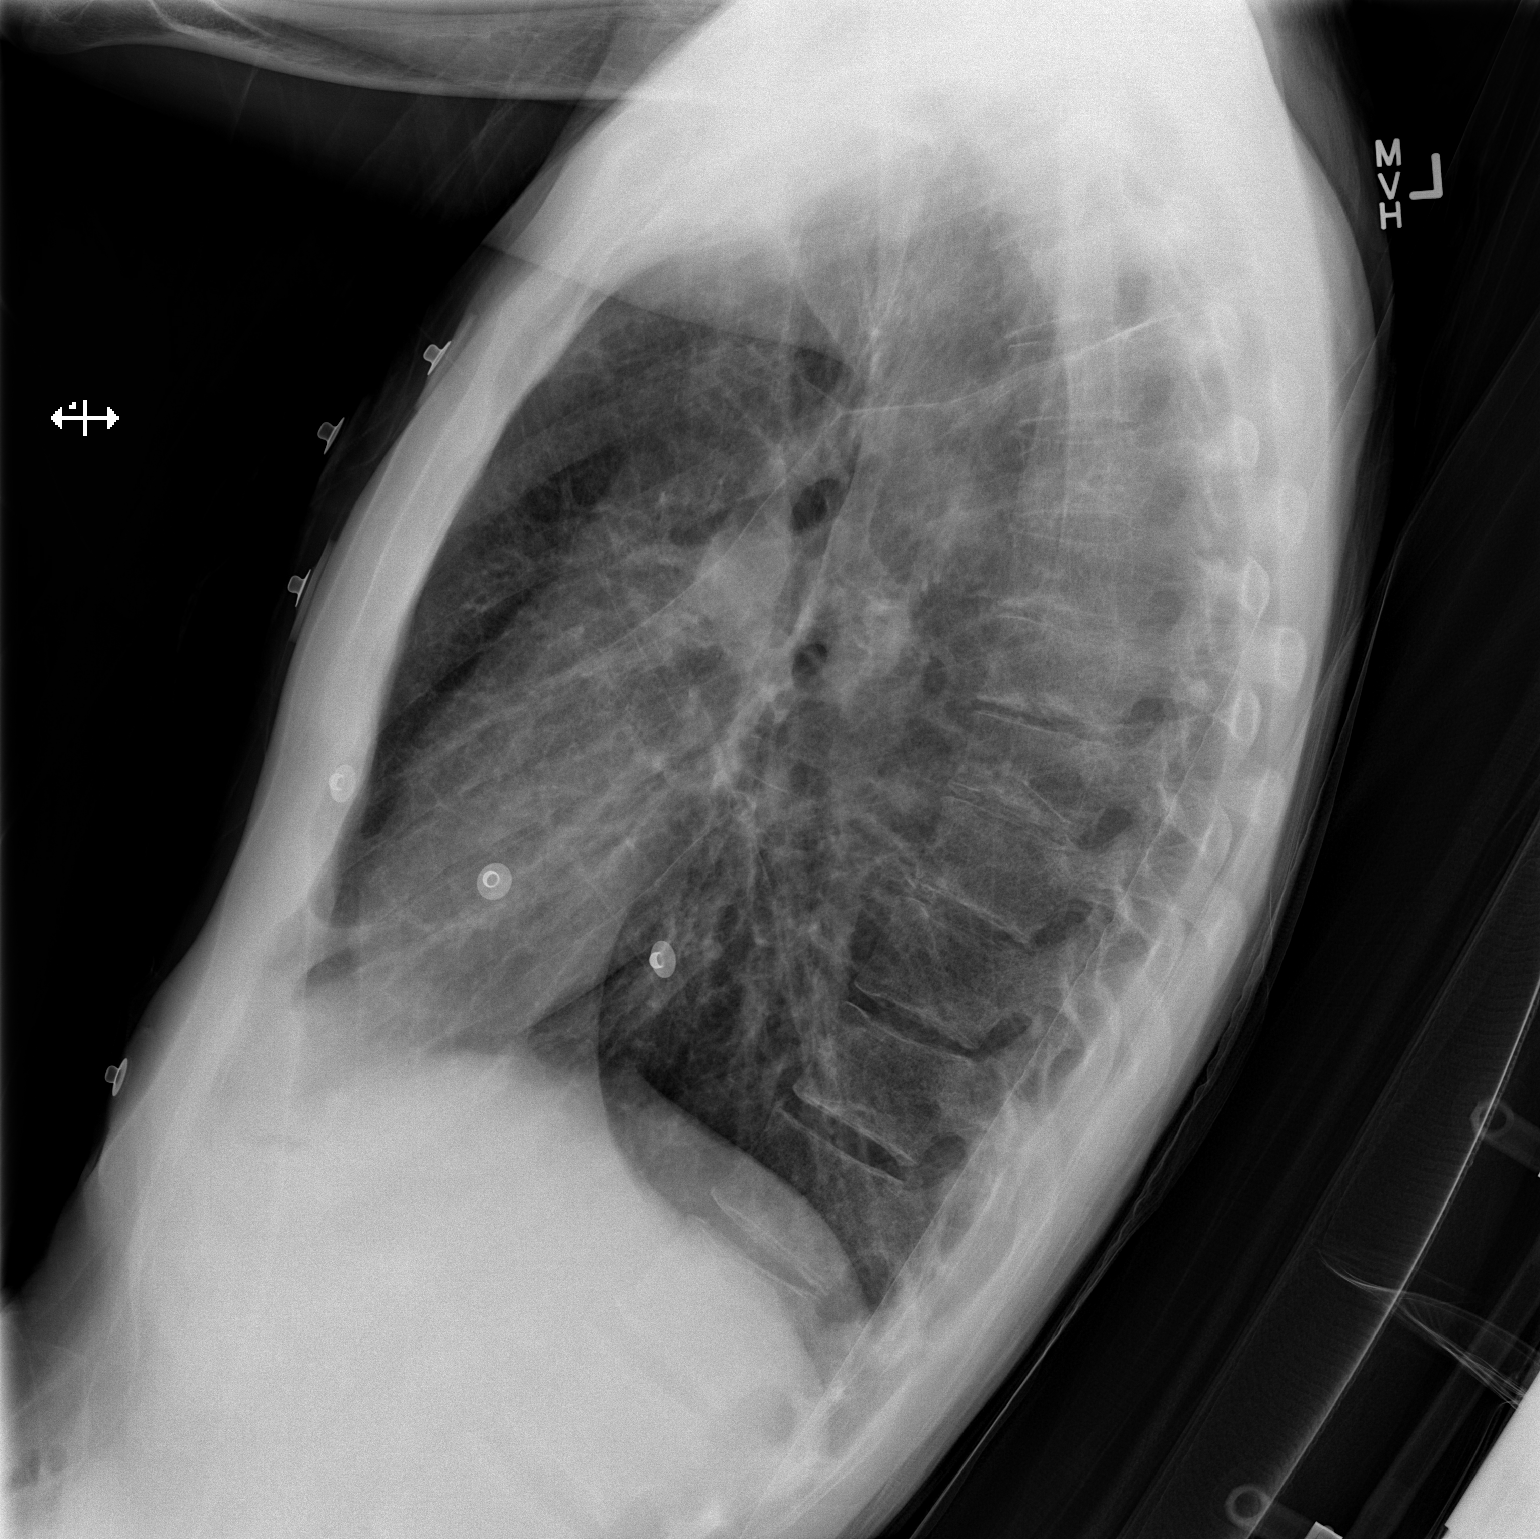

[x chest ap]
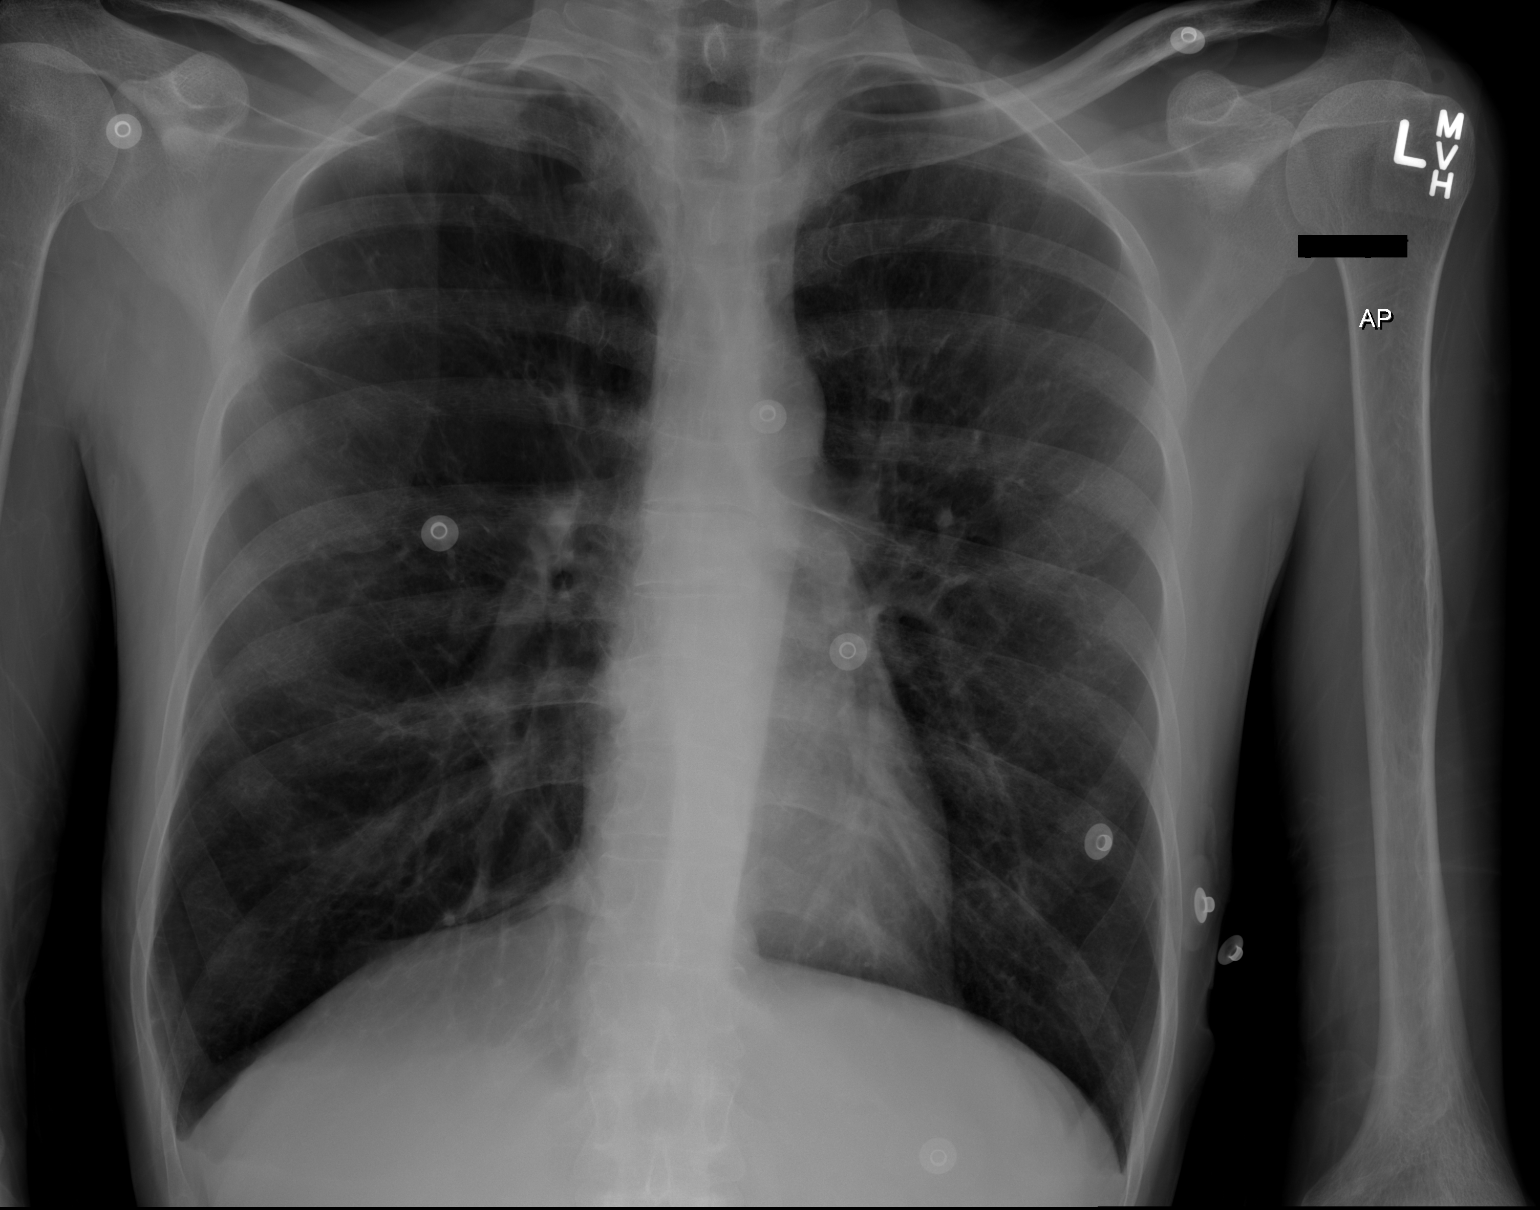

[2 of 2 positions shown; findings below may reference images not displayed]

FINDINGS: The heart and pulmonary vascularity are within normal
limits.  Mild interstitial changes are again identified as well as
some mild scarring.  No focal infiltrate or sizable effusion is
seen.  The osseous structures are within normal limits.
IMPRESSION: Chronic changes without acute abnormality.  No change from prior
exam.

## 2013-10-25 ENCOUNTER — Other Ambulatory Visit: Payer: Self-pay | Admitting: Otolaryngology

## 2013-10-25 DIAGNOSIS — H60399 Other infective otitis externa, unspecified ear: Secondary | ICD-10-CM

## 2013-10-25 DIAGNOSIS — H7100 Cholesteatoma of attic, unspecified ear: Secondary | ICD-10-CM

## 2013-10-25 DIAGNOSIS — H9190 Unspecified hearing loss, unspecified ear: Secondary | ICD-10-CM

## 2013-10-29 ENCOUNTER — Ambulatory Visit
Admission: RE | Admit: 2013-10-29 | Discharge: 2013-10-29 | Disposition: A | Discharge status: Home / Self Care | Payer: Medicare Other | Source: Ambulatory Visit | Attending: Otolaryngology | Admitting: Otolaryngology

## 2013-10-29 DIAGNOSIS — H7100 Cholesteatoma of attic, unspecified ear: Secondary | ICD-10-CM

## 2013-10-29 DIAGNOSIS — H60399 Other infective otitis externa, unspecified ear: Secondary | ICD-10-CM

## 2013-10-29 DIAGNOSIS — H9190 Unspecified hearing loss, unspecified ear: Secondary | ICD-10-CM

## 2014-01-08 ENCOUNTER — Encounter (HOSPITAL_COMMUNITY): Payer: Self-pay | Admitting: Emergency Medicine

## 2014-01-08 ENCOUNTER — Inpatient Hospital Stay (HOSPITAL_COMMUNITY)
Admission: EM | Admit: 2014-01-08 | Discharge: 2014-02-11 | DRG: 003 | Disposition: E | Payer: Medicare Other | Source: Skilled Nursing Facility | Attending: Internal Medicine | Admitting: Internal Medicine

## 2014-01-08 DIAGNOSIS — R64 Cachexia: Secondary | ICD-10-CM

## 2014-01-08 DIAGNOSIS — G9340 Encephalopathy, unspecified: Secondary | ICD-10-CM | POA: Diagnosis present

## 2014-01-08 DIAGNOSIS — Y836 Removal of other organ (partial) (total) as the cause of abnormal reaction of the patient, or of later complication, without mention of misadventure at the time of the procedure: Secondary | ICD-10-CM | POA: Diagnosis not present

## 2014-01-08 DIAGNOSIS — Z515 Encounter for palliative care: Secondary | ICD-10-CM

## 2014-01-08 DIAGNOSIS — I824Y9 Acute embolism and thrombosis of unspecified deep veins of unspecified proximal lower extremity: Secondary | ICD-10-CM | POA: Diagnosis not present

## 2014-01-08 DIAGNOSIS — I959 Hypotension, unspecified: Secondary | ICD-10-CM

## 2014-01-08 DIAGNOSIS — E876 Hypokalemia: Secondary | ICD-10-CM | POA: Diagnosis not present

## 2014-01-08 DIAGNOSIS — J441 Chronic obstructive pulmonary disease with (acute) exacerbation: Secondary | ICD-10-CM

## 2014-01-08 DIAGNOSIS — Z993 Dependence on wheelchair: Secondary | ICD-10-CM

## 2014-01-08 DIAGNOSIS — A419 Sepsis, unspecified organism: Secondary | ICD-10-CM

## 2014-01-08 DIAGNOSIS — Z79899 Other long term (current) drug therapy: Secondary | ICD-10-CM

## 2014-01-08 DIAGNOSIS — J438 Other emphysema: Secondary | ICD-10-CM | POA: Diagnosis present

## 2014-01-08 DIAGNOSIS — D649 Anemia, unspecified: Secondary | ICD-10-CM

## 2014-01-08 DIAGNOSIS — D6959 Other secondary thrombocytopenia: Secondary | ICD-10-CM | POA: Diagnosis not present

## 2014-01-08 DIAGNOSIS — E43 Unspecified severe protein-calorie malnutrition: Secondary | ICD-10-CM

## 2014-01-08 DIAGNOSIS — E861 Hypovolemia: Secondary | ICD-10-CM | POA: Diagnosis not present

## 2014-01-08 DIAGNOSIS — J9601 Acute respiratory failure with hypoxia: Secondary | ICD-10-CM

## 2014-01-08 DIAGNOSIS — F259 Schizoaffective disorder, unspecified: Secondary | ICD-10-CM | POA: Diagnosis present

## 2014-01-08 DIAGNOSIS — D684 Acquired coagulation factor deficiency: Secondary | ICD-10-CM | POA: Diagnosis not present

## 2014-01-08 DIAGNOSIS — Z681 Body mass index (BMI) 19 or less, adult: Secondary | ICD-10-CM

## 2014-01-08 DIAGNOSIS — R6521 Severe sepsis with septic shock: Secondary | ICD-10-CM

## 2014-01-08 DIAGNOSIS — E874 Mixed disorder of acid-base balance: Secondary | ICD-10-CM | POA: Diagnosis not present

## 2014-01-08 DIAGNOSIS — K55059 Acute (reversible) ischemia of intestine, part and extent unspecified: Secondary | ICD-10-CM | POA: Diagnosis not present

## 2014-01-08 DIAGNOSIS — R6520 Severe sepsis without septic shock: Secondary | ICD-10-CM

## 2014-01-08 DIAGNOSIS — J9600 Acute respiratory failure, unspecified whether with hypoxia or hypercapnia: Secondary | ICD-10-CM | POA: Diagnosis present

## 2014-01-08 DIAGNOSIS — R34 Anuria and oliguria: Secondary | ICD-10-CM | POA: Diagnosis not present

## 2014-01-08 DIAGNOSIS — N179 Acute kidney failure, unspecified: Secondary | ICD-10-CM

## 2014-01-08 DIAGNOSIS — K929 Disease of digestive system, unspecified: Secondary | ICD-10-CM | POA: Diagnosis not present

## 2014-01-08 DIAGNOSIS — A4102 Sepsis due to Methicillin resistant Staphylococcus aureus: Principal | ICD-10-CM | POA: Diagnosis present

## 2014-01-08 DIAGNOSIS — N170 Acute kidney failure with tubular necrosis: Secondary | ICD-10-CM | POA: Diagnosis not present

## 2014-01-08 DIAGNOSIS — D72829 Elevated white blood cell count, unspecified: Secondary | ICD-10-CM

## 2014-01-08 DIAGNOSIS — F17200 Nicotine dependence, unspecified, uncomplicated: Secondary | ICD-10-CM | POA: Diagnosis present

## 2014-01-08 DIAGNOSIS — R7989 Other specified abnormal findings of blood chemistry: Secondary | ICD-10-CM

## 2014-01-08 DIAGNOSIS — Z66 Do not resuscitate: Secondary | ICD-10-CM | POA: Diagnosis not present

## 2014-01-08 DIAGNOSIS — E162 Hypoglycemia, unspecified: Secondary | ICD-10-CM | POA: Diagnosis not present

## 2014-01-08 DIAGNOSIS — F1910 Other psychoactive substance abuse, uncomplicated: Secondary | ICD-10-CM | POA: Diagnosis present

## 2014-01-08 DIAGNOSIS — E039 Hypothyroidism, unspecified: Secondary | ICD-10-CM | POA: Diagnosis present

## 2014-01-08 DIAGNOSIS — E860 Dehydration: Secondary | ICD-10-CM

## 2014-01-08 DIAGNOSIS — J90 Pleural effusion, not elsewhere classified: Secondary | ICD-10-CM | POA: Diagnosis not present

## 2014-01-08 DIAGNOSIS — K560 Paralytic ileus: Secondary | ICD-10-CM | POA: Diagnosis not present

## 2014-01-08 DIAGNOSIS — G40909 Epilepsy, unspecified, not intractable, without status epilepticus: Secondary | ICD-10-CM | POA: Diagnosis present

## 2014-01-08 DIAGNOSIS — F411 Generalized anxiety disorder: Secondary | ICD-10-CM | POA: Diagnosis not present

## 2014-01-08 DIAGNOSIS — G8220 Paraplegia, unspecified: Secondary | ICD-10-CM | POA: Diagnosis present

## 2014-01-08 DIAGNOSIS — K859 Acute pancreatitis without necrosis or infection, unspecified: Secondary | ICD-10-CM | POA: Diagnosis not present

## 2014-01-08 DIAGNOSIS — K7200 Acute and subacute hepatic failure without coma: Secondary | ICD-10-CM | POA: Diagnosis not present

## 2014-01-08 DIAGNOSIS — J189 Pneumonia, unspecified organism: Secondary | ICD-10-CM

## 2014-01-08 DIAGNOSIS — H9190 Unspecified hearing loss, unspecified ear: Secondary | ICD-10-CM | POA: Diagnosis present

## 2014-01-08 DIAGNOSIS — E2749 Other adrenocortical insufficiency: Secondary | ICD-10-CM | POA: Diagnosis present

## 2014-01-08 DIAGNOSIS — K219 Gastro-esophageal reflux disease without esophagitis: Secondary | ICD-10-CM | POA: Diagnosis present

## 2014-01-08 DIAGNOSIS — G9389 Other specified disorders of brain: Secondary | ICD-10-CM | POA: Diagnosis present

## 2014-01-08 DIAGNOSIS — E8809 Other disorders of plasma-protein metabolism, not elsewhere classified: Secondary | ICD-10-CM | POA: Diagnosis not present

## 2014-01-08 HISTORY — DX: Elevated white blood cell count, unspecified: D72.829

## 2014-01-09 ENCOUNTER — Emergency Department (HOSPITAL_COMMUNITY): Payer: Medicare Other

## 2014-01-09 ENCOUNTER — Inpatient Hospital Stay (HOSPITAL_COMMUNITY): Payer: Medicare Other

## 2014-01-09 DIAGNOSIS — E43 Unspecified severe protein-calorie malnutrition: Secondary | ICD-10-CM | POA: Diagnosis present

## 2014-01-09 DIAGNOSIS — F17200 Nicotine dependence, unspecified, uncomplicated: Secondary | ICD-10-CM | POA: Diagnosis present

## 2014-01-09 DIAGNOSIS — K859 Acute pancreatitis without necrosis or infection, unspecified: Secondary | ICD-10-CM | POA: Diagnosis not present

## 2014-01-09 DIAGNOSIS — E876 Hypokalemia: Secondary | ICD-10-CM | POA: Diagnosis not present

## 2014-01-09 DIAGNOSIS — G8220 Paraplegia, unspecified: Secondary | ICD-10-CM | POA: Diagnosis present

## 2014-01-09 DIAGNOSIS — E162 Hypoglycemia, unspecified: Secondary | ICD-10-CM | POA: Diagnosis not present

## 2014-01-09 DIAGNOSIS — D6959 Other secondary thrombocytopenia: Secondary | ICD-10-CM | POA: Diagnosis not present

## 2014-01-09 DIAGNOSIS — F1910 Other psychoactive substance abuse, uncomplicated: Secondary | ICD-10-CM | POA: Diagnosis present

## 2014-01-09 DIAGNOSIS — E861 Hypovolemia: Secondary | ICD-10-CM | POA: Diagnosis not present

## 2014-01-09 DIAGNOSIS — F411 Generalized anxiety disorder: Secondary | ICD-10-CM | POA: Diagnosis not present

## 2014-01-09 DIAGNOSIS — H9190 Unspecified hearing loss, unspecified ear: Secondary | ICD-10-CM | POA: Diagnosis present

## 2014-01-09 DIAGNOSIS — D72829 Elevated white blood cell count, unspecified: Secondary | ICD-10-CM | POA: Diagnosis present

## 2014-01-09 DIAGNOSIS — K7200 Acute and subacute hepatic failure without coma: Secondary | ICD-10-CM | POA: Diagnosis not present

## 2014-01-09 DIAGNOSIS — F259 Schizoaffective disorder, unspecified: Secondary | ICD-10-CM | POA: Diagnosis present

## 2014-01-09 DIAGNOSIS — A4102 Sepsis due to Methicillin resistant Staphylococcus aureus: Secondary | ICD-10-CM | POA: Diagnosis present

## 2014-01-09 DIAGNOSIS — Z79899 Other long term (current) drug therapy: Secondary | ICD-10-CM | POA: Diagnosis not present

## 2014-01-09 DIAGNOSIS — G40909 Epilepsy, unspecified, not intractable, without status epilepticus: Secondary | ICD-10-CM | POA: Diagnosis present

## 2014-01-09 DIAGNOSIS — E2749 Other adrenocortical insufficiency: Secondary | ICD-10-CM | POA: Diagnosis present

## 2014-01-09 DIAGNOSIS — K55059 Acute (reversible) ischemia of intestine, part and extent unspecified: Secondary | ICD-10-CM | POA: Diagnosis not present

## 2014-01-09 DIAGNOSIS — Z515 Encounter for palliative care: Secondary | ICD-10-CM | POA: Diagnosis not present

## 2014-01-09 DIAGNOSIS — G9389 Other specified disorders of brain: Secondary | ICD-10-CM | POA: Diagnosis present

## 2014-01-09 DIAGNOSIS — K929 Disease of digestive system, unspecified: Secondary | ICD-10-CM | POA: Diagnosis not present

## 2014-01-09 DIAGNOSIS — I959 Hypotension, unspecified: Secondary | ICD-10-CM

## 2014-01-09 DIAGNOSIS — E8809 Other disorders of plasma-protein metabolism, not elsewhere classified: Secondary | ICD-10-CM | POA: Diagnosis not present

## 2014-01-09 DIAGNOSIS — E039 Hypothyroidism, unspecified: Secondary | ICD-10-CM | POA: Diagnosis present

## 2014-01-09 DIAGNOSIS — J90 Pleural effusion, not elsewhere classified: Secondary | ICD-10-CM | POA: Diagnosis not present

## 2014-01-09 DIAGNOSIS — J189 Pneumonia, unspecified organism: Secondary | ICD-10-CM | POA: Diagnosis present

## 2014-01-09 DIAGNOSIS — J438 Other emphysema: Secondary | ICD-10-CM | POA: Diagnosis present

## 2014-01-09 DIAGNOSIS — Z66 Do not resuscitate: Secondary | ICD-10-CM | POA: Diagnosis not present

## 2014-01-09 DIAGNOSIS — K219 Gastro-esophageal reflux disease without esophagitis: Secondary | ICD-10-CM | POA: Diagnosis present

## 2014-01-09 DIAGNOSIS — Z681 Body mass index (BMI) 19 or less, adult: Secondary | ICD-10-CM | POA: Diagnosis not present

## 2014-01-09 DIAGNOSIS — K560 Paralytic ileus: Secondary | ICD-10-CM | POA: Diagnosis not present

## 2014-01-09 DIAGNOSIS — J9600 Acute respiratory failure, unspecified whether with hypoxia or hypercapnia: Secondary | ICD-10-CM

## 2014-01-09 DIAGNOSIS — G9340 Encephalopathy, unspecified: Secondary | ICD-10-CM | POA: Diagnosis present

## 2014-01-09 DIAGNOSIS — I824Y9 Acute embolism and thrombosis of unspecified deep veins of unspecified proximal lower extremity: Secondary | ICD-10-CM | POA: Diagnosis not present

## 2014-01-09 DIAGNOSIS — E874 Mixed disorder of acid-base balance: Secondary | ICD-10-CM | POA: Diagnosis not present

## 2014-01-09 DIAGNOSIS — D684 Acquired coagulation factor deficiency: Secondary | ICD-10-CM | POA: Diagnosis not present

## 2014-01-09 DIAGNOSIS — R6521 Severe sepsis with septic shock: Secondary | ICD-10-CM | POA: Diagnosis present

## 2014-01-09 DIAGNOSIS — D649 Anemia, unspecified: Secondary | ICD-10-CM | POA: Diagnosis present

## 2014-01-09 DIAGNOSIS — R0602 Shortness of breath: Secondary | ICD-10-CM | POA: Diagnosis present

## 2014-01-09 DIAGNOSIS — N170 Acute kidney failure with tubular necrosis: Secondary | ICD-10-CM | POA: Diagnosis not present

## 2014-01-09 DIAGNOSIS — R64 Cachexia: Secondary | ICD-10-CM | POA: Diagnosis present

## 2014-01-09 DIAGNOSIS — R6520 Severe sepsis without septic shock: Secondary | ICD-10-CM | POA: Diagnosis present

## 2014-01-09 DIAGNOSIS — R34 Anuria and oliguria: Secondary | ICD-10-CM | POA: Diagnosis not present

## 2014-01-09 DIAGNOSIS — Z993 Dependence on wheelchair: Secondary | ICD-10-CM | POA: Diagnosis not present

## 2014-01-09 DIAGNOSIS — A419 Sepsis, unspecified organism: Secondary | ICD-10-CM | POA: Diagnosis present

## 2014-01-09 DIAGNOSIS — Y836 Removal of other organ (partial) (total) as the cause of abnormal reaction of the patient, or of later complication, without mention of misadventure at the time of the procedure: Secondary | ICD-10-CM | POA: Diagnosis not present

## 2014-01-09 LAB — CBC
HCT: 32.3 % — ABNORMAL LOW (ref 39.0–52.0)
HEMATOCRIT: 34.6 % — AB (ref 39.0–52.0)
HEMOGLOBIN: 11.8 g/dL — AB (ref 13.0–17.0)
Hemoglobin: 10.3 g/dL — ABNORMAL LOW (ref 13.0–17.0)
MCH: 29.4 pg (ref 26.0–34.0)
MCH: 30.5 pg (ref 26.0–34.0)
MCHC: 31.9 g/dL (ref 30.0–36.0)
MCHC: 34.1 g/dL (ref 30.0–36.0)
MCV: 89.4 fL (ref 78.0–100.0)
MCV: 92.3 fL (ref 78.0–100.0)
Platelets: 151 10*3/uL (ref 150–400)
Platelets: 385 10*3/uL (ref 150–400)
RBC: 3.5 MIL/uL — ABNORMAL LOW (ref 4.22–5.81)
RBC: 3.87 MIL/uL — ABNORMAL LOW (ref 4.22–5.81)
RDW: 14.2 % (ref 11.5–15.5)
RDW: 14.4 % (ref 11.5–15.5)
WBC: 18 10*3/uL — ABNORMAL HIGH (ref 4.0–10.5)
WBC: 36 10*3/uL — AB (ref 4.0–10.5)

## 2014-01-09 LAB — LACTIC ACID, PLASMA
Lactic Acid, Venous: 3.5 mmol/L — ABNORMAL HIGH (ref 0.5–2.2)
Lactic Acid, Venous: 4.7 mmol/L — ABNORMAL HIGH (ref 0.5–2.2)

## 2014-01-09 LAB — URINALYSIS, ROUTINE W REFLEX MICROSCOPIC
Glucose, UA: NEGATIVE mg/dL
KETONES UR: NEGATIVE mg/dL
Leukocytes, UA: NEGATIVE
Nitrite: NEGATIVE
PROTEIN: 30 mg/dL — AB
Specific Gravity, Urine: 1.019 (ref 1.005–1.030)
Urobilinogen, UA: 1 mg/dL (ref 0.0–1.0)
pH: 6 (ref 5.0–8.0)

## 2014-01-09 LAB — MRSA PCR SCREENING: MRSA by PCR: POSITIVE — AB

## 2014-01-09 LAB — POCT I-STAT 3, ART BLOOD GAS (G3+)
ACID-BASE DEFICIT: 14 mmol/L — AB (ref 0.0–2.0)
Acid-base deficit: 18 mmol/L — ABNORMAL HIGH (ref 0.0–2.0)
Acid-base deficit: 8 mmol/L — ABNORMAL HIGH (ref 0.0–2.0)
BICARBONATE: 11.3 meq/L — AB (ref 20.0–24.0)
BICARBONATE: 17 meq/L — AB (ref 20.0–24.0)
Bicarbonate: 13.9 mEq/L — ABNORMAL LOW (ref 20.0–24.0)
O2 SAT: 100 %
O2 SAT: 78 %
O2 Saturation: 100 %
PCO2 ART: 40.1 mmHg (ref 35.0–45.0)
PCO2 ART: 29 mmHg — AB (ref 35.0–45.0)
PH ART: 7.058 — AB (ref 7.350–7.450)
PO2 ART: 60 mmHg — AB (ref 80.0–100.0)
Patient temperature: 98.06
Patient temperature: 92.2
TCO2: 15 mmol/L (ref 0–100)
TCO2: 13 mmol/L (ref 0–100)
TCO2: 18 mmol/L (ref 0–100)
pCO2 arterial: 37.3 mmHg (ref 35.0–45.0)
pH, Arterial: 7.178 — CL (ref 7.350–7.450)
pH, Arterial: 7.358 (ref 7.350–7.450)
pO2, Arterial: 217 mmHg — ABNORMAL HIGH (ref 80.0–100.0)
pO2, Arterial: 386 mmHg — ABNORMAL HIGH (ref 80.0–100.0)

## 2014-01-09 LAB — PHENYTOIN LEVEL, TOTAL: PHENYTOIN LVL: 7.9 ug/mL — AB (ref 10.0–20.0)

## 2014-01-09 LAB — BASIC METABOLIC PANEL
BUN: 11 mg/dL (ref 6–23)
BUN: 13 mg/dL (ref 6–23)
BUN: 14 mg/dL (ref 6–23)
CALCIUM: 5.9 mg/dL — AB (ref 8.4–10.5)
CHLORIDE: 107 meq/L (ref 96–112)
CHLORIDE: 109 meq/L (ref 96–112)
CO2: 15 mEq/L — ABNORMAL LOW (ref 19–32)
CO2: 14 meq/L — AB (ref 19–32)
CO2: 16 mEq/L — ABNORMAL LOW (ref 19–32)
Calcium: 6.7 mg/dL — ABNORMAL LOW (ref 8.4–10.5)
Calcium: 6.4 mg/dL — CL (ref 8.4–10.5)
Chloride: 105 mEq/L (ref 96–112)
Creatinine, Ser: 0.9 mg/dL (ref 0.50–1.35)
Creatinine, Ser: 1.17 mg/dL (ref 0.50–1.35)
Creatinine, Ser: 1.16 mg/dL (ref 0.50–1.35)
GFR calc Af Amer: >90 mL/min (ref >90)
GFR calc Af Amer: 87 mL/min — ABNORMAL LOW (ref >90)
GFR calc non Af Amer: 75 mL/min — ABNORMAL LOW (ref >90)
GFR, EST AFRICAN AMERICAN: 88 mL/min — AB (ref >90)
GFR, EST NON AFRICAN AMERICAN: 76 mL/min — AB (ref >90)
Glucose, Bld: 120 mg/dL — ABNORMAL HIGH (ref 70–99)
Glucose, Bld: 132 mg/dL — ABNORMAL HIGH (ref 70–99)
Glucose, Bld: 139 mg/dL — ABNORMAL HIGH (ref 70–99)
POTASSIUM: 5.1 meq/L (ref 3.7–5.3)
POTASSIUM: 4.2 meq/L (ref 3.7–5.3)
Potassium: 4.1 mEq/L (ref 3.7–5.3)
SODIUM: 142 meq/L (ref 137–147)
SODIUM: 140 meq/L (ref 137–147)
SODIUM: 141 meq/L (ref 137–147)

## 2014-01-09 LAB — CARBOXYHEMOGLOBIN
Carboxyhemoglobin: 3 % — ABNORMAL HIGH (ref 0.5–1.5)
Carboxyhemoglobin: 1.4 % (ref 0.5–1.5)
Methemoglobin: 1.6 % — ABNORMAL HIGH (ref 0.0–1.5)
Methemoglobin: 1 % (ref 0.0–1.5)
O2 Saturation: 70.3 %
O2 Saturation: 59.1 %
Total hemoglobin: 11.5 g/dL — ABNORMAL LOW (ref 13.5–18.0)
Total hemoglobin: 10.8 g/dL — ABNORMAL LOW (ref 13.5–18.0)

## 2014-01-09 LAB — DIFFERENTIAL
Basophils Absolute: 0 10*3/uL (ref 0.0–0.1)
Basophils Relative: 1 % (ref 0–1)
EOS ABS: 0.4 10*3/uL (ref 0.0–0.7)
Eosinophils Relative: 2 % (ref 0–5)
LYMPHS PCT: 19 % (ref 12–46)
Lymphs Abs: 3.4 10*3/uL (ref 0.7–4.0)
MONOS PCT: 8 % (ref 3–12)
Monocytes Absolute: 1.4 10*3/uL — ABNORMAL HIGH (ref 0.1–1.0)
NEUTROS PCT: 70 % (ref 43–77)
Neutro Abs: 12.3 10*3/uL — ABNORMAL HIGH (ref 1.7–7.7)
SMEAR REVIEW: ADEQUATE

## 2014-01-09 LAB — COMPREHENSIVE METABOLIC PANEL
ALK PHOS: 94 U/L (ref 39–117)
ALT: 10 U/L (ref 0–53)
AST: 18 U/L (ref 0–37)
Albumin: 2.6 g/dL — ABNORMAL LOW (ref 3.5–5.2)
BILIRUBIN TOTAL: 0.4 mg/dL (ref 0.3–1.2)
BUN: 12 mg/dL (ref 6–23)
CHLORIDE: 96 meq/L (ref 96–112)
CO2: 23 mEq/L (ref 19–32)
Calcium: 8.5 mg/dL (ref 8.4–10.5)
Creatinine, Ser: 1.17 mg/dL (ref 0.50–1.35)
GFR calc non Af Amer: 75 mL/min — ABNORMAL LOW (ref >90)
GFR, EST AFRICAN AMERICAN: 87 mL/min — AB (ref >90)
GLUCOSE: 182 mg/dL — AB (ref 70–99)
POTASSIUM: 4 meq/L (ref 3.7–5.3)
SODIUM: 138 meq/L (ref 137–147)
TOTAL PROTEIN: 6.9 g/dL (ref 6.0–8.3)

## 2014-01-09 LAB — PROTIME-INR
INR: 2.02 — AB (ref 0.00–1.49)
PROTHROMBIN TIME: 22.2 s — AB (ref 11.6–15.2)

## 2014-01-09 LAB — TYPE AND SCREEN
ABO/RH(D): O NEG
Antibody Screen: NEGATIVE

## 2014-01-09 LAB — URINE MICROSCOPIC-ADD ON

## 2014-01-09 LAB — TROPONIN I: Troponin I: <0.3 ng/mL (ref <0.30)

## 2014-01-09 LAB — GLUCOSE, CAPILLARY
GLUCOSE-CAPILLARY: 136 mg/dL — AB (ref 70–99)
Glucose-Capillary: 131 mg/dL — ABNORMAL HIGH (ref 70–99)
Glucose-Capillary: 138 mg/dL — ABNORMAL HIGH (ref 70–99)
Glucose-Capillary: 186 mg/dL — ABNORMAL HIGH (ref 70–99)
Glucose-Capillary: 230 mg/dL — ABNORMAL HIGH (ref 70–99)

## 2014-01-09 LAB — SALICYLATE LEVEL

## 2014-01-09 LAB — I-STAT CG4 LACTIC ACID, ED: LACTIC ACID, VENOUS: 4.47 mmol/L — AB (ref 0.5–2.2)

## 2014-01-09 LAB — FIBRINOGEN: FIBRINOGEN: 256 mg/dL (ref 204–475)

## 2014-01-09 LAB — ACETAMINOPHEN LEVEL

## 2014-01-09 LAB — APTT: aPTT: 55 seconds — ABNORMAL HIGH (ref 24–37)

## 2014-01-09 LAB — ABO/RH: ABO/RH(D): O NEG

## 2014-01-09 LAB — PHOSPHORUS: Phosphorus: 4.4 mg/dL (ref 2.3–4.6)

## 2014-01-09 LAB — KETONES, QUALITATIVE: ACETONE BLD: NEGATIVE

## 2014-01-09 LAB — CORTISOL: Cortisol, Plasma: 153.9 ug/dL

## 2014-01-09 LAB — MAGNESIUM: Magnesium: 1.4 mg/dL — ABNORMAL LOW (ref 1.5–2.5)

## 2014-01-09 MED ORDER — INSULIN ASPART 100 UNIT/ML ~~LOC~~ SOLN
2.0000 [IU] | SUBCUTANEOUS | Status: DC
  Administered 2014-01-09: 2 [IU] via SUBCUTANEOUS
  Administered 2014-01-09: 4 [IU] via SUBCUTANEOUS
  Administered 2014-01-09: 6 [IU] via SUBCUTANEOUS
  Administered 2014-01-09: 2 [IU] via SUBCUTANEOUS
  Administered 2014-01-10 (×2): 6 [IU] via SUBCUTANEOUS

## 2014-01-09 MED ORDER — MUPIROCIN 2 % EX OINT
1.0000 "application " | TOPICAL_OINTMENT | Freq: Two times a day (BID) | CUTANEOUS | Status: AC
  Administered 2014-01-09 – 2014-01-13 (×10): 1 via NASAL
  Filled 2014-01-09: qty 22

## 2014-01-09 MED ORDER — ETOMIDATE 2 MG/ML IV SOLN
20.0000 mg | Freq: Once | INTRAVENOUS | Status: AC
  Administered 2014-01-09: 20 mg via INTRAVENOUS

## 2014-01-09 MED ORDER — FENTANYL CITRATE 0.05 MG/ML IJ SOLN
INTRAMUSCULAR | Status: AC
  Administered 2014-01-09: 50 ug
  Filled 2014-01-09: qty 8

## 2014-01-09 MED ORDER — PNEUMOCOCCAL VAC POLYVALENT 25 MCG/0.5ML IJ INJ
0.5000 mL | INJECTION | INTRAMUSCULAR | Status: AC
Start: 2014-01-10 — End: 2014-01-10
  Administered 2014-01-10: 0.5 mL via INTRAMUSCULAR
  Filled 2014-01-09: qty 0.5

## 2014-01-09 MED ORDER — NOREPINEPHRINE BITARTRATE 1 MG/ML IJ SOLN
2.0000 ug/min | INTRAVENOUS | Status: DC
  Administered 2014-01-09: 10 ug/min via INTRAVENOUS
  Filled 2014-01-09: qty 8

## 2014-01-09 MED ORDER — FAMOTIDINE IN NACL 20-0.9 MG/50ML-% IV SOLN
20.0000 mg | Freq: Two times a day (BID) | INTRAVENOUS | Status: DC
  Administered 2014-01-09 – 2014-01-10 (×3): 20 mg via INTRAVENOUS
  Filled 2014-01-09 (×4): qty 50

## 2014-01-09 MED ORDER — PANTOPRAZOLE SODIUM 40 MG PO TBEC
40.0000 mg | DELAYED_RELEASE_TABLET | Freq: Every day | ORAL | Status: DC
  Administered 2014-01-09: 40 mg via ORAL
  Filled 2014-01-09: qty 1

## 2014-01-09 MED ORDER — CHLORHEXIDINE GLUCONATE CLOTH 2 % EX PADS
6.0000 | MEDICATED_PAD | Freq: Every day | CUTANEOUS | Status: AC
Start: 2014-01-09 — End: 2014-01-13
  Administered 2014-01-09 – 2014-01-13 (×5): 6 via TOPICAL

## 2014-01-09 MED ORDER — ALBUTEROL SULFATE (2.5 MG/3ML) 0.083% IN NEBU: 2.5000 mg | INHALATION_SOLUTION | RESPIRATORY_TRACT | Status: DC

## 2014-01-09 MED ORDER — ETOMIDATE 2 MG/ML IV SOLN: 20.0000 mg/kg | Freq: Once | INTRAVENOUS | Status: DC

## 2014-01-09 MED ORDER — VANCOMYCIN HCL IN DEXTROSE 1-5 GM/200ML-% IV SOLN
1000.0000 mg | Freq: Once | INTRAVENOUS | Status: AC
  Administered 2014-01-09: 1000 mg via INTRAVENOUS
  Filled 2014-01-09: qty 200

## 2014-01-09 MED ORDER — ROCURONIUM BROMIDE 50 MG/5ML IV SOLN
50.0000 mg | Freq: Once | INTRAVENOUS | Status: AC
  Administered 2014-01-09: 50 mg via INTRAVENOUS
  Filled 2014-01-09: qty 5

## 2014-01-09 MED ORDER — FENTANYL CITRATE 0.05 MG/ML IJ SOLN
0.0000 ug/h | INTRAMUSCULAR | Status: DC
  Administered 2014-01-11: 150 ug/h via INTRAVENOUS
  Administered 2014-01-12 (×2): 125 ug/h via INTRAVENOUS
  Administered 2014-01-13: 150 ug/h via INTRAVENOUS
  Administered 2014-01-14: 100 ug/h via INTRAVENOUS
  Administered 2014-01-14: 125 ug/h via INTRAVENOUS
  Administered 2014-01-15: 100 ug/h via INTRAVENOUS
  Administered 2014-01-16: 125 ug/h via INTRAVENOUS
  Administered 2014-01-17: 120 ug/h via INTRAVENOUS
  Administered 2014-01-18: 400 ug/h via INTRAVENOUS
  Administered 2014-01-18 – 2014-01-19 (×2): 200 ug/h via INTRAVENOUS
  Administered 2014-01-19 (×2): 400 ug/h via INTRAVENOUS
  Administered 2014-01-20 – 2014-01-22 (×5): 200 ug/h via INTRAVENOUS
  Administered 2014-01-24: 220 ug/h via INTRAVENOUS
  Filled 2014-01-09 (×27): qty 50

## 2014-01-09 MED ORDER — SODIUM CHLORIDE 0.9 % IV BOLUS (SEPSIS)
1000.0000 mL | Freq: Once | INTRAVENOUS | Status: AC
  Administered 2014-01-09: 1000 mL via INTRAVENOUS

## 2014-01-09 MED ORDER — HYDROCORTISONE NA SUCCINATE PF 100 MG IJ SOLR
100.0000 mg | Freq: Three times a day (TID) | INTRAMUSCULAR | Status: DC
  Administered 2014-01-09 – 2014-01-10 (×6): 100 mg via INTRAVENOUS
  Filled 2014-01-09 (×7): qty 2

## 2014-01-09 MED ORDER — HEPARIN SODIUM (PORCINE) 5000 UNIT/ML IJ SOLN
5000.0000 [IU] | Freq: Three times a day (TID) | INTRAMUSCULAR | Status: DC
  Administered 2014-01-09 – 2014-01-18 (×28): 5000 [IU] via SUBCUTANEOUS
  Filled 2014-01-09 (×31): qty 1

## 2014-01-09 MED ORDER — CHLORHEXIDINE GLUCONATE 0.12 % MT SOLN
15.0000 mL | Freq: Two times a day (BID) | OROMUCOSAL | Status: DC
  Administered 2014-01-09 – 2014-01-25 (×33): 15 mL via OROMUCOSAL
  Filled 2014-01-09 (×31): qty 15

## 2014-01-09 MED ORDER — PHENYTOIN SODIUM EXTENDED 100 MG PO CAPS
300.0000 mg | ORAL_CAPSULE | Freq: Every day | ORAL | Status: DC
  Administered 2014-01-09: 300 mg via ORAL
  Filled 2014-01-09 (×2): qty 3

## 2014-01-09 MED ORDER — FENTANYL CITRATE 0.05 MG/ML IJ SOLN
50.0000 ug | Freq: Once | INTRAMUSCULAR | Status: AC
  Administered 2014-01-09: 100 ug via INTRAVENOUS

## 2014-01-09 MED ORDER — SODIUM CHLORIDE 0.9 % IV BOLUS (SEPSIS)
1000.0000 mL | INTRAVENOUS | Status: DC | PRN
  Administered 2014-01-09: 1000 mL via INTRAVENOUS

## 2014-01-09 MED ORDER — PIPERACILLIN-TAZOBACTAM 3.375 G IVPB
3.3750 g | Freq: Three times a day (TID) | INTRAVENOUS | Status: DC
  Administered 2014-01-09: 3.375 g via INTRAVENOUS
  Administered 2014-01-09: 14:00:00 via INTRAVENOUS
  Administered 2014-01-10 – 2014-01-12 (×7): 3.375 g via INTRAVENOUS
  Filled 2014-01-09 (×11): qty 50

## 2014-01-09 MED ORDER — SODIUM CHLORIDE 0.9 % IV BOLUS (SEPSIS)
500.0000 mL | Freq: Once | INTRAVENOUS | Status: AC
  Administered 2014-01-09: 500 mL via INTRAVENOUS

## 2014-01-09 MED ORDER — DOPAMINE-DEXTROSE 3.2-5 MG/ML-% IV SOLN
10.0000 ug/kg/min | INTRAVENOUS | Status: DC
  Administered 2014-01-09: 10 ug/kg/min via INTRAVENOUS
  Filled 2014-01-09: qty 250

## 2014-01-09 MED ORDER — MAGNESIUM SULFATE 40 MG/ML IJ SOLN
2.0000 g | Freq: Once | INTRAMUSCULAR | Status: AC
  Administered 2014-01-09: 2 g via INTRAVENOUS
  Filled 2014-01-09: qty 50

## 2014-01-09 MED ORDER — INFLUENZA VAC SPLIT QUAD 0.5 ML IM SUSP
0.5000 mL | INTRAMUSCULAR | Status: AC
  Administered 2014-01-10: 0.5 mL via INTRAMUSCULAR
  Filled 2014-01-09: qty 0.5

## 2014-01-09 MED ORDER — FENTANYL BOLUS VIA INFUSION
25.0000 ug | INTRAVENOUS | Status: DC | PRN
  Filled 2014-01-09: qty 50

## 2014-01-09 MED ORDER — MIDAZOLAM HCL 2 MG/2ML IJ SOLN
INTRAMUSCULAR | Status: AC
  Administered 2014-01-09: 2 mg
  Filled 2014-01-09: qty 4

## 2014-01-09 MED ORDER — SODIUM CHLORIDE 0.9 % IV SOLN
1.0000 g | Freq: Once | INTRAVENOUS | Status: AC
  Administered 2014-01-09: 1 g via INTRAVENOUS
  Filled 2014-01-09: qty 10

## 2014-01-09 MED ORDER — NOREPINEPHRINE BITARTRATE 1 MG/ML IJ SOLN
2.0000 ug/min | INTRAVENOUS | Status: DC
  Administered 2014-01-09: 5 ug/min via INTRAVENOUS
  Administered 2014-01-09 (×3): 10 ug/min via INTRAVENOUS
  Administered 2014-01-09: 1 ug/min via INTRAVENOUS
  Administered 2014-01-09: 15 ug/min via INTRAVENOUS
  Administered 2014-01-09: 20 ug/min via INTRAVENOUS
  Administered 2014-01-09: 8 ug/min via INTRAVENOUS
  Administered 2014-01-09 (×2): 10 ug/min via INTRAVENOUS
  Administered 2014-01-09: 18 ug/min via INTRAVENOUS
  Administered 2014-01-09: 15 ug/min via INTRAVENOUS
  Administered 2014-01-09: 20 ug/min via INTRAVENOUS
  Administered 2014-01-11: 3 ug/min via INTRAVENOUS
  Filled 2014-01-09 (×3): qty 8

## 2014-01-09 MED ORDER — PIPERACILLIN-TAZOBACTAM 3.375 G IVPB 30 MIN
3.3750 g | Freq: Once | INTRAVENOUS | Status: AC
  Administered 2014-01-09: 3.375 g via INTRAVENOUS
  Filled 2014-01-09: qty 50

## 2014-01-09 MED ORDER — SODIUM CHLORIDE 0.9 % IV SOLN
250.0000 mL | INTRAVENOUS | Status: DC | PRN
  Administered 2014-01-10: via INTRAVENOUS

## 2014-01-09 MED ORDER — ALBUTEROL SULFATE (2.5 MG/3ML) 0.083% IN NEBU
INHALATION_SOLUTION | RESPIRATORY_TRACT | Status: AC
  Filled 2014-01-09: qty 6

## 2014-01-09 MED ORDER — VANCOMYCIN HCL IN DEXTROSE 750-5 MG/150ML-% IV SOLN
750.0000 mg | INTRAVENOUS | Status: DC
  Administered 2014-01-10 – 2014-01-11 (×2): 750 mg via INTRAVENOUS
  Filled 2014-01-09 (×2): qty 150

## 2014-01-09 MED ORDER — LEVOTHYROXINE SODIUM 75 MCG PO TABS
75.0000 ug | ORAL_TABLET | Freq: Every day | ORAL | Status: DC
  Administered 2014-01-09 – 2014-01-10 (×2): 75 ug via ORAL
  Filled 2014-01-09 (×3): qty 1

## 2014-01-09 MED ORDER — ALBUTEROL SULFATE (2.5 MG/3ML) 0.083% IN NEBU: 2.5000 mg | INHALATION_SOLUTION | RESPIRATORY_TRACT | Status: DC | PRN

## 2014-01-09 MED ORDER — IPRATROPIUM-ALBUTEROL 0.5-2.5 (3) MG/3ML IN SOLN
3.0000 mL | RESPIRATORY_TRACT | Status: DC
  Administered 2014-01-09 – 2014-01-10 (×8): 3 mL via RESPIRATORY_TRACT
  Filled 2014-01-09 (×8): qty 3

## 2014-01-09 MED ORDER — BIOTENE DRY MOUTH MT LIQD
15.0000 mL | Freq: Four times a day (QID) | OROMUCOSAL | Status: DC
  Administered 2014-01-09 – 2014-01-25 (×64): 15 mL via OROMUCOSAL

## 2014-01-09 MED ORDER — PANTOPRAZOLE SODIUM 40 MG IV SOLR: 40.0000 mg | Freq: Every day | INTRAVENOUS | Status: DC

## 2014-01-09 MED ORDER — HYDROCORTISONE NA SUCCINATE PF 100 MG IJ SOLR
100.0000 mg | Freq: Once | INTRAMUSCULAR | Status: AC
  Administered 2014-01-09: 100 mg via INTRAVENOUS
  Filled 2014-01-09: qty 2

## 2014-01-09 MED ORDER — SODIUM BICARBONATE 8.4 % IV SOLN
INTRAVENOUS | Status: DC
  Administered 2014-01-09 – 2014-01-10 (×3): via INTRAVENOUS
  Filled 2014-01-09 (×6): qty 150

## 2014-01-09 NOTE — ED Provider Notes (Signed)
CSN: [id number]     Arrival date & time 12/25/2013  2336 History   First MD Initiated Contact with Patient 01/09/14 0022     Chief Complaint  Patient presents with  . Shortness of Breath     (Consider location/radiation/quality/duration/timing/severity/associated sxs/prior Treatment) Patient is a 43 y.o. male presenting with shortness of breath. The history is provided by the patient.  Shortness of Breath He is actually complaining more about leg pain and dyspnea. He is complaining of pain in his left leg for about a week. He is a very poor historian and is difficult to get information from him. He is wheelchair-bound denies any trauma. Nothing makes his leg hurt more nothing makes it hurt less. He's not able to put a number on the pain. He states that he has chronic dyspnea secondary to COPD and this has not changed. He denies fever or chills and he denies cough. There's been no vomiting or diarrhea.  Past Medical History  Diagnosis Date  . Chronic pain   . Anxiety   . Schizoaffective disorder   . Hearing loss   . Seizure   . Arthritis   . Asthma   . Foot drop   . Anemia   . Dehydration   . Hypotension   . GERD (gastroesophageal reflux disease)   . Hypothyroidism   . Constipation   . Polysubstance abuse   . Cancer   . Leukocytosis    History reviewed. No pertinent past surgical history. History reviewed. No pertinent family history. History  Substance Use Topics  . Smoking status: Current Every Day Smoker -- 1.50 packs/day for 27 years    Types: Cigarettes  . Smokeless tobacco: Never Used  . Alcohol Use: No    Review of Systems  Respiratory: Positive for shortness of breath.   All other systems reviewed and are negative.      Allergies  Fexofenadine hcl; Percocet; and Thorazine  Home Medications   Current Outpatient Rx  Name  Route  Sig  Dispense  Refill  . acetaminophen (TYLENOL) 325 MG tablet   Oral   Take 650 mg by mouth every 6 (six) hours as needed.  Pain         . ALPRAZolam (XANAX) 0.5 MG tablet   Oral   Take 1 tablet (0.5 mg total) by mouth daily at 12 noon.   30 tablet   0   . amoxicillin-clavulanate (AUGMENTIN) 875-125 MG per tablet   Oral   Take 1 tablet by mouth 2 (two) times daily.   14 tablet   0     Only to be filled if patient develops recurrent fe ...   . benztropine (COGENTIN) 0.5 MG tablet   Oral   Take 0.5 mg by mouth 2 (two) times daily. Given 2 hours after thyroid medication         . doxepin (SINEQUAN) 50 MG capsule   Oral   Take 50 mg by mouth at bedtime.         . ferrous sulfate 325 (65 FE) MG tablet   Oral   Take 325 mg by mouth daily with breakfast.         . FLUoxetine (PROZAC) 10 MG capsule   Oral   Take 10 mg by mouth daily.         Marland Kitchen FLUoxetine (PROZAC) 20 MG capsule   Oral   Take 20 mg by mouth daily. With 10 mg to = 30 mg         .  Fluticasone-Salmeterol (ADVAIR) 100-50 MCG/DOSE AEPB   Inhalation   Inhale 1 puff into the lungs 2 (two) times daily.         Marland Kitchen guaifenesin (ROBITUSSIN) 100 MG/5ML syrup   Oral   Take 200 mg by mouth every 4 (four) hours as needed for cough.         Marland Kitchen ipratropium-albuterol (DUONEB) 0.5-2.5 (3) MG/3ML SOLN   Nebulization   Take 3 mLs by nebulization 3 (three) times daily.   360 mL   0   . levothyroxine (SYNTHROID, LEVOTHROID) 75 MCG tablet   Oral   Take 75 mcg by mouth daily.         Marland Kitchen loratadine (CLARITIN) 10 MG tablet   Oral   Take 10 mg by mouth daily.         . mirtazapine (REMERON) 45 MG tablet   Oral   Take 22.5 mg by mouth at bedtime.         . Nutritional Supplements (ENSURE PO)   Oral   Take 1 Can by mouth 3 (three) times daily with meals.         Marland Kitchen OLANZapine (ZYPREXA) 15 MG tablet   Oral   Take 15 mg by mouth at bedtime.         Marland Kitchen omeprazole (PRILOSEC) 40 MG capsule   Oral   Take 40 mg by mouth daily.          . ondansetron (ZOFRAN ODT) 4 MG disintegrating tablet   Oral   Take 1 tablet (4 mg  total) by mouth every 8 (eight) hours as needed for nausea.   20 tablet   0   . phenytoin (DILANTIN) 100 MG ER capsule   Oral   Take 300 mg by mouth at bedtime.         . polyethylene glycol powder (GLYCOLAX/MIRALAX) powder   Oral   Take 17 g by mouth daily.         . simvastatin (ZOCOR) 10 MG tablet   Oral   Take 10 mg by mouth at bedtime.         . solifenacin (VESICARE) 5 MG tablet   Oral   Take 5 mg by mouth daily.          Marland Kitchen EXPIRED: sucralfate (CARAFATE) 1 G tablet   Oral   Take 1 g by mouth 4 (four) times daily.         . sucralfate (CARAFATE) 1 G tablet   Oral   Take 1 g by mouth 4 (four) times daily.         . traMADol (ULTRAM) 50 MG tablet   Oral   Take 50 mg by mouth every 6 (six) hours as needed for pain (pain).         Marland Kitchen zolpidem (AMBIEN) 5 MG tablet   Oral   Take 1 tablet (5 mg total) by mouth at bedtime.   30 tablet   0    BP 80/54  Pulse 118  Temp(Src) 98.1 F (36.7 C) (Rectal)  Resp 29  SpO2 100% Physical Exam  Nursing note and vitals reviewed.  Cachectic 43 year old male, resting comfortably and in no acute distress. Vital signs are significant for tachycardia with heart rate of 118, tachypnea with respiratory rate of 29, and hypertension with blood pressure 80/54. Oxygen saturation is 100%, which is normal. Head is normocephalic and atraumatic. PERRLA, EOMI. Oropharynx is clear. Neck is nontender and supple without adenopathy or JVD.  Back is nontender and there is no CVA tenderness. Lungs are clear without rales, wheezes, or rhonchi. Chest is nontender. Heart has regular rate and rhythm without murmur. Abdomen is soft, flat, nontender without masses or hepatosplenomegaly and peristalsis is normoactive. Extremities are cachectic and wasted. There is flexion contracture of both legs. The right leg is significantly cooler than the left. Distal pulses are thready 1+ but there is prompt capillary refill. No focal processes are  seen.. Skin is warm and dry without rash. Neurologic: Mental status is normal, cranial nerves are intact, there are no motor or sensory deficits.  ED Course  Procedures (including critical care time) Labs Review Results for orders placed during the hospital encounter of 01/02/2014  CBC      Result Value Ref Range   WBC 18.0 (*) 4.0 - 10.5 K/uL   RBC 3.87 (*) 4.22 - 5.81 MIL/uL   Hemoglobin 11.8 (*) 13.0 - 17.0 g/dL   HCT 34.6 (*) 39.0 - 52.0 %   MCV 89.4  78.0 - 100.0 fL   MCH 30.5  26.0 - 34.0 pg   MCHC 34.1  30.0 - 36.0 g/dL   RDW 14.2  11.5 - 15.5 %   Platelets 385  150 - 400 K/uL  COMPREHENSIVE METABOLIC PANEL      Result Value Ref Range   Sodium 138  137 - 147 mEq/L   Potassium 4.0  3.7 - 5.3 mEq/L   Chloride 96  96 - 112 mEq/L   CO2 23  19 - 32 mEq/L   Glucose, Bld 182 (*) 70 - 99 mg/dL   BUN 12  6 - 23 mg/dL   Creatinine, Ser 1.17  0.50 - 1.35 mg/dL   Calcium 8.5  8.4 - 10.5 mg/dL   Total Protein 6.9  6.0 - 8.3 g/dL   Albumin 2.6 (*) 3.5 - 5.2 g/dL   AST 18  0 - 37 U/L   ALT 10  0 - 53 U/L   Alkaline Phosphatase 94  39 - 117 U/L   Total Bilirubin 0.4  0.3 - 1.2 mg/dL   GFR calc non Af Amer 75 (*) >90 mL/min   GFR calc Af Amer 87 (*) >90 mL/min  URINALYSIS, ROUTINE W REFLEX MICROSCOPIC      Result Value Ref Range   Color, Urine AMBER (*) YELLOW   APPearance CLOUDY (*) CLEAR   Specific Gravity, Urine 1.019  1.005 - 1.030   pH 6.0  5.0 - 8.0   Glucose, UA NEGATIVE  NEGATIVE mg/dL   Hgb urine dipstick MODERATE (*) NEGATIVE   Bilirubin Urine SMALL (*) NEGATIVE   Ketones, ur NEGATIVE  NEGATIVE mg/dL   Protein, ur 30 (*) NEGATIVE mg/dL   Urobilinogen, UA 1.0  0.0 - 1.0 mg/dL   Nitrite NEGATIVE  NEGATIVE   Leukocytes, UA NEGATIVE  NEGATIVE  DIFFERENTIAL      Result Value Ref Range   Neutro Abs 12.3 (*) 1.7 - 7.7 K/uL   Lymphs Abs 3.4  0.7 - 4.0 K/uL   Monocytes Absolute 1.4 (*) 0.1 - 1.0 K/uL   Eosinophils Absolute 0.4  0.0 - 0.7 K/uL   Basophils Absolute 0.0   0.0 - 0.1 K/uL   Neutrophils Relative % 70  43 - 77 %   Lymphocytes Relative 19  12 - 46 %   Monocytes Relative 8  3 - 12 %   Eosinophils Relative 2  0 - 5 %   Basophils Relative 1  0 - 1 %  WBC Morphology MILD LEFT SHIFT (1-5% METAS, OCC MYELO, OCC BANDS)     Smear Review PLATELETS APPEAR ADEQUATE    URINE MICROSCOPIC-ADD ON      Result Value Ref Range   Squamous Epithelial / LPF RARE  RARE   WBC, UA 0-2  <3 WBC/hpf   RBC / HPF 11-20  <3 RBC/hpf   Bacteria, UA RARE  RARE   Urine-Other MUCOUS PRESENT    PROTIME-INR      Result Value Ref Range   Prothrombin Time 22.2 (*) 11.6 - 15.2 seconds   INR 2.02 (*) 0.00 - 1.49  APTT      Result Value Ref Range   aPTT 55 (*) 24 - 37 seconds  FIBRINOGEN      Result Value Ref Range   Fibrinogen 256  204 - 475 mg/dL  I-STAT CG4 LACTIC ACID, ED      Result Value Ref Range   Lactic Acid, Venous 4.47 (*) 0.5 - 2.2 mmol/L   Dg Chest Port 1 View  01/09/2014   CLINICAL DATA:  Dyspnea, history of asthma.  EXAM: PORTABLE CHEST - 1 VIEW  COMPARISON:  DG CHEST 2 VIEW dated 09/12/2013  FINDINGS: Cardiomediastinal silhouette is unremarkable. The lungs are clear without pleural effusions or focal consolidations. Increased lung volumes with mild interstitial prominence Trachea projects midline and there is no pneumothorax. Soft tissue planes and included osseous structures are non-suspicious. Multiple EKG lines overlie the patient and may obscure subtle underlying pathology.  IMPRESSION: Increased lung volumes with mild interstitial prominence may reflect reactive airway disease/bronchiolitis without focal consolidation.   Electronically Signed   By: Awilda Metro   On: 01/09/2014 02:25    Imaging Review Dg Chest Port 1 View  01/09/2014   CLINICAL DATA:  Dyspnea, history of asthma.  EXAM: PORTABLE CHEST - 1 VIEW  COMPARISON:  DG CHEST 2 VIEW dated 09/12/2013  FINDINGS: Cardiomediastinal silhouette is unremarkable. The lungs are clear without pleural  effusions or focal consolidations. Increased lung volumes with mild interstitial prominence Trachea projects midline and there is no pneumothorax. Soft tissue planes and included osseous structures are non-suspicious. Multiple EKG lines overlie the patient and may obscure subtle underlying pathology.  IMPRESSION: Increased lung volumes with mild interstitial prominence may reflect reactive airway disease/bronchiolitis without focal consolidation.   Electronically Signed   By: Awilda Metro   On: 01/09/2014 02:25   Images viewed by me.   EKG Interpretation   Date/Time:  Saturday January 08 2014 23:42:43 EDT Ventricular Rate:  113 PR Interval:  155 QRS Duration: 91 QT Interval:  351 QTC Calculation: 481 R Axis:   -110 Text Interpretation:  Sinus tachycardia Inferior infarct, old Anterior  infarct, old When compared with ECG of 07/19/2013, No significant change  was found Confirmed by [facility]  MD, Tomeko Scoville ([id number]) on 01/09/2014 12:37:56 AM      CRITICAL CARE Performed by: NUUVO,ZDGUY Total critical care time: 150 minutes Critical care time was exclusive of separately billable procedures and treating other patients. Critical care was necessary to treat or prevent imminent or life-threatening deterioration. Critical care was time spent personally by me on the following activities: development of treatment plan with patient and/or surrogate as well as nursing, discussions with consultants, evaluation of patient's response to treatment, examination of patient, obtaining history from patient or surrogate, ordering and performing treatments and interventions, ordering and review of laboratory studies, ordering and review of radiographic studies, pulse oximetry and re-evaluation of patient's condition.  MDM  Final diagnoses:  Hypotension  Anemia  Leukocytosis  Elevated lactic acid level    Leg pain and hypotension of uncertain cause. He is started on IV fluid boluses. Lactic acid is somewhat  elevated at 4.47. He also has leukocytosis. Old records are reviewed and he has been admitted to the hospital because of community acquired pneumonia and also leukocytosis of uncertain cause.  Patient initially had good blood pressure response to fluids, but then reverted to hypotension. Leukocytosis is noted but has been present on numerous other occasions without infection being present. There was concern for possible Addison's disease causing hypotension that was refractory to IV fluids. He was started on dopamine for blood pressure support and was given a dose of hydrocortisone intravenously but with no further improvement in his blood pressure. Because of persistent hypotension and need for pressors, it was felt he needed admission to ICU. Critical care service was consulted and agreed to admit the patient.  Delora Fuel, MD 38/10/17 [id number]

## 2014-01-09 NOTE — H&P (Signed)
PULMONARY / CRITICAL CARE MEDICINE   Name: [name] [name] MRN: [id number] DOB: May 17, 1971    ADMISSION DATE:  12/14/2013  PRIMARY SERVICE: PCCM  CHIEF COMPLAINT:  SOB, leg pain.  BRIEF PATIENT DESCRIPTION:  43 years old male resident of an assisted living facility with PMH relevant for schizoaffective disorder, polysubstance abuse, seizures, COPD,  malnutrition, GERD. Presents with SOB and found to be hypotensive despite 2 L of IVF's. Started on dopamine via peripheral IV by ED and  PCCM called to admit. No clear source of infection.   SIGNIFICANT EVENTS / STUDIES:  - Chest X ray: Emphysema. No acute infiltrates.    LINES / TUBES: - Peripheral IV's  CULTURES: - Blood cultures sent - Urine cultures ordered - Sputum culture ordered.  ANTIBIOTICS: - Zosyn - Vancomycin  HISTORY OF PRESENT ILLNESS:   43 years old male resident of an assisted living facility with PMH relevant for schizoaffective disorder, polysubstance abuse, seizures, COPD,  malnutrition, GERD. Presents with SOB and found to be hypotensive despite 2 L of IVF's. He is a very poor historian and his baseline mental status is not clear for me. Today he seems to be confused and unable to provide a reliable history. He does not have any specific complains except for mild SOB. His WBC is elevated but is chronically elevated.  Of note, he has at least one previous very similar admission in late 2014. At that time he was admitted for "possible pneumonia" , he was hypotensive, his WBC was high and his blood cultures were negative. His cortisol level was 4.7. He received Zosyn and vancomycin and is unclear if he received stress dose steroids. At the time of my exam the patient is hypotensive with MAP in the low 60's and after 2 L of NS.  PAST MEDICAL HISTORY :  Past Medical History  Diagnosis Date  . Chronic pain   . Anxiety   . Schizoaffective disorder   . Hearing loss   . Seizure   . Arthritis   . Asthma   . Foot drop    . Anemia   . Dehydration   . Hypotension   . GERD (gastroesophageal reflux disease)   . Hypothyroidism   . Constipation   . Polysubstance abuse   . Cancer   . Leukocytosis    History reviewed. No pertinent past surgical history. Prior to Admission medications   Medication Sig Start Date End Date Taking? Authorizing Provider  albuterol (PROVENTIL HFA;VENTOLIN HFA) 108 (90 BASE) MCG/ACT inhaler Inhale 2 puffs into the lungs every 4 (four) hours as needed for wheezing or shortness of breath.   Yes Historical Provider, MD  ALPRAZolam Duanne Moron) 0.5 MG tablet Take 1 tablet (0.5 mg total) by mouth daily at 12 noon. 07/21/13  Yes Janece Canterbury, MD  benztropine (COGENTIN) 0.5 MG tablet Take 0.5-1 mg by mouth See admin instructions. Take 2 tablets in the morning and 1 tablet at bedtime   Yes Historical Provider, MD  doxepin (SINEQUAN) 50 MG capsule Take 50 mg by mouth at bedtime.   Yes Historical Provider, MD  ferrous sulfate 325 (65 FE) MG tablet Take 325 mg by mouth daily with breakfast.   Yes Historical Provider, MD  FLUoxetine (PROZAC) 10 MG capsule Take 10 mg by mouth daily.   Yes Historical Provider, MD  FLUoxetine (PROZAC) 20 MG capsule Take 20 mg by mouth daily. With 10 mg to = 30 mg   Yes Historical Provider, MD  Fluticasone-Salmeterol (ADVAIR) 100-50 MCG/DOSE AEPB Inhale 1  puff into the lungs 2 (two) times daily.   Yes Historical Provider, MD  guaifenesin (ROBITUSSIN) 100 MG/5ML syrup Take 200 mg by mouth every 4 (four) hours as needed for cough.   Yes Historical Provider, MD  levothyroxine (SYNTHROID, LEVOTHROID) 75 MCG tablet Take 75 mcg by mouth daily.   Yes Historical Provider, MD  loratadine (CLARITIN) 10 MG tablet Take 10 mg by mouth daily.   Yes Historical Provider, MD  megestrol (MEGACE) 400 MG/10ML suspension Take 400 mg by mouth 2 (two) times daily.   Yes Historical Provider, MD  metoprolol tartrate (LOPRESSOR) 25 MG tablet Take 25 mg by mouth 2 (two) times daily.   Yes Historical  Provider, MD  mirtazapine (REMERON) 45 MG tablet Take 22.5 mg by mouth at bedtime.   Yes Historical Provider, MD  Nutritional Supplements (ENSURE PO) Take 1 Can by mouth 3 (three) times daily with meals.   Yes Historical Provider, MD  OLANZapine (ZYPREXA) 15 MG tablet Take 15 mg by mouth at bedtime.   Yes Historical Provider, MD  omeprazole (PRILOSEC) 40 MG capsule Take 40 mg by mouth daily.    Yes Historical Provider, MD  phenytoin (DILANTIN) 100 MG ER capsule Take 300 mg by mouth at bedtime.   Yes Historical Provider, MD  polyethylene glycol powder (GLYCOLAX/MIRALAX) powder Take 17 g by mouth daily.   Yes Historical Provider, MD  simvastatin (ZOCOR) 10 MG tablet Take 10 mg by mouth at bedtime.   Yes Historical Provider, MD  solifenacin (VESICARE) 10 MG tablet Take 10 mg by mouth daily.   Yes Historical Provider, MD  sucralfate (CARAFATE) 1 G tablet Take 1 g by mouth 4 (four) times daily.   Yes Historical Provider, MD  traMADol (ULTRAM) 50 MG tablet Take 50 mg by mouth every 6 (six) hours as needed for moderate pain.   Yes Historical Provider, MD  zolpidem (AMBIEN) 5 MG tablet Take 1 tablet (5 mg total) by mouth at bedtime. 07/21/13  Yes Janece Canterbury, MD  acetaminophen (TYLENOL) 325 MG tablet Take 650 mg by mouth every 6 (six) hours as needed. Pain    Historical Provider, MD  ondansetron (ZOFRAN ODT) 4 MG disintegrating tablet Take 1 tablet (4 mg total) by mouth every 8 (eight) hours as needed for nausea. 03/13/13   Velvet Bathe, MD   Allergies  Allergen Reactions  . Fexofenadine Hcl Other (See Comments)    unknown  . Percocet [Oxycodone-Acetaminophen] Other (See Comments)    unknown  . Thorazine [Chlorpromazine Hcl] Other (See Comments)    unknown    FAMILY HISTORY:  History reviewed. No pertinent family history. SOCIAL HISTORY:  reports that he has been smoking Cigarettes.  He has a 40.5 pack-year smoking history. He has never used smokeless tobacco. He reports that he does not drink  alcohol or use illicit drugs.  REVIEW OF SYSTEMS:  All systems reviewed and found negative except for what I mentioned in the HPI.   SUBJECTIVE:   VITAL SIGNS: Temp:  [98.1 F (36.7 C)] 98.1 F (36.7 C) (03/28 2347) Pulse Rate:  [66-118] 100 (03/29 0315) Resp:  [14-29] 25 (03/29 0315) BP: (66-206)/(50-192) 84/61 mmHg (03/29 0315) SpO2:  [84 %-100 %] 84 % (03/29 0315) Weight:  [103 lb 13.4 oz (47.1 kg)] 103 lb 13.4 oz (47.1 kg) (03/29 0213) HEMODYNAMICS:   VENTILATOR SETTINGS:   INTAKE / OUTPUT: Intake/Output     03/28 0701 - 03/29 0700   I.V. (mL/kg) 3000 (63.7)   Total Intake(mL/kg) 3000 (63.7)  Net +3000         PHYSICAL EXAMINATION: General: Ill appearance. Cachectic. No acute distress. Eyes: Anicteric sclerae. ENT: Oropharynx clear. Dry mucous membranes. No thrush Lymph: No cervical, supraclavicular, or axillary lymphadenopathy. Heart: Normal S1, S2. No murmurs, rubs, or gallops appreciated. No bruits, equal pulses. Lungs: Normal excursion, no dullness to percussion. Diminished breath sounds bilaterally. Bilateral wheezing. No crackles. Normal upper airway sounds without evidence of stridor. Abdomen: Abdomen soft, non-tender and not distended, normoactive bowel sounds. No hepatosplenomegaly or masses. Musculoskeletal: No clubbing or synovitis. Skin: No rashes or lesions Neuro: No focal neurologic deficits.  LABS:  CBC  Recent Labs Lab 01/09/14 0005  WBC 18.0*  HGB 11.8*  HCT 34.6*  PLT 385   Coag's No results found for this basename: APTT, INR,  in the last 168 hours BMET  Recent Labs Lab 01/09/14 0005  NA 138  K 4.0  CL 96  CO2 23  BUN 12  CREATININE 1.17  GLUCOSE 182*   Electrolytes  Recent Labs Lab 01/09/14 0005  CALCIUM 8.5   Sepsis Markers  Recent Labs Lab 01/09/14 0020  LATICACIDVEN 4.47*   ABG No results found for this basename: PHART, PCO2ART, PO2ART,  in the last 168 hours Liver Enzymes  Recent Labs Lab  01/09/14 0005  AST 18  ALT 10  ALKPHOS 94  BILITOT 0.4  ALBUMIN 2.6*   Cardiac Enzymes No results found for this basename: TROPONINI, PROBNP,  in the last 168 hours Glucose No results found for this basename: GLUCAP,  in the last 168 hours  Imaging Dg Chest Port 1 View  01/09/2014   CLINICAL DATA:  Dyspnea, history of asthma.  EXAM: PORTABLE CHEST - 1 VIEW  COMPARISON:  DG CHEST 2 VIEW dated 09/12/2013  FINDINGS: Cardiomediastinal silhouette is unremarkable. The lungs are clear without pleural effusions or focal consolidations. Increased lung volumes with mild interstitial prominence Trachea projects midline and there is no pneumothorax. Soft tissue planes and included osseous structures are non-suspicious. Multiple EKG lines overlie the patient and may obscure subtle underlying pathology.  IMPRESSION: Increased lung volumes with mild interstitial prominence may reflect reactive airway disease/bronchiolitis without focal consolidation.   Electronically Signed   By: Elon Alas   On: 01/09/2014 02:25     CXR:  - Emphysema - No acute infiltrates.   ASSESSMENT / PLAN:  PULMONARY A: 1) SOB / COPD, wheezing on exam. Not sure if this is his baseline.  P:   - Supplemental O2 at 2 L [place] - Scheduled Duonebs - Albuterol PRN  CARDIOVASCULAR A:  1) Hypotension. Cannot rule out sepsis. No clear source of infection. High in the differential is adrenal insufficiency. Similar prior admission in late 2014 with cortisol of 4.7 and all infectious workup negative.  2) Elevated lactate P:  -  Started on Dopamine via peripheral IV in the ED -  Has gotten only 2 L of IVF's. Will continue IVF's resuscitation per sepsis protocol and try to avoid central line placement.  - Stress dose steroids. - Repeat lactate in am  RENAL A:   1) No issues P:   - Will follow chemistry  GASTROINTESTINAL A:   1) GERD P:   - Continue home PPI  HEMATOLOGIC A:   1) Chronically elevated WBC 2)  Chronic anemia,unclear etiology. P:  - Will follow CBC  INFECTIOUS A:   1) Hypotension. Cannot rule out sepsis. No clear source.  P:   - Blood, urine and sputum cultures ordered -  Empiric Zosyn and vancomycin  ENDOCRINE A:   1) Hypotension likely related to adrenal insufficiency 2) Hyperglycemia 3) Hypothyroidism P:   - Stress dose steroids - Consider endocrinology consult in am  - Novolog sliding scale. - Continue synthroid   NEUROLOGIC A:   1) Altered mental status. Unclear what is his baseline.  2) Schizoaffective disorder 3) Seizure disorder P:   - Will hold alprazolam, Doxepin, Prozac, remeron, zyprexa, ambien, benztropine. - Continue dilantin  TODAY'S SUMMARY:  I have personally obtained a history, examined the patient, evaluated laboratory and imaging results, formulated the assessment and plan and placed orders. CRITICAL CARE: The patient is critically ill with multiple organ systems failure and requires high complexity decision making for assessment and support, frequent evaluation and titration of therapies, application of advanced monitoring technologies and extensive interpretation of multiple databases. Critical Care Time devoted to patient care services described in this note is 60 minutes.   Waynetta Pean, MD Pulmonary and [place] Pager: [phone]  01/09/2014, 3:48 AM

## 2014-01-09 NOTE — ED Notes (Signed)
Dr Roxanne Mins given a copy of lactic acid results 4.47

## 2014-01-09 NOTE — Progress Notes (Signed)
eLink Physician-Brief Progress Note Patient Name: Corrion Stirewalt DOB: October 10, 1971 MRN: [id number]  Date of Service  01/09/2014   HPI/Events of Note   Ca 5.9 Mg 1.4   eICU Interventions   Ca 1 Mg 2    Intervention Category Major Interventions: Electrolyte abnormality - evaluation and management  Jonea Bukowski 01/09/2014, 6:24 AM

## 2014-01-09 NOTE — Progress Notes (Signed)
ANTIBIOTIC CONSULT NOTE - INITIAL  Pharmacy Consult for vancomycin and zosyn Indication: rule out sepsis  Allergies  Allergen Reactions  . Fexofenadine Hcl Other (See Comments)    unknown  . Percocet [Oxycodone-Acetaminophen] Other (See Comments)    unknown  . Thorazine [Chlorpromazine Hcl] Other (See Comments)    unknown    Patient Measurements: Height: 6' 0.05" (183 cm) Weight: 103 lb 13.4 oz (47.1 kg) (copied from Oct 2014 records) IBW/kg (Calculated) : 77.71   Vital Signs: Temp: 98.1 F (36.7 C) (03/28 2347) Temp src: Rectal (03/28 2347) BP: 84/61 mmHg (03/29 0315) Pulse Rate: 100 (03/29 0315) Intake/Output from previous day: 03/28 0701 - 03/29 0700 In: 3000 [I.V.:3000] Out: -  Intake/Output from this shift: Total I/O In: 3000 [I.V.:3000] Out: -   Labs:  Recent Labs  01/09/14 0005  WBC 18.0*  HGB 11.8*  PLT 385  CREATININE 1.17   Estimated Creatinine Clearance: 54.2 ml/min (by C-G formula based on Cr of 1.17). No results found for this basename: VANCOTROUGH, VANCOPEAK, VANCORANDOM, GENTTROUGH, GENTPEAK, GENTRANDOM, TOBRATROUGH, TOBRAPEAK, TOBRARND, AMIKACINPEAK, AMIKACINTROU, AMIKACIN,  in the last 72 hours   Microbiology: No results found for this or any previous visit (from the past 720 hour(s)).  Medical History: Past Medical History  Diagnosis Date  . Chronic pain   . Anxiety   . Schizoaffective disorder   . Hearing loss   . Seizure   . Arthritis   . Asthma   . Foot drop   . Anemia   . Dehydration   . Hypotension   . GERD (gastroesophageal reflux disease)   . Hypothyroidism   . Constipation   . Polysubstance abuse   . Cancer   . Leukocytosis     Medications:  See med rec Assessment: 43 yo man to start broad spectrum antibiotics for sepsis.  He is cachetic, SrCr not indicative of renal function.  However is is relatively young.  WBC 18.0, Afeb, LA 4.47  Vanc 1 gm and zosyn 3.375 gm ordered in ED  Goal of Therapy:  Vancomycin  trough level 15-20 mcg/ml  Plan:  Zosyn 3.375 gm IV q8 hours. Vancomycin 750 mg IV q24 hours. F/u cultures, clinical progress and VT when appropriate.   [name] [name] 01/09/2014,3:47 AM

## 2014-01-09 NOTE — ED Notes (Signed)
Dr. Roxanne Mins informed of Pt low BP. Order for bolus received

## 2014-01-09 NOTE — Progress Notes (Signed)
Mrsa swab positive, protocol started

## 2014-01-09 NOTE — Progress Notes (Signed)
[facility] PCCM  Mr. Matters has not done well since admission.  His shock has remained stable but his acidosis and work of breathing have worsened.  CXR from this morning showed signs of pneumonia.    I contacted his nursing facility. At baseline he is conversant and "very social" at his nursing home.  He doesn't walk.  There is not family.  He has been in a nursing home for 3 years (or more) and the nursing home folks aren't really sure why he is there other than his inability to walk.    This week he had no new medicines and felt OK until yesterday.    Filed Vitals:   01/09/14 0923 01/09/14 0930 01/09/14 0945 01/09/14 1215  BP:  97/74 109/62   Pulse:      Temp:      TempSrc:      Resp:  21 23   Height:    6\' 2"  (1.88 m)  Weight:      SpO2: 100%      Gen: marked resp distress, confused HEENT: Dry mouth, PULM: Crackles R CV: Tachy, regular AB: BS+, soft, nontender Ext: cool, no edema Neuro: focused on asking for water, not oriented to situation or place, moves arms without difficulty, weakness bilateral lower ext  Impression 1) Shock> presumed septic shock from HCAP 2) HCAP 3) Relative Adrenal insufficiency 4) Oliguria 5) Acute encephalopathy 6) worsening gap acidosis > worsening lactic acid? Ketosis?   Plan: 1) volume resuscitate more aggressively (one liter now, then follow CVP) 2) bicarb gtt 3) check serum lactic acid, ketones 4) CVP monitoring 5) levophed for MAP > 65 (out of EGDT window) 6) continue solucortef 7) intubate, mechanically venilate 8) repeat coox 9) fentanyl gtt for sedation  Additional CC time 80 minutes.  Jillyn Hidden PCCM Pager: [phone] Cell: [phone] If no response, call [phone]

## 2014-01-09 NOTE — Procedures (Signed)
Intubation Procedure Note Keland Peyton [id number] 1971/08/15  Procedure: Intubation Indications: Airway protection and maintenance  Procedure Details Consent: Unable to obtain consent because of altered level of consciousness. Time Out: Verified patient identification, verified procedure, site/side was marked, verified correct patient position, special equipment/implants available, medications/allergies/relevent history reviewed, required imaging and test results available.  Performed  Drugs Versed 2mg , Fentanyl 71mcg, Etomidate 20mg , Rocuronium 50mg  DL x 1 with MAC 3 blade Grade 2 view 7.5 tube passed through cords under direct visualization Placement confirmed with bilateral breath sounds, positive EtCO2 change and smoke in tube   Evaluation Hemodynamic Status: BP stable throughout; O2 sats: stable throughout Patient's Current Condition: stable Complications: No apparent complications Patient did tolerate procedure well. Chest X-ray ordered to verify placement.  CXR: pending.   Simonne Maffucci 01/09/2014

## 2014-01-09 NOTE — Progress Notes (Signed)
RT Note: ABG was done on patient on 3 lpm nasal cannula. Patient tolerated the procedure well. Rt to continue to monitor. Results pending.

## 2014-01-09 NOTE — Progress Notes (Signed)
CRITICAL VALUE ALERT  Critical value received: Ca 5.9  Date of notification: 01/09/2014  Time of notification:0530  Critical value read back:yes  Nurse who received alert:  Berniece Andreas RN  MD notified (1st page): dr. Oliver Pila  Time of first page:  25  MD notified (2nd page):  Time of second page:  Responding MD: Dr Earnest Conroy  Time MD responded: Dr.[email or web address]

## 2014-01-10 DIAGNOSIS — R0602 Shortness of breath: Secondary | ICD-10-CM | POA: Diagnosis not present

## 2014-01-10 DIAGNOSIS — A4102 Sepsis due to Methicillin resistant Staphylococcus aureus: Secondary | ICD-10-CM | POA: Diagnosis not present

## 2014-01-10 DIAGNOSIS — R6521 Severe sepsis with septic shock: Secondary | ICD-10-CM

## 2014-01-10 DIAGNOSIS — A419 Sepsis, unspecified organism: Secondary | ICD-10-CM

## 2014-01-10 DIAGNOSIS — E43 Unspecified severe protein-calorie malnutrition: Secondary | ICD-10-CM | POA: Insufficient documentation

## 2014-01-10 LAB — URINE CULTURE
COLONY COUNT: NO GROWTH
CULTURE: NO GROWTH

## 2014-01-10 LAB — GLUCOSE, CAPILLARY
GLUCOSE-CAPILLARY: 215 mg/dL — AB (ref 70–99)
GLUCOSE-CAPILLARY: 168 mg/dL — AB (ref 70–99)
GLUCOSE-CAPILLARY: 123 mg/dL — AB (ref 70–99)
GLUCOSE-CAPILLARY: 116 mg/dL — AB (ref 70–99)
GLUCOSE-CAPILLARY: 96 mg/dL (ref 70–99)
GLUCOSE-CAPILLARY: 75 mg/dL (ref 70–99)
GLUCOSE-CAPILLARY: 79 mg/dL (ref 70–99)
Glucose-Capillary: 204 mg/dL — ABNORMAL HIGH (ref 70–99)
Glucose-Capillary: 245 mg/dL — ABNORMAL HIGH (ref 70–99)
Glucose-Capillary: 89 mg/dL (ref 70–99)

## 2014-01-10 LAB — BASIC METABOLIC PANEL
BUN: 22 mg/dL (ref 6–23)
CHLORIDE: 101 meq/L (ref 96–112)
CO2: 26 mEq/L (ref 19–32)
Calcium: 5.7 mg/dL — CL (ref 8.4–10.5)
Creatinine, Ser: 2.36 mg/dL — ABNORMAL HIGH (ref 0.50–1.35)
GFR calc non Af Amer: 32 mL/min — ABNORMAL LOW (ref >90)
GFR, EST AFRICAN AMERICAN: 37 mL/min — AB (ref >90)
Glucose, Bld: 77 mg/dL (ref 70–99)
POTASSIUM: 3.2 meq/L — AB (ref 3.7–5.3)
Sodium: 144 mEq/L (ref 137–147)

## 2014-01-10 MED ORDER — PHENYTOIN SODIUM 50 MG/ML IJ SOLN
100.0000 mg | Freq: Three times a day (TID) | INTRAMUSCULAR | Status: DC
  Administered 2014-01-10 – 2014-01-12 (×7): 100 mg via INTRAVENOUS
  Filled 2014-01-10 (×9): qty 2

## 2014-01-10 MED ORDER — HYDROCORTISONE NA SUCCINATE PF 100 MG IJ SOLR
50.0000 mg | Freq: Three times a day (TID) | INTRAMUSCULAR | Status: DC
  Administered 2014-01-10 – 2014-01-11 (×2): 50 mg via INTRAVENOUS
  Filled 2014-01-10 (×5): qty 1

## 2014-01-10 MED ORDER — MIDAZOLAM HCL 2 MG/2ML IJ SOLN
2.0000 mg | INTRAMUSCULAR | Status: DC | PRN
  Administered 2014-01-13 (×2): 2 mg via INTRAVENOUS

## 2014-01-10 MED ORDER — IPRATROPIUM-ALBUTEROL 0.5-2.5 (3) MG/3ML IN SOLN
3.0000 mL | Freq: Four times a day (QID) | RESPIRATORY_TRACT | Status: DC
  Administered 2014-01-10 – 2014-01-25 (×59): 3 mL via RESPIRATORY_TRACT
  Filled 2014-01-10 (×59): qty 3

## 2014-01-10 MED ORDER — MIDAZOLAM HCL 2 MG/2ML IJ SOLN
2.0000 mg | INTRAMUSCULAR | Status: DC | PRN
  Administered 2014-01-10: 2 mg via INTRAVENOUS
  Filled 2014-01-10 (×5): qty 2

## 2014-01-10 MED ORDER — LEVOTHYROXINE SODIUM 75 MCG PO TABS
75.0000 ug | ORAL_TABLET | Freq: Every day | ORAL | Status: DC
  Administered 2014-01-11 – 2014-01-18 (×8): 75 ug
  Filled 2014-01-10 (×9): qty 1

## 2014-01-10 MED ORDER — FREE WATER
100.0000 mL | Freq: Three times a day (TID) | Status: DC
  Administered 2014-01-10 – 2014-01-16 (×18): 100 mL

## 2014-01-10 MED ORDER — FAMOTIDINE IN NACL 20-0.9 MG/50ML-% IV SOLN
20.0000 mg | Freq: Two times a day (BID) | INTRAVENOUS | Status: DC
  Administered 2014-01-10: 20 mg via INTRAVENOUS
  Filled 2014-01-10 (×3): qty 50

## 2014-01-10 MED ORDER — POTASSIUM CHLORIDE 20 MEQ/15ML (10%) PO LIQD
20.0000 meq | Freq: Once | ORAL | Status: AC
  Administered 2014-01-10: 20 meq
  Filled 2014-01-10: qty 15

## 2014-01-10 MED ORDER — SODIUM CHLORIDE 0.9 % IV SOLN
INTRAVENOUS | Status: DC
  Administered 2014-01-10: 1.4 [IU]/h via INTRAVENOUS
  Filled 2014-01-10: qty 1

## 2014-01-10 MED ORDER — INSULIN ASPART 100 UNIT/ML ~~LOC~~ SOLN
0.0000 [IU] | SUBCUTANEOUS | Status: DC
  Administered 2014-01-11 (×3): 2 [IU] via SUBCUTANEOUS
  Administered 2014-01-12: 05:00:00 via SUBCUTANEOUS

## 2014-01-10 MED ORDER — PHENYTOIN 125 MG/5ML PO SUSP: 100.0000 mg | Freq: Three times a day (TID) | ORAL | Status: DC

## 2014-01-10 MED ORDER — SODIUM CHLORIDE 0.9 % IV SOLN
1.0000 g | Freq: Once | INTRAVENOUS | Status: AC
  Administered 2014-01-10: 1 g via INTRAVENOUS
  Filled 2014-01-10: qty 10

## 2014-01-10 MED ORDER — DEXMEDETOMIDINE HCL IN NACL 200 MCG/50ML IV SOLN
0.0000 ug/kg/h | INTRAVENOUS | Status: DC
  Administered 2014-01-11: 1.097 ug/kg/h via INTRAVENOUS
  Administered 2014-01-11: 1.1 ug/kg/h via INTRAVENOUS
  Administered 2014-01-11 (×2): 1.097 ug/kg/h via INTRAVENOUS
  Administered 2014-01-11: 0.2 ug/kg/h via INTRAVENOUS
  Administered 2014-01-11: 0.5 ug/kg/h via INTRAVENOUS
  Administered 2014-01-12: 1.1 ug/kg/h via INTRAVENOUS
  Administered 2014-01-12 (×3): 1 ug/kg/h via INTRAVENOUS
  Administered 2014-01-12: 0.8 ug/kg/h via INTRAVENOUS
  Administered 2014-01-13: 1.2 ug/kg/h via INTRAVENOUS
  Administered 2014-01-13: 1 ug/kg/h via INTRAVENOUS
  Filled 2014-01-10 (×6): qty 50
  Filled 2014-01-10: qty 100
  Filled 2014-01-10 (×3): qty 50
  Filled 2014-01-10: qty 100
  Filled 2014-01-10 (×2): qty 50

## 2014-01-10 NOTE — Progress Notes (Deleted)
Admit: 43 yo, r/o sepsis. He is cachetic, SrCr not indicative of renal function. PMH: schizoaffective disorder, polysubstance abuse, seizures, COPD, malnutrtion, GERD  Anticoagulation: Heparin 5k Infectious Disease: No clear source of infection per CCM. WBC 36, Tmax 100.1.  3/29 Urine>> 3/29 Blood>>ngtd 3/29 Sputum not drawn  3/29 Vanc>> 3/29 Zosyn>>  Cardiovascular: HTN - Norepi @ 5, DA off. BP soft. ST.  Endocrinology: Hypothyroidism - on syntroid. CBGs trending down on Insulin gtt 2.2 units/hr >> off. Stress steroids. Gastrointestinal / Nutrition: GERD - NPO, Pepcid IV (PPI PTA) Neurology: Anxiety, Sz, AMS - On DPH 300 qHS (~5.8mg /kg/d). Fent @ 75. RASS 1-2.   3/29 PHY level  7.9 -corrects to 12.7 (goal 10-20).   Nephrology: Scr 1.17, lytes ok; Bicarb @ 125cc/hr. Low UOP (net +).  Pulmonary: Asthma - Duoneb, Intubated- FiO2 40% Hematology / Oncology: H/H low stable, Plts drop from 385>>151 in < 8hr?  PTA Medication Issues: xanax 0.5mg  daily: Cogentin 1mg  AM, 0.5mg  HS; doxepin 50mg  HS; Prozac 10mg  qd; Remeron 22.5mg  HS; Zyprexa 15mg  HS; Prn Tramadol, Prn Ambien Best Practices; hep, H2 blocker IV  Plan:   Zosyn 3.375 gm IV q8 hours. Vancomycin 750 mg IV q24 hours (consider incr to 500 q12?) F/u cultures, clinical progress and VT when appropriate. Watch plts Stop Bicarb? K wnl Nutrition?

## 2014-01-10 NOTE — Procedures (Signed)
Central Venous Catheter Insertion Procedure Note Norberto Wishon [id number] 10/13/1971  Procedure: Insertion of Central Venous Catheter Indications: Assessment of intravascular volume, Drug and/or fluid administration and Frequent blood sampling  Procedure Details Consent: Unable to obtain consent because of emergent medical necessity. Time Out: Verified patient identification, verified procedure, site/side was marked, verified correct patient position, special equipment/implants available, medications/allergies/relevent history reviewed, required imaging and test results available.  Performed  Maximum sterile technique was used including antiseptics, cap, gloves, gown, hand hygiene, mask and sheet. Skin prep: Chlorhexidine; local anesthetic administered A antimicrobial bonded/coated triple lumen catheter was placed in the left internal jugular vein using the Seldinger technique.  Evaluation Blood flow good Complications: No apparent complications Patient did tolerate procedure well. Chest X-ray ordered to verify placement.  CXR: normal.  SIQUEIROS, Hung Rhinesmith 01/10/2014, 4:13 AM

## 2014-01-10 NOTE — Clinical Documentation Improvement (Signed)
   PCCM MD's, NP's, and PA's    Medicare rules require specification as to whether an inpatient diagnosis was present at the time of admission.    Per documentation "presumed septic shock from HCAP"  BP 80/54  Pulse 118  Temp(Src) 98.1 F (36.7 C) (Rectal) Resp 29 WBC 18.0 on admission.  If the following condition is appropriate please document in notes.  Thank you  Please clarify if the following diagnosis  Sepsis was:       Present at the time of admission   NOT present at the time of inpatient admission and it developed during the inpatient stay   Unable to clinically determine whether the condition was present on admission.   Documentation insufficient to determine if condition was present at the time of inpatient admission   ALSO   Per Nutritional consult note patients meet criteria for "severe malnutrition/underweight with BMI of 14.54 ..evidenced by severe fat and muscle wasting. Please document clinical condition if appropriate.   Thank you   Possible Clinical Conditions:   Moderate Malnutrition  Severe Malnutrition  Protein Calorie Malnutrition  Severe Protein Calorie Malnutrition  Other Clinical Condition  Cannot Determine     Thank You, Ree Kida ,RN Clinical Documentation Specialist:  [facility]

## 2014-01-10 NOTE — Progress Notes (Signed)
eLink Physician-Brief Progress Note Patient Name: [name] [name] DOB: April 27, 1971 MRN: [id number]  Date of Service  01/10/2014   HPI/Events of Note    RNc allling elink  RASS +4 confirmed on camera exam. Currntly on fentanyl gtt  eICU Interventions  Add precedex gtt   Intervention Category Major Interventions: Delirium, psychosis, severe agitation - evaluation and management  Shaterica Mcclatchy 01/10/2014, 11:47 PM

## 2014-01-10 NOTE — Progress Notes (Signed)
CRITICAL VALUE ALERT  Critical value received:  Ca 5.7  Date of notification:  01-10-14  Time of notification:  1658  Critical value read back:yes  Nurse who received alert:  Rosine Door, RN  MD notified (1st ): Dr. Oliver Pila  Time of first page:  1658  MD notified (2nd page):  Time of second page:  Responding MD:  Dr. Oliver Pila  Time MD responded:  [phone]

## 2014-01-10 NOTE — Progress Notes (Signed)
PULMONARY / CRITICAL CARE MEDICINE   Name: [name] [name] MRN: [id number] DOB: 1970-12-06    ADMISSION DATE:  12/22/2013  PRIMARY SERVICE: PCCM  CHIEF COMPLAINT:  SOB, leg pain.  BRIEF PATIENT DESCRIPTION:  43 years old male resident of an assisted living facility with PMH relevant for schizoaffective disorder, polysubstance abuse, seizures, COPD,  malnutrition, GERD. Presents with SOB and found to be hypotensive despite 2 L of IVF's. Started on dopamine via peripheral IV by ED and  PCCM called to admit. No clear source of infection.   SIGNIFICANT EVENTS / STUDIES:     LINES / TUBES: L IJ CVL 3/29 >>  ETT 3/29 >>    CULTURES: MRSA PCR 3/29 >> POS Urine 3/29 >>  Blood 3/29 >>   ANTIBIOTICS: Vanc 3/29 >>  Pip-tazo 3/29 >>    SUBJECTIVE:  RASS -3. Not F/C.    VITAL SIGNS: Temp:  [94.6 F (34.8 C)-100.2 F (37.9 C)] 98.7 F (37.1 C) (03/30 1645) Pulse Rate:  [82-135] 135 (03/30 1645) Resp:  [16-28] 25 (03/30 1645) BP: (76-141)/(50-96) 140/74 mmHg (03/30 1645) SpO2:  [76 %-100 %] 76 % (03/30 1645) FiO2 (%):  [40 %-50 %] 40 % (03/30 1616) Weight:  [51.4 kg (113 lb 5.1 oz)] 51.4 kg (113 lb 5.1 oz) (03/30 0424) HEMODYNAMICS: CVP:  [6 mmHg-24 mmHg] 24 mmHg VENTILATOR SETTINGS: Vent Mode:  [-] PRVC FiO2 (%):  [40 %-50 %] 40 % Set Rate:  [16 bmp-20 bmp] 16 bmp Vt Set:  [600 mL-660 mL] 600 mL PEEP:  [5 cmH20] 5 cmH20 Plateau Pressure:  [20 cmH20-26 cmH20] 26 cmH20 INTAKE / OUTPUT: Intake/Output     03/29 0701 - 03/30 0700 03/30 0701 - 03/31 0700   I.V. (mL/kg) 4077.9 (79.3) 772.9 (15)   IV Piggyback 1497.5 112.5   Total Intake(mL/kg) 5575.4 (108.5) 885.4 (17.2)   Urine (mL/kg/hr) 343 (0.3)    Total Output 343     Net +5232.4 +885.4          PHYSICAL EXAMINATION: General: NAD, Not F/C, sedated HEENT: WNL Cardiac: RRR s M Lungs: No wheezes noted Abdomen: Soft, diminished BS Ext: Cool, no edema Neuro: No focal neurologic deficits.  LABS: I have reviewed  all of today's lab results. Relevant abnormalities are discussed in the A/P section  CXR: NNF   ASSESSMENT / PLAN:  PULMONARY A: Acute resp failure COPD - emphysema P:   Cont full vent support - settings reviewed and/or adjusted Cont vent bundle Daily SBT if/when meets criteria   CARDIOVASCULAR A:  Septic shock P:  Cont pressors to maintain MAP > 65 mmHg Cont IVFs to maintain CVP 10-14  RENAL A:   Acute kidney failure, oligo-anuric Metabolic acidosis P:   Monitor BMET intermittently Monitor I/Os Correct electrolytes as indicated DC HCO3 infusion 3/30  GASTROINTESTINAL A:   H/O GERD, chronic PPI use P:   SUP: IV famotidine Consider TFs 3/31  HEMATOLOGIC A:   Anemia without acute blood loss  P:  DVT Px: SQ heparin Monitor CBC intermittently Transfuse per usual ICU guidelines  INFECTIOUS A:   Severe sepsis RLL HCAP P:   Micro and abx as above  ENDOCRINE A:   Hypothyroidism Possible relative adrenal insufficiency - timing of cortisol blood draw unclear P:   Cont stress dose steroids Cont L-thyroxine Cont SSI   NEUROLOGIC A:   Acute encephalopathy Schizoaffective disorder Seizure disorder P:   Sedation protocol Cont AEDs - changed to IV   TODAY'S SUMMARY:  I have personally  obtained a history, examined the patient, evaluated laboratory and imaging results, formulated the assessment and plan and placed orders. CRITICAL CARE: The patient is critically ill with multiple organ systems failure and requires high complexity decision making for assessment and support, frequent evaluation and titration of therapies, application of advanced monitoring technologies and extensive interpretation of multiple databases. Critical Care Time devoted to patient care services described in this note is 40 minutes.   Merton Border, MD ; [facility] [phone].  After 5:30 PM or weekends, call [phone]   01/10/2014, 5:01 PM

## 2014-01-10 NOTE — Progress Notes (Signed)
INITIAL NUTRITION ASSESSMENT  Pt meets criteria for SEVERE MALNUTRITION in the context of chronic illness as evidenced by severe fat and muscle wasting.  DOCUMENTATION CODES Per approved criteria  -Severe malnutrition in the context of chronic illness -Underweight   INTERVENTION: Once stable, recommend initiating the adult TF protocol utilizing the 5M PEPuP Pilot arm.     -  Osmolite 1.2 goal rate of 55 ml/hr   (1320 ml 24 hours, 1684 kcal (101% of needs), 88 grams protein, 1070 ml H2O)    - 30 ml Prostat daily    - Recommend monitor magnesium, potassium, and phosphorus daily for at least 3 days, MD to replete as needed, as pt is at risk for refeeding syndrome given severe malnutrition (monitored if PEPuP pilot utilized).   NUTRITION DIAGNOSIS: Inadequate oral intake related to inability to eat as evidenced by NPO status  Goal: Pt to meet >/= 90% of their estimated nutrition needs   Monitor:  Vent status, TF initiation and tolerance  Reason for Assessment: Ventilator  43 y.o. male  Admitting Dx: <principal problem not specified>  ASSESSMENT: PMHx significant for schizoaffective disorder, HOH, polysubstance abuse, constipation. From facility. Pt is wheel-chair bound, per hx pt is very conversant and social at his facility.   Patient is currently intubated on ventilator support with shock suspected from HCAP. Pt worsening per MD note plan for aggressive volume resuscitation.  MV: 13.5 L/min Temp (24hrs), Avg:97.1 F (36.2 C), Min:92.1 F (33.4 C), Max:100.2 F (37.9 C)  Lower body wasting likely from being wheel-chair bound, upper body wasting likely better reflects nutrition status.   Nutrition Focused Physical Exam:  Subcutaneous Fat:  Orbital Region: severe wasting Upper Arm Region: severe wasting Thoracic and Lumbar Region: severe wasting  Muscle:  Temple Region: severe wasting Clavicle Bone Region: severe wasting Clavicle and Acromion Bone Region: severe  wasting Scapular Bone Region: NA Dorsal Hand: NA Patellar Region: severe wasting Anterior Thigh Region: severe wasting Posterior Calf Region: severe wasting  Edema: not present   Height: Ht Readings from Last 1 Encounters:  01/09/14 6\' 2"  (1.88 m)    Weight: Wt Readings from Last 1 Encounters:  01/10/14 113 lb 5.1 oz (51.4 kg)    Ideal Body Weight: 86.3 kg   % Ideal Body Weight: 60%  Wt Readings from Last 10 Encounters:  01/10/14 113 lb 5.1 oz (51.4 kg)  07/21/13 103 lb 13.4 oz (47.1 kg)  03/09/13 92 lb 9.5 oz (42 kg)    Usual Body Weight: 92-103 lb   % Usual Body Weight: >100%  BMI:  Body mass index is 14.54 kg/(m^2).  Estimated Nutritional Needs: Kcal: 9983 Protein: 80-95 grams Fluid: >1.6 L/day  Skin: no issues noted  Diet Order: NPO  EDUCATION NEEDS: -No education needs identified at this time   Intake/Output Summary (Last 24 hours) at 01/10/14 0947 Last data filed at 01/10/14 0800  Gross per 24 hour  Intake 5636.93 ml  Output    343 ml  Net 5293.93 ml    Last BM: PTA   Labs:   Recent Labs Lab 01/09/14 0358 01/09/14 0817 01/09/14 1123  NA 142 140 141  K 4.1 5.1 4.2  CL 105 107 109  CO2 15* 14* 16*  BUN 11 13 14   CREATININE 0.90 1.17 1.16  CALCIUM 5.9* 6.7* 6.4*  MG 1.4*  --   --   PHOS 4.4  --   --   GLUCOSE 120* 132* 139*    CBG (last 3)  Recent Labs  01/09/14 1949 01/09/14 2356 01/10/14 0401  GLUCAP 230* 245* 204*   No results found for this basename: HGBA1C   Scheduled Meds: . antiseptic oral rinse  15 mL Mouth Rinse QID  . chlorhexidine  15 mL Mouth Rinse BID  . Chlorhexidine Gluconate Cloth  6 each Topical Q0600  . famotidine (PEPCID) IV  20 mg Intravenous Q12H  . heparin  5,000 Units Subcutaneous 3 times per day  . hydrocortisone sod succinate (SOLU-CORTEF) inj  100 mg Intravenous Q8H  . influenza vac split quadrivalent PF  0.5 mL Intramuscular Tomorrow-1000  . ipratropium-albuterol  3 mL Nebulization Q4H  .  levothyroxine  75 mcg Oral QAC breakfast  . mupirocin ointment  1 application Nasal BID  . phenytoin  300 mg Oral QHS  . piperacillin-tazobactam (ZOSYN)  IV  3.375 g Intravenous Q8H  . pneumococcal 23 valent vaccine  0.5 mL Intramuscular Tomorrow-1000  . vancomycin  750 mg Intravenous Q24H    Continuous Infusions: . DOPamine Stopped (01/09/14 0600)  . fentaNYL infusion INTRAVENOUS 75 mcg/hr (01/10/14 0800)  . insulin (NOVOLIN-R) infusion 0.6 Units/hr (01/10/14 0913)  . norepinephrine (LEVOPHED) Adult infusion 5 mcg/min (01/10/14 0800)  .  sodium bicarbonate  infusion 1000 mL 125 mL/hr at 01/10/14 5681    Past Medical History  Diagnosis Date  . Chronic pain   . Anxiety   . Schizoaffective disorder   . Hearing loss   . Seizure   . Arthritis   . Asthma   . Foot drop   . Anemia   . Dehydration   . Hypotension   . GERD (gastroesophageal reflux disease)   . Hypothyroidism   . Constipation   . Polysubstance abuse   . Cancer   . Leukocytosis     History reviewed. No pertinent past surgical history.  [place], [place], [place] Pager [phone] After Hours Pager

## 2014-01-11 ENCOUNTER — Inpatient Hospital Stay (HOSPITAL_COMMUNITY): Payer: Medicare Other

## 2014-01-11 DIAGNOSIS — J189 Pneumonia, unspecified organism: Secondary | ICD-10-CM

## 2014-01-11 DIAGNOSIS — N179 Acute kidney failure, unspecified: Secondary | ICD-10-CM

## 2014-01-11 LAB — COMPREHENSIVE METABOLIC PANEL
ALBUMIN: 1.9 g/dL — AB (ref 3.5–5.2)
ALK PHOS: 118 U/L — AB (ref 39–117)
ALT: 2385 U/L — ABNORMAL HIGH (ref 0–53)
AST: 3622 U/L — AB (ref 0–37)
BILIRUBIN TOTAL: 0.6 mg/dL (ref 0.3–1.2)
BUN: 26 mg/dL — ABNORMAL HIGH (ref 6–23)
CHLORIDE: 99 meq/L (ref 96–112)
CO2: 23 mEq/L (ref 19–32)
Calcium: 6.2 mg/dL — CL (ref 8.4–10.5)
Creatinine, Ser: 2.88 mg/dL — ABNORMAL HIGH (ref 0.50–1.35)
GFR calc Af Amer: 29 mL/min — ABNORMAL LOW (ref >90)
GFR calc non Af Amer: 25 mL/min — ABNORMAL LOW (ref >90)
Glucose, Bld: 122 mg/dL — ABNORMAL HIGH (ref 70–99)
POTASSIUM: 3.8 meq/L (ref 3.7–5.3)
Sodium: 142 mEq/L (ref 137–147)
TOTAL PROTEIN: 4.6 g/dL — AB (ref 6.0–8.3)

## 2014-01-11 LAB — CBC
HCT: 29.7 % — ABNORMAL LOW (ref 39.0–52.0)
Hemoglobin: 10.3 g/dL — ABNORMAL LOW (ref 13.0–17.0)
MCH: 30.1 pg (ref 26.0–34.0)
MCHC: 34.7 g/dL (ref 30.0–36.0)
MCV: 86.8 fL (ref 78.0–100.0)
PLATELETS: 108 10*3/uL — AB (ref 150–400)
RBC: 3.42 MIL/uL — ABNORMAL LOW (ref 4.22–5.81)
RDW: 14.5 % (ref 11.5–15.5)
WBC: 31.9 10*3/uL — AB (ref 4.0–10.5)

## 2014-01-11 LAB — GLUCOSE, CAPILLARY
GLUCOSE-CAPILLARY: 107 mg/dL — AB (ref 70–99)
GLUCOSE-CAPILLARY: 111 mg/dL — AB (ref 70–99)
GLUCOSE-CAPILLARY: 126 mg/dL — AB (ref 70–99)
GLUCOSE-CAPILLARY: 131 mg/dL — AB (ref 70–99)
Glucose-Capillary: 116 mg/dL — ABNORMAL HIGH (ref 70–99)
Glucose-Capillary: 133 mg/dL — ABNORMAL HIGH (ref 70–99)

## 2014-01-11 LAB — PHOSPHORUS: PHOSPHORUS: 6.1 mg/dL — AB (ref 2.3–4.6)

## 2014-01-11 LAB — URINE DRUGS OF ABUSE SCREEN W ALC, ROUTINE (REF LAB)
AMPHETAMINE SCRN UR: NEGATIVE
BENZODIAZEPINES.: NEGATIVE
Barbiturate Quant, Ur: POSITIVE — AB
CREATININE, U: 216.2 mg/dL
Cocaine Metabolites: NEGATIVE
MARIJUANA METABOLITE: NEGATIVE
Methadone: NEGATIVE
OPIATE SCREEN, URINE: NEGATIVE
Phencyclidine (PCP): NEGATIVE
Propoxyphene: NEGATIVE

## 2014-01-11 LAB — MAGNESIUM: Magnesium: 2.1 mg/dL (ref 1.5–2.5)

## 2014-01-11 MED ORDER — PRO-STAT SUGAR FREE PO LIQD
30.0000 mL | Freq: Every day | ORAL | Status: DC
  Administered 2014-01-12 – 2014-01-17 (×6): 30 mL
  Filled 2014-01-11 (×8): qty 30

## 2014-01-11 MED ORDER — VITAL HIGH PROTEIN PO LIQD
1000.0000 mL | ORAL | Status: DC
  Filled 2014-01-11 (×2): qty 1000

## 2014-01-11 MED ORDER — VANCOMYCIN HCL 500 MG IV SOLR
500.0000 mg | INTRAVENOUS | Status: DC
  Administered 2014-01-12: 500 mg via INTRAVENOUS
  Filled 2014-01-11: qty 500

## 2014-01-11 MED ORDER — PRO-STAT SUGAR FREE PO LIQD
30.0000 mL | Freq: Two times a day (BID) | ORAL | Status: DC
  Administered 2014-01-11: 30 mL
  Filled 2014-01-11 (×2): qty 30

## 2014-01-11 MED ORDER — OLANZAPINE 7.5 MG PO TABS
15.0000 mg | ORAL_TABLET | Freq: Every day | ORAL | Status: DC
  Administered 2014-01-11 – 2014-01-16 (×6): 15 mg
  Filled 2014-01-11 (×7): qty 2

## 2014-01-11 MED ORDER — BENZTROPINE MESYLATE 1 MG PO TABS
1.0000 mg | ORAL_TABLET | Freq: Every day | ORAL | Status: DC
  Administered 2014-01-11 – 2014-01-16 (×6): 1 mg via ORAL
  Filled 2014-01-11 (×7): qty 1

## 2014-01-11 MED ORDER — BENZTROPINE MESYLATE 0.5 MG PO TABS: 0.5000 mg | ORAL_TABLET | ORAL | Status: DC

## 2014-01-11 MED ORDER — FAMOTIDINE IN NACL 20-0.9 MG/50ML-% IV SOLN
20.0000 mg | INTRAVENOUS | Status: DC
  Administered 2014-01-11: 20 mg via INTRAVENOUS
  Filled 2014-01-11: qty 50

## 2014-01-11 MED ORDER — BENZTROPINE MESYLATE 0.5 MG PO TABS
0.5000 mg | ORAL_TABLET | Freq: Every day | ORAL | Status: DC
  Administered 2014-01-11 – 2014-01-16 (×6): 0.5 mg via ORAL
  Filled 2014-01-11 (×8): qty 1

## 2014-01-11 MED ORDER — OSMOLITE 1.2 CAL PO LIQD
1000.0000 mL | ORAL | Status: DC
  Administered 2014-01-11 – 2014-01-15 (×5): 1000 mL
  Filled 2014-01-11 (×15): qty 1000

## 2014-01-11 MED ORDER — HALOPERIDOL LACTATE 5 MG/ML IJ SOLN: 1.0000 mg | INTRAMUSCULAR | Status: DC | PRN

## 2014-01-11 MED ORDER — MIRTAZAPINE 30 MG PO TABS
30.0000 mg | ORAL_TABLET | Freq: Every day | ORAL | Status: DC
  Administered 2014-01-11 – 2014-01-16 (×6): 30 mg
  Filled 2014-01-11 (×7): qty 1

## 2014-01-11 MED ORDER — ALBUTEROL SULFATE (2.5 MG/3ML) 0.083% IN NEBU: 2.5000 mg | INHALATION_SOLUTION | RESPIRATORY_TRACT | Status: DC | PRN

## 2014-01-11 MED ORDER — PANTOPRAZOLE SODIUM 40 MG PO PACK
40.0000 mg | PACK | Freq: Every day | ORAL | Status: DC
  Administered 2014-01-11 – 2014-01-17 (×7): 40 mg
  Filled 2014-01-11 (×8): qty 20

## 2014-01-11 NOTE — Progress Notes (Addendum)
ANTIBIOTIC CONSULT NOTE - FOLLOW UP  Pharmacy Consult:  Vancomycin / Zosyn Indication:  Empiric therapy  Allergies  Allergen Reactions  . Fexofenadine Hcl Other (See Comments)    unknown  . Percocet [Oxycodone-Acetaminophen] Other (See Comments)    unknown  . Thorazine [Chlorpromazine Hcl] Other (See Comments)    unknown    Patient Measurements: Height: 6\' 2"  (188 cm) Weight: 121 lb 0.5 oz (54.9 kg) IBW/kg (Calculated) : 82.2  Vital Signs: Temp: 96.2 F (35.7 C) (03/31 0430) Temp src: Core (Comment) (03/31 0400) BP: 107/63 mmHg (03/31 0800) Pulse Rate: 78 (03/31 0800) Intake/Output from previous day: 03/30 0701 - 03/31 0700 In: 1965.3 [I.V.:1372.3; NG/GT:190; IV Piggyback:403] Out: 155 [Urine:155]  Labs:  Recent Labs  01/09/14 0005  01/09/14 1123 01/10/14 1500 01/11/14 0400  WBC 18.0*  --  36.0*  --  31.9*  HGB 11.8*  --  10.3*  --  10.3*  PLT 385  --  151  --  108*  CREATININE 1.17  < > 1.16 2.36* 2.88*  < > = values in this interval not displayed. Estimated Creatinine Clearance: 25.7 ml/min (by C-G formula based on Cr of 2.88). No results found for this basename: VANCOTROUGH, VANCOPEAK, VANCORANDOM, [place], [place], GENTRANDOM, TOBRATROUGH, TOBRAPEAK, TOBRARND, AMIKACINPEAK, AMIKACINTROU, AMIKACIN,  in the last 72 hours   Microbiology: Recent Results (from the past 720 hour(s))  CULTURE, BLOOD (ROUTINE X 2)     Status: None   Collection Time    01/09/2014 11:58 PM      Result Value Ref Range Status   Specimen Description BLOOD WRIST RIGHT   Final   Special Requests BOTTLES DRAWN AEROBIC AND ANAEROBIC 5CC EA   Final   Culture  Setup Time     Final   Value: 01/09/2014 03:47     Performed at [facility]   Culture     Final   Value:        BLOOD CULTURE RECEIVED NO GROWTH TO DATE CULTURE WILL BE HELD FOR 5 DAYS BEFORE ISSUING A FINAL NEGATIVE REPORT     Performed at [facility]   Report Status PENDING   Incomplete  CULTURE, BLOOD  (ROUTINE X 2)     Status: None   Collection Time    01/09/14 12:24 AM      Result Value Ref Range Status   Specimen Description BLOOD LEFT ARM   Final   Special Requests BOTTLES DRAWN AEROBIC ONLY 10CC   Final   Culture  Setup Time     Final   Value: 01/09/2014 03:47     Performed at [facility]   Culture     Final   Value:        BLOOD CULTURE RECEIVED NO GROWTH TO DATE CULTURE WILL BE HELD FOR 5 DAYS BEFORE ISSUING A FINAL NEGATIVE REPORT     Performed at [facility]   Report Status PENDING   Incomplete  URINE CULTURE     Status: None   Collection Time    01/09/14  2:42 AM      Result Value Ref Range Status   Specimen Description URINE, CATHETERIZED   Final   Special Requests ADD [id number]   Final   Culture  Setup Time     Final   Value: 01/09/2014 14:10     Performed at [facility]     Final   Value: NO GROWTH     Performed at  [facility]     Final   Value: NO GROWTH     Performed at [facility]   Report Status 01/10/2014 FINAL   Final  MRSA PCR SCREENING     Status: Abnormal   Collection Time    01/09/14  4:50 AM      Result Value Ref Range Status   MRSA by PCR POSITIVE (*) NEGATIVE Final   Comment:            The GeneXpert MRSA Assay (FDA     approved for NASAL specimens     only), is one component of a     comprehensive MRSA colonization     surveillance program. It is not     intended to diagnose MRSA     infection nor to guide or     monitor treatment for     MRSA infections.     RESULT CALLED TO, READ BACK BY AND VERIFIED WITH:     MACK,B RN [phone] 01/09/14 MITCHELL,L      Assessment: 45 YOM with multi-organ dysfunction to continue vancomycin and Zosyn as empiric therapy.  Patient has acute liver and renal dysfunction.  He is hypothermic and his WBC is trending down after a rapid rise.  3/29 Vanc >> 3/29 Zosyn >>  3/29 Urine - negative 3/29 Blood - NGTD 3/29 MRSA PCR -  positive   Goal of Therapy:  Vancomycin trough level 15-20 mcg/ml   Plan:  - Continue Zosyn 3.375gm IV Q8H, 4 hr infusion - Change vanc to 500mg  IV Q24H - Monitor renal fxn, clinical course, vanc trough as indicated    Ryken Paschal D. Mina Marble, PharmD, BCPS Pager:  [phone] 01/11/2014, 9:01 AM

## 2014-01-11 NOTE — Progress Notes (Signed)
INITIAL NUTRITION ASSESSMENT  Pt meets criteria for SEVERE MALNUTRITION in the context of chronic illness as evidenced by severe fat and muscle wasting.  DOCUMENTATION CODES Per approved criteria  -Severe malnutrition in the context of chronic illness -Underweight   INTERVENTION: Once stable, recommend initiating the adult TF protocol utilizing the 85M PEPuP Pilot arm.     -  Osmolite 1.2 goal rate of 55 ml/hr   (1320 ml 24 hours, 1684 kcal (101% of needs), 88 grams protein, 1070 ml H2O)    - 30 ml Prostat daily    - Recommend monitor magnesium, potassium, and phosphorus daily for at least 3 days, MD to replete as needed, as pt is at risk for refeeding syndrome given severe malnutrition (monitored if PEPuP pilot utilized).   NUTRITION DIAGNOSIS: Inadequate oral intake related to inability to eat as evidenced by NPO status  Goal: Pt to meet >/= 90% of their estimated nutrition needs   Monitor:  Vent status, TF initiation and tolerance  Reason for Assessment: Ventilator  43 y.o. male  Admitting Dx: shortness of breath, leg pain  ASSESSMENT: PMHx significant for schizoaffective disorder, HOH, polysubstance abuse, constipation. From facility. Pt is wheel-chair bound, per hx pt is very conversant and social at his facility.  Patient is currently intubated on ventilator support with shock suspected from HCAP. Pt worsening per MD note plan for aggressive volume resuscitation.  MV: 7.8 L/min Temp (24hrs), Avg:98.1 F (36.7 C), Min:96.2 F (35.7 C), Max:99.1 F (37.3 C)  RD consulted for TF initiation and management.   Height: Ht Readings from Last 1 Encounters:  01/09/14 6\' 2"  (1.88 m)    Weight: Wt Readings from Last 1 Encounters:  01/11/14 121 lb 0.5 oz (54.9 kg)    Ideal Body Weight: 86.3 kg   % Ideal Body Weight: 60%  Wt Readings from Last 10 Encounters:  01/11/14 121 lb 0.5 oz (54.9 kg)  07/21/13 103 lb 13.4 oz (47.1 kg)  03/09/13 92 lb 9.5 oz (42 kg)     Usual Body Weight: 92-103 lb   % Usual Body Weight: >100%  BMI:  Body mass index is 15.53 kg/(m^2).  Estimated Nutritional Needs: Kcal: 9629 Protein: 80-95 grams Fluid: >1.6 L/day  Skin: no issues noted  Diet Order:  NPO  EDUCATION NEEDS: -No education needs identified at this time   Intake/Output Summary (Last 24 hours) at 01/11/14 1232 Last data filed at 01/11/14 1100  Gross per 24 hour  Intake 1354.84 ml  Output    195 ml  Net 1159.84 ml    Last BM: PTA   Labs:   Recent Labs Lab 01/09/14 0358  01/09/14 1123 01/10/14 1500 01/11/14 0400  NA 142  < > 141 144 142  K 4.1  < > 4.2 3.2* 3.8  CL 105  < > 109 101 99  CO2 15*  < > 16* 26 23  BUN 11  < > 14 22 26*  CREATININE 0.90  < > 1.16 2.36* 2.88*  CALCIUM 5.9*  < > 6.4* 5.7* 6.2*  MG 1.4*  --   --   --   --   PHOS 4.4  --   --   --   --   GLUCOSE 120*  < > 139* 77 122*  < > = values in this interval not displayed.  CBG (last 3)   Recent Labs  01/11/14 0345 01/11/14 0807 01/11/14 1209  GLUCAP 131* 126* 116*   No results found for this basename:  HGBA1C   Scheduled Meds: . antiseptic oral rinse  15 mL Mouth Rinse QID  . benztropine  0.5 mg Oral QHS  . benztropine  1 mg Oral Daily  . chlorhexidine  15 mL Mouth Rinse BID  . Chlorhexidine Gluconate Cloth  6 each Topical Q0600  . feeding supplement (PRO-STAT SUGAR FREE 64)  30 mL Per Tube BID  . feeding supplement (VITAL HIGH PROTEIN)  1,000 mL Per Tube Q24H  . free water  100 mL Per Tube 3 times per day  . heparin  5,000 Units Subcutaneous 3 times per day  . insulin aspart  0-15 Units Subcutaneous 6 times per day  . ipratropium-albuterol  3 mL Nebulization Q6H  . levothyroxine  75 mcg Per Tube QAC breakfast  . mirtazapine  30 mg Per Tube QHS  . mupirocin ointment  1 application Nasal BID  . OLANZapine  15 mg Per Tube QHS  . pantoprazole sodium  40 mg Per Tube Q1200  . phenytoin (DILANTIN) IV  100 mg Intravenous 3 times per day  .  piperacillin-tazobactam (ZOSYN)  IV  3.375 g Intravenous Q8H  . [START ON 01/12/2014] vancomycin  500 mg Intravenous Q24H    Continuous Infusions: . dexmedetomidine 1.097 mcg/kg/hr (01/11/14 1100)  . fentaNYL infusion INTRAVENOUS 120 mcg/hr (01/11/14 1117)    Past Medical History  Diagnosis Date  . Chronic pain   . Anxiety   . Schizoaffective disorder   . Hearing loss   . Seizure   . Arthritis   . Asthma   . Foot drop   . Anemia   . Dehydration   . Hypotension   . GERD (gastroesophageal reflux disease)   . Hypothyroidism   . Constipation   . Polysubstance abuse   . Cancer   . Leukocytosis     History reviewed. No pertinent past surgical history.  Brynda Greathouse, MS RD LDN Clinical Inpatient Dietitian Pager: [phone] Weekend/After hours pager: [phone]

## 2014-01-11 NOTE — Progress Notes (Signed)
PULMONARY / CRITICAL CARE MEDICINE   Name: [name] MRN: [id number] DOB: [birth year]    ADMISSION DATE:  12/22/2013  PRIMARY SERVICE: PCCM  CHIEF COMPLAINT:  SOB, leg pain.  BRIEF PATIENT DESCRIPTION:  43 years old male resident of an assisted living facility with PMH relevant for schizoaffective disorder, polysubstance abuse, seizures, COPD,  malnutrition, GERD. Presents with SOB and found to be hypotensive despite 2 L of IVF's. Started on dopamine via peripheral IV by ED and  PCCM called to admit. No clear source of infection.   SIGNIFICANT EVENTS / STUDIES:  3/29 Admitted to PCCM with septic shock and HCAP. Resuscitated with IVFs, vasopressors. Intubated shortly after admission. Bicarbonate infusion initiated for severe metabolic acidosis 1/76 Persistent septic shock. Worsening renal function. Acidosis improved. HCO3 stopped 3/31 Off vasopressors. Severe agitation @ times. Fentanyl and dex infusions initiated. Not able to F/C. Oliguric with worsening renal indices. Foley cath changed out without improvement in Uo. No indication for HD at this time   LINES / TUBES: L IJ CVL 3/29 >>  ETT 3/29 >>    CULTURES: MRSA PCR 3/29 >> POS Urine 3/29 >>  Blood 3/29 >>   ANTIBIOTICS: Vanc 3/29 >>  Pip-tazo 3/29 >>    SUBJECTIVE:  RASS -3 to +3. Not F/C. Synchronous with vent when calm   VITAL SIGNS: Temp:  [96.2 F (35.7 C)-99.1 F (37.3 C)] 97.4 F (36.3 C) (03/31 0800) Pulse Rate:  [38-135] 74 (03/31 1144) Resp:  [0-28] 16 (03/31 1144) BP: (59-151)/(42-112) 136/98 mmHg (03/31 1144) SpO2:  [76 %-100 %] 100 % (03/31 1144) FiO2 (%):  [40 %] 40 % (03/31 1146) Weight:  [54.9 kg (121 lb 0.5 oz)] 54.9 kg (121 lb 0.5 oz) (03/31 0452) HEMODYNAMICS: CVP:  [6 mmHg-73 mmHg] 70 mmHg VENTILATOR SETTINGS: Vent Mode:  [-] PRVC FiO2 (%):  [40 %] 40 % Set Rate:  [16 bmp] 16 bmp Vt Set:  [600 mL] 600 mL PEEP:  [5 cmH20] 5 cmH20 Plateau Pressure:  [16 cmH20-26 cmH20] 18  cmH20 INTAKE / OUTPUT: Intake/Output     03/30 0701 - 03/31 0700 03/31 0701 - 04/01 0700   I.V. (mL/kg) 1372.3 (25) 275.3 (5)   NG/GT 190    IV Piggyback 403 50   Total Intake(mL/kg) 1965.3 (35.8) 325.3 (5.9)   Urine (mL/kg/hr) 155 (0.1) 90 (0.2)   Total Output 155 90   Net +1810.3 +235.3          PHYSICAL EXAMINATION: General: intermittent agitation, Not F/C HEENT: WNL Cardiac: RRR s M Lungs: No wheezes noted Abdomen: Soft, diminished BS Ext: Cool, no edema Neuro: No focal neurologic deficits. RASS -3 to +3  LABS: I have reviewed all of today's lab results. Relevant abnormalities are discussed in the A/P section  CXR: R>L basilar infiltrates on background of hyperinflation/COPD appearance  ASSESSMENT / PLAN:  PULMONARY A: Acute resp failure due to HCAP COPD - emphysema P:   Cont full vent support - settings reviewed and/or adjusted Cont vent bundle Daily SBT if/when meets criteria  CARDIOVASCULAR A:  Septic shock, resolved P:  Cont pressors as needed to maintain MAP > 65 mmHg Cont IVFs to maintain CVP 10-14  RENAL A:   Acute kidney failure, oligo-anuric Metabolic acidosis, improved P:   Monitor BMET intermittently Monitor I/Os Correct electrolytes as indicated  GASTROINTESTINAL A:   H/O GERD, chronic PPI use P:   SUP: IV famotidine TFs initiated 3/31  HEMATOLOGIC A:   Anemia without acute blood loss Mild thrombocytopenia  P:  DVT Px: SQ heparin Monitor CBC intermittently Transfuse per usual ICU guidelines DIC panel ordered for 4/01  INFECTIOUS A:   Severe sepsis RLL HCAP, NOS P:   Micro and abx as above  ENDOCRINE A:   Hypothyroidism Mild hyperglycemia without hx of DM documented Possible relative adrenal insufficiency - timing of cortisol blood draw unclear P:   DC stress dose steroids Check cortisol level AM 4/01 Cont L-thyroxine Cont SSI for now   NEUROLOGIC A:   Acute encephalopathy Schizoaffective disorder Seizure  disorder P:   Cont dex and fent infusions Resume Remeron, Zyprexa and Cogentin Begin PRN Haldol 3/31 Cont AEDs - changed to IV   TODAY'S SUMMARY:  I have personally obtained a history, examined the patient, evaluated laboratory and imaging results, formulated the assessment and plan and placed orders. CRITICAL CARE: The patient is critically ill with multiple organ systems failure and requires high complexity decision making for assessment and support, frequent evaluation and titration of therapies, application of advanced monitoring technologies and extensive interpretation of multiple databases. Critical Care Time devoted to patient care services described in this note is 40 minutes.   Merton Border, MD ; [name] [phone].  After 5:30 PM or weekends, call [phone]   01/11/2014, 1:54 PM

## 2014-01-11 NOTE — Progress Notes (Signed)
eLink Physician-Brief Progress Note Patient Name: Win Guajardo DOB: April 19, 1971 MRN: [id number]  Date of Service  01/11/2014   HPI/Events of Note   Calm now. RN says urine is periodically dark. Has 360cc residual in bladder despite foley  eICU Interventions  Replace foley   Intervention Category Major Interventions: Delirium, psychosis, severe agitation - evaluation and management  Demarrius Guerrero 01/11/2014, 6:53 AM

## 2014-01-11 NOTE — Progress Notes (Signed)
CRITICAL VALUE ALERT  Critical value received:  Ca2+=6.2   Date of notification:  01/11/2014  Time of notification:  0710  Critical value read back:yes  Nurse who received alert:  Barrie Lyme, RN  MD notified (1st page):  Elink  Time of first page:  0720  MD notified (2nd page):  Time of second page:  Responding MD:  Warren Lacy  Time MD responded:  [phone]

## 2014-01-12 ENCOUNTER — Inpatient Hospital Stay (HOSPITAL_COMMUNITY): Payer: Medicare Other

## 2014-01-12 LAB — COMPREHENSIVE METABOLIC PANEL
ALBUMIN: 1.7 g/dL — AB (ref 3.5–5.2)
ALK PHOS: 109 U/L (ref 39–117)
ALT: 1367 U/L — AB (ref 0–53)
AST: 801 U/L — ABNORMAL HIGH (ref 0–37)
BUN: 37 mg/dL — AB (ref 6–23)
CO2: 21 mEq/L (ref 19–32)
Calcium: 6 mg/dL — CL (ref 8.4–10.5)
Chloride: 100 mEq/L (ref 96–112)
Creatinine, Ser: 3.56 mg/dL — ABNORMAL HIGH (ref 0.50–1.35)
GFR calc Af Amer: 23 mL/min — ABNORMAL LOW (ref >90)
GFR calc non Af Amer: 19 mL/min — ABNORMAL LOW (ref >90)
Glucose, Bld: 120 mg/dL — ABNORMAL HIGH (ref 70–99)
POTASSIUM: 3.6 meq/L — AB (ref 3.7–5.3)
SODIUM: 141 meq/L (ref 137–147)
TOTAL PROTEIN: 4.4 g/dL — AB (ref 6.0–8.3)
Total Bilirubin: 0.4 mg/dL (ref 0.3–1.2)

## 2014-01-12 LAB — GLUCOSE, CAPILLARY
GLUCOSE-CAPILLARY: 134 mg/dL — AB (ref 70–99)
GLUCOSE-CAPILLARY: 141 mg/dL — AB (ref 70–99)
Glucose-Capillary: 141 mg/dL — ABNORMAL HIGH (ref 70–99)
Glucose-Capillary: 142 mg/dL — ABNORMAL HIGH (ref 70–99)
Glucose-Capillary: 158 mg/dL — ABNORMAL HIGH (ref 70–99)
Glucose-Capillary: 87 mg/dL (ref 70–99)
Glucose-Capillary: 94 mg/dL (ref 70–99)

## 2014-01-12 LAB — DIC (DISSEMINATED INTRAVASCULAR COAGULATION)PANEL
D-Dimer, Quant: >20 ug/mL-FEU — ABNORMAL HIGH (ref 0.00–0.48)
INR: 2.06 — ABNORMAL HIGH (ref 0.00–1.49)
Platelets: 132 10*3/uL — ABNORMAL LOW (ref 150–400)
Prothrombin Time: 22.6 seconds — ABNORMAL HIGH (ref 11.6–15.2)

## 2014-01-12 LAB — CBC
HCT: 26.6 % — ABNORMAL LOW (ref 39.0–52.0)
Hemoglobin: 9.2 g/dL — ABNORMAL LOW (ref 13.0–17.0)
MCH: 30 pg (ref 26.0–34.0)
MCHC: 34.6 g/dL (ref 30.0–36.0)
MCV: 86.6 fL (ref 78.0–100.0)
PLATELETS: 133 10*3/uL — AB (ref 150–400)
RBC: 3.07 MIL/uL — ABNORMAL LOW (ref 4.22–5.81)
RDW: 14.5 % (ref 11.5–15.5)
WBC: 24 10*3/uL — ABNORMAL HIGH (ref 4.0–10.5)

## 2014-01-12 LAB — BARBITURATE, URINE, CONFIRMATION
Amobarbital UR Quant: NEGATIVE ng/mL
Butalbital UR Quant: NEGATIVE ng/mL
PHENOBARBITAL GC/MS CONF: NEGATIVE ng/mL
Pentobarbital GC/MS Conf: NEGATIVE ng/mL
Secobarbital GC/MS Conf: NEGATIVE ng/mL

## 2014-01-12 LAB — DIC (DISSEMINATED INTRAVASCULAR COAGULATION) PANEL
Fibrinogen: 106 mg/dL — ABNORMAL LOW (ref 204–475)
SMEAR REVIEW: NONE SEEN
aPTT: 52 seconds — ABNORMAL HIGH (ref 24–37)

## 2014-01-12 LAB — CORTISOL: CORTISOL PLASMA: 24.1 ug/dL

## 2014-01-12 LAB — PHOSPHORUS: PHOSPHORUS: 5.6 mg/dL — AB (ref 2.3–4.6)

## 2014-01-12 LAB — MAGNESIUM: MAGNESIUM: 2.1 mg/dL (ref 1.5–2.5)

## 2014-01-12 LAB — PHENYTOIN LEVEL, TOTAL: Phenytoin Lvl: 12.8 ug/mL (ref 10.0–20.0)

## 2014-01-12 MED ORDER — PIPERACILLIN-TAZOBACTAM IN DEX 2-0.25 GM/50ML IV SOLN
2.2500 g | Freq: Four times a day (QID) | INTRAVENOUS | Status: DC
  Administered 2014-01-12 – 2014-01-16 (×17): 2.25 g via INTRAVENOUS
  Filled 2014-01-12 (×20): qty 50

## 2014-01-12 NOTE — Progress Notes (Signed)
CRITICAL VALUE ALERT  Critical value received:  Ca 6.0   Date of notification:  01/12/14  Time of notification:  0710  Critical value read back:yes  Nurse who received alert:  Sanjuana Kava, RN  MD notified (1st page):  Juluis Mire, MD  Time of first page:  0728  MD notified (2nd page):  Time of second page:  Responding MD: Juluis Mire, MD  Time MD responded:  [phone]

## 2014-01-12 NOTE — Clinical Social Work Psychosocial (Signed)
Clinical Social Work Department BRIEF PSYCHOSOCIAL ASSESSMENT 01/12/2014  Patient:  [name]     Account Number:  [id number]     Admit date:  12/14/2013  Clinical Social Worker:  Wylene Men  Date/Time:  01/12/2014 02:14 PM  Referred by:  Care Management  Date Referred:  01/12/2014 Referred for  Psychosocial assessment  Other - See comment   Other Referral:   from a facility   Interview type:  Other - See comment Other interview type:   [name]    PSYCHOSOCIAL DATA Living Status:  FACILITY Admitted from facility:  [facility] Level of care:  Assisted Living Primary support name:  pt has no family/caregivers Primary support relationship to patient:   Degree of support available:   inadequate/non-existent    CURRENT CONCERNS Current Concerns  Other - See comment   Other Concerns:   pt has no support outside of [name] and no family listed with the facility.  Facility states no visitors and no phone calls to friends/family in the past two years.    SOCIAL WORK ASSESSMENT / PLAN CSW was consulted due to pt being a resident at a facility. [name] [place] where he has been for two years.  [facility] to [name], [facility] a [name] closed down due to owner abandonment.  The patient has a "wife" listed on his facesheet.  [facility] states that when they received the pt as a resident in 2013 he was accompanied by [name] and the two stated they were married.  When pressed for legal documentation (red flag: last names were different and SS income had not decreased due to family income), the two report to [name] that they were not in fact married, but dating with intent to marry.  The two were unable to share a room at [name].  [name] states that the last time she spoke to [name] the two were not together and have been "broken up" for quite a few months without  plans to reunite.    [name] at [name] states that the pt has no family listed on his admission paperwork and has not seen the pt with any visitors since he became a resident over two years ago.  To the [name]'s knowledge, the pt has no family and no one to make any decisions for him.    According to [name], the pt baseline of functioning is independent.  The pt cannot ambulate and is wheelchair dependent.  The pt can transfer independently and was compliant and cooperative throughout his stay.  Pt was able to feed himself.  His diet was mechanical soft due to the lack of teeth/dentures.  He also needed assistance showering- other than that he was completely independent and behaved.    [name] stated that she had spoken to MD earlier in the week.  Per chart review, it appears that MD McQuaid had spoken with facility.    Pronosis and disposition: undetermined at this time.    RNCM has been updated on findings.   Assessment/plan status:  Psychosocial Support/Ongoing Assessment of Needs Other assessment/ plan:   Information/referral to community resources:   [name]  possible SNF placement for STR if medical pronosis warrants    PATIENT'S/FAMILY'S RESPONSE TO PLAN OF CARE: Pt unable to be involved in assessment due to intubation/sedation.       [name], [place] [phone]  Clinical Social  Work

## 2014-01-12 NOTE — Progress Notes (Signed)
PULMONARY / CRITICAL CARE MEDICINE   Name: [name] MRN: [id number] DOB: September 16, 1971    ADMISSION DATE:  12/31/2013  PRIMARY SERVICE: PCCM  CHIEF COMPLAINT:  SOB, leg pain.  BRIEF PATIENT DESCRIPTION:  43 years old male resident of an assisted living facility with PMH relevant for schizoaffective disorder, polysubstance abuse, seizures, COPD,  malnutrition, GERD. Presented with SOB and found to be hypotensive despite 2 L of IVF's. Started on dopamine via peripheral IV by ED and  PCCM called to admit. No clear source of infection.   SIGNIFICANT EVENTS / STUDIES:  3/29 Admitted to PCCM with septic shock and HCAP. Resuscitated with IVFs, vasopressors. Intubated shortly after admission. Bicarbonate infusion initiated for severe metabolic acidosis 8/67 Persistent septic shock. Worsening renal function. Acidosis improved. HCO3 stopped 3/31 Off vasopressors. Severe agitation @ times. Fentanyl and dex infusions initiated. Not able to F/C. Oliguric with worsening renal indices. Foley cath changed out without improvement in Uo. No indication for HD at this time 4/1 Close to extubation, urine output slightly improved, will need to contact wife for discussion of goals of care  LINES / TUBES: L IJ CVL 3/29 >>  ETT 3/29 >>  G-tube 3/29 >>  CULTURES: MRSA PCR 3/29 >> POS Urine 3/29 >> NG Blood 3/28 >> NGTD Blood 3/29 >> NGTD Tracheal 3/31 >> Gram stain neg,  NGTD  ANTIBIOTICS: Vanc 3/29 >>  Pip-tazo 3/29 >>    SUBJECTIVE:  RASS -3 to +2. Pt not able to F/C. Synchronous with vent when calm.   VITAL SIGNS: Temp:  [94 F (34.4 C)-99.7 F (37.6 C)] 98.6 F (37 C) (04/01 1500) Pulse Rate:  [70-121] 121 (04/01 1500) Resp:  [0-30] 15 (04/01 1500) BP: (48-146)/(32-103) 104/62 mmHg (04/01 1500) SpO2:  [89 %-100 %] 89 % (04/01 1500) FiO2 (%):  [40 %] 40 % (04/01 1500) Weight:  [53.9 kg (118 lb 13.3 oz)] 53.9 kg (118 lb 13.3 oz) (04/01 0500) HEMODYNAMICS: CVP:  [8 mmHg-80 mmHg] 65  mmHg VENTILATOR SETTINGS: Vent Mode:  [-] PRVC FiO2 (%):  [40 %] 40 % Set Rate:  [16 bmp] 16 bmp Vt Set:  [600 mL] 600 mL PEEP:  [5 cmH20] 5 cmH20 Plateau Pressure:  [18 cmH20-34 cmH20] 34 cmH20 INTAKE / OUTPUT: Intake/Output     03/31 0701 - 04/01 0700 04/01 0701 - 04/02 0700   I.V. (mL/kg) 1160.7 (21.5) 272.7 (5.1)   NG/GT 528.8 440   IV Piggyback 212.5 50   Total Intake(mL/kg) 1902 (35.3) 762.7 (14.2)   Urine (mL/kg/hr) 360 (0.3)    Total Output 360     Net +1542 +762.7          PHYSICAL EXAMINATION: General: mild agitation, Not F/C HEENT: WNL Cardiac: RRR  Lungs: No wheezes noted Abdomen: Soft, diminished BS Ext: Cool, no edema Neuro: No focal neurologic deficits. RASS -3 to +2  LABS: I have reviewed all of today's lab results. Relevant abnormalities are discussed in the A/P section  CXR: R>L basilar infiltrates on background of hyperinflation/COPD appearance. Tubes and lines in good position.  ASSESSMENT / PLAN:  PULMONARY A: Acute resp failure due to HCAP requiring ventilator support  COPD - emphysema P:   Cont full vent support - settings reviewed and/or adjusted Cont vent bundle Daily SBT if/when meets criteria Albuterol neb and duoneb PRN CXR tomm AM  CARDIOVASCULAR A:  Septic shock, off pressor support P:  Start pressors as needed to maintain MAP > 65 mmHg Start IVFs as needed to maintain CVP  10-14  RENAL A:   Acute kidney failure, oligo-anuric, hopeful for recovery Mild hypocalcemia  Metabolic acidosis, resolved P:   Monitor BMET intermittently Monitor I/Os Correct electrolytes as indicated Obtain Vanc trough  GASTROINTESTINAL A:   H/O GERD, chronic PPI use Transaminitis secondary to severe sepsis, improving P:   SUP: IV famotidine TFs  (osmolite and pro-stat) initiated 3/31 Monitor CMP intermittently   HEMATOLOGIC A:   Anemia without acute blood loss Mild thrombocytopenia, stable- likely due to severe sepsis P:  SQ heparin  DVT ppx Monitor CBC intermittently Transfuse per usual ICU guidelines  INFECTIOUS A:   Severe sepsis  (AKI, transaminitis, and encephalopathy) RLL HCAP, NOS MRSA Colonization P:   Micro and abx as above  ENDOCRINE A:   Hypothyroidism Mild hyperglycemia without hx of DM documented Possible relative adrenal insufficiency - AM cortisol normal  P:   Cont L-thyroxine  Start SSI if CBG >180 CBG Q 6 hr  NEUROLOGIC A:   Acute encephalopathy Schizoaffective disorder Seizure disorder P:   Cont precedex and fentanyl infusions Cont Remeron, Zyprexa, Cogentin  Cont Haldol and versed PRN Hold IV phenytoin, obtain phenytoin level   Code: Full   PCCM ATTENDING: I have interviewed and examined the patient and reviewed the database. I have formulated the assessment and plan as reflected in the note above with amendments made by me. 40 mins of direct critical care time provided  Merton Border, MD;  PCCM service; Mobile [phone]

## 2014-01-12 NOTE — Progress Notes (Addendum)
ANTIBIOTIC CONSULT NOTE - FOLLOW UP  Pharmacy Consult:  Vancomycin / Zosyn Indication:  Empiric therapy  Allergies  Allergen Reactions  . Fexofenadine Hcl Other (See Comments)    unknown  . Percocet [Oxycodone-Acetaminophen] Other (See Comments)    unknown  . Thorazine [Chlorpromazine Hcl] Other (See Comments)    unknown    Patient Measurements: Height: 6\' 2"  (188 cm) Weight: 118 lb 13.3 oz (53.9 kg) IBW/kg (Calculated) : 82.2  Vital Signs: Temp: 99.7 F (37.6 C) (04/01 0700) Temp src: Oral (04/01 0800) BP: 96/68 mmHg (04/01 0846) Pulse Rate: 100 (04/01 0800) Intake/Output from previous day: 03/31 0701 - 04/01 0700 In: 1902 [I.V.:1160.7; NG/GT:528.8; IV Piggyback:212.5] Out: [place]:  Recent Labs  01/09/14 1123 01/10/14 1500 01/11/14 0400 01/12/14 0500  WBC 36.0*  --  31.9* 24.0*  HGB 10.3*  --  10.3* 9.2*  PLT 151  --  108* 133*  132*  CREATININE 1.16 2.36* 2.88* 3.56*   Estimated Creatinine Clearance: 20.4 ml/min (by C-G formula based on Cr of 3.56). No results found for this basename: VANCOTROUGH, VANCOPEAK, VANCORANDOM, [place], [place], GENTRANDOM, TOBRATROUGH, TOBRAPEAK, TOBRARND, AMIKACINPEAK, AMIKACINTROU, AMIKACIN,  in the last 72 hours   Microbiology: Recent Results (from the past 720 hour(s))  CULTURE, BLOOD (ROUTINE X 2)     Status: None   Collection Time    2014/01/11 11:58 PM      Result Value Ref Range Status   Specimen Description BLOOD WRIST RIGHT   Final   Special Requests BOTTLES DRAWN AEROBIC AND ANAEROBIC 5CC EA   Final   Culture  Setup Time     Final   Value: 01/09/2014 03:47     Performed at [facility]   Culture     Final   Value:        BLOOD CULTURE RECEIVED NO GROWTH TO DATE CULTURE WILL BE HELD FOR 5 DAYS BEFORE ISSUING A FINAL NEGATIVE REPORT     Performed at [facility]   Report Status PENDING   Incomplete  CULTURE, BLOOD (ROUTINE X 2)     Status: None   Collection Time    01/09/14 12:24  AM      Result Value Ref Range Status   Specimen Description BLOOD LEFT ARM   Final   Special Requests BOTTLES DRAWN AEROBIC ONLY 10CC   Final   Culture  Setup Time     Final   Value: 01/09/2014 03:47     Performed at [facility]   Culture     Final   Value:        BLOOD CULTURE RECEIVED NO GROWTH TO DATE CULTURE WILL BE HELD FOR 5 DAYS BEFORE ISSUING A FINAL NEGATIVE REPORT     Performed at [facility]   Report Status PENDING   Incomplete  URINE CULTURE     Status: None   Collection Time    01/09/14  2:42 AM      Result Value Ref Range Status   Specimen Description URINE, CATHETERIZED   Final   Special Requests ADD [id number] [phone]   Final   Culture  Setup Time     Final   Value: 01/09/2014 14:10     Performed at [place]     Final   Value: NO GROWTH     Performed at [facility]   Culture     Final   Value: NO GROWTH  Performed at [facility]   Report Status 01/10/2014 FINAL   Final  MRSA PCR SCREENING     Status: Abnormal   Collection Time    01/09/14  4:50 AM      Result Value Ref Range Status   MRSA by PCR POSITIVE (*) NEGATIVE Final   Comment:            The GeneXpert MRSA Assay (FDA     approved for NASAL specimens     only), is one component of a     comprehensive MRSA colonization     surveillance program. It is not     intended to diagnose MRSA     infection nor to guide or     monitor treatment for     MRSA infections.     RESULT CALLED TO, READ BACK BY AND VERIFIED WITH:     MACK,B RN [phone] 01/09/14 MITCHELL,L      Assessment: 66 YOM with multi-organ dysfunction to continue vancomycin and Zosyn as empiric therapy for septic shock/HCAP.  Patient has acute liver and renal dysfunction.  SCr continues to worsen and CrCl now ~20 ml/min. He was hypothermic and his WBC is now trending down after a rapid rise.  3/29 Vanc >> 3/29 Zosyn >>  3/29 Urine - negative 3/29 Blood - NGTD 3/29 MRSA PCR -  positive   Goal of Therapy:  Vancomycin trough level 15-20 mcg/ml   Plan:  - Change Zosyn to 2.25gm IV q6h - Hold further Vanc doses until level back tomorrow morning - Continue to monitor renal function, C&S, clinical course   Harolyn Rutherford, PharmD Clinical Pharmacist - Resident Pager: [phone] Pharmacy: [phone] 01/12/2014 9:00 AM   Addendum: Phenytoin level corrects to 28.8 in setting of worsening renal function.  Plan: Hold Phenytoin. Recheck level in 24 hours (tomorrow at 1400).   Sloan Leiter, PharmD, BCPS Clinical Pharmacist [phone] 01/12/2014, 4:01 PM

## 2014-01-12 DEATH — deceased

## 2014-01-13 ENCOUNTER — Inpatient Hospital Stay (HOSPITAL_COMMUNITY): Payer: Medicare Other

## 2014-01-13 DIAGNOSIS — N179 Acute kidney failure, unspecified: Secondary | ICD-10-CM

## 2014-01-13 DIAGNOSIS — J441 Chronic obstructive pulmonary disease with (acute) exacerbation: Secondary | ICD-10-CM

## 2014-01-13 DIAGNOSIS — A419 Sepsis, unspecified organism: Secondary | ICD-10-CM

## 2014-01-13 LAB — CULTURE, RESPIRATORY W GRAM STAIN: Culture: NO GROWTH

## 2014-01-13 LAB — GLUCOSE, CAPILLARY
Glucose-Capillary: 129 mg/dL — ABNORMAL HIGH (ref 70–99)
Glucose-Capillary: 142 mg/dL — ABNORMAL HIGH (ref 70–99)
Glucose-Capillary: 120 mg/dL — ABNORMAL HIGH (ref 70–99)
Glucose-Capillary: 150 mg/dL — ABNORMAL HIGH (ref 70–99)
Glucose-Capillary: 128 mg/dL — ABNORMAL HIGH (ref 70–99)

## 2014-01-13 LAB — URINALYSIS, ROUTINE W REFLEX MICROSCOPIC
Bilirubin Urine: NEGATIVE
Glucose, UA: NEGATIVE mg/dL
KETONES UR: NEGATIVE mg/dL
LEUKOCYTES UA: NEGATIVE
NITRITE: NEGATIVE
Protein, ur: NEGATIVE mg/dL
Specific Gravity, Urine: 1.014 (ref 1.005–1.030)
UROBILINOGEN UA: 0.2 mg/dL (ref 0.0–1.0)
pH: 6 (ref 5.0–8.0)

## 2014-01-13 LAB — BASIC METABOLIC PANEL
BUN: 45 mg/dL — ABNORMAL HIGH (ref 6–23)
BUN: 47 mg/dL — ABNORMAL HIGH (ref 6–23)
CALCIUM: 5.7 mg/dL — AB (ref 8.4–10.5)
CHLORIDE: 103 meq/L (ref 96–112)
CO2: 23 mEq/L (ref 19–32)
CO2: 22 meq/L (ref 19–32)
CREATININE: 4.29 mg/dL — AB (ref 0.50–1.35)
Calcium: 5.8 mg/dL — CL (ref 8.4–10.5)
Chloride: 102 mEq/L (ref 96–112)
Creatinine, Ser: 4.24 mg/dL — ABNORMAL HIGH (ref 0.50–1.35)
GFR calc Af Amer: 18 mL/min — ABNORMAL LOW (ref >90)
GFR calc non Af Amer: 16 mL/min — ABNORMAL LOW (ref >90)
GFR calc non Af Amer: 16 mL/min — ABNORMAL LOW (ref >90)
GFR, EST AFRICAN AMERICAN: 18 mL/min — AB (ref >90)
GLUCOSE: 120 mg/dL — AB (ref 70–99)
Glucose, Bld: 121 mg/dL — ABNORMAL HIGH (ref 70–99)
POTASSIUM: 3.2 meq/L — AB (ref 3.7–5.3)
Potassium: 2.8 mEq/L — CL (ref 3.7–5.3)
SODIUM: 143 meq/L (ref 137–147)
Sodium: 145 mEq/L (ref 137–147)

## 2014-01-13 LAB — CULTURE, RESPIRATORY: GRAM STAIN: NONE SEEN

## 2014-01-13 LAB — CBC
HCT: 23.6 % — ABNORMAL LOW (ref 39.0–52.0)
Hemoglobin: 8.2 g/dL — ABNORMAL LOW (ref 13.0–17.0)
MCH: 30.6 pg (ref 26.0–34.0)
MCHC: 34.7 g/dL (ref 30.0–36.0)
MCV: 88.1 fL (ref 78.0–100.0)
Platelets: 142 10*3/uL — ABNORMAL LOW (ref 150–400)
RBC: 2.68 MIL/uL — ABNORMAL LOW (ref 4.22–5.81)
RDW: 14.8 % (ref 11.5–15.5)
WBC: 21.4 10*3/uL — ABNORMAL HIGH (ref 4.0–10.5)

## 2014-01-13 LAB — MAGNESIUM: MAGNESIUM: 1.7 mg/dL (ref 1.5–2.5)

## 2014-01-13 LAB — PROTEIN / CREATININE RATIO, URINE
Creatinine, Urine: 33.76 mg/dL
Protein Creatinine Ratio: 0.95 — ABNORMAL HIGH (ref 0.00–0.15)
Total Protein, Urine: 32 mg/dL

## 2014-01-13 LAB — URINE MICROSCOPIC-ADD ON

## 2014-01-13 LAB — PHENYTOIN LEVEL, TOTAL: Phenytoin Lvl: 9.3 ug/mL — ABNORMAL LOW (ref 10.0–20.0)

## 2014-01-13 LAB — VANCOMYCIN, TROUGH: VANCOMYCIN TR: 42.2 ug/mL — AB (ref 10.0–20.0)

## 2014-01-13 MED ORDER — POTASSIUM CHLORIDE 10 MEQ/100ML IV SOLN
10.0000 meq | INTRAVENOUS | Status: DC
  Administered 2014-01-13: 10 meq via INTRAVENOUS
  Filled 2014-01-13: qty 100

## 2014-01-13 MED ORDER — ETOMIDATE 2 MG/ML IV SOLN
20.0000 mg | Freq: Once | INTRAVENOUS | Status: AC
  Administered 2014-01-13: 20 mg via INTRAVENOUS

## 2014-01-13 MED ORDER — DEXMEDETOMIDINE HCL IN NACL 400 MCG/100ML IV SOLN
0.4000 ug/kg/h | INTRAVENOUS | Status: DC
  Administered 2014-01-13: 0.7 ug/kg/h via INTRAVENOUS
  Administered 2014-01-13: 1.2 ug/kg/h via INTRAVENOUS
  Administered 2014-01-13: 0.9 ug/kg/h via INTRAVENOUS
  Administered 2014-01-14: 1 ug/kg/h via INTRAVENOUS
  Administered 2014-01-14: 1.2 ug/kg/h via INTRAVENOUS
  Administered 2014-01-15 (×4): 1 ug/kg/h via INTRAVENOUS
  Administered 2014-01-16 (×2): 1.2 ug/kg/h via INTRAVENOUS
  Administered 2014-01-16: 1 ug/kg/h via INTRAVENOUS
  Filled 2014-01-13 (×10): qty 100
  Filled 2014-01-13: qty 200
  Filled 2014-01-13 (×3): qty 100

## 2014-01-13 MED ORDER — POTASSIUM CHLORIDE 10 MEQ/50ML IV SOLN
10.0000 meq | INTRAVENOUS | Status: AC
  Administered 2014-01-13 (×2): 10 meq via INTRAVENOUS
  Filled 2014-01-13 (×2): qty 50

## 2014-01-13 MED ORDER — MAGNESIUM SULFATE 40 MG/ML IJ SOLN
2.0000 g | Freq: Once | INTRAMUSCULAR | Status: AC
  Administered 2014-01-13: 2 g via INTRAVENOUS
  Filled 2014-01-13: qty 50

## 2014-01-13 MED ORDER — POTASSIUM CHLORIDE 10 MEQ/50ML IV SOLN
INTRAVENOUS | Status: AC
  Administered 2014-01-13: 10 meq via INTRAVENOUS
  Filled 2014-01-13: qty 50

## 2014-01-13 MED ORDER — MIDAZOLAM HCL 2 MG/2ML IJ SOLN
INTRAMUSCULAR | Status: AC
  Filled 2014-01-13: qty 4

## 2014-01-13 MED ORDER — POTASSIUM CHLORIDE 10 MEQ/50ML IV SOLN
10.0000 meq | Freq: Once | INTRAVENOUS | Status: AC
  Administered 2014-01-13: 10 meq via INTRAVENOUS

## 2014-01-13 MED ORDER — ROCURONIUM BROMIDE 50 MG/5ML IV SOLN
1.0000 mg/kg | Freq: Once | INTRAVENOUS | Status: AC
  Administered 2014-01-13: 50 mg via INTRAVENOUS
  Filled 2014-01-13: qty 6.22

## 2014-01-13 NOTE — Progress Notes (Signed)
Dr Nicoletta Dress notified of pt's abdominal distention and OG tube residual >300. Tube feeds stopped and KUB ordered.

## 2014-01-13 NOTE — Procedures (Signed)
Intubation Procedure Note Hurshel Bouillon [id number] 09/21/1971  Procedure: Intubation Indications: Respiratory insufficiency  Procedure Details Consent: Unable to obtain consent because of emergent medical necessity. Time Out: Verified patient identification, verified procedure, site/side was marked, verified correct patient position, special equipment/implants available, medications/allergies/relevent history reviewed, required imaging and test results available.  Performed  Drugs Etomidate 20mg , Rocuronium 50mg  DL x 1 with MAC 3 blade Grade 1 view 7.5 tube passed through cords under direct visualization Placement confirmed with bilateral breath sounds, positive EtCO2 change and smoke in tube   Evaluation Hemodynamic Status: BP stable throughout; O2 sats: stable throughout Patient's Current Condition: stable Complications: No apparent complications Patient did tolerate procedure well. Chest X-ray ordered to verify placement.  CXR: pending.   MCQUAID, DOUGLAS 01/13/2014

## 2014-01-13 NOTE — Progress Notes (Signed)
CRITICAL VALUE ALERT  Critical value received:  Calcium 5.8  Date of notification:  01/13/2014  Time of notification:  6010  Critical value read back:yes  Nurse who received alert:  Elliot Gurney, RN  MD notified (1st page):    Time of first page:  Montey Hora  MD notified (2nd page):  Time of second page:  Responding MD:  Montey Hora  Time MD responded:  1155

## 2014-01-13 NOTE — Progress Notes (Signed)
NUTRITION FOLLOW UP  Intervention:   1.  Enteral nutrition; resume Osmolite 1.2 at 55 mL/hr (1320 mL) using PEPuP protocol.  (1320 ml 24 hours, 1684 kcal (101% of needs), 88 grams protein, 1070 ml H2O)  - 30 ml Prostat daily  2.  Nutrition-related medication; consider bowel regimen such as reglan with resume of TFs.   NUTRITION DIAGNOSIS:  Inadequate oral intake related to inability to eat as evidenced by NPO status   Goal:  Pt to meet >/= 90% of their estimated nutrition needs, Not met.  TFs held.  Monitor:  Vent status, TF initiation and tolerance  Assessment:   PMHx significant for schizoaffective disorder, HOH, polysubstance abuse, constipation. From facility. Pt is wheel-chair bound, per hx pt is very conversant and social at his facility.  Patient is currently intubated on ventilator support with shock suspected from HCAP. Pt worsening per MD note plan for aggressive volume resuscitation.  MV: 12.1 L/min  Temp (24hrs), Avg:98.1 F (36.7 C), Min:96.2 F (35.7 C), Max:99.1 F (37.3 C)   Abdomen distended this AM.  TFs held for residuals ~300 mL.  Need to get bowel regimen started.  Pt self-extubated this AM.  Currently being re-intubated.   Potassium elevated.  Magnesium WNL.   Height: Ht Readings from Last 1 Encounters:  01/09/14 6' 2" (1.88 m)    Weight Status:   Wt Readings from Last 1 Encounters:  01/13/14 137 lb 2 oz (62.2 kg)    Re-estimated needs:  Kcal: 1674  Protein: 80-95 grams  Fluid: >1.6 L/day  Skin: no issues noted  Diet Order:  NPO   Intake/Output Summary (Last 24 hours) at 01/13/14 1121 Last data filed at 01/13/14 1000  Gross per 24 hour  Intake 2230.78 ml  Output    345 ml  Net 1885.78 ml    Last BM: PTA  Labs:   Recent Labs Lab 01/09/14 0358  01/11/14 0400 01/11/14 1306 01/12/14 0106 01/12/14 0500 01/13/14 0510  NA 142  < > 142  --   --  141 145  K 4.1  < > 3.8  --   --  3.6* 2.8*  CL 105  < > 99  --   --  100 103  CO2  15*  < > 23  --   --  21 23  BUN 11  < > 26*  --   --  37* 45*  CREATININE 0.90  < > 2.88*  --   --  3.56* 4.29*  CALCIUM 5.9*  < > 6.2*  --   --  6.0* 5.7*  MG 1.4*  --   --  2.1 2.1  --   --   PHOS 4.4  --   --  6.1* 5.6*  --   --   GLUCOSE 120*  < > 122*  --   --  120* 121*  < > = values in this interval not displayed.  CBG (last 3)   Recent Labs  01/12/14 2349 01/13/14 0349 01/13/14 0749  GLUCAP 141* 129* 142*    Scheduled Meds: . antiseptic oral rinse  15 mL Mouth Rinse QID  . benztropine  0.5 mg Oral QHS  . benztropine  1 mg Oral Daily  . chlorhexidine  15 mL Mouth Rinse BID  . feeding supplement (PRO-STAT SUGAR FREE 64)  30 mL Per Tube Q1500  . free water  100 mL Per Tube 3 times per day  . heparin  5,000 Units Subcutaneous 3 times per day  .  ipratropium-albuterol  3 mL Nebulization Q6H  . levothyroxine  75 mcg Per Tube QAC breakfast  . mirtazapine  30 mg Per Tube QHS  . mupirocin ointment  1 application Nasal BID  . OLANZapine  15 mg Per Tube QHS  . pantoprazole sodium  40 mg Per Tube Q1200  . piperacillin-tazobactam (ZOSYN)  IV  2.25 g Intravenous 4 times per day    Continuous Infusions: . dexmedetomidine 1.2 mcg/kg/hr (01/13/14 0646)  . feeding supplement (OSMOLITE 1.2 CAL) 1,000 mL (01/12/14 1702)  . fentaNYL infusion INTRAVENOUS 150 mcg/hr (01/13/14 0815)     , MS RD LDN Clinical Inpatient Dietitian Pager: [phone] Weekend/After hours pager: [phone]  

## 2014-01-13 NOTE — Progress Notes (Signed)
Pt's Blood Pressure 77/42. Dr Nicoletta Dress notified. Precedex rate decreased to 1.0 mg per hour.

## 2014-01-13 NOTE — Progress Notes (Signed)
PULMONARY / CRITICAL CARE MEDICINE   Name: [name] [name] MRN: [id number] DOB: [birth year]    ADMISSION DATE:  01/07/2014  PRIMARY SERVICE: PCCM  CHIEF COMPLAINT:  SOB, leg pain.  BRIEF PATIENT DESCRIPTION:  43 years old male resident of an assisted living facility with PMH relevant for schizoaffective disorder, polysubstance abuse, seizures, COPD,  malnutrition, GERD. Presented with SOB and found to be hypotensive despite 2 L of IVF's. Started on dopamine via peripheral IV by ED and  PCCM called to admit. No clear source of infection.   SIGNIFICANT EVENTS / STUDIES:  3/29 Admitted to PCCM with septic shock and HCAP. Resuscitated with IVFs, vasopressors. Intubated shortly after admission. Bicarbonate infusion initiated for severe metabolic acidosis 6/83 Persistent septic shock. Worsening renal function. Acidosis improved. HCO3 stopped 3/31 Off vasopressors. Severe agitation @ times. Fentanyl and dex infusions initiated. Not able to F/C. Oliguric with worsening renal indices. Foley cath changed out without improvement in Uo. No indication for HD at this time 4/1 Close to extubation, urine output slightly improved, will need to contact wife for discussion of goals of care 4/2 Still agitated, self-extubated, re-intubated successfully. Continued worsening renal function. No HCPA or family/significant other contact. Will consult with ethics committee to resume responsibility for [facility] decisions.   LINES / TUBES: L IJ CVL 3/29 >>  ETT 3/29 >> 4/2, 4/2 (self extubated and re-intubated) >>    CULTURES: MRSA PCR 3/29 >> POS Urine 3/29 >> NG Blood 3/28 >> NGTD Blood 3/29 >> NGTD Tracheal 3/31 >> Gram stain neg,  NG  ANTIBIOTICS: Vanc 3/29 >> 4/2 Pip-tazo 3/29 >>    SUBJECTIVE:  RASS -3 to +3. Pt still with agitation and extubated himself. Pt not able to follow commands.    VITAL SIGNS: Temp:  [97.6 F (36.4 C)-98.6 F (37 C)] 98.5 F (36.9 C) (04/02 0350) Pulse Rate:  [83-121] 92  (04/02 0408) Resp:  [15-26] 19 (04/02 0408) BP: (74-139)/(49-79) 139/79 mmHg (04/02 0408) SpO2:  [89 %-100 %] 98 % (04/02 0408) FiO2 (%):  [40 %] 40 % (04/02 0408) Weight:  [62.2 kg (137 lb 2 oz)] 62.2 kg (137 lb 2 oz) (04/02 0408) HEMODYNAMICS: CVP:  [12 mmHg-80 mmHg] 65 mmHg VENTILATOR SETTINGS: Vent Mode:  [-] PRVC FiO2 (%):  [40 %] 40 % Set Rate:  [16 bmp] 16 bmp Vt Set:  [600 mL] 600 mL PEEP:  [5 cmH20] 5 cmH20 Plateau Pressure:  [19 cmH20-34 cmH20] 20 cmH20 INTAKE / OUTPUT: Intake/Output     04/01 0701 - 04/02 0700 04/02 0701 - 04/03 0700   I.V. (mL/kg) 497.6 (8)    NG/GT 1440    IV Piggyback 100    Total Intake(mL/kg) 2037.6 (32.8)    Urine (mL/kg/hr) 310 (0.2)    Total Output 310     Net +1727.6            PHYSICAL EXAMINATION: General: mild agitation, Not F/C HEENT: WNL Cardiac: RRR  Lungs: No wheezes noted Abdomen: Soft, diminished BS Ext: Cool, no edema Neuro: No focal neurologic deficits. RASS -3 to +2  LABS: I have reviewed all of today's lab results. Relevant abnormalities are discussed in the A/P section  CXR: S light worsening of R>L basilar infiltrates   ASSESSMENT / PLAN:  PULMONARY A: Acute resp failure due to HCAP requiring ventilator support  COPD - emphysema P:   Cont full vent support Cont vent bundle SBT in AM Albuterol neb and duoneb PRN Ethics committee consult for health care decision making (  Dr. Andria Frames [phone])  CARDIOVASCULAR A:  Septic shock, off pressor support P:  Restart pressors as needed to maintain MAP > 65 mmHg Restart IVFs as needed to maintain CVP 10-14  RENAL A:   Acute kidney failure, oligo-anuric, most likely due to severe sepsis and recent vancomycin   Hypocalcemia  Metabolic acidosis, resolved P:   Monitor BMET intermittently Monitor I/Os Correct electrolytes as indicated Discontinue vancomycin Obtain UA, CK, ionized Ca, Urinary Protein:Cr   GASTROINTESTINAL A:   H/O GERD, chronic PPI  use Transaminitis secondary to severe sepsis, improving P:   IV famotidine Tube feedings osmolite and pro-stat) initiated 3/31 Monitor CMP intermittently   HEMATOLOGIC A:   Normocytic Anemia, likely due to severe sepsis  Mild thrombocytopenia, stable- likely due to severe sepsis P:  SQ heparin DVT ppx Monitor CBC intermittently Transfuse per usual ICU guidelines  INFECTIOUS A:   Severe sepsis  (AKI, transaminitis, and encephalopathy) RLL HCAP, NOS MRSA Colonization P:   Micro and abx as above  ENDOCRINE A:   Hypothyroidism Hyperglycemia - improving  P:   Cont L-thyroxine  Start SSI if CBG >180 CBG Q 6 hr  NEUROLOGIC/PSYCHOSOCIAL A:   Acute encephalopathy with severe agitation Schizoaffective disorder Seizure disorder No HCPOA identified P:   Cont precedex and fentanyl infusions Cont Remeron, Zyprexa, Cogentin  Cont Haldol and versed PRN Cont IV phenytoin, levels low Ethic consult requested  Code: Full   PCCM ATTENDING: I have interviewed and examined the patient and reviewed the database. I have formulated the assessment and plan as reflected in the note above with amendments made by me. 40 mins of direct critical care time provided  Seen with HO1, Dr Naaman Plummer  Despite his relative youth, he is in very poor health with a poor QOL @ baseline. He is now very critically ill. There are no family members identified and reportedly, he receives no visitors in the NH. He is a poor candidate for trach tube, long term vent support, ACLS or renal replacement in any form f any duration. We have requested Ethic consult to direct [place] how to proceed re: establishing limitations of interventions   Merton Border, MD;  PCCM service; [place] [phone]

## 2014-01-13 NOTE — Progress Notes (Signed)
Pt woke extremely agitated, writhing and attempting to pull at tubes. Pt given 2mg  versed IVP. Precedex increased from 1.1mg  to 1.2mg . Pt inline suctioned. Small amount of thick sputum obtained.

## 2014-01-14 ENCOUNTER — Inpatient Hospital Stay (HOSPITAL_COMMUNITY): Payer: Medicare Other

## 2014-01-14 LAB — GLUCOSE, CAPILLARY
GLUCOSE-CAPILLARY: 124 mg/dL — AB (ref 70–99)
GLUCOSE-CAPILLARY: 120 mg/dL — AB (ref 70–99)
Glucose-Capillary: 120 mg/dL — ABNORMAL HIGH (ref 70–99)
Glucose-Capillary: 126 mg/dL — ABNORMAL HIGH (ref 70–99)
Glucose-Capillary: 156 mg/dL — ABNORMAL HIGH (ref 70–99)

## 2014-01-14 LAB — CBC
HEMATOCRIT: 24.5 % — AB (ref 39.0–52.0)
Hemoglobin: 8.1 g/dL — ABNORMAL LOW (ref 13.0–17.0)
MCH: 29.5 pg (ref 26.0–34.0)
MCHC: 33.1 g/dL (ref 30.0–36.0)
MCV: 89.1 fL (ref 78.0–100.0)
PLATELETS: 140 10*3/uL — AB (ref 150–400)
RBC: 2.75 MIL/uL — ABNORMAL LOW (ref 4.22–5.81)
RDW: 15.2 % (ref 11.5–15.5)
WBC: 33.1 10*3/uL — AB (ref 4.0–10.5)

## 2014-01-14 LAB — COMPREHENSIVE METABOLIC PANEL
ALBUMIN: 1.5 g/dL — AB (ref 3.5–5.2)
ALK PHOS: 96 U/L (ref 39–117)
ALT: 528 U/L — AB (ref 0–53)
AST: 102 U/L — AB (ref 0–37)
BILIRUBIN TOTAL: 1 mg/dL (ref 0.3–1.2)
BUN: 51 mg/dL — ABNORMAL HIGH (ref 6–23)
CHLORIDE: 99 meq/L (ref 96–112)
CO2: 20 meq/L (ref 19–32)
Calcium: 5.9 mg/dL — CL (ref 8.4–10.5)
Creatinine, Ser: 4.57 mg/dL — ABNORMAL HIGH (ref 0.50–1.35)
GFR calc Af Amer: 17 mL/min — ABNORMAL LOW (ref >90)
GFR calc non Af Amer: 14 mL/min — ABNORMAL LOW (ref >90)
Glucose, Bld: 127 mg/dL — ABNORMAL HIGH (ref 70–99)
POTASSIUM: 3.5 meq/L — AB (ref 3.7–5.3)
Sodium: 137 mEq/L (ref 137–147)
Total Protein: 4.9 g/dL — ABNORMAL LOW (ref 6.0–8.3)

## 2014-01-14 LAB — CLOSTRIDIUM DIFFICILE BY PCR: CDIFFPCR: NEGATIVE

## 2014-01-14 LAB — CALCIUM, IONIZED: Calcium, Ion: 0.85 mmol/L — ABNORMAL LOW (ref 1.12–1.23)

## 2014-01-14 LAB — PHOSPHORUS: PHOSPHORUS: 3.7 mg/dL (ref 2.3–4.6)

## 2014-01-14 LAB — CK: CK TOTAL: 336 U/L — AB (ref 7–232)

## 2014-01-14 LAB — PHENYTOIN LEVEL, TOTAL: Phenytoin Lvl: 8.5 ug/mL — ABNORMAL LOW (ref 10.0–20.0)

## 2014-01-14 LAB — MAGNESIUM: Magnesium: 2.2 mg/dL (ref 1.5–2.5)

## 2014-01-14 MED ORDER — POTASSIUM CHLORIDE 10 MEQ/50ML IV SOLN
10.0000 meq | INTRAVENOUS | Status: AC
  Administered 2014-01-14 (×2): 10 meq via INTRAVENOUS
  Filled 2014-01-14 (×2): qty 50

## 2014-01-14 NOTE — Progress Notes (Signed)
CONSULT NOTE - FOLLOW UP  Pharmacy Consult:  Zosyn and Phenytoin Indication:  Empiric therapy for PNA; h/o sz  Allergies  Allergen Reactions  . Fexofenadine Hcl Other (See Comments)    unknown  . Percocet [Oxycodone-Acetaminophen] Other (See Comments)    unknown  . Thorazine [Chlorpromazine Hcl] Other (See Comments)    unknown    Patient Measurements: Height: 6\' 2"  (188 cm) Weight: 139 lb 15.9 oz (63.5 kg) IBW/kg (Calculated) : 82.2  Vital Signs: Temp: 97.8 F (36.6 C) (04/03 0700) Temp src: Oral (04/03 1200) BP: 99/57 mmHg (04/03 1300) Pulse Rate: 95 (04/03 1300) Intake/Output from previous day: 04/02 0701 - 04/03 0700 In: 2477.4 [I.V.:952.4; NG/GT:1175; IV Piggyback:350] Out: 390 [Urine:390]  Labs:  Recent Labs  01/12/14 0500 01/13/14 0510 01/13/14 1000 01/13/14 1713 01/14/14 0500 01/14/14 0658  WBC 24.0* 21.4*  --   --   --  33.1*  HGB 9.2* 8.2*  --   --   --  8.1*  PLT 133*  132* 142*  --   --   --  140*  LABCREA  --   --   --  33.76  --   --   CREATININE 3.56* 4.29* 4.24*  --  4.57*  --    Estimated Creatinine Clearance: 18.7 ml/min (by C-G formula based on Cr of 4.57).  Recent Labs  01/13/14 0510  [place] 42.2*     Microbiology: Recent Results (from the past 720 hour(s))  CULTURE, BLOOD (ROUTINE X 2)     Status: None   Collection Time    12/30/2013 11:58 PM      Result Value Ref Range Status   Specimen Description BLOOD WRIST RIGHT   Final   Special Requests BOTTLES DRAWN AEROBIC AND ANAEROBIC 5CC EA   Final   Culture  Setup Time     Final   Value: 01/09/2014 03:47     Performed at [facility]   Culture     Final   Value:        BLOOD CULTURE RECEIVED NO GROWTH TO DATE CULTURE WILL BE HELD FOR 5 DAYS BEFORE ISSUING A FINAL NEGATIVE REPORT     Performed at [facility]   Report Status PENDING   Incomplete  CULTURE, BLOOD (ROUTINE X 2)     Status: None   Collection Time    01/09/14 12:24 AM      Result Value Ref Range  Status   Specimen Description BLOOD LEFT ARM   Final   Special Requests BOTTLES DRAWN AEROBIC ONLY 10CC   Final   Culture  Setup Time     Final   Value: 01/09/2014 03:47     Performed at [facility]   Culture     Final   Value:        BLOOD CULTURE RECEIVED NO GROWTH TO DATE CULTURE WILL BE HELD FOR 5 DAYS BEFORE ISSUING A FINAL NEGATIVE REPORT     Performed at [facility]   Report Status PENDING   Incomplete  URINE CULTURE     Status: None   Collection Time    01/09/14  2:42 AM      Result Value Ref Range Status   Specimen Description URINE, CATHETERIZED   Final   Special Requests ADD [id number]   Final   Culture  Setup Time     Final   Value: 01/09/2014 14:10     Performed at [facility]  Final   Value: NO GROWTH     Performed at [facility]   Culture     Final   Value: NO GROWTH     Performed at [facility]   Report Status 01/10/2014 FINAL   Final  MRSA PCR SCREENING     Status: Abnormal   Collection Time    01/09/14  4:50 AM      Result Value Ref Range Status   MRSA by PCR POSITIVE (*) NEGATIVE Final   Comment:            The GeneXpert MRSA Assay (FDA     approved for NASAL specimens     only), is one component of a     comprehensive MRSA colonization     surveillance program. It is not     intended to diagnose MRSA     infection nor to guide or     monitor treatment for     MRSA infections.     RESULT CALLED TO, READ BACK BY AND VERIFIED WITH:     MACK,B RN [phone] 01/09/14 MITCHELL,L  CULTURE, RESPIRATORY (NON-EXPECTORATED)     Status: None   Collection Time    01/11/14 11:53 AM      Result Value Ref Range Status   Specimen Description TRACHEAL ASPIRATE   Final   Special Requests NONE   Final   Gram Stain     Final   Value: NO WBC SEEN     NO SQUAMOUS EPITHELIAL CELLS SEEN     NO ORGANISMS SEEN     Performed at [facility]   Culture     Final   Value: NO GROWTH 2 DAYS     Performed at  [facility]   Report Status 01/13/2014 FINAL   Final      Assessment: ID: Zosyn #6 for PNA. Afeb. WBC trending up to 33.1. LA up (3/29)  3/29 Vanc >>4/2 3/29 Zosyn >>  4/1 VT>>42.2  3/31 TA - neg 3/29 Urine - neg 3/29 Blood - NGTD 3/29 MRSA PCR - pos  Neuro:  Phenytoin 300mg   Qhs PTA (~5.8mg /kg/d) changed to same daily dose IV (100mg  IV Q8H on 3/30). Due to high levels, phenytoin has been held since 4/1. No sz activity noted. Appears pt clearing very slowly with worsening renal dysfunction. SCr up to 4.57.  Phenytoin levels: - 3/29 level 7.9 >> corrects to 12.7 mcg/ml - 4/1 level 12.8 >> corrects to  28.87 mcg/ml (alb 1.7) - 4/2 level 9.3 >> corrects to 21.1 mcg/ml - 4/3 level 8.5>> corrects to  21.2 mcg/ml (alb 1.5)  Goal of Therapy:  Resolution of infection Phenytoin corrected level 10-20 mcg/ml   Plan:  - Continue  Zosyn 2.25gm IV q6h.  - Continue to monitor renal function, clinical course - Continue to hold phenytoin - Will recheck phenytoin level 4/5 a.m.  Sherlon Handing, PharmD, BCPS Clinical pharmacist, pager [phone] 01/14/2014 1:58 PM

## 2014-01-14 NOTE — Progress Notes (Signed)
CRITICAL VALUE ALERT  Critical value received:  CA 5.9   Date of notification:  01/14/2014  Time of notification:  0814  Critical value read back: yes   Nurse who received alert:  Allene Pyo   MD notified (1st page):  Dr. Naaman Plummer   Time of first page:  0620  MD notified (2nd page):   Time of second page:  Responding MD:  Dr. Naaman Plummer  Time MD responded:  [phone]

## 2014-01-14 NOTE — Progress Notes (Signed)
PULMONARY / CRITICAL CARE MEDICINE   Name: [name] [name] MRN: [id number] DOB: 1971-03-27    ADMISSION DATE:  01/05/2014  PRIMARY SERVICE: PCCM  CHIEF COMPLAINT:  SOB, leg pain.  BRIEF PATIENT DESCRIPTION:  43 years old male resident of an assisted living facility with PMH relevant for schizoaffective disorder, polysubstance abuse, seizures, COPD,  malnutrition, GERD. Presented with SOB and found to be hypotensive despite 2 L of IVF's. Started on dopamine via peripheral IV by ED and  PCCM called to admit. No clear source of infection.   SIGNIFICANT EVENTS / STUDIES:  3/29 Admitted to PCCM with septic shock and HCAP. Resuscitated with IVFs, vasopressors. Intubated shortly after admission. Bicarbonate infusion initiated for severe metabolic acidosis 1/93 Persistent septic shock. Worsening renal function. Acidosis improved. HCO3 stopped 3/31 Off vasopressors. Severe agitation @ times. Fentanyl and dex infusions initiated. Not able to F/C. Oliguric with worsening renal indices. Foley cath changed out without improvement in Uo. No indication for HD at this time 4/1 Close to extubation, urine output slightly improved, will need to contact wife for discussion of goals of care 4/2 Still agitated, self-extubated, re-intubated successfully. Continued worsening renal function. No HCPA or family/significant other contact. Will consult with ethics committee to resume responsibility for [name] decisions.  4/3 Renal function no worse. Hepatic function improving.   LINES / TUBES: L IJ CVL 3/29 >>  ETT 3/29 >> 4/2, 4/2 (self extubated and re-intubated) >>    CULTURES: MRSA PCR 3/29 >> POS Urine 3/29 >> NG Blood 3/28 >> NGTD Blood 3/29 >> NGTD Tracheal 3/31 >> Gram stain neg,  NG  ANTIBIOTICS: Vanc 3/29 >> 4/2 Pip-tazo 3/29 >>    SUBJECTIVE:  RASS -3 to +3.  Pt not able to follow commands.    VITAL SIGNS: Temp:  [97.8 F (36.6 C)-98.8 F (37.1 C)] 97.9 F (36.6 C) (04/03 0342) Pulse Rate:   [86-124] 106 (04/03 0500) Resp:  [0-28] 15 (04/03 0500) BP: (66-149)/(38-86) 97/50 mmHg (04/03 0500) SpO2:  [81 %-100 %] 97 % (04/03 0500) FiO2 (%):  [40 %] 40 % (04/03 0315) Weight:  [63.5 kg (139 lb 15.9 oz)] 63.5 kg (139 lb 15.9 oz) (04/03 0500) HEMODYNAMICS: CVP:  [13 mmHg] 13 mmHg VENTILATOR SETTINGS: Vent Mode:  [-] PRVC FiO2 (%):  [40 %] 40 % Set Rate:  [16 bmp] 16 bmp Vt Set:  [600 mL] 600 mL PEEP:  [5 cmH20] 5 cmH20 Plateau Pressure:  [16 cmH20-24 cmH20] 24 cmH20 INTAKE / OUTPUT: Intake/Output     04/02 0701 - 04/03 0700   I.V. (mL/kg) 895 (14.1)   NG/GT 1065   IV Piggyback 300   Total Intake(mL/kg) 2260 (35.6)   Urine (mL/kg/hr) 370 (0.2)   Total Output 370   Net +1890         PHYSICAL EXAMINATION: General: mild agitation, Not F/C HEENT: WNL Cardiac: RRR  Lungs: No wheezes noted Abdomen: Soft, diminished BS Ext: Cool, no edema Neuro: No focal neurologic deficits. RASS -3 to +3  PULMONARY  Recent Labs Lab 01/09/14 0529 01/09/14 0625 01/09/14 0916 01/09/14 1413 01/09/14 1455  PHART 7.178*  --  7.058* 7.358  --   PCO2ART 37.3  --  40.1 29.0*  --   PO2ART 217.0*  --  60.0* 386.0*  --   HCO3 13.9*  --  11.3* 17.0*  --   TCO2 15  --  13 18  --   O2SAT 100.0 70.3 78.0 100.0 59.1    CBC  Recent Labs Lab 01/12/14  0500 01/13/14 0510 01/14/14 0658  HGB 9.2* 8.2* 8.1*  HCT 26.6* 23.6* 24.5*  WBC 24.0* 21.4* 33.1*  PLT 133*  132* 142* 140*    COAGULATION  Recent Labs Lab 01/09/14 0358 01/12/14 0500  INR 2.02* 2.06*    CARDIAC   Recent Labs Lab 01/09/14 0328  TROPONINI <0.30   No results found for this basename: PROBNP,  in the last 168 hours   CHEMISTRY  Recent Labs Lab 01/09/14 0358  01/11/14 0400 01/11/14 1306 01/12/14 0106 01/12/14 0500 01/13/14 0510 01/13/14 1000 01/13/14 1300 01/14/14 0500 01/14/14 1000  NA 142  < > 142  --   --  141 145 143  --  137  --   K 4.1  < > 3.8  --   --  3.6* 2.8* 3.2*  --  3.5*  --    CL 105  < > 99  --   --  100 103 102  --  99  --   CO2 15*  < > 23  --   --  21 23 22   --  20  --   GLUCOSE 120*  < > 122*  --   --  120* 121* 120*  --  127*  --   BUN 11  < > 26*  --   --  37* 45* 47*  --  51*  --   CREATININE 0.90  < > 2.88*  --   --  3.56* 4.29* 4.24*  --  4.57*  --   CALCIUM 5.9*  < > 6.2*  --   --  6.0* 5.7* 5.8*  --  5.9*  --   MG 1.4*  --   --  2.1 2.1  --   --   --  1.7  --  2.2  PHOS 4.4  --   --  6.1* 5.6*  --   --   --   --  3.7  --   < > = values in this interval not displayed. Estimated Creatinine Clearance: 18.7 ml/min (by C-G formula based on Cr of 4.57).   LIVER  Recent Labs Lab 01/09/14 0005 01/09/14 0358 01/11/14 0400 01/12/14 0500 01/14/14 0500  AST 18  --  3622* 801* 102*  ALT 10  --  2385* 1367* 528*  ALKPHOS 94  --  118* 109 96  BILITOT 0.4  --  0.6 0.4 1.0  PROT 6.9  --  4.6* 4.4* 4.9*  ALBUMIN 2.6*  --  1.9* 1.7* 1.5*  INR  --  2.02*  --  2.06*  --      INFECTIOUS  Recent Labs Lab 01/09/14 0020 01/09/14 0358 01/09/14 1123  LATICACIDVEN 4.47* 3.5* 4.7*     ENDOCRINE CBG (last 3)   Recent Labs  01/14/14 0024 01/14/14 0341 01/14/14 0821  GLUCAP 120* 124* 126*         IMAGING x48h  Dg Chest Port 1 View  01/14/2014   CLINICAL DATA:  Evaluate for pleural effusion.  EXAM: PORTABLE CHEST - 1 VIEW  COMPARISON:  DG CHEST 1V PORT dated 01/13/2014  FINDINGS: Endotracheal tube is 5.3 cm above the carina. Nasogastric tube extends into the abdomen. Central line tip in the upper SVC region. Improved aeration at the left lung base. There is atelectasis and pleural fluid at the left lung base. Persistent airspace disease with pleural fluid at the right lung base. Slightly improved aeration at the right lung base. Negative for a pneumothorax.  IMPRESSION: Slightly improved  aeration at both lung bases. Improved aeration could be related to decreased pleural fluid or layering pleural fluid.  Persistent airspace disease at the right lung  base.  Support apparatuses as described. Interval placement of a nasogastric tube.   Electronically Signed   By: Markus Daft M.D.   On: 01/14/2014 08:00   Dg Chest Port 1 View  01/13/2014   CLINICAL DATA:  Re-intubation.  EXAM: PORTABLE CHEST - 1 VIEW  COMPARISON:  01/13/2014  FINDINGS: Endotracheal tube tip lies 3.7 cm above the carina.  Bilateral airspace lung opacity and bilateral effusions are without significant change from the earlier study. Left IJ central line stable. Nasogastric tube has been removed.  IMPRESSION: 1. Endotracheal tube tip is well positioned 3.7 cm above the carina.   Electronically Signed   By: Lajean Manes M.D.   On: 01/13/2014 12:20   Dg Chest Port 1 View  01/13/2014   CLINICAL DATA:  Respiratory failure  EXAM: PORTABLE CHEST - 1 VIEW  COMPARISON:  DG CHEST 1V PORT dated 01/12/2014; DG CHEST 1V PORT dated 01/09/2014; DG CHEST 1V PORT dated 01/09/2014  FINDINGS: Grossly unchanged cardiac silhouette and mediastinal contours. Stable positioning of support apparatus. No pneumothorax. No change to slight worsening of right mid and bilateral lower lung heterogeneous/consolidative airspace opacities. A small right-sided effusion is suspected. No definite evidence of edema. Unchanged bones.  IMPRESSION: 1. Stable positioning of support apparatus.  No pneumothorax. 2. No change to slight worsening of bilateral mid and lower lung airspace opacities, right greater than left, findings worrisome for progression of multifocal infection.   Electronically Signed   By: Sandi Mariscal M.D.   On: 01/13/2014 07:42   Dg Abd Portable 1v  01/13/2014   CLINICAL DATA:  Gastric catheter placement  EXAM: PORTABLE ABDOMEN - 1 VIEW  COMPARISON:  01/13/2014 704 hr  FINDINGS: The gastric catheter is been advanced in the interval and now lies in the distal aspect of the stomach directed towards the pyloric channel. The bowel gas pattern is stable. No bony abnormality is seen.  IMPRESSION: Gastric catheter in the distal  stomach as described.   Electronically Signed   By: Inez Catalina M.D.   On: 01/13/2014 12:39   Dg Abd Portable 1v  01/13/2014   CLINICAL DATA:  Distended abdomen  EXAM: PORTABLE ABDOMEN - 1 VIEW  COMPARISON:  01/09/2014  FINDINGS: Nasogastric tube has its tip in the stomach. The gas pattern does not show evidence of ileus, obstruction or free air. No worrisome calcifications or bony findings.  IMPRESSION: Nasogastric tip in the body of the stomach. Unremarkable exam otherwise.   Electronically Signed   By: Nelson Chimes M.D.   On: 01/13/2014 07:20     ASSESSMENT / PLAN:  PULMONARY A: Acute resp failure due to HCAP requiring ventilator support  COPD - emphysema P:   Cont full vent support SBT today in AM Albuterol neb and duoneb PRN Ethics committee consult for health care decision making (Dr. Andria Frames [phone])  CARDIOVASCULAR A:  Septic shock, off pressor support P:  Restart pressors as needed to maintain MAP > 65 mmHg Restart IVFs as needed to maintain CVP 10-14  RENAL A:   Acute kidney failure, oligo-anuric -  likely due to ATN (severe sepsis) and recent vancomycin   Hypocalcemia  Metabolic acidosis, resolved P:   Monitor BMET intermittently Monitor I/Os Correct electrolytes as indicated Avoid nephrotoxins  GASTROINTESTINAL A:   H/O GERD, chronic PPI use Transaminitis secondary to severe sepsis -  improving P:   IV famotidine Tube feedings osmolite and pro-stat) initiated 3/31 Monitor CMP intermittently   HEMATOLOGIC A:   Normocytic Anemia, likely due to severe sepsis  Mild thrombocytopenia, stable- likely due to severe sepsis P:  SQ heparin DVT ppx Monitor CBC intermittently Transfuse per usual ICU guidelines  INFECTIOUS A:   Severe sepsis  (AKI, transaminitis, and encephalopathy) RLL HCAP, NOS MRSA Colonization P:   Micro and abx as above  ENDOCRINE A:   Hypothyroidism Hyperglycemia - improving  P:   Cont L-thyroxine  Start SSI if CBG >180 CBG Q 6  hr  NEUROLOGIC/PSYCHOSOCIAL A:   Acute encephalopathy with severe agitation Schizoaffective disorder Seizure disorder No HCPOA identified P:   Cont precedex and fentanyl infusions Cont Remeron, Zyprexa, Cogentin  Cont Haldol and versed PRN Cont IV phenytoin, levels low Ethic consult requested  Code: Full   Dr Ricarda Frame Rabbani PGY-1, [place] 01/14/14  STAFF NOTE: I, Dr Ann Lions have personally reviewed patient's available data, including medical history, events of note, physical examination and test results as part of my evaluation. I have discussed with resident/NP and other care providers such as pharmacist, RN and RRT.  In addition,  I personally evaluated patient and elicited key findings of acute resp failure, failure to wean - due to agitation, worsening renal failure but not a good candidate for HD. No health care surrogates or contacts available. Ethics consult for guidance with decision making requested  Rest per NP/medical resident whose note is outlined above and that I agree with  The patient is critically ill with multiple organ systems failure and requires high complexity decision making for assessment and support, frequent evaluation and titration of therapies, application of advanced monitoring technologies and extensive interpretation of multiple databases.   Critical Care Time devoted to patient care services described in this note is  35  Minutes.  Dr. Brand Males, M.D., [facility].C.P Pulmonary and Critical Care Medicine Staff Physician [place] [facility] Pager: [phone], If no answer or between  15:00h - 7:00h: call [phone]  01/14/2014 11:59 AM

## 2014-01-14 NOTE — Clinical Social Work Note (Signed)
CSW continuing to follow and assist as necessary.  Ethics has been consulted re: decision making.  Pt has no identifiable family.  CSW has exhausted all resources of possibly locating decision maker.  Nonnie Done, [place] [phone]  Clinical Social Work

## 2014-01-15 ENCOUNTER — Inpatient Hospital Stay (HOSPITAL_COMMUNITY): Payer: Medicare Other

## 2014-01-15 DIAGNOSIS — I959 Hypotension, unspecified: Secondary | ICD-10-CM

## 2014-01-15 LAB — COMPREHENSIVE METABOLIC PANEL
ALBUMIN: 1.3 g/dL — AB (ref 3.5–5.2)
ALT: 392 U/L — ABNORMAL HIGH (ref 0–53)
AST: 76 U/L — ABNORMAL HIGH (ref 0–37)
Alkaline Phosphatase: 124 U/L — ABNORMAL HIGH (ref 39–117)
BUN: 60 mg/dL — AB (ref 6–23)
CO2: 17 mEq/L — ABNORMAL LOW (ref 19–32)
CREATININE: 4.81 mg/dL — AB (ref 0.50–1.35)
Calcium: 6.6 mg/dL — ABNORMAL LOW (ref 8.4–10.5)
Chloride: 99 mEq/L (ref 96–112)
GFR calc Af Amer: 16 mL/min — ABNORMAL LOW (ref >90)
GFR calc non Af Amer: 14 mL/min — ABNORMAL LOW (ref >90)
Glucose, Bld: 109 mg/dL — ABNORMAL HIGH (ref 70–99)
Potassium: 4 mEq/L (ref 3.7–5.3)
Sodium: 140 mEq/L (ref 137–147)
TOTAL PROTEIN: 4.9 g/dL — AB (ref 6.0–8.3)
Total Bilirubin: 0.6 mg/dL (ref 0.3–1.2)

## 2014-01-15 LAB — GLUCOSE, CAPILLARY
GLUCOSE-CAPILLARY: 72 mg/dL (ref 70–99)
GLUCOSE-CAPILLARY: 100 mg/dL — AB (ref 70–99)
Glucose-Capillary: 119 mg/dL — ABNORMAL HIGH (ref 70–99)
Glucose-Capillary: 121 mg/dL — ABNORMAL HIGH (ref 70–99)
Glucose-Capillary: 136 mg/dL — ABNORMAL HIGH (ref 70–99)
Glucose-Capillary: 81 mg/dL (ref 70–99)

## 2014-01-15 LAB — CULTURE, BLOOD (ROUTINE X 2)
CULTURE: NO GROWTH
Culture: NO GROWTH

## 2014-01-15 LAB — MAGNESIUM: Magnesium: 2.2 mg/dL (ref 1.5–2.5)

## 2014-01-15 LAB — PHOSPHORUS: PHOSPHORUS: 4.3 mg/dL (ref 2.3–4.6)

## 2014-01-15 LAB — PROCALCITONIN: Procalcitonin: 2.43 ng/mL

## 2014-01-15 MED ORDER — SODIUM CHLORIDE 0.9 % IV SOLN
Freq: Once | INTRAVENOUS | Status: AC
  Administered 2014-01-15: 13:00:00 via INTRAVENOUS

## 2014-01-15 MED ORDER — PHENYLEPHRINE HCL 10 MG/ML IJ SOLN
30.0000 ug/min | INTRAVENOUS | Status: DC
  Administered 2014-01-15 – 2014-01-16 (×2): 60 ug/min via INTRAVENOUS
  Administered 2014-01-16: 200 ug/min via INTRAVENOUS
  Administered 2014-01-17 (×2): 180 ug/min via INTRAVENOUS
  Filled 2014-01-15 (×6): qty 4

## 2014-01-15 MED ORDER — SODIUM CHLORIDE 0.9 % IV BOLUS (SEPSIS)
500.0000 mL | Freq: Once | INTRAVENOUS | Status: AC
  Administered 2014-01-15: 500 mL via INTRAVENOUS

## 2014-01-15 MED ORDER — PHENYLEPHRINE HCL 10 MG/ML IJ SOLN
30.0000 ug/min | INTRAVENOUS | Status: DC
  Administered 2014-01-15: 40 ug/min via INTRAVENOUS
  Administered 2014-01-15: 10 ug/min via INTRAVENOUS
  Filled 2014-01-15 (×2): qty 1

## 2014-01-15 MED ORDER — SODIUM CHLORIDE 0.9 % IV SOLN
Freq: Once | INTRAVENOUS | Status: AC
  Administered 2014-01-15: 09:00:00 via INTRAVENOUS

## 2014-01-15 NOTE — Progress Notes (Signed)
PULMONARY / CRITICAL CARE MEDICINE   Name: [name] [name] MRN: [id number] DOB: [birth year]    ADMISSION DATE:  01/04/2014  PRIMARY SERVICE: PCCM  CHIEF COMPLAINT:  SOB, leg pain.  BRIEF PATIENT DESCRIPTION:  43 years old male resident of an assisted living facility with PMH relevant for schizoaffective disorder, polysubstance abuse, seizures, COPD,  malnutrition, GERD. Presented with SOB and found to be hypotensive despite 2 L of IVF's. Started on dopamine via peripheral IV by ED and  PCCM called to admit. No clear source of infection.   SIGNIFICANT EVENTS / STUDIES:  3/29 Admitted to PCCM with septic shock and HCAP. Resuscitated with IVFs, vasopressors. Intubated shortly after admission. Bicarbonate infusion initiated for severe metabolic acidosis 5/18 Persistent septic shock. Worsening renal function. Acidosis improved. HCO3 stopped 3/31 Off vasopressors. Severe agitation @ times. Fentanyl and dex infusions initiated. Not able to F/C. Oliguric with worsening renal indices. Foley cath changed out without improvement in Uo. No indication for HD at this time 4/1 Close to extubation, urine output slightly improved, will need to contact wife for discussion of goals of care 4/2 Still agitated, self-extubated, re-intubated successfully. Continued worsening renal function. No HCPA or family/significant other contact. Will consult with ethics committee to resume responsibility for [facility] decisions.  4/3 Renal function no worse. Hepatic function improving.   LINES / TUBES: L IJ CVL 3/29 >>  ETT 3/29 >> 4/2, 4/2 (self extubated and re-intubated) >>    CULTURES: MRSA PCR 3/29 >> POS Urine 3/29 >> NG Blood 3/28 >> NGTD Blood 3/29 >> NGTD Tracheal 3/31 >> Gram stain neg,  NG    ANTIBIOTICS: Vanc 3/29 >> 4/2 Pip-tazo 3/29 >>    SUBJECTIVE:  RASS -3 to +3.  Pt not able to follow commands.  Hypotensive despite 2L bolus and started neo. Yet to be seen by ethics consult. approcahing  Oligura Diarheea +. C Diff negative  VITAL SIGNS: Temp:  [97.2 F (36.2 C)-98 F (36.7 C)] 98 F (36.7 C) (04/04 1203) Pulse Rate:  [77-115] 84 (04/04 1330) Resp:  [14-20] 15 (04/04 1330) BP: (56-119)/(24-89) 56/41 mmHg (04/04 1300) SpO2:  [73 %-100 %] 98 % (04/04 1330) FiO2 (%):  [40 %-50 %] 50 % (04/04 1259) HEMODYNAMICS:   VENTILATOR SETTINGS: Vent Mode:  [-] PRVC FiO2 (%):  [40 %-50 %] 50 % Set Rate:  [16 bmp] 16 bmp Vt Set:  [600 mL] 600 mL PEEP:  [5 cmH20] 5 cmH20 Plateau Pressure:  [17 cmH20-30 cmH20] 19 cmH20 INTAKE / OUTPUT: Intake/Output     04/03 0701 - 04/04 0700 04/04 0701 - 04/05 0700   I.V. (mL/kg) 753.2 (11.9) 1221 (19.2)   NG/GT 1210 330   IV Piggyback 250    Total Intake(mL/kg) 2213.2 (34.9) 1551 (24.4)   Urine (mL/kg/hr) 145 (0.1) 50 (0.1)   Emesis/NG output 25 (0)    Stool 531 (0.3) 800 (1.7)   Total Output 701 850   Net +1512.2 +701          PHYSICAL EXAMINATION: General: Looks critically ill HEENT: WNL Cardiac: RRR  Lungs: No wheezes noted Abdomen: Soft, diminished BS Ext: Cool, no edema Neuro: No focal neurologic deficits. RASS -3 to +3  PULMONARY  Recent Labs Lab 01/09/14 0529 01/09/14 0625 01/09/14 0916 01/09/14 1413 01/09/14 1455  PHART 7.178*  --  7.058* 7.358  --   PCO2ART 37.3  --  40.1 29.0*  --   PO2ART 217.0*  --  60.0* 386.0*  --   HCO3 13.9*  --  11.3* 17.0*  --   TCO2 15  --  13 18  --   O2SAT 100.0 70.3 78.0 100.0 59.1    CBC  Recent Labs Lab 01/12/14 0500 01/13/14 0510 01/14/14 0658  HGB 9.2* 8.2* 8.1*  HCT 26.6* 23.6* 24.5*  WBC 24.0* 21.4* 33.1*  PLT 133*  132* 142* 140*    COAGULATION  Recent Labs Lab 01/09/14 0358 01/12/14 0500  INR 2.02* 2.06*    CARDIAC    Recent Labs Lab 01/09/14 0328  TROPONINI <0.30   No results found for this basename: PROBNP,  in the last 168 hours   CHEMISTRY  Recent Labs Lab 01/09/14 0358  01/11/14 1306 01/12/14 0106 01/12/14 0500 01/13/14 0510  01/13/14 1000 01/13/14 1300 01/14/14 0500 01/14/14 1000 01/15/14 0500  NA 142  < >  --   --  141 145 143  --  137  --  140  K 4.1  < >  --   --  3.6* 2.8* 3.2*  --  3.5*  --  4.0  CL 105  < >  --   --  100 103 102  --  99  --  99  CO2 15*  < >  --   --  21 23 22   --  20  --  17*  GLUCOSE 120*  < >  --   --  120* 121* 120*  --  127*  --  109*  BUN 11  < >  --   --  37* 45* 47*  --  51*  --  60*  CREATININE 0.90  < >  --   --  3.56* 4.29* 4.24*  --  4.57*  --  4.81*  CALCIUM 5.9*  < >  --   --  6.0* 5.7* 5.8*  --  5.9*  --  6.6*  MG 1.4*  --  2.1 2.1  --   --   --  1.7  --  2.2 2.2  PHOS 4.4  --  6.1* 5.6*  --   --   --   --  3.7  --  4.3  < > = values in this interval not displayed. Estimated Creatinine Clearance: 17.8 ml/min (by C-G formula based on Cr of 4.81).   LIVER  Recent Labs Lab 01/09/14 0005 01/09/14 0358 01/11/14 0400 01/12/14 0500 01/14/14 0500 01/15/14 0500  AST 18  --  3622* 801* 102* 76*  ALT 10  --  2385* 1367* 528* 392*  ALKPHOS 94  --  118* 109 96 124*  BILITOT 0.4  --  0.6 0.4 1.0 0.6  PROT 6.9  --  4.6* 4.4* 4.9* 4.9*  ALBUMIN 2.6*  --  1.9* 1.7* 1.5* 1.3*  INR  --  2.02*  --  2.06*  --   --      INFECTIOUS  Recent Labs Lab 01/09/14 0020 01/09/14 0358 01/09/14 1123  LATICACIDVEN 4.47* 3.5* 4.7*     ENDOCRINE CBG (last 3)   Recent Labs  01/15/14 0357 01/15/14 0719 01/15/14 1140  GLUCAP 121* 119* 81         IMAGING x48h  Dg Chest Port 1 View  01/15/2014   CLINICAL DATA:  Respiratory failure.  EXAM: PORTABLE CHEST - 1 VIEW  COMPARISON:  01/14/2014, 01/13/2014, 01/12/2014, 01/11/2014, and 01/09/2014  FINDINGS: Endotracheal tube, NG tube, and central catheter appear in good position. There is recurrent pulmonary edema in the left perihilar region. Moderate right pleural effusion has slightly increased. Pulmonary  edema at the right base is unchanged. Slight atelectasis at the left base has increased.  IMPRESSION: Recurrent pulmonary  edema with slight increase in the right effusion.   Electronically Signed   By: Rozetta Nunnery M.D.   On: 01/15/2014 08:15   Dg Chest Port 1 View  01/14/2014   CLINICAL DATA:  Evaluate for pleural effusion.  EXAM: PORTABLE CHEST - 1 VIEW  COMPARISON:  DG CHEST 1V PORT dated 01/13/2014  FINDINGS: Endotracheal tube is 5.3 cm above the carina. Nasogastric tube extends into the abdomen. Central line tip in the upper SVC region. Improved aeration at the left lung base. There is atelectasis and pleural fluid at the left lung base. Persistent airspace disease with pleural fluid at the right lung base. Slightly improved aeration at the right lung base. Negative for a pneumothorax.  IMPRESSION: Slightly improved aeration at both lung bases. Improved aeration could be related to decreased pleural fluid or layering pleural fluid.  Persistent airspace disease at the right lung base.  Support apparatuses as described. Interval placement of a nasogastric tube.   Electronically Signed   By: Markus Daft M.D.   On: 01/14/2014 08:00     ASSESSMENT / PLAN:  PULMONARY A: Acute resp failure due to HCAP requiring ventilator support  COPD - emphysema    - agitated and does not meet SBT criteria   P:   Cont full vent support SBT today in AM Albuterol neb and duoneb PRN Ethics committee consult for health care decision making (Dr. Andria Frames [phone])  CARDIOVASCULAR A:  Septic shock,    - back on perssors 4.4/15. ? Due to volume v sepsis   P:  Restart pressors  maintain MAP > 65 mmHg Restart IVFs as needed to maintain CVP 10-14  RENAL A:   Acute kidney failure, oligo-anuric -  likely due to ATN (severe sepsis) and recent vancomycin     - worsening   P:   Monitor BMET intermittently Monitor I/Os Correct electrolytes as indicated Avoid nephrotoxins Not a good HD  Or CRRT candidate  GASTROINTESTINAL A:   H/O GERD, chronic PPI use Transaminitis secondary to severe sepsis - improving   - new diarrhea, c  diff negative P:   IV famotidine Tube feedings osmolite and pro-stat) initiated 3/31 Monitor CMP intermittently  If persists, adjust tube feeds   HEMATOLOGIC A:   Normocytic Anemia, likely due to severe sepsis  Mild thrombocytopenia, stable- likely due to severe sepsis P:  SQ heparin DVT ppx Monitor CBC intermittently Transfuse per usual ICU guidelines  INFECTIOUS A:   Severe sepsis  (AKI, transaminitis, and encephalopathy) RLL HCAP, NOS MRSA Colonization P:   Micro and abx as above Recheck PCT  ENDOCRINE A:   Hypothyroidism Hyperglycemia - improving  P:   Cont L-thyroxine  Start SSI if CBG >180 CBG Q 6 hr  NEUROLOGIC/PSYCHOSOCIAL A:   Acute encephalopathy with severe agitation Schizoaffective disorder Seizure disorder  No HCPOA identified P:   Cont precedex and fentanyl infusions Cont Remeron, Zyprexa, Cogentin  Cont Haldol and versed PRN Cont IV phenytoin, levels low Ethic consult requested    The patient is critically ill with multiple organ systems failure and requires high complexity decision making for assessment and support, frequent evaluation and titration of therapies, application of advanced monitoring technologies and extensive interpretation of multiple databases.   Critical Care Time devoted to patient care services described in this note is  35  Minutes.  Dr. Brand Males, M.D., [facility].C.P Pulmonary and  Critical Care Medicine Staff Physician [place] [facility] Pager: [phone], If no answer or between  15:00h - 7:00h: call [phone]  01/15/2014 2:22 PM

## 2014-01-15 NOTE — Progress Notes (Signed)
bp low today. See vitals. Given total of 2 liters normal saline per md order. bp still slow. neosynephrine gtt started.

## 2014-01-16 ENCOUNTER — Inpatient Hospital Stay (HOSPITAL_COMMUNITY): Payer: Medicare Other

## 2014-01-16 DIAGNOSIS — A4102 Sepsis due to Methicillin resistant Staphylococcus aureus: Secondary | ICD-10-CM | POA: Diagnosis not present

## 2014-01-16 DIAGNOSIS — J9600 Acute respiratory failure, unspecified whether with hypoxia or hypercapnia: Secondary | ICD-10-CM

## 2014-01-16 DIAGNOSIS — R0602 Shortness of breath: Secondary | ICD-10-CM | POA: Diagnosis not present

## 2014-01-16 LAB — POCT I-STAT 3, ART BLOOD GAS (G3+)
ACID-BASE DEFICIT: 25 mmol/L — AB (ref 0.0–2.0)
Acid-base deficit: 24 mmol/L — ABNORMAL HIGH (ref 0.0–2.0)
Acid-base deficit: 20 mmol/L — ABNORMAL HIGH (ref 0.0–2.0)
Bicarbonate: 7.7 mEq/L — ABNORMAL LOW (ref 20.0–24.0)
Bicarbonate: 8.5 meq/L — ABNORMAL LOW (ref 20.0–24.0)
Bicarbonate: 10.6 meq/L — ABNORMAL LOW (ref 20.0–24.0)
O2 SAT: 43 %
O2 Saturation: 58 %
O2 Saturation: 100 %
PCO2 ART: 42 mmHg (ref 35.0–45.0)
Patient temperature: 97.6
Patient temperature: 97.5
Patient temperature: 97.5
TCO2: 9 mmol/L (ref 0–100)
TCO2: 10 mmol/L (ref 0–100)
TCO2: 12 mmol/L (ref 0–100)
pCO2 arterial: 47.4 mmHg — ABNORMAL HIGH (ref 35.0–45.0)
pCO2 arterial: 44 mmHg (ref 35.0–45.0)
pH, Arterial: 6.864 — CL (ref 7.350–7.450)
pH, Arterial: 6.858 — CL (ref 7.350–7.450)
pH, Arterial: 6.987 — CL (ref 7.350–7.450)
pO2, Arterial: 40 mmHg — ABNORMAL LOW (ref 80.0–100.0)
pO2, Arterial: 50 mmHg — ABNORMAL LOW (ref 80.0–100.0)
pO2, Arterial: 388 mmHg — ABNORMAL HIGH (ref 80.0–100.0)

## 2014-01-16 LAB — TROPONIN I: Troponin I: 0.31 ng/mL

## 2014-01-16 LAB — COMPREHENSIVE METABOLIC PANEL
ALK PHOS: 133 U/L — AB (ref 39–117)
ALT: 560 U/L — AB (ref 0–53)
AST: 805 U/L — ABNORMAL HIGH (ref 0–37)
Albumin: 1.2 g/dL — ABNORMAL LOW (ref 3.5–5.2)
BUN: 58 mg/dL — AB (ref 6–23)
CO2: 8 meq/L — AB (ref 19–32)
Calcium: 6.9 mg/dL — ABNORMAL LOW (ref 8.4–10.5)
Chloride: 95 mEq/L — ABNORMAL LOW (ref 96–112)
Creatinine, Ser: 5.08 mg/dL — ABNORMAL HIGH (ref 0.50–1.35)
GFR, EST AFRICAN AMERICAN: 15 mL/min — AB (ref >90)
GFR, EST NON AFRICAN AMERICAN: 13 mL/min — AB (ref >90)
GLUCOSE: 231 mg/dL — AB (ref 70–99)
POTASSIUM: 4.6 meq/L (ref 3.7–5.3)
SODIUM: 137 meq/L (ref 137–147)
Total Bilirubin: 0.7 mg/dL (ref 0.3–1.2)
Total Protein: 4.7 g/dL — ABNORMAL LOW (ref 6.0–8.3)

## 2014-01-16 LAB — CARBOXYHEMOGLOBIN
Carboxyhemoglobin: 1.3 % (ref 0.5–1.5)
Methemoglobin: 1.2 % (ref 0.0–1.5)
O2 Saturation: 60.9 %
Total hemoglobin: 7.4 g/dL — ABNORMAL LOW (ref 13.5–18.0)

## 2014-01-16 LAB — CBC
HCT: 24.6 % — ABNORMAL LOW (ref 39.0–52.0)
HEMOGLOBIN: 7.9 g/dL — AB (ref 13.0–17.0)
MCH: 31 pg (ref 26.0–34.0)
MCHC: 32.1 g/dL (ref 30.0–36.0)
MCV: 96.5 fL (ref 78.0–100.0)
PLATELETS: 206 10*3/uL (ref 150–400)
RBC: 2.55 MIL/uL — ABNORMAL LOW (ref 4.22–5.81)
RDW: 17.1 % — ABNORMAL HIGH (ref 11.5–15.5)
WBC: 61.1 10*3/uL (ref 4.0–10.5)

## 2014-01-16 LAB — COMPREHENSIVE METABOLIC PANEL WITH GFR
ALT: 578 U/L — ABNORMAL HIGH (ref 0–53)
AST: 852 U/L — ABNORMAL HIGH (ref 0–37)
Albumin: 1.3 g/dL — ABNORMAL LOW (ref 3.5–5.2)
Alkaline Phosphatase: 143 U/L — ABNORMAL HIGH (ref 39–117)
BUN: 54 mg/dL — ABNORMAL HIGH (ref 6–23)
CO2: 9 meq/L — CL (ref 19–32)
Calcium: 6.7 mg/dL — ABNORMAL LOW (ref 8.4–10.5)
Chloride: 91 meq/L — ABNORMAL LOW (ref 96–112)
Creatinine, Ser: 4.98 mg/dL — ABNORMAL HIGH (ref 0.50–1.35)
GFR calc Af Amer: 15 mL/min — ABNORMAL LOW
GFR calc non Af Amer: 13 mL/min — ABNORMAL LOW
Glucose, Bld: 198 mg/dL — ABNORMAL HIGH (ref 70–99)
Potassium: 4.2 meq/L (ref 3.7–5.3)
Sodium: 133 meq/L — ABNORMAL LOW (ref 137–147)
Total Bilirubin: 0.9 mg/dL (ref 0.3–1.2)
Total Protein: 4.8 g/dL — ABNORMAL LOW (ref 6.0–8.3)

## 2014-01-16 LAB — PRO B NATRIURETIC PEPTIDE: Pro B Natriuretic peptide (BNP): 46009 pg/mL — ABNORMAL HIGH (ref 0–125)

## 2014-01-16 LAB — GLUCOSE, CAPILLARY
GLUCOSE-CAPILLARY: 187 mg/dL — AB (ref 70–99)
GLUCOSE-CAPILLARY: 140 mg/dL — AB (ref 70–99)
Glucose-Capillary: 111 mg/dL — ABNORMAL HIGH (ref 70–99)
Glucose-Capillary: 150 mg/dL — ABNORMAL HIGH (ref 70–99)
Glucose-Capillary: 48 mg/dL — ABNORMAL LOW (ref 70–99)
Glucose-Capillary: 65 mg/dL — ABNORMAL LOW (ref 70–99)
Glucose-Capillary: 86 mg/dL (ref 70–99)
Glucose-Capillary: 94 mg/dL (ref 70–99)

## 2014-01-16 LAB — CK: CK TOTAL: 348 U/L — AB (ref 7–232)

## 2014-01-16 LAB — AMYLASE: AMYLASE: 149 U/L — AB (ref 0–105)

## 2014-01-16 LAB — POCT ACTIVATED CLOTTING TIME: Activated Clotting Time: 221 s

## 2014-01-16 LAB — MAGNESIUM
Magnesium: 2.5 mg/dL (ref 1.5–2.5)
Magnesium: 2.6 mg/dL — ABNORMAL HIGH (ref 1.5–2.5)

## 2014-01-16 LAB — VANCOMYCIN, RANDOM: VANCOMYCIN RM: 24.1 ug/mL

## 2014-01-16 LAB — PROCALCITONIN: Procalcitonin: 3.5 ng/mL

## 2014-01-16 LAB — PHENYTOIN LEVEL, TOTAL: Phenytoin Lvl: 5.3 ug/mL — ABNORMAL LOW (ref 10.0–20.0)

## 2014-01-16 LAB — PROTIME-INR
INR: 2.5 — ABNORMAL HIGH (ref 0.00–1.49)
Prothrombin Time: 26.2 seconds — ABNORMAL HIGH (ref 11.6–15.2)

## 2014-01-16 LAB — LIPASE, BLOOD: LIPASE: 457 U/L — AB (ref 11–59)

## 2014-01-16 LAB — LACTIC ACID, PLASMA: LACTIC ACID, VENOUS: 11.6 mmol/L — AB (ref 0.5–2.2)

## 2014-01-16 LAB — PHOSPHORUS: PHOSPHORUS: 7.5 mg/dL — AB (ref 2.3–4.6)

## 2014-01-16 MED ORDER — PRISMASOL BGK 4/2.5 32-4-2.5 MEQ/L IV SOLN
INTRAVENOUS | Status: DC
  Administered 2014-01-16 – 2014-01-25 (×19): via INTRAVENOUS_CENTRAL
  Filled 2014-01-16 (×33): qty 5000

## 2014-01-16 MED ORDER — DEXTROSE 5 % IV SOLN
INTRAVENOUS | Status: DC
  Administered 2014-01-16: 20 mL via INTRAVENOUS
  Administered 2014-01-19 – 2014-01-20 (×2): via INTRAVENOUS

## 2014-01-16 MED ORDER — SODIUM BICARBONATE 8.4 % IV SOLN
INTRAVENOUS | Status: AC
  Filled 2014-01-16: qty 50

## 2014-01-16 MED ORDER — HYDROCORTISONE NA SUCCINATE PF 100 MG IJ SOLR
100.0000 mg | Freq: Three times a day (TID) | INTRAMUSCULAR | Status: DC
  Administered 2014-01-16 – 2014-01-17 (×2): 100 mg via INTRAVENOUS
  Filled 2014-01-16 (×5): qty 2

## 2014-01-16 MED ORDER — HEPARIN (PORCINE) 2000 UNITS/L FOR CRRT
INTRAVENOUS_CENTRAL | Status: DC | PRN
  Filled 2014-01-16: qty 1000

## 2014-01-16 MED ORDER — PIPERACILLIN-TAZOBACTAM 3.375 G IVPB 30 MIN
3.3750 g | Freq: Four times a day (QID) | INTRAVENOUS | Status: DC
  Administered 2014-01-16 – 2014-01-17 (×2): 3.375 g via INTRAVENOUS
  Filled 2014-01-16 (×4): qty 50

## 2014-01-16 MED ORDER — PRISMASOL BGK 4/2.5 32-4-2.5 MEQ/L IV SOLN
INTRAVENOUS | Status: DC
  Administered 2014-01-16 – 2014-01-25 (×47): via INTRAVENOUS_CENTRAL
  Filled 2014-01-16 (×78): qty 5000

## 2014-01-16 MED ORDER — FUROSEMIDE 10 MG/ML IJ SOLN: 40.0000 mg | Freq: Once | INTRAMUSCULAR | Status: DC

## 2014-01-16 MED ORDER — VANCOMYCIN 50 MG/ML ORAL SOLUTION
500.0000 mg | Freq: Four times a day (QID) | ORAL | Status: DC
  Administered 2014-01-17 – 2014-01-18 (×6): 500 mg via ORAL
  Filled 2014-01-16 (×10): qty 10

## 2014-01-16 MED ORDER — HEPARIN SODIUM (PORCINE) 1000 UNIT/ML DIALYSIS
1000.0000 [IU] | INTRAMUSCULAR | Status: DC | PRN
  Administered 2014-01-16 (×2): 1200 [IU] via INTRAVENOUS_CENTRAL
  Filled 2014-01-16: qty 3
  Filled 2014-01-16: qty 6
  Filled 2014-01-16: qty 3
  Filled 2014-01-16: qty 1

## 2014-01-16 MED ORDER — SODIUM CHLORIDE 0.9 % IV SOLN
1.0000 g | Freq: Once | INTRAVENOUS | Status: AC
  Administered 2014-01-16: 1 g via INTRAVENOUS
  Filled 2014-01-16: qty 10

## 2014-01-16 MED ORDER — FREE WATER: 150.0000 mL | Freq: Three times a day (TID) | Status: DC

## 2014-01-16 MED ORDER — SODIUM CHLORIDE 0.9 % IJ SOLN
250.0000 [IU]/h | INTRAMUSCULAR | Status: DC
  Filled 2014-01-16 (×2): qty 2

## 2014-01-16 MED ORDER — VANCOMYCIN 50 MG/ML ORAL SOLUTION
250.0000 mg | Freq: Four times a day (QID) | ORAL | Status: DC
  Filled 2014-01-16 (×3): qty 5

## 2014-01-16 MED ORDER — DEXTROSE 50 % IV SOLN
50.0000 mL | Freq: Once | INTRAVENOUS | Status: AC
  Administered 2014-01-16: 50 mL via INTRAVENOUS
  Filled 2014-01-16: qty 50

## 2014-01-16 MED ORDER — FUROSEMIDE 10 MG/ML IJ SOLN: 80.0000 mg | Freq: Once | INTRAMUSCULAR | Status: DC

## 2014-01-16 MED ORDER — PIPERACILLIN-TAZOBACTAM 3.375 G IVPB
3.3750 g | Freq: Four times a day (QID) | INTRAVENOUS | Status: DC
  Administered 2014-01-16: 3.375 g via INTRAVENOUS
  Filled 2014-01-16 (×3): qty 50

## 2014-01-16 MED ORDER — SODIUM BICARBONATE 8.4 % IV SOLN
INTRAVENOUS | Status: DC
  Administered 2014-01-16 – 2014-01-18 (×4): via INTRAVENOUS
  Filled 2014-01-16 (×10): qty 150

## 2014-01-16 MED ORDER — FENTANYL CITRATE 0.05 MG/ML IJ SOLN
50.0000 ug | INTRAMUSCULAR | Status: DC | PRN
  Administered 2014-01-24 – 2014-01-25 (×4): 50 ug via INTRAVENOUS
  Filled 2014-01-16 (×4): qty 2

## 2014-01-16 MED ORDER — SODIUM CHLORIDE 0.9 % IV SOLN
0.0000 mg/h | INTRAVENOUS | Status: DC
  Administered 2014-01-16: 1 mg/h via INTRAVENOUS
  Filled 2014-01-16: qty 10

## 2014-01-16 MED ORDER — METRONIDAZOLE IN NACL 5-0.79 MG/ML-% IV SOLN
500.0000 mg | Freq: Three times a day (TID) | INTRAVENOUS | Status: DC
  Administered 2014-01-16 – 2014-01-18 (×5): 500 mg via INTRAVENOUS
  Filled 2014-01-16 (×7): qty 100

## 2014-01-16 MED ORDER — MIDAZOLAM BOLUS VIA INFUSION
1.0000 mg | INTRAVENOUS | Status: DC | PRN
  Filled 2014-01-16: qty 2

## 2014-01-16 MED ORDER — MIDAZOLAM HCL 2 MG/2ML IJ SOLN
4.0000 mg | INTRAMUSCULAR | Status: AC
  Administered 2014-01-16: 4 mg via INTRAVENOUS

## 2014-01-16 MED ORDER — SODIUM CHLORIDE 0.9 % IV SOLN
100.0000 mg | Freq: Every day | INTRAVENOUS | Status: DC
  Administered 2014-01-16 – 2014-01-19 (×4): 100 mg via INTRAVENOUS
  Filled 2014-01-16 (×4): qty 100

## 2014-01-16 MED ORDER — DEXTROSE 50 % IV SOLN
INTRAVENOUS | Status: AC
  Filled 2014-01-16: qty 50

## 2014-01-16 MED ORDER — NOREPINEPHRINE BITARTRATE 1 MG/ML IJ SOLN
2.0000 ug/min | INTRAVENOUS | Status: DC
  Administered 2014-01-16: 3 ug/min via INTRAVENOUS
  Administered 2014-01-17: 11 ug/min via INTRAVENOUS
  Filled 2014-01-16 (×2): qty 4

## 2014-01-16 MED ORDER — PRISMASOL BGK 4/2.5 32-4-2.5 MEQ/L IV SOLN
INTRAVENOUS | Status: DC
  Administered 2014-01-16 – 2014-01-25 (×27): via INTRAVENOUS_CENTRAL
  Filled 2014-01-16 (×38): qty 5000

## 2014-01-16 MED ORDER — SODIUM BICARBONATE 8.4 % IV SOLN
50.0000 meq | Freq: Once | INTRAVENOUS | Status: AC
  Administered 2014-01-16: 50 meq via INTRAVENOUS
  Filled 2014-01-16: qty 50

## 2014-01-16 MED ORDER — METRONIDAZOLE 500 MG PO TABS
500.0000 mg | ORAL_TABLET | Freq: Three times a day (TID) | ORAL | Status: DC
Start: 2014-01-16 — End: 2014-01-16
  Filled 2014-01-16 (×2): qty 1

## 2014-01-16 NOTE — Progress Notes (Signed)
CRITICAL VALUE ALERT  Critical value received:  CO2=9 Trop= 0.31  Date of notification:  01/16/14  Time of notification:  2115  Critical value read back:yes  Nurse who received alert: Delphia Grates RN  MD notified (1st page):  Dr Lowry Ram  Time of first page:  2145  MD notified (2nd page):  Time of second page:  Responding MD:  Dr Lowry Ram  Time MD responded:  2145

## 2014-01-16 NOTE — Progress Notes (Addendum)
PULMONARY / CRITICAL CARE MEDICINE   Name: [name] [name] MRN: [id number] DOB: July 29, 1971    ADMISSION DATE:  12/18/2013  PRIMARY SERVICE: PCCM  CHIEF COMPLAINT:  SOB, leg pain.  BRIEF PATIENT DESCRIPTION:  43 years old male resident of an assisted living facility with PMH relevant for schizoaffective disorder, polysubstance abuse, seizures, COPD,  malnutrition, GERD. Presented with SOB and found to be hypotensive despite 2 L of IVF's. Started on dopamine via peripheral IV by ED and  PCCM called to admit. No clear source of infection.   SIGNIFICANT EVENTS / STUDIES:  3/29 Admitted to PCCM with septic shock and HCAP. Resuscitated with IVFs, vasopressors. Intubated shortly after admission. Bicarbonate infusion initiated for severe metabolic acidosis 5/09 Persistent septic shock. Worsening renal function. Acidosis improved. HCO3 stopped 3/31 Off vasopressors. Severe agitation @ times. Fentanyl and dex infusions initiated. Not able to F/C. Oliguric with worsening renal indices. Foley cath changed out without improvement in Uo. No indication for HD at this time 4/1 Close to extubation, urine output slightly improved, will need to contact wife for discussion of goals of care 4/2 Still agitated, self-extubated, re-intubated successfully. Continued worsening renal function. No HCPA or family/significant other contact. Will consult with ethics committee to resume responsibility for [facility] decisions.  4/3 Renal function no worse. Hepatic function improving.  4/4 Started phenylephrine  4/5 Continued hypotension and oliguria.   LINES / TUBES: L IJ CVL 3/29 >>  ETT 3/29 >> 4/2, 4/2 (self extubated and re-intubated) >>    CULTURES: MRSA PCR 3/29 >> POS Urine 3/29 >> NG Blood 3/28 >> NG Blood 3/29 >> NG Tracheal 3/31 >> Gram stain neg,  NG C diff 4/3 >> Neg   ANTIBIOTICS: Vanc 3/29 >> 4/2 Pip-tazo 3/29 >>    SUBJECTIVE:  RASS -3 to +3.  Pt not able to follow commands.    VITAL SIGNS: Temp:   [97.3 F (36.3 C)-98 F (36.7 C)] 97.5 F (36.4 C) (04/05 0414) Pulse Rate:  [77-112] 99 (04/05 0700) Resp:  [12-25] 18 (04/05 0700) BP: (56-110)/(24-76) 92/63 mmHg (04/05 0700) SpO2:  [73 %-100 %] 100 % (04/05 0700) FiO2 (%):  [40 %-50 %] 40 % (04/05 0414) HEMODYNAMICS: CVP:  [10 mmHg] 10 mmHg VENTILATOR SETTINGS: Vent Mode:  [-] PRVC FiO2 (%):  [40 %-50 %] 40 % Set Rate:  [16 bmp] 16 bmp Vt Set:  [600 mL] 600 mL PEEP:  [5 cmH20] 5 cmH20 Plateau Pressure:  [10 cmH20-29 cmH20] 24 cmH20 INTAKE / OUTPUT: Intake/Output     04/04 0701 - 04/05 0700 04/05 0701 - 04/06 0700   I.V. (mL/kg) 2202.6 (34.7)    NG/GT 1455    IV Piggyback 700    Total Intake(mL/kg) 4357.6 (68.6)    Urine (mL/kg/hr) 110 (0.1)    Emesis/NG output     Stool 1400 (0.9)    Total Output 1510     Net +2847.6            PHYSICAL EXAMINATION: General: Looks critically ill HEENT: WNL Cardiac: RRR  Lungs: No wheezes noted Abdomen: Soft, diminished BS Ext: SCD's, cool Neuro: No focal neurologic deficits. RASS -3 to +3  PULMONARY  Recent Labs Lab 01/09/14 0916 01/09/14 1413 01/09/14 1455  PHART 7.058* 7.358  --   PCO2ART 40.1 29.0*  --   PO2ART 60.0* 386.0*  --   HCO3 11.3* 17.0*  --   TCO2 13 18  --   O2SAT 78.0 100.0 59.1    CBC  Recent Labs Lab  01/12/14 0500 01/13/14 0510 01/14/14 0658  HGB 9.2* 8.2* 8.1*  HCT 26.6* 23.6* 24.5*  WBC 24.0* 21.4* 33.1*  PLT 133*  132* 142* 140*    COAGULATION  Recent Labs Lab 01/12/14 0500  INR 2.06*     CHEMISTRY  Recent Labs Lab 01/11/14 1306 01/12/14 0106 01/12/14 0500 01/13/14 0510 01/13/14 1000 01/13/14 1300 01/14/14 0500 01/14/14 1000 01/15/14 0500  NA  --   --  141 145 143  --  137  --  140  K  --   --  3.6* 2.8* 3.2*  --  3.5*  --  4.0  CL  --   --  100 103 102  --  99  --  99  CO2  --   --  21 23 22   --  20  --  17*  GLUCOSE  --   --  120* 121* 120*  --  127*  --  109*  BUN  --   --  37* 45* 47*  --  51*  --  60*   CREATININE  --   --  3.56* 4.29* 4.24*  --  4.57*  --  4.81*  CALCIUM  --   --  6.0* 5.7* 5.8*  --  5.9*  --  6.6*  MG 2.1 2.1  --   --   --  1.7  --  2.2 2.2  PHOS 6.1* 5.6*  --   --   --   --  3.7  --  4.3   Estimated Creatinine Clearance: 17.8 ml/min (by C-G formula based on Cr of 4.81).   LIVER  Recent Labs Lab 01/11/14 0400 01/12/14 0500 01/14/14 0500 01/15/14 0500  AST 3622* 801* 102* 76*  ALT 2385* 1367* 528* 392*  ALKPHOS 118* 109 96 124*  BILITOT 0.6 0.4 1.0 0.6  PROT 4.6* 4.4* 4.9* 4.9*  ALBUMIN 1.9* 1.7* 1.5* 1.3*  INR  --  2.06*  --   --      INFECTIOUS  Recent Labs Lab 01/09/14 1123 01/15/14 1433 01/16/14 0500  LATICACIDVEN 4.7*  --   --   PROCALCITON  --  2.43 3.50     ENDOCRINE CBG (last 3)   Recent Labs  01/15/14 1957 01/16/14 0006 01/16/14 0414  GLUCAP 100* 86 94         IMAGING x48h  Dg Chest Port 1 View  01/15/2014   CLINICAL DATA:  Respiratory failure.  EXAM: PORTABLE CHEST - 1 VIEW  COMPARISON:  01/14/2014, 01/13/2014, 01/12/2014, 01/11/2014, and 01/09/2014  FINDINGS: Endotracheal tube, NG tube, and central catheter appear in good position. There is recurrent pulmonary edema in the left perihilar region. Moderate right pleural effusion has slightly increased. Pulmonary edema at the right base is unchanged. Slight atelectasis at the left base has increased.  IMPRESSION: Recurrent pulmonary edema with slight increase in the right effusion.   Electronically Signed   By: Rozetta Nunnery M.D.   On: 01/15/2014 08:15     ASSESSMENT / PLAN:  PULMONARY A: Acute resp failure due to HCAP requiring ventilator support    - does not meet SBT criteria  COPD - emphysema P:   Cont full vent support SBT once meets criteria  Albuterol neb and duoneb PRN Ethics committee consult for health care decision making (Dr. Andria Frames [phone])  CARDIOVASCULAR A:  Septic shock   -Continued hypotension despite re-initiating pressor support  - confusing  volume status. cxr suggess CHF (staff md comment)  P: (staff MD comment) -  Phenylephrine started 4/4 to maintain MAP > 65 mmHg -dc free water - get echo  - get scvo2; if very low suggess chf   RENAL A:   Acute kidney failure, oligo-anuric -  likely due to ATN (severe sepsis)     -worsening with no recovery    P:   Renal consult (staff md cooment);  Likely not a good HD or CRRT candidate given comorbidities Monitor I/Os Correct electrolytes as indicated Avoid nephrotoxins    GASTROINTESTINAL A:   H/O GERD, chronic PPI use Transaminitis secondary to severe sepsis - improving  P:   IV famotidine Tube feedings osmolite and pro-stat initiated 3/31 Monitor CMP intermittently    HEMATOLOGIC A:   Normocytic Anemia, likely due to severe sepsis  Mild thrombocytopenia, stable- likely due to severe sepsis P:  SQ heparin DVT ppx Monitor CBC intermittently Transfuse per usual ICU guidelines  INFECTIOUS A:   Severe sepsis  (AKI, transaminitis, and encephalopathy)   -Pro calcitonin 3.5    RLL HCAP, NOS MRSA Colonization P:   Cont IV zosyn  Follow  PCT CBC intermittently   ENDOCRINE A:   Hypothyroidism Hypoglycemia    - 65 this AM Hyperglycemia - resolved P:   Cont L-thyroxine  Start SSI if CBG >180 CBG Q 6 hr D50 as needed for CBG <70  NEUROLOGIC/PSYCHOSOCIAL A:   Acute encephalopathy with severe agitation Schizoaffective disorder Seizure disorder  No HCPOA identified P:   Cont precedex and fentanyl infusions Cont Remeron, Zyprexa, Cogentin  Cont Haldol and versed PRN IV phenytoin per pharmacy Ethic consult requested  Juluis Mire PGY1- IMTS  01/16/2014 7:27 AM   STAFF NOTE: I, Dr Ann Lions have personally reviewed patient's available data, including medical history, events of note, physical examination and test results as part of my evaluation. I have discussed with resident/NP and other care providers such as pharmacist, RN and RRT.  In  addition,  I personally evaluated patient and elicited key findings of persistent shock, confusing volume status, worsening renal failure. Will get echo and renal consult. Renal to opine on renal failure (etiology, prognosis, and reversibility and if good candidate for CRRT/HD). Still awaiting ethics consult.  Rest per NP/medical resident whose note is outlined above and that I agree with  The patient is critically ill with multiple organ systems failure and requires high complexity decision making for assessment and support, frequent evaluation and titration of therapies, application of advanced monitoring technologies and extensive interpretation of multiple databases.   Critical Care Time devoted to patient care services described in this note is  35  Minutes.  Dr. Brand Males, M.D., [facility].C.P Pulmonary and Critical Care Medicine Staff Physician [place] [facility] Pager: [phone], If no answer or between  15:00h - 7:00h: call [phone]  01/16/2014 10:37 AM   ADDENDUM: After rounds pt was noted by nursing staff to have unequal pupils with hypotension 56/45 requiring increase rate of neo-synephrine. STAT ABG was drawn which revealed severe metabolic acidosis with pH 6.858, bicarbonate 7.7, and  AG 34. Pt was given 1 amp bicarbonate and started on bicarbonate drip. Renal re-consulted and plans on starting emergent CRRT for correction of acidosis. Lactic acid, CK, lipase, and amylase ordered. STAT CT head w/o cont ordered to evaluate for brain herniation, however currently unstable for exam.   Juluis Mire PGY-1 IMTS

## 2014-01-16 NOTE — Consult Note (Signed)
Requesting Physician:  Dr. Chase Caller Reason for Consult:  Acute kidney injury HPI: The patient is a 43 y.o. year-old [place] nursing home resident with a background of schizoaffective disorder, hearing loss, COPD, malnutrition, history of seizures, history of lower occipital craniotomy with encephalomalacia (old CT reading), history of  wheelchair bound status but conversational according to the SNF.   Brought to the ED on 3/29 with leg pain and dyspnea and according to the notes history was very vague.  He was hypotensive on admission (80's) and tachycardic. Appears exam was otherwise unremarkable.  O2 sats were 100%. CXR initial read as clear. Labs were notable for WBC of 18,000, pH of 7, lactic acid of 4.47. Steroids, pressors, empiric antibiotics and fluid rescuscitation initiated. Renal function on presentation was at his baseline creatinine of 0.9. Subsequently required intubation for airway protection. CXR evolution of PNA. Creatinine has risen in this setting to 4.81 today and patient has been relative oliguric since admission.  Vanco level was noted to be 42.2 on 4/2 and vanco was discontinued. Shock liver.  Requiring pressor support.  Severe metabolic acidosis on admission, but this improved with IV sodium bicarbonate earlier in the admission. Was off pressor support for a few days, had to be restarted 4/4.  There are no family to assist with decision making and the primary service plans to involve the Ethics Committee for decisions re: how far to escalate care in the absence of family.  We are asked to see regarding his renal failure.  There has been no contrast exposure, no ACE or ARB. Was on meds for HTN PTA. No NSAIDS listed on admit med list.   Creatinine, Ser  Date/Time Value Ref Range Status  01/15/2014  5:00 AM 4.81* 0.50 - 1.35 mg/dL Final  01/14/2014  5:00 AM 4.57* 0.50 - 1.35 mg/dL Final  01/13/2014 10:00 AM 4.24* 0.50 - 1.35 mg/dL Final  01/13/2014  5:10 AM 4.29* 0.50 - 1.35 mg/dL Final   01/12/2014  5:00 AM 3.56* 0.50 - 1.35 mg/dL Final  01/11/2014  4:00 AM 2.88* 0.50 - 1.35 mg/dL Final  01/10/2014  3:00 PM 2.36* 0.50 - 1.35 mg/dL Final     DELTA CHECK NOTED  01/09/2014 11:23 AM 1.16  0.50 - 1.35 mg/dL Final  01/09/2014  8:17 AM 1.17  0.50 - 1.35 mg/dL Final  01/09/2014  3:58 AM 0.90  0.50 - 1.35 mg/dL Final  01/09/2014 12:05 AM 1.17  0.50 - 1.35 mg/dL Final  09/13/2013  5:27 AM 1.22  0.50 - 1.35 mg/dL Final  09/12/2013  4:40 PM 0.86  0.50 - 1.35 mg/dL Final  07/21/2013  5:56 AM 1.19  0.50 - 1.35 mg/dL Final  07/20/2013  4:08 AM 0.91  0.50 - 1.35 mg/dL Final  07/19/2013  4:25 AM 0.87  0.50 - 1.35 mg/dL Final  07/18/2013  2:31 AM 0.96  0.50 - 1.35 mg/dL Final  07/17/2013  8:25 PM 1.04  0.50 - 1.35 mg/dL Final  03/11/2013  4:39 AM 0.76  0.50 - 1.35 mg/dL Final  03/10/2013  4:19 AM 0.79  0.50 - 1.35 mg/dL Final  03/09/2013 12:00 PM 0.92  0.50 - 1.35 mg/dL Final  03/01/2013  3:27 PM 1.01  0.50 - 1.35 mg/dL Final  02/11/2012  5:32 AM 1.15  0.50 - 1.35 mg/dL Final  05/24/2011 10:25 PM 0.99  0.50 - 1.35 mg/dL Final    Past Medical History  Diagnosis Date  . Chronic pain   . Anxiety   . Schizoaffective disorder   .  Hearing loss   . Seizure   . Arthritis   . Asthma   . Foot drop   . Anemia   . Dehydration   . Hypotension   . GERD (gastroesophageal reflux disease)   . Hypothyroidism   . Constipation   . Polysubstance abuse   . Cancer   . Leukocytosis     Past Surgical History: History reviewed. No pertinent past surgical history.  Family History: History reviewed. No pertinent family history. Social History:  reports that he has been smoking Cigarettes.  He has a 40.5 pack-year smoking history. He has never used smokeless tobacco. He reports that he does not drink alcohol or use illicit drugs.  Allergies:  Allergies  Allergen Reactions  . Fexofenadine Hcl Other (See Comments)    unknown  . Percocet [Oxycodone-Acetaminophen] Other (See Comments)    unknown  . Thorazine  [Chlorpromazine Hcl] Other (See Comments)    unknown    Home medications: Prior to Admission medications   Medication Sig Start Date End Date Taking? Authorizing Provider  albuterol (PROVENTIL HFA;VENTOLIN HFA) 108 (90 BASE) MCG/ACT inhaler Inhale 2 puffs into the lungs every 4 (four) hours as needed for wheezing or shortness of breath.   Yes Historical Provider, MD  ALPRAZolam Duanne Moron) 0.5 MG tablet Take 1 tablet (0.5 mg total) by mouth daily at 12 noon. 07/21/13  Yes Janece Canterbury, MD  benztropine (COGENTIN) 0.5 MG tablet Take 0.5-1 mg by mouth See admin instructions. Take 2 tablets in the morning and 1 tablet at bedtime   Yes Historical Provider, MD  doxepin (SINEQUAN) 50 MG capsule Take 50 mg by mouth at bedtime.   Yes Historical Provider, MD  ferrous sulfate 325 (65 FE) MG tablet Take 325 mg by mouth daily with breakfast.   Yes Historical Provider, MD  FLUoxetine (PROZAC) 10 MG capsule Take 10 mg by mouth daily.   Yes Historical Provider, MD  FLUoxetine (PROZAC) 20 MG capsule Take 20 mg by mouth daily. With 10 mg to = 30 mg   Yes Historical Provider, MD  Fluticasone-Salmeterol (ADVAIR) 100-50 MCG/DOSE AEPB Inhale 1 puff into the lungs 2 (two) times daily.   Yes Historical Provider, MD  guaifenesin (ROBITUSSIN) 100 MG/5ML syrup Take 200 mg by mouth every 4 (four) hours as needed for cough.   Yes Historical Provider, MD  levothyroxine (SYNTHROID, LEVOTHROID) 75 MCG tablet Take 75 mcg by mouth daily.   Yes Historical Provider, MD  loratadine (CLARITIN) 10 MG tablet Take 10 mg by mouth daily.   Yes Historical Provider, MD  megestrol (MEGACE) 400 MG/10ML suspension Take 400 mg by mouth 2 (two) times daily.   Yes Historical Provider, MD  metoprolol tartrate (LOPRESSOR) 25 MG tablet Take 25 mg by mouth 2 (two) times daily.   Yes Historical Provider, MD  mirtazapine (REMERON) 45 MG tablet Take 22.5 mg by mouth at bedtime.   Yes Historical Provider, MD  Nutritional Supplements (ENSURE PO) Take 1 Can  by mouth 3 (three) times daily with meals.   Yes Historical Provider, MD  OLANZapine (ZYPREXA) 15 MG tablet Take 15 mg by mouth at bedtime.   Yes Historical Provider, MD  omeprazole (PRILOSEC) 40 MG capsule Take 40 mg by mouth daily.    Yes Historical Provider, MD  phenytoin (DILANTIN) 100 MG ER capsule Take 300 mg by mouth at bedtime.   Yes Historical Provider, MD  polyethylene glycol powder (GLYCOLAX/MIRALAX) powder Take 17 g by mouth daily.   Yes Historical Provider, MD  simvastatin (  ZOCOR) 10 MG tablet Take 10 mg by mouth at bedtime.   Yes Historical Provider, MD  solifenacin (VESICARE) 10 MG tablet Take 10 mg by mouth daily.   Yes Historical Provider, MD  sucralfate (CARAFATE) 1 G tablet Take 1 g by mouth 4 (four) times daily.   Yes Historical Provider, MD  traMADol (ULTRAM) 50 MG tablet Take 50 mg by mouth every 6 (six) hours as needed for moderate pain.   Yes Historical Provider, MD  zolpidem (AMBIEN) 5 MG tablet Take 1 tablet (5 mg total) by mouth at bedtime. 07/21/13  Yes Janece Canterbury, MD  acetaminophen (TYLENOL) 325 MG tablet Take 650 mg by mouth every 6 (six) hours as needed. Pain    Historical Provider, MD  ondansetron (ZOFRAN ODT) 4 MG disintegrating tablet Take 1 tablet (4 mg total) by mouth every 8 (eight) hours as needed for nausea. 03/13/13   Velvet Bathe, MD    Inpatient medications: . antiseptic oral rinse  15 mL Mouth Rinse QID  . benztropine  0.5 mg Oral QHS  . benztropine  1 mg Oral Daily  . chlorhexidine  15 mL Mouth Rinse BID  . dextrose      . feeding supplement (PRO-STAT SUGAR FREE 64)  30 mL Per Tube Q1500  . heparin  5,000 Units Subcutaneous 3 times per day  . ipratropium-albuterol  3 mL Nebulization Q6H  . levothyroxine  75 mcg Per Tube QAC breakfast  . mirtazapine  30 mg Per Tube QHS  . OLANZapine  15 mg Per Tube QHS  . pantoprazole sodium  40 mg Per Tube Q1200  . piperacillin-tazobactam (ZOSYN)  IV  2.25 g Intravenous 4 times per day   . dexmedetomidine  1.2 mcg/kg/hr (01/16/14 0518)  . feeding supplement (OSMOLITE 1.2 CAL) 1,000 mL (01/15/14 0928)  . fentaNYL infusion INTRAVENOUS 125 mcg/hr (01/16/14 1401)  . phenylephrine (NEO-SYNEPHRINE) Adult infusion 60 mcg/min (01/16/14 1000)   Review of Systems Unobtainable - pt intubated and sedated on the vent  Physical Exam:  Blood pressure 91/59, pulse 94, temperature 97.6 F (36.4 C), temperature source Oral, resp. rate 19, height 6\' 2"  (1.88 m), weight 63.5 kg (139 lb 15.9 oz), SpO2 99.00%.  Gen: thin almost cachectic appearing WM  Unresponsive on the vent Skin: pale, pasty; no rash, cyanosis Neck: no JVD, no bruits or LAN Chest: Crackles with exp wheezes Heart: Tachy regular Heart sounds normal S1S2 no S3 No def murmur Abdomen: Mod distended; palpation of RUQ elicits pain response Liver edge below RMC Bruising abd wall No masses felt Marked scrotal and penile edema Ext: Contractures at the ankles 1-2+ edema LE's  Hands in mittens Neuro: Sedated (becomes agitated with lightening of sedation and tries to pull out lines and tubes) Grimaces with pain when abd palpated Rectal tube diarrheal stool Minimal urine in foley  Labs: Basic Metabolic Panel:  Recent Labs Lab 01/10/14 1500 01/11/14 0400 01/11/14 1306 01/12/14 0106 01/12/14 0500 01/13/14 0510 01/13/14 1000 01/14/14 0500 01/15/14 0500  NA 144 142  --   --  141 145 143 137 140  K 3.2* 3.8  --   --  3.6* 2.8* 3.2* 3.5* 4.0  CL 101 99  --   --  100 103 102 99 99  CO2 26 23  --   --  21 23 22 20  17*  GLUCOSE 77 122*  --   --  120* 121* 120* 127* 109*  BUN 22 26*  --   --  37* 45* 47*  51* 60*  CREATININE 2.36* 2.88*  --   --  3.56* 4.29* 4.24* 4.57* 4.81*  CALCIUM 5.7* 6.2*  --   --  6.0* 5.7* 5.8* 5.9* 6.6*  PHOS  --   --  6.1* 5.6*  --   --   --  3.7 4.3   Liver Function Tests:  Recent Labs Lab 01/12/14 0500 01/14/14 0500 01/15/14 0500  AST 801* 102* 76*  ALT 1367* 528* 392*  ALKPHOS 109 96 124*  BILITOT 0.4 1.0 0.6   PROT 4.4* 4.9* 4.9*  ALBUMIN 1.7* 1.5* 1.3*   No results found for this basename: LIPASE, AMYLASE,  in the last 168 hours No results found for this basename: AMMONIA,  in the last 168 hours CBC:  Recent Labs Lab 01/11/14 0400 01/12/14 0500 01/13/14 0510 01/14/14 0658  WBC 31.9* 24.0* 21.4* 33.1*  HGB 10.3* 9.2* 8.2* 8.1*  HCT 29.7* 26.6* 23.6* 24.5*  MCV 86.8 86.6 88.1 89.1  PLT 108* 133*  132* 142* 140*    Recent Labs Lab 01/14/14 0500  CKTOTAL 336*    Recent Labs Lab 01/15/14 1518 01/15/14 1957 01/16/14 0006 01/16/14 0414 01/16/14 0735  GLUCAP 72 100* 86 94 65*  Results for JAKWAN, SALLY (MRN [id number]) as of 01/16/2014 14:09  Ref. Range 01/09/2014 02:42 01/13/2014 17:13  Color, Urine Latest Range: YELLOW  AMBER (A) YELLOW  APPearance Latest Range: CLEAR  CLOUDY (A) CLEAR  Specific Gravity, Urine Latest Range: 1.005-1.030  1.019 1.014  pH Latest Range: 5.0-8.0  6.0 6.0  Glucose Latest Range: NEGATIVE mg/dL NEGATIVE NEGATIVE  Bilirubin Urine Latest Range: NEGATIVE  SMALL (A) NEGATIVE  Ketones, ur Latest Range: NEGATIVE mg/dL NEGATIVE NEGATIVE  Protein Latest Range: NEGATIVE mg/dL 30 (A) NEGATIVE  Urobilinogen, UA Latest Range: 0.0-1.0 mg/dL 1.0 0.2  Nitrite Latest Range: NEGATIVE  NEGATIVE NEGATIVE  Leukocytes, UA Latest Range: NEGATIVE  NEGATIVE NEGATIVE  Hgb urine dipstick Latest Range: NEGATIVE  MODERATE (A) MODERATE (A)  Urine-Other No range found MUCOUS PRESENT   WBC, UA Latest Range: <3 WBC/hpf 0-2   RBC / HPF Latest Range: <3 RBC/hpf 11-20 3-6  Squamous Epithelial / LPF Latest Range: RARE  RARE   Bacteria, UA Latest Range: RARE  RARE RARE  .abg  Iron Studies: No results found for this basename: IRON, TIBC, TRANSFERRIN, FERRITIN,  in the last 168 hours  Xrays/Other Studies: Dg Chest Port 1 View  01/16/2014   CLINICAL DATA:  Intubation, respiratory failure  EXAM: PORTABLE CHEST - 1 VIEW  COMPARISON:  01/15/2014  FINDINGS: Endotracheal tube is above the  carina. Overall stable support apparatus. Background COPD/ emphysema noted. Similar pattern of diffuse airspace disease versus edema with pleural effusions, larger on the right. Mild cardiac enlargement. Basilar atelectasis also noted. Findings compatible with superimposed CHF, less likely pneumonia. No pneumothorax.  IMPRESSION: Background COPD/ emphysema with superimposed CHF.   Electronically Signed   By: Daryll Brod M.D.   On: 01/16/2014 08:59   Dg Chest Port 1 View  01/15/2014   CLINICAL DATA:  Respiratory failure.  EXAM: PORTABLE CHEST - 1 VIEW  COMPARISON:  01/14/2014, 01/13/2014, 01/12/2014, 01/11/2014, and 01/09/2014  FINDINGS: Endotracheal tube, NG tube, and central catheter appear in good position. There is recurrent pulmonary edema in the left perihilar region. Moderate right pleural effusion has slightly increased. Pulmonary edema at the right base is unchanged. Slight atelectasis at the left base has increased.  IMPRESSION: Recurrent pulmonary edema with slight increase in the right effusion.   Electronically Signed  By: Rozetta Nunnery M.D.   On: 01/15/2014 08:15   Impression/Plan 43 yo SNF resident with schizoaffective disorder, COPD, hearing loss, ? prior craniotomy with encephalomalacia, wheelchair bound - presented to the ED with shock, presumed d/t HCAP.  Has developed oliguric AKI in the setting of shock, pressor dep hypotension, and incidentally had elevated vanco levels  1. AKI - due to shock.  No nephrotoxins (vanco toxic levels as a RESULT of AKI, not likely proximate contributor). Urine fairly bland. Ultrasound pending.  No ABSOLUTE indications for renal replacement therapy as of yet but may develop absolute need and he would be a candidate for short term RRT since his renal failure is acute (he would NOT be a very good candidate for any sort of long term dialysis).  In the meantime, Ethics Committee to get involved to help with decisions as to how far to escalate care given pts lack  of family, guardian or POA to participate in the decision making. 2. Septic shock (presumed) - pressors; ATB's; has had extensive vol rescusc; echo ordered,   3. Shock liver - improving 4. HCAP - zosyn; "off" vanco (with elevated levels likely quite a bit on board) 5. Sz disorder - Meds 6. Hypothyroidism on replacement IV 7. Schizoaffective disorder  8. Wheelchair bound status  9. Lack of health care POA/family/guardian - for Ethics Committee consult  Jamal Maes,  MD [place] [phone] pager 01/16/2014, 1:58 PM

## 2014-01-16 NOTE — Procedures (Signed)
Arterial Catheter Insertion Procedure Note [name] [name] [id number] [birth year]  Procedure: Insertion of Arterial Catheter  Indications: Blood pressure monitoring and Frequent blood sampling  Procedure Details Consent: Unable to obtain consent because of altered level of consciousness. Time Out: Verified patient identification, verified procedure, site/side was marked, verified correct patient position, special equipment/implants available, medications/allergies/relevent history reviewed, required imaging and test results available.  Performed  Maximum sterile technique was used including antiseptics, cap, gloves, gown, hand hygiene, mask and sheet. Skin prep: Chlorhexidine; local anesthetic administered 20 gauge catheter was inserted into left radial artery using the Seldinger technique.  Evaluation Blood flow good; BP tracing good. Complications: No apparent complications.   [name] 01/16/2014

## 2014-01-16 NOTE — Progress Notes (Signed)
eLink Physician-Brief Progress Note Patient Name: [name] [name] DOB: 08-01-1971 MRN: [id number]  Date of Service  01/16/2014   HPI/Events of Note   Patient with severe metabolic acidosis and elevated AG.  Acute renal failure with no UOP.  Patient in shock requiring vasopressors.  eICU Interventions  Bicarb drip and 1 amp bicarb Nephrology contacted to start CRRT Lactic acid level Consider abd catastrophe given dramatic change in metabolic status over the course of the day   Intervention Category Major Interventions: Acid-Base disturbance - evaluation and management  Mauri Brooklyn, P 01/16/2014, 4:41 PM

## 2014-01-16 NOTE — Progress Notes (Signed)
ABG resulted on 01/16/14 at 2019 on vent settings PRVC VT 600, R 16, PEEP +5, O2 40%.  Vent settings changed to PRVC VT 600, R 30, PEEP +5, O2 100%. ABG will be repeated in 30 minutes.

## 2014-01-16 NOTE — Procedures (Signed)
Hemodialysis Insertion Procedure Note Orbin Mayeux [id number] September 03, 1971  Procedure: Insertion of Hemodialysis Catheter Type: 3 port  Indications: Hemodialysis   Procedure Details Consent: Risks of procedure as well as the alternatives and risks of each were explained to the (patient/caregiver).  Consent for procedure obtained. Time Out: Verified patient identification, verified procedure, site/side was marked, verified correct patient position, special equipment/implants available, medications/allergies/relevent history reviewed, required imaging and test results available.  Performed  Maximum sterile technique was used including antiseptics, cap, gloves, gown, hand hygiene, mask and sheet. Skin prep: Chlorhexidine; local anesthetic administered A antimicrobial bonded/coated triple lumen catheter was placed in the right internal jugular vein using the Seldinger technique. Ultrasound guidance used.yes Catheter placed to 16 cm. Blood aspirated via all 3 ports and then flushed x 3. Line sutured x 2 and dressing applied.  Evaluation Blood flow good Complications: No apparent complications Patient did tolerate procedure well. Chest X-ray ordered to verify placement.  CXR: pending.  Richardson Landry Minor ACNP Maryanna Shape PCCM Pager [phone] till 3 pm If no answer page [phone] 01/16/2014, 5:17 PM

## 2014-01-16 NOTE — Progress Notes (Signed)
CRITICAL VALUE ALERT  Critical value received:  Ph=6.86  Date of notification:  01/16/14  Time of notification:  2019  Critical value read back:yes  Nurse who received alert: Delphia Grates RN   MD notified (1st page):  Dr Tamala Julian and Dr Lowry Ram  Time of first page:  2020  MD notified (2nd page):  Time of second page:  Responding MD:  Dr Tamala Julian and Dr Lowry Ram  Time MD responded:  2020

## 2014-01-16 NOTE — Progress Notes (Signed)
CRITICAL VALUE ALERT  Critical value received:  *ph = 6.99  Date of notification:  01/16/14  Time of notification:  2135  Critical value read back:yes  Nurse who received alert:  Delphia Grates RN   MD notified (1st page):  DR Lowry Ram   Time of first page:  2140  MD notified (2nd page):  Time of second page:  Responding MD:  Dr Lowry Ram   Time MD responded:  2140

## 2014-01-16 NOTE — Progress Notes (Addendum)
MEDICATION RELATED CONSULT NOTE - FOLLOW UP   Pharmacy Consult for Phenytoin Indication: hx seizures  Allergies  Allergen Reactions  . Fexofenadine Hcl Other (See Comments)    unknown  . Percocet [Oxycodone-Acetaminophen] Other (See Comments)    unknown  . Thorazine [Chlorpromazine Hcl] Other (See Comments)    unknown    Patient Measurements: Height: 6\' 2"  (188 cm) Weight: 139 lb 15.9 oz (63.5 kg) IBW/kg (Calculated) : 82.2 Vital Signs: Temp: 97.6 F (36.4 C) (04/05 0759) Temp src: Oral (04/05 0759) BP: 91/59 mmHg (04/05 1000) Pulse Rate: 94 (04/05 1000) Intake/Output from previous day: 04/04 0701 - 04/05 0700 In: 4407.6 [I.V.:2252.6; NG/GT:1455; IV Piggyback:700] Out: 1510 [Urine:110; Stool:1400] Intake/Output from this shift: Total I/O In: 358.6 [I.V.:193.6; NG/GT:165] Out: 650 [Stool:650]  Labs:  Recent Labs  01/13/14 1713 01/14/14 0500 01/14/14 0658 01/14/14 1000 01/15/14 0500  WBC  --   --  33.1*  --   --   HGB  --   --  8.1*  --   --   HCT  --   --  24.5*  --   --   PLT  --   --  140*  --   --   CREATININE  --  4.57*  --   --  4.81*  LABCREA 33.76  --   --   --   --   MG  --   --   --  2.2 2.2  PHOS  --  3.7  --   --  4.3  ALBUMIN  --  1.5*  --   --  1.3*  PROT  --  4.9*  --   --  4.9*  AST  --  102*  --   --  76*  ALT  --  528*  --   --  392*  ALKPHOS  --  96  --   --  124*  BILITOT  --  1.0  --   --  0.6   Estimated Creatinine Clearance: 17.8 ml/min (by C-G formula based on Cr of 4.81).   Microbiology: Recent Results (from the past 720 hour(s))  CULTURE, BLOOD (ROUTINE X 2)     Status: None   Collection Time    January 20, 2014 11:58 PM      Result Value Ref Range Status   Specimen Description BLOOD WRIST RIGHT   Final   Special Requests BOTTLES DRAWN AEROBIC AND ANAEROBIC 5CC EA   Final   Culture  Setup Time     Final   Value: 01/09/2014 03:47     Performed at [facility]   Culture     Final   Value: NO GROWTH 5 DAYS     Performed at  [facility]   Report Status 01/15/2014 FINAL   Final  CULTURE, BLOOD (ROUTINE X 2)     Status: None   Collection Time    01/09/14 12:24 AM      Result Value Ref Range Status   Specimen Description BLOOD LEFT ARM   Final   Special Requests BOTTLES DRAWN AEROBIC ONLY 10CC   Final   Culture  Setup Time     Final   Value: 01/09/2014 03:47     Performed at [facility]   Culture     Final   Value: NO GROWTH 5 DAYS     Performed at [facility]   Report Status 01/15/2014 FINAL   Final  URINE CULTURE     Status:  None   Collection Time    01/09/14  2:42 AM      Result Value Ref Range Status   Specimen Description URINE, CATHETERIZED   Final   Special Requests ADD [id number] [phone]   Final   Culture  Setup Time     Final   Value: 01/09/2014 14:10     Performed at [place]     Final   Value: NO GROWTH     Performed at [facility]   Culture     Final   Value: NO GROWTH     Performed at [facility]   Report Status 01/10/2014 FINAL   Final  MRSA PCR SCREENING     Status: Abnormal   Collection Time    01/09/14  4:50 AM      Result Value Ref Range Status   MRSA by PCR POSITIVE (*) NEGATIVE Final   Comment:            The GeneXpert MRSA Assay (FDA     approved for NASAL specimens     only), is one component of a     comprehensive MRSA colonization     surveillance program. It is not     intended to diagnose MRSA     infection nor to guide or     monitor treatment for     MRSA infections.     RESULT CALLED TO, READ BACK BY AND VERIFIED WITH:     MACK,B RN [phone] 01/09/14 MITCHELL,L  CULTURE, RESPIRATORY (NON-EXPECTORATED)     Status: None   Collection Time    01/11/14 11:53 AM      Result Value Ref Range Status   Specimen Description TRACHEAL ASPIRATE   Final   Special Requests NONE   Final   Gram Stain     Final   Value: NO WBC SEEN     NO SQUAMOUS EPITHELIAL CELLS SEEN     NO ORGANISMS SEEN     Performed at  [facility]   Culture     Final   Value: NO GROWTH 2 DAYS     Performed at [facility]   Report Status 01/13/2014 FINAL   Final  CLOSTRIDIUM DIFFICILE BY PCR     Status: None   Collection Time    01/14/14  2:17 PM      Result Value Ref Range Status   C difficile by pcr NEGATIVE  NEGATIVE Final    Assessment: 55 YOM on phenytoin PTA for hx seizures. No current seizure activity.   DPH 300 QHS PTA (~4.7mg /kg/d) >> 100mg  IV Q8H on 3/30 >> holding since 4/1.  3/29 level 7.9 >> corr 12.7 4/1 level 12.8 >> corr 28.87 (no reported nystagmus)  4/2 level 9.3 >> corr 21.1 4/3 level 8.5>> corr 21.2  4/5 level 5.3>> corr 15.5 with new alb 1.2 >> currently clearing 3mg /hr.   Goal of Therapy:  Phenytoin goal of 10-20.   Plan:  Discussed case with Dr. Chase Caller-- will hold off today on restart.  Re-evaluate on Monday with CCM and Ethics plan of care as well as renal function.  Possible restart phenytion on Monday at much reduced dose.   Sloan Leiter, PharmD, BCPS Clinical Pharmacist [phone] 01/16/2014,1:51 PM   Addendum: Vancomycin re-order per pharmacy consult.   Last vancomycin trough = 42.2 on 4/2.   Patient's SCr continues to climb. Renal is starting CRRT.   Plan: Check vancomycin random level.  Adjust Zosyn to 3.375g IV q6h - each over 30 minutes.   Sloan Leiter, PharmD, BCPS Clinical Pharmacist [phone] 01/16/2014, 7:10 PM

## 2014-01-16 NOTE — Progress Notes (Signed)
eLink Physician-Brief Progress Note Patient Name: [name] [name] DOB: 08-16-1971 MRN: [id number]  Date of Service  01/16/2014   HPI/Events of Note  Patient with WBC 61 and firm abd with significant diarrhea.  Severe acidemia and hypotension.   eICU Interventions  Vanv IV and oral flagyl added Repeat stool for cdiff CT of chest,abd, pelvis, and Hd Continue bicarbonate After above complete get CRRT started   Intervention Category Major Interventions: Sepsis - evaluation and management  Mauri Brooklyn, P 01/16/2014, 7:47 PM

## 2014-01-16 NOTE — Progress Notes (Addendum)
Renal Progress Note  New labs have returned since my initial consult earlier this afternoon that show development of a profound acidosis with a gap of 34. Patient's venous CO2 has dropped from 18 this AM to 8.  PH is 6.88; pt has progressive hypotension. See labs below: ABG    Component Value Date/Time   PHART 6.864* 01/16/2014 1601   PCO2ART 42.0 01/16/2014 1601   PO2ART 40.0* 01/16/2014 1601   HCO3 7.7* 01/16/2014 1601   TCO2 9 01/16/2014 1601   ACIDBASEDEF 25.0* 01/16/2014 1601   O2SAT 43.0 01/16/2014 1601    Recent Labs  01/14/14 0500  01/15/14 0500 01/16/14 1340  NA 137  --  140 137  K 3.5*  --  4.0 4.6  CL 99  --  99 95*  CO2 20  --  17* 8*  GLUCOSE 127*  --  109* 231*  BUN 51*  --  60* 58*  CREATININE 4.57*  --  4.81* 5.08*  CALCIUM 5.9*  --  6.6* 6.9*  MG  --   < > 2.2 2.5  PHOS 3.7  --  4.3  --   < > = values in this interval not displayed.  CRRT is now definitely indicated for management of acidosis with this new data Will not likely be able to pull any volume - first order of business will be correction of acidosis.  CCM will place line. Will enter CRRT orders Peripheral isotonic bicarb at 150 cc/hour  Jamal Maes, MD [place] Pager 01/16/2014, 4:49 PM

## 2014-01-16 NOTE — Procedures (Signed)
PErsonally  Supervised procedure  Real time 2D ultrasound used for vein site selection, patency assessment, and needle entry. A record of image was made but could not be submitted for filing due to malfunction of printing device   Dr. Brand Males, M.D., [facility]C.P Pulmonary and Critical Care Medicine Staff Physician [facility] Pager: [phone], If no answer or between  15:00h - 7:00h: call [phone]  01/16/2014 5:27 PM

## 2014-01-16 NOTE — Progress Notes (Signed)
Wbc 61.1 called to dr Tamala Julian. New orders rece3ived

## 2014-01-16 NOTE — Progress Notes (Signed)
Called to see patient for worsening abdominal distention. On exam pupils unequal. MAP ~ 65. Alert but minimally cooperative. Lab review notable for pH < 6.9 at 4 PM. Worsening renal failure. Hypocalcemia. WBC > 60. Elevated amylase and lipase. C. Dif negative 4/3.   A/P Worsening Shock, Likely Septic. Suspect C. Dif given diarrhea. AKI:    Aggressive Sedation with Versed.   Increase Vent Rate to 30  Repeat ABG; Serial ABGs  Empiric Steroids, Check Cortisol  Panculture  Pan-Scan (Head, Chest, Abd, Pelvis to evaluate for source)  Hook up to CVVHD when returns  Replete Calcium  Add pressors to maintain MAP >= 65  Add oral Vanc to IV vanc,   Start Micafungin  CRITICAL CARE: The patient is critically ill with multiple organ systems failure and requires high complexity decision making for assessment and support, frequent evaluation and titration of therapies, application of advanced monitoring technologies and extensive interpretation of multiple databases. Critical Care Time devoted to patient care services described in this note is 45 minutes. This is in addition to the 35 minute provided by Dr. Chase Caller earlier today.   Margarette Asal, MD

## 2014-01-17 ENCOUNTER — Inpatient Hospital Stay (HOSPITAL_COMMUNITY): Payer: Medicare Other

## 2014-01-17 DIAGNOSIS — R64 Cachexia: Secondary | ICD-10-CM

## 2014-01-17 DIAGNOSIS — D649 Anemia, unspecified: Secondary | ICD-10-CM

## 2014-01-17 DIAGNOSIS — I369 Nonrheumatic tricuspid valve disorder, unspecified: Secondary | ICD-10-CM

## 2014-01-17 LAB — CBC WITH DIFFERENTIAL/PLATELET
BAND NEUTROPHILS: 0 % (ref 0–10)
BLASTS: 0 %
Basophils Absolute: 0 10*3/uL (ref 0.0–0.1)
Basophils Relative: 0 % (ref 0–1)
EOS ABS: 0.5 10*3/uL (ref 0.0–0.7)
Eosinophils Relative: 1 % (ref 0–5)
HEMATOCRIT: 26.3 % — AB (ref 39.0–52.0)
Hemoglobin: 8.6 g/dL — ABNORMAL LOW (ref 13.0–17.0)
LYMPHS PCT: 7 % — AB (ref 12–46)
Lymphs Abs: 3.5 10*3/uL (ref 0.7–4.0)
MCH: 29.8 pg (ref 26.0–34.0)
MCHC: 32.7 g/dL (ref 30.0–36.0)
MCV: 91 fL (ref 78.0–100.0)
Metamyelocytes Relative: 0 %
Monocytes Absolute: 0.5 10*3/uL (ref 0.1–1.0)
Monocytes Relative: 1 % — ABNORMAL LOW (ref 3–12)
Myelocytes: 0 %
NEUTROS PCT: 91 % — AB (ref 43–77)
Neutro Abs: 46.4 10*3/uL — ABNORMAL HIGH (ref 1.7–7.7)
PLATELETS: 176 10*3/uL (ref 150–400)
Promyelocytes Absolute: 0 %
RBC: 2.89 MIL/uL — AB (ref 4.22–5.81)
RDW: 16.8 % — ABNORMAL HIGH (ref 11.5–15.5)
WBC MORPHOLOGY: INCREASED
WBC: 50.6 10*3/uL (ref 4.0–10.5)
nRBC: 0 /100 WBC

## 2014-01-17 LAB — GI PATHOGEN PANEL BY PCR, STOOL
C DIFFICILE TOXIN A/B: NEGATIVE
CAMPYLOBACTER BY PCR: NEGATIVE
CRYPTOSPORIDIUM BY PCR: NEGATIVE
E coli (ETEC) LT/ST: NEGATIVE
E coli (STEC): NEGATIVE
E coli 0157 by PCR: NEGATIVE
G LAMBLIA BY PCR: NEGATIVE
Norovirus GI/GII: NEGATIVE
ROTAVIRUS A BY PCR: NEGATIVE
SALMONELLA BY PCR: NEGATIVE
Shigella by PCR: NEGATIVE

## 2014-01-17 LAB — RENAL FUNCTION PANEL
Albumin: 1.4 g/dL — ABNORMAL LOW (ref 3.5–5.2)
Albumin: 1 g/dL — ABNORMAL LOW (ref 3.5–5.2)
BUN: 34 mg/dL — AB (ref 6–23)
BUN: 21 mg/dL (ref 6–23)
CALCIUM: 6.8 mg/dL — AB (ref 8.4–10.5)
CHLORIDE: 102 meq/L (ref 96–112)
CO2: 12 meq/L — AB (ref 19–32)
CO2: 14 meq/L — AB (ref 19–32)
CREATININE: 3.08 mg/dL — AB (ref 0.50–1.35)
CREATININE: 1.78 mg/dL — AB (ref 0.50–1.35)
Calcium: 5.4 mg/dL — CL (ref 8.4–10.5)
Chloride: 92 mEq/L — ABNORMAL LOW (ref 96–112)
GFR calc Af Amer: 27 mL/min — ABNORMAL LOW (ref >90)
GFR calc Af Amer: 52 mL/min — ABNORMAL LOW (ref >90)
GFR calc non Af Amer: 23 mL/min — ABNORMAL LOW (ref >90)
GFR calc non Af Amer: 45 mL/min — ABNORMAL LOW (ref >90)
GLUCOSE: 232 mg/dL — AB (ref 70–99)
GLUCOSE: 153 mg/dL — AB (ref 70–99)
PHOSPHORUS: 3.8 mg/dL (ref 2.3–4.6)
POTASSIUM: 3.4 meq/L — AB (ref 3.7–5.3)
Phosphorus: 2.3 mg/dL (ref 2.3–4.6)
Potassium: 3.6 mEq/L — ABNORMAL LOW (ref 3.7–5.3)
Sodium: 137 mEq/L (ref 137–147)
Sodium: 139 mEq/L (ref 137–147)

## 2014-01-17 LAB — POCT I-STAT 3, ART BLOOD GAS (G3+)
ACID-BASE DEFICIT: 9 mmol/L — AB (ref 0.0–2.0)
Acid-base deficit: 15 mmol/L — ABNORMAL HIGH (ref 0.0–2.0)
Acid-base deficit: 16 mmol/L — ABNORMAL HIGH (ref 0.0–2.0)
Acid-base deficit: 6 mmol/L — ABNORMAL HIGH (ref 0.0–2.0)
BICARBONATE: 10.8 meq/L — AB (ref 20.0–24.0)
BICARBONATE: 20 meq/L (ref 20.0–24.0)
Bicarbonate: 12.5 mEq/L — ABNORMAL LOW (ref 20.0–24.0)
Bicarbonate: 17.7 mEq/L — ABNORMAL LOW (ref 20.0–24.0)
O2 SAT: 92 %
O2 Saturation: 90 %
O2 Saturation: 92 %
O2 Saturation: 89 %
PCO2 ART: 32.2 mmHg — AB (ref 35.0–45.0)
PCO2 ART: 41.6 mmHg (ref 35.0–45.0)
PH ART: 7.211 — AB (ref 7.350–7.450)
PH ART: 7.287 — AB (ref 7.350–7.450)
PO2 ART: 70 mmHg — AB (ref 80.0–100.0)
PO2 ART: 74 mmHg — AB (ref 80.0–100.0)
PO2 ART: 61 mmHg — AB (ref 80.0–100.0)
Patient temperature: 94.7
Patient temperature: 97.5
Patient temperature: 97.7
Patient temperature: 97.4
TCO2: 14 mmol/L (ref 0–100)
TCO2: 19 mmol/L (ref 0–100)
TCO2: 12 mmol/L (ref 0–100)
TCO2: 21 mmol/L (ref 0–100)
pCO2 arterial: 38.4 mmHg (ref 35.0–45.0)
pCO2 arterial: 26.7 mmHg — ABNORMAL LOW (ref 35.0–45.0)
pH, Arterial: 7.185 — CL (ref 7.350–7.450)
pH, Arterial: 7.269 — ABNORMAL LOW (ref 7.350–7.450)
pO2, Arterial: 64 mmHg — ABNORMAL LOW (ref 80.0–100.0)

## 2014-01-17 LAB — BASIC METABOLIC PANEL
BUN: 27 mg/dL — ABNORMAL HIGH (ref 6–23)
CALCIUM: 6.9 mg/dL — AB (ref 8.4–10.5)
CO2: 16 mEq/L — ABNORMAL LOW (ref 19–32)
Chloride: 94 mEq/L — ABNORMAL LOW (ref 96–112)
Creatinine, Ser: 2.31 mg/dL — ABNORMAL HIGH (ref 0.50–1.35)
GFR, EST AFRICAN AMERICAN: 38 mL/min — AB (ref >90)
GFR, EST NON AFRICAN AMERICAN: 33 mL/min — AB (ref >90)
Glucose, Bld: 213 mg/dL — ABNORMAL HIGH (ref 70–99)
Potassium: 3.9 mEq/L (ref 3.7–5.3)
Sodium: 137 mEq/L (ref 137–147)

## 2014-01-17 LAB — HEPATIC FUNCTION PANEL
ALT: 669 U/L — ABNORMAL HIGH (ref 0–53)
AST: 693 U/L — ABNORMAL HIGH (ref 0–37)
Albumin: 1.3 g/dL — ABNORMAL LOW (ref 3.5–5.2)
Alkaline Phosphatase: 165 U/L — ABNORMAL HIGH (ref 39–117)
BILIRUBIN DIRECT: 1.3 mg/dL — AB (ref 0.0–0.3)
BILIRUBIN INDIRECT: 0.3 mg/dL (ref 0.3–0.9)
Total Bilirubin: 1.6 mg/dL — ABNORMAL HIGH (ref 0.3–1.2)
Total Protein: 5.1 g/dL — ABNORMAL LOW (ref 6.0–8.3)

## 2014-01-17 LAB — CBC
HCT: 26.5 % — ABNORMAL LOW (ref 39.0–52.0)
Hemoglobin: 8.9 g/dL — ABNORMAL LOW (ref 13.0–17.0)
MCH: 29.7 pg (ref 26.0–34.0)
MCHC: 33.6 g/dL (ref 30.0–36.0)
MCV: 88.3 fL (ref 78.0–100.0)
PLATELETS: 141 10*3/uL — AB (ref 150–400)
RBC: 3 MIL/uL — ABNORMAL LOW (ref 4.22–5.81)
RDW: 16.4 % — AB (ref 11.5–15.5)
WBC: 41.8 10*3/uL — AB (ref 4.0–10.5)

## 2014-01-17 LAB — AMYLASE: AMYLASE: 77 U/L (ref 0–105)

## 2014-01-17 LAB — LACTATE DEHYDROGENASE: LDH: 1120 U/L — AB (ref 94–250)

## 2014-01-17 LAB — URINALYSIS, ROUTINE W REFLEX MICROSCOPIC
BILIRUBIN URINE: NEGATIVE
Glucose, UA: 100 mg/dL — AB
Ketones, ur: NEGATIVE mg/dL
Nitrite: NEGATIVE
SPECIFIC GRAVITY, URINE: 1.021 (ref 1.005–1.030)
Urobilinogen, UA: 0.2 mg/dL (ref 0.0–1.0)
pH: 7 (ref 5.0–8.0)

## 2014-01-17 LAB — CLOSTRIDIUM DIFFICILE BY PCR: Toxigenic C. Difficile by PCR: NEGATIVE

## 2014-01-17 LAB — URINE CULTURE
COLONY COUNT: NO GROWTH
CULTURE: NO GROWTH

## 2014-01-17 LAB — APTT: aPTT: 129 seconds — ABNORMAL HIGH (ref 24–37)

## 2014-01-17 LAB — GLUCOSE, CAPILLARY
GLUCOSE-CAPILLARY: 167 mg/dL — AB (ref 70–99)
GLUCOSE-CAPILLARY: 203 mg/dL — AB (ref 70–99)
GLUCOSE-CAPILLARY: 218 mg/dL — AB (ref 70–99)
Glucose-Capillary: 206 mg/dL — ABNORMAL HIGH (ref 70–99)
Glucose-Capillary: 149 mg/dL — ABNORMAL HIGH (ref 70–99)

## 2014-01-17 LAB — TROPONIN I

## 2014-01-17 LAB — URINE MICROSCOPIC-ADD ON

## 2014-01-17 LAB — MAGNESIUM: MAGNESIUM: 2.2 mg/dL (ref 1.5–2.5)

## 2014-01-17 LAB — HEPATITIS PANEL, ACUTE
HCV Ab: NEGATIVE
HEP B S AG: NEGATIVE
Hep A IgM: NONREACTIVE
Hep B C IgM: NONREACTIVE

## 2014-01-17 LAB — CORTISOL: CORTISOL PLASMA: 124.8 ug/dL

## 2014-01-17 LAB — LACTIC ACID, PLASMA
LACTIC ACID, VENOUS: 11.5 mmol/L — AB (ref 0.5–2.2)
LACTIC ACID, VENOUS: 10.8 mmol/L — AB (ref 0.5–2.2)

## 2014-01-17 LAB — FIBRINOGEN: FIBRINOGEN: 520 mg/dL — AB (ref 204–475)

## 2014-01-17 LAB — VANCOMYCIN, RANDOM: VANCOMYCIN RM: 14.6 ug/mL

## 2014-01-17 LAB — LIPASE, BLOOD: LIPASE: 125 U/L — AB (ref 11–59)

## 2014-01-17 LAB — PROCALCITONIN: Procalcitonin: 2.66 ng/mL

## 2014-01-17 LAB — TSH: TSH: 0.712 u[IU]/mL (ref 0.350–4.500)

## 2014-01-17 MED ORDER — SODIUM CHLORIDE 0.9 % IV SOLN
2.0000 g | INTRAVENOUS | Status: DC
  Administered 2014-01-17: 2 g via INTRAVENOUS
  Filled 2014-01-17: qty 2

## 2014-01-17 MED ORDER — NOREPINEPHRINE BITARTRATE 1 MG/ML IJ SOLN
2.0000 ug/min | INTRAVENOUS | Status: DC
  Administered 2014-01-17: 20 ug/min via INTRAVENOUS
  Administered 2014-01-17: 13 ug/min via INTRAVENOUS
  Administered 2014-01-17 – 2014-01-18 (×2): 20 ug/min via INTRAVENOUS
  Administered 2014-01-18: 12 ug/min via INTRAVENOUS
  Filled 2014-01-17 (×5): qty 4

## 2014-01-17 MED ORDER — INSULIN ASPART 100 UNIT/ML ~~LOC~~ SOLN
0.0000 [IU] | SUBCUTANEOUS | Status: DC
  Administered 2014-01-17: 2 [IU] via SUBCUTANEOUS
  Administered 2014-01-17 – 2014-01-18 (×4): 1 [IU] via SUBCUTANEOUS
  Administered 2014-01-18: 2 [IU] via SUBCUTANEOUS
  Administered 2014-01-21: 3 [IU] via SUBCUTANEOUS
  Administered 2014-01-21 (×3): 1 [IU] via SUBCUTANEOUS
  Administered 2014-01-21: 3 [IU] via SUBCUTANEOUS
  Administered 2014-01-21 – 2014-01-22 (×3): 2 [IU] via SUBCUTANEOUS
  Administered 2014-01-22: 1 [IU] via SUBCUTANEOUS
  Administered 2014-01-22: 2 [IU] via SUBCUTANEOUS
  Administered 2014-01-22: 08:00:00 via SUBCUTANEOUS
  Administered 2014-01-22 – 2014-01-23 (×2): 1 [IU] via SUBCUTANEOUS
  Administered 2014-01-23 (×2): 2 [IU] via SUBCUTANEOUS
  Administered 2014-01-23 – 2014-01-24 (×7): 1 [IU] via SUBCUTANEOUS
  Administered 2014-01-25 (×3): 2 [IU] via SUBCUTANEOUS

## 2014-01-17 MED ORDER — SODIUM CHLORIDE 0.9 % IV SOLN
1.0000 g | Freq: Two times a day (BID) | INTRAVENOUS | Status: DC
  Administered 2014-01-17 – 2014-01-24 (×15): 1 g via INTRAVENOUS
  Filled 2014-01-17 (×17): qty 1

## 2014-01-17 MED ORDER — INSULIN ASPART 100 UNIT/ML ~~LOC~~ SOLN: 0.0000 [IU] | SUBCUTANEOUS | Status: DC

## 2014-01-17 MED ORDER — POTASSIUM CHLORIDE 10 MEQ/50ML IV SOLN
10.0000 meq | INTRAVENOUS | Status: AC
  Administered 2014-01-17 (×3): 10 meq via INTRAVENOUS
  Filled 2014-01-17 (×3): qty 50

## 2014-01-17 MED ORDER — VANCOMYCIN HCL IN DEXTROSE 750-5 MG/150ML-% IV SOLN
750.0000 mg | INTRAVENOUS | Status: DC
  Administered 2014-01-17 – 2014-01-19 (×3): 750 mg via INTRAVENOUS
  Filled 2014-01-17 (×4): qty 150

## 2014-01-17 MED ORDER — IOHEXOL 300 MG/ML  SOLN
25.0000 mL | INTRAMUSCULAR | Status: AC
  Administered 2014-01-17 (×2): 25 mL via ORAL

## 2014-01-17 NOTE — Progress Notes (Signed)
  Echocardiogram 2D Echocardiogram has been performed.  Donata Clay 01/17/2014, 11:09 AM

## 2014-01-17 NOTE — Progress Notes (Signed)
Subjective: Interval History: none.  Objective: Vital signs in last 24 hours: Temp:  [94.1 F (34.5 C)-97.6 F (36.4 C)] 94.7 F (34.8 C) (04/06 0400) Pulse Rate:  [26-163] 89 (04/06 0645) Resp:  [10-30] 30 (04/06 0645) BP: (56-110)/(45-77) 103/57 mmHg (04/06 0630) SpO2:  [64 %-100 %] 100 % (04/06 0645) Arterial Line BP: (65-99)/(42-60) 96/48 mmHg (04/06 0645) FiO2 (%):  [40 %-100 %] 40 % (04/06 0600) Weight:  [71 kg (156 lb 8.4 oz)] 71 kg (156 lb 8.4 oz) (04/06 0400) Weight change:   Intake/Output from previous day: 04/05 0701 - 04/06 0700 In: 5435.5 [I.V.:4265.5; NG/GT:610; IV Piggyback:560] Out: 2947 [Urine:19; Emesis/NG output:100; MLYYT:0354] Intake/Output this shift:    General appearance: pale and winces on abdm palp, awake , not cooperative Neck: IJ cath Resp: rales bibasilar Cardio: S1, S2 normal GI: distended, erratic bs,  Extremities: edema 4+edema, cool and nontender  Lab Results:  Recent Labs  01/16/14 1744 01/17/14 0447  WBC 61.1* 50.6*  HGB 7.9* 8.6*  HCT 24.6* 26.3*  PLT 206 176   BMET:  Recent Labs  01/16/14 2030 01/17/14 0447  NA 133* 137  K 4.2 3.6*  CL 91* 92*  CO2 9* 12*  GLUCOSE 198* 232*  BUN 54* 34*  CREATININE 4.98* 3.08*  CALCIUM 6.7* 6.8*   No results found for this basename: PTH,  in the last 72 hours Iron Studies: No results found for this basename: IRON, TIBC, TRANSFERRIN, FERRITIN,  in the last 72 hours  Studies/Results: Dg Chest Port 1 View  01/16/2014   CLINICAL DATA:  Hemodialysis catheter placement. Bilateral pulmonary infiltrates.  EXAM: PORTABLE CHEST - 1 VIEW 5:36 p.m.  COMPARISON:  01/16/2014 5:29 a.m., 01/15/2014 and 01/14/2014  FINDINGS: Right jugular vein catheter has been inserted and the tip is in the superior vena cava. No pneumothorax. Left-sided jugular vein catheter tip is also in the superior vena cava. NG tube and endotracheal tube appear in good position. There is increasing infiltrate in the left lung,  probably pulmonary edema. There is a moderate right and small left pleural effusion which are unchanged. There is pulmonary edema at the right lung base which is unchanged. Heart size and vascularity appear normal.  IMPRESSION: New central venous catheter is in good position. Progressive pulmonary edema on the left. No other change. Diffuse emphysematous changes most prominent in the right upper lobe.   Electronically Signed   By: Rozetta Nunnery M.D.   On: 01/16/2014 17:51   Dg Chest Port 1 View  01/16/2014   CLINICAL DATA:  Intubation, respiratory failure  EXAM: PORTABLE CHEST - 1 VIEW  COMPARISON:  01/15/2014  FINDINGS: Endotracheal tube is above the carina. Overall stable support apparatus. Background COPD/ emphysema noted. Similar pattern of diffuse airspace disease versus edema with pleural effusions, larger on the right. Mild cardiac enlargement. Basilar atelectasis also noted. Findings compatible with superimposed CHF, less likely pneumonia. No pneumothorax.  IMPRESSION: Background COPD/ emphysema with superimposed CHF.   Electronically Signed   By: Daryll Brod M.D.   On: 01/16/2014 08:59    I have reviewed the patient's current medications.  Assessment/Plan: 1 AKI some clearance of acid and solute but not adequate. Push clearance with ^ RF and DFR.  Signif acid generation rates suggesting source  ie infx or dead tissue, most likely abdm.  K ok.  Will use iv bicarb until ^ clearance kicks in. 2 ABDM  Suspect infx vs dead tissue. Once more stable, CT 3 Pneu on AB 4 Hypothyroid  on meds 5 VDRF per CCM 6 Fluid xs keep even until BP better 7 Schock  High dose pressors 8  Sz disorder 9  W/C bound 10 Schizoaffective disorder. 11Anemia will not address at this time. P ^ RF, ^DFR, CT ,AB, Vent , sedate , thyroid, steroids     LOS: 9 days   Kadija Cruzen L 01/17/2014,7:03 AM

## 2014-01-17 NOTE — Progress Notes (Signed)
Inpatient Diabetes Program Recommendations  AACE/ADA: New Consensus Statement on Inpatient Glycemic Control (2013)  Target Ranges:  Prepandial:   less than 140 mg/dL      Peak postprandial:   less than 180 mg/dL (1-2 hours)      Critically ill patients:  140 - 180 mg/dL  Results for [name] [name], [name] [name] (MRN [id number]) as of 01/17/2014 12:03  Ref. Range 01/16/2014 00:06 01/16/2014 04:14 01/16/2014 07:35 01/16/2014 11:26 01/16/2014 11:51 01/16/2014 13:04 01/16/2014 15:43 01/16/2014 19:44 01/16/2014 23:56 01/17/2014 04:10 01/17/2014 10:29  Glucose-Capillary Latest Range: 70-99 mg/dL 86 94 65 (L) 48 (L) 187 (H) 140 (H) 111 (H) 150 (H) 203 (H) 218 (H) 206 (H)   Diabetes history: No Outpatient Diabetes medications: NA Current orders for Inpatient glycemic control: None  Inpatient Diabetes Program Recommendations Correction (SSI): Please consider ordering CBGs with Novolog correction scale Q4H.  Thanks, Barnie Alderman, RN, MSN, CCRN Diabetes Coordinator Inpatient Diabetes Program [phone] (Team Pager) [phone] ([facility] office) [phone] [facility] office)

## 2014-01-17 NOTE — Progress Notes (Signed)
MEDICATION RELATED CONSULT NOTE - FOLLOW UP   Pharmacy Consult for Vancomycin, Meropenem; Phenytoin Indication: Sepsis; Hx seizures  Allergies  Allergen Reactions  . Fexofenadine Hcl Other (See Comments)    unknown  . Percocet [Oxycodone-Acetaminophen] Other (See Comments)    unknown  . Thorazine [Chlorpromazine Hcl] Other (See Comments)    unknown    Patient Measurements: Height: 6\' 2"  (188 cm) Weight: 156 lb 8.4 oz (71 kg) IBW/kg (Calculated) : 82.2 Vital Signs: Temp: 94.7 F (34.8 C) (04/06 0400) Temp src: Oral (04/06 0400) BP: 88/49 mmHg (04/06 1256) Pulse Rate: 109 (04/06 1256) Intake/Output from previous day: 04/05 0701 - 04/06 0700 In: 5768.8 [I.V.:4548.8; NG/GT:610; IV Piggyback:610] Out: 6301 [Urine:19; Emesis/NG output:100; Stool:1150] Intake/Output from this shift: Total I/O In: 1751.8 [I.V.:1601.8; IV Piggyback:150] Out: 2087 [Other:2087]  Labs:  Recent Labs  01/15/14 0500 01/16/14 1340 01/16/14 1744 01/16/14 2030 01/17/14 0447 01/17/14 1030  WBC  --   --  61.1*  --  50.6* 41.8*  HGB  --   --  7.9*  --  8.6* 8.9*  HCT  --   --  24.6*  --  26.3* 26.5*  PLT  --   --  206  --  176 141*  APTT  --   --   --   --  129*  --   CREATININE 4.81* 5.08*  --  4.98* 3.08* 2.31*  MG 2.2 2.5  --  2.6* 2.2  --   PHOS 4.3  --   --  7.5* 3.8  --   ALBUMIN 1.3* 1.2*  --  1.3* 1.4* 1.3*  PROT 4.9* 4.7*  --  4.8*  --  5.1*  AST 76* 805*  --  852*  --  693*  ALT 392* 560*  --  578*  --  669*  ALKPHOS 124* 133*  --  143*  --  165*  BILITOT 0.6 0.7  --  0.9  --  1.6*  BILIDIR  --   --   --   --   --  1.3*  IBILI  --   --   --   --   --  0.3   Estimated Creatinine Clearance: 41.4 ml/min (by C-G formula based on Cr of 2.31).   Microbiology: Recent Results (from the past 720 hour(s))  CULTURE, BLOOD (ROUTINE X 2)     Status: None   Collection Time    01-Feb-2014 11:58 PM      Result Value Ref Range Status   Specimen Description BLOOD WRIST RIGHT   Final   Special  Requests BOTTLES DRAWN AEROBIC AND ANAEROBIC 5CC EA   Final   Culture  Setup Time     Final   Value: 01/09/2014 03:47     Performed at [facility]   Culture     Final   Value: NO GROWTH 5 DAYS     Performed at [facility]   Report Status 01/15/2014 FINAL   Final  CULTURE, BLOOD (ROUTINE X 2)     Status: None   Collection Time    01/09/14 12:24 AM      Result Value Ref Range Status   Specimen Description BLOOD LEFT ARM   Final   Special Requests BOTTLES DRAWN AEROBIC ONLY 10CC   Final   Culture  Setup Time     Final   Value: 01/09/2014 03:47     Performed at [place]     Final  Value: NO GROWTH 5 DAYS     Performed at [facility]   Report Status 01/15/2014 FINAL   Final  URINE CULTURE     Status: None   Collection Time    01/09/14  2:42 AM      Result Value Ref Range Status   Specimen Description URINE, CATHETERIZED   Final   Special Requests ADD [id number]   Final   Culture  Setup Time     Final   Value: 01/09/2014 14:10     Performed at [place]     Final   Value: NO GROWTH     Performed at [facility]   Culture     Final   Value: NO GROWTH     Performed at [facility]   Report Status 01/10/2014 FINAL   Final  MRSA PCR SCREENING     Status: Abnormal   Collection Time    01/09/14  4:50 AM      Result Value Ref Range Status   MRSA by PCR POSITIVE (*) NEGATIVE Final   Comment:            The GeneXpert MRSA Assay (FDA     approved for NASAL specimens     only), is one component of a     comprehensive MRSA colonization     surveillance program. It is not     intended to diagnose MRSA     infection nor to guide or     monitor treatment for     MRSA infections.     RESULT CALLED TO, READ BACK BY AND VERIFIED WITH:     MACK,B RN [phone] 01/09/14 MITCHELL,L  CULTURE, RESPIRATORY (NON-EXPECTORATED)     Status: None   Collection Time    01/11/14 11:53 AM      Result Value Ref  Range Status   Specimen Description TRACHEAL ASPIRATE   Final   Special Requests NONE   Final   Gram Stain     Final   Value: NO WBC SEEN     NO SQUAMOUS EPITHELIAL CELLS SEEN     NO ORGANISMS SEEN     Performed at [facility]   Culture     Final   Value: NO GROWTH 2 DAYS     Performed at [facility]   Report Status 01/13/2014 FINAL   Final  CLOSTRIDIUM DIFFICILE BY PCR     Status: None   Collection Time    01/14/14  2:17 PM      Result Value Ref Range Status   C difficile by pcr NEGATIVE  NEGATIVE Final  CLOSTRIDIUM DIFFICILE BY PCR     Status: None   Collection Time    01/16/14  9:57 PM      Result Value Ref Range Status   C difficile by pcr NEGATIVE  NEGATIVE Final  CULTURE, RESPIRATORY (NON-EXPECTORATED)     Status: None   Collection Time    01/16/14  9:59 PM      Result Value Ref Range Status   Specimen Description TRACHEAL ASPIRATE   Final   Special Requests NONE   Final   Gram Stain     Final   Value: FEW WBC PRESENT,BOTH PMN AND MONONUCLEAR     NO SQUAMOUS EPITHELIAL CELLS SEEN     ABUNDANT YEAST     Performed at [facility]     Final  Value: Culture reincubated for better growth     Performed at [facility]   Report Status PENDING   Incomplete    Assessment:  Antibiotics:  16 YOM with septic shock/HCAP on day #9 empiric abx to continue vancomycin and switch from Zosyn to meropenem with worsening status. Patient has acute liver and renal dysfunction. Started on CRRT on 4/5. He is hypothermic and WBC significantly elevated but trending down.   3/29 Vanc >> 3/29 Zosyn >>4/6 4/6 Mero>> 4/5 Vanc PO>> 4/5 Flagyl>> 4/5 Micafungin>>  4/1 VT>>42.2 -- vanc held 4/5 VR>>24.1 -- vanc held 4/6 VR>>14.6  3/31 TA>neg 3/29 Urine>neg 3/29 Blood>neg 3/29 MRSA PCR - pos 4/3 Cdiff>neg  ---------- Phenytoin: Patient on phenytoin PTA for hx of seizures. No current seizure activity.   DPH 300 QHS PTA (~4.7mg /kg/d) >>  100mg  IV Q8H on 3/30 >> holding since 4/1.  3/29 level 7.9 >> corr 12.7 4/1 level 12.8 >> corr 28.87 (no reported nystagmus)  4/2 level 9.3 >> corr 21.1 4/3 level 8.5>> corr 21.2  4/5 level 5.3>> corr 15.5 with new alb 1.2 >> currently clearing 3mg /hr.   Goal of Therapy:  Vancomycin goal of 15-20 mcg/ml Phenytoin goal of 10-20 ug/ml  Plan:  Antibiotics:  - Restart vancomycin today at 750 mg IV q24h (~10mg /kg) - Start meropenem today at 1 gm IV q12h  - F/u repeat C&S, temp, WBC trend, vanc levels prn, clinical status  Phenytoin:  - Discussed case with Dr. Titus Mould -- will hold off on phenytoin restart today - Phenytoin level in am    Harolyn Rutherford, PharmD Clinical Pharmacist - Resident Pager: [phone] Pharmacy: [phone] 01/17/2014 2:27 PM

## 2014-01-17 NOTE — Progress Notes (Signed)
PULMONARY / CRITICAL CARE MEDICINE   Name: [name] [name] MRN: [id number] DOB: [birth year]    ADMISSION DATE:  12/14/2013  PRIMARY SERVICE: PCCM  CHIEF COMPLAINT:  SOB, leg pain.  BRIEF PATIENT DESCRIPTION:  43 years old male resident of an assisted living facility with PMH relevant for schizoaffective disorder, polysubstance abuse, seizures, COPD,  malnutrition, GERD. Presented with SOB and found to be hypotensive despite 2 L of IVF's. Started on dopamine via peripheral IV by ED and  PCCM called to admit. No clear source of infection.   SIGNIFICANT EVENTS / STUDIES:  3/29 Admitted to PCCM with septic shock and HCAP. Resuscitated with IVFs, vasopressors. Intubated shortly after admission. Bicarbonate infusion initiated for severe metabolic acidosis 3/35 Persistent septic shock. Worsening renal function. Acidosis improved. HCO3 stopped 3/31 Off vasopressors. Severe agitation @ times. Fentanyl and dex infusions initiated. Not able to F/C. Oliguric with worsening renal indices. Foley cath changed out without improvement in Uo. No indication for HD at this time 4/1 Close to extubation, urine output slightly improved, will need to contact wife for discussion of goals of care 4/2 Still agitated, self-extubated, re-intubated successfully. Continued worsening renal function. No HCPA or family/significant other contact. Will consult with ethics committee to resume responsibility for [place] decisions.  4/3 Renal function no worse. Hepatic function improving.  4/4 Started phenylephrine  4/5 Continued hypotension and oliguria. Unequal pupils. Severe metabolic acidosis, started on bicarb drip and CRRT. Started levophed. Started stress-steroids. Panculture for source of infection (likely abdominal). 4/6  Slowly improving acidosis on CRRT.   LINES / TUBES: L IJ CVL 3/29 >>  ETT 3/29 >> 4/2, 4/2 (self extubated and re-intubated) >>   HD RIJ catheter 4/5 >> A-line 4/5 >>  CULTURES: MRSA PCR 3/29 >>  POS Urine 3/29 >> NG Blood 3/28 >> NG Blood 3/29 >> NG Blood 4/5 >> Urine 4/5 >> Tracheal 3/31 >> Gram stain neg,  NG C diff 4/3 >> Neg C diff 4/5 >> Gi Pathogen 4/5>> Tracheal 4/5 >>  ANTIBIOTICS: IV Vanc 3/29 >> 4/2; 4/5 >> Pip-tazo 3/29 >>  IV flagyl 4/5 >> PO vanc 4/6 >> IV micafungin 4/5 >>  SUBJECTIVE:  RASS -3 to +3.  Pt not able to follow commands.  Pt with continued diarrhea (specs of blood?).  No reports of pain.  VITAL SIGNS: Temp:  [94.1 F (34.5 C)-97.6 F (36.4 C)] 94.7 F (34.8 C) (04/06 0400) Pulse Rate:  [26-163] 86 (04/06 0600) Resp:  [10-30] 30 (04/06 0600) BP: (56-110)/(45-77) 100/57 mmHg (04/06 0600) SpO2:  [64 %-100 %] 100 % (04/06 0600) Arterial Line BP: (65-99)/(42-60) 91/49 mmHg (04/06 0600) FiO2 (%):  [40 %-100 %] 40 % (04/06 0600) Weight:  [71 kg (156 lb 8.4 oz)] 71 kg (156 lb 8.4 oz) (04/06 0400) HEMODYNAMICS: CVP:  [14 mmHg] 14 mmHg VENTILATOR SETTINGS: Vent Mode:  [-] PRVC FiO2 (%):  [40 %-100 %] 40 % Set Rate:  [16 bmp-30 bmp] 30 bmp Vt Set:  [600 mL] 600 mL PEEP:  [5 cmH20] 5 cmH20 Plateau Pressure:  [20 cmH20-34 cmH20] 30 cmH20 INTAKE / OUTPUT: Intake/Output     04/05 0701 - 04/06 0700   I.V. (mL/kg) 4265.5 (60.1)   NG/GT 610   IV Piggyback 560   Total Intake(mL/kg) 5435.5 (76.6)   Urine (mL/kg/hr) 19 (0)   Emesis/NG output 100 (0.1)   Other 3028 (1.8)   Stool 1150 (0.7)   Total Output 4297   Net +1138.5  PHYSICAL EXAMINATION: General: Looks critically ill, sedated  HEENT: WNL Cardiac: RRR  Lungs: Ronchi in anterior fields   Abdomen: Distended, diminished BS Ext: SCD's in place, cool, +3-4 edema up to abdomen GU: scrotal swelling Neuro: Unequal pupil  5 mm L>3 mmR RASS -3 to +3  PULMONARY  Recent Labs Lab 01/16/14 1222 01/16/14 1601 01/16/14 2019 01/16/14 2135 01/17/14 0529  PHART  --  6.864* 6.858* 6.987* 7.185*  PCO2ART  --  42.0 47.4* 44.0 32.2*  PO2ART  --  40.0* 50.0* 388.0* 70.0*  HCO3  --   7.7* 8.5* 10.6* 12.5*  TCO2  --  _0 O2SAT 60.9 43.0 58.0 100.0 92.0    CBC  Recent Labs Lab 01/14/14 0658 01/16/14 1744 01/17/14 0447  HGB 8.1* 7.9* 8.6*  HCT 24.5* 24.6* 26.3*  WBC 33.1* 61.1* PENDING  PLT 140* 206 PENDING    COAGULATION  Recent Labs Lab 01/12/14 0500 01/16/14 2030  INR 2.06* 2.50*     CHEMISTRY  Recent Labs Lab 01/12/14 0106  01/14/14 0500 01/14/14 1000 01/15/14 0500 01/16/14 1340 01/16/14 2030 01/17/14 0447  NA  --   < > 137  --  140 137 133* 137  K  --   < > 3.5*  --  4.0 4.6 4.2 3.6*  CL  --   < > 99  --  99 95* 91* 92*  CO2  --   < > 20  --  17* 8* 9* 12*  GLUCOSE  --   < > 127*  --  109* 231* 198* 232*  BUN  --   < > 51*  --  60* 58* 54* 34*  CREATININE  --   < > 4.57*  --  4.81* 5.08* 4.98* 3.08*  CALCIUM  --   < > 5.9*  --  6.6* 6.9* 6.7* 6.8*  MG 2.1  < >  --  2.2 2.2 2.5 2.6* 2.2  PHOS 5.6*  --  3.7  --  4.3  --  7.5* 3.8  < > = values in this interval not displayed. Estimated Creatinine Clearance: 31.1 ml/min (by C-G formula based on Cr of 3.08).   LIVER  Recent Labs Lab 01/12/14 0500 01/14/14 0500 01/15/14 0500 01/16/14 1340 01/16/14 2030 01/17/14 0447  AST 801* 102* 76* 805* 852*  --   ALT 1367* 528* 392* 560* 578*  --   ALKPHOS 109 96 124* 133* 143*  --   BILITOT 0.4 1.0 0.6 0.7 0.9  --   PROT 4.4* 4.9* 4.9* 4.7* 4.8*  --   ALBUMIN 1.7* 1.5* 1.3* 1.2* 1.3* 1.4*  INR 2.06*  --   --   --  2.50*  --      INFECTIOUS  Recent Labs Lab 01/15/14 1433 01/16/14 0500 01/16/14 1623 01/17/14 0154 01/17/14 0447  LATICACIDVEN  --   --  11.6* 11.5*  --   PROCALCITON 2.43 3.50  --   --  2.66     ENDOCRINE CBG (last 3)   Recent Labs  01/16/14 1944 01/16/14 2356 01/17/14 0410  GLUCAP 150* 203* 218*     IMAGING x48h  Dg Chest Port 1 View  01/16/2014   CLINICAL DATA:  Hemodialysis catheter placement. Bilateral pulmonary infiltrates.  EXAM: PORTABLE CHEST - 1 VIEW 5:36 p.m.  COMPARISON:  01/16/2014  5:29 a.m., 01/15/2014 and 01/14/2014  FINDINGS: Right jugular vein catheter has been inserted and the tip is in the superior vena cava. No pneumothorax. Left-sided jugular vein  catheter tip is also in the superior vena cava. NG tube and endotracheal tube appear in good position. There is increasing infiltrate in the left lung, probably pulmonary edema. There is a moderate right and small left pleural effusion which are unchanged. There is pulmonary edema at the right lung base which is unchanged. Heart size and vascularity appear normal.  IMPRESSION: New central venous catheter is in good position. Progressive pulmonary edema on the left. No other change. Diffuse emphysematous changes most prominent in the right upper lobe.   Electronically Signed   By: Rozetta Nunnery M.D.   On: 01/16/2014 17:51   Dg Chest Port 1 View  01/16/2014   CLINICAL DATA:  Intubation, respiratory failure  EXAM: PORTABLE CHEST - 1 VIEW  COMPARISON:  01/15/2014  FINDINGS: Endotracheal tube is above the carina. Overall stable support apparatus. Background COPD/ emphysema noted. Similar pattern of diffuse airspace disease versus edema with pleural effusions, larger on the right. Mild cardiac enlargement. Basilar atelectasis also noted. Findings compatible with superimposed CHF, less likely pneumonia. No pneumothorax.  IMPRESSION: Background COPD/ emphysema with superimposed CHF.   Electronically Signed   By: Daryll Brod M.D.   On: 01/16/2014 08:59   Dg Chest Port 1 View  01/15/2014   CLINICAL DATA:  Respiratory failure.  EXAM: PORTABLE CHEST - 1 VIEW  COMPARISON:  01/14/2014, 01/13/2014, 01/12/2014, 01/11/2014, and 01/09/2014  FINDINGS: Endotracheal tube, NG tube, and central catheter appear in good position. There is recurrent pulmonary edema in the left perihilar region. Moderate right pleural effusion has slightly increased. Pulmonary edema at the right base is unchanged. Slight atelectasis at the left base has increased.  IMPRESSION:  Recurrent pulmonary edema with slight increase in the right effusion.   Electronically Signed   By: Rozetta Nunnery M.D.   On: 01/15/2014 08:15     ASSESSMENT / PLAN:  PULMONARY A: Ventilator Dependent Respiratory Failure - secondary to HCAP (?)  Pulmonary Edema with b/l effusions Uncompensated met acidosis COPD - emphysema R/o ARDS P:   Too high MV , no sbt Keep TV 600, keep plat less 30, assess cvp - 14-9, possible ARDS component ( await echo) He is tolerating rate thus far, no auto-peep noted , if so, will lower rate and have permissive hypercapnia Albuterol neb and duoneb PRN F/U CXR in am Keep fio2, peep 5, 40% for now  CARDIOVASCULAR A:  Septic shock, mild stress ischemia   -Continued hypotension despite re-initiating pressor support P:  -Phenylephrine to wean as weak pressor in setting strong levophed -Norepinephrine started 4/5 to maintain MAP > 60 mm Hg -F/U 2D-echo awaited -no further trop needed -ensure asa in future as blood noted rectal -cvp to goal currently -dc roids, cortisol adaquate  RENAL A:   Acute kidney failure, oligo-anuric -  likely due to ATN (severe sepsis)     -currently on CRRT, still oliguric-anuric  Anion Gap Metabolic Acidosis   - slowly improving, elevated lactic acid levels concerning for worsening sepsis vs  ischemic bowel MEt alk Hypokalemia  Hypocalcemia - resolved P:   Renal following Continue CRRT Monitor I/Os Correct electrolytes as indicated Avoid nephrotoxins Serial ABG's at noon Consider dc bicarb, will not change outcome with LA Re assess cvp Assess bladder pressure   GASTROINTESTINAL A:   Diarrhea   -possible c diff? H/O GERD, chronic PPI use ransaminitis secondary to severe sepsis Assess for abdominal compartment Mild pancreatitis? P:   F/U c diff F/U CT abd, send asap IV famotidine Tube feedings  osmolite and pro-stat initiated 3/31 abdo pressure Monitor CMP in am  Repeat amy, lip, LDH, LFt today acute hep  panel  HEMATOLOGIC A:   Normocytic Anemia     - likely due to severe sepsis  Mild thrombocytopenia - resolved Coagulapthy, r/o dic, liver dz P:  SQ heparin DVT ppx Monitor CBC intermittently Repeat fibrinogen Factor 8   INFECTIOUS A:   Severe sepsis  (AKI, transaminitis, and encephalopathy)   - possible abdominal source, ischemic bowel vs colitis vs pancreatitis  Leukocytosis   RLL HCAP, NOS R/o cdiff P:   Cont antibiotics  lactic acid in am  F/U CT w/o cont head, abdomen, and chest, need done today continue cdiff treatment until neg x 2 Zosyn with worsening status, dc vanc residual remains, level today Add Meropenem follow repeat BC Ensure HIV  ENDOCRINE A:   NO Adrenal Insufficiency Hypothyroidism Hyperglycemia  P:   Cont L-thyroxine  Assess tsh Start SSI if CBG >180 CBG Q 6 hr D50 as needed for CBG <70 Dc stress steroids  NEUROLOGIC/PSYCHOSOCIAL A:   Unequal Pupils - anisoria? Acute encephalopathy with severe agitation Schizoaffective disorder Seizure disorder  No HCPOA identified P:   Versed infusion and PRN started 4/5  Fentanyl infusions Hold Remeron, Zyprexa, Cogentin  Cont Haldol and versed PRN IV phenytoin per pharmacy, holding ,level in am  F/U CT w/o cont head today May need eeg  RABBANI, Ricarda Frame PGY1- IMTS  01/17/2014 6:10 AM  I have fully examined this patient and agree with above findings.    And edited in full  Ccm time 50 min   Lavon Paganini. Titus Mould, MD, [place] Pgr: [facility]

## 2014-01-17 NOTE — Progress Notes (Signed)
UR Completed.  Vergie Living [id number] 01/17/2014

## 2014-01-18 ENCOUNTER — Inpatient Hospital Stay (HOSPITAL_COMMUNITY): Payer: Medicare Other | Admitting: Certified Registered"

## 2014-01-18 ENCOUNTER — Inpatient Hospital Stay (HOSPITAL_COMMUNITY): Payer: Medicare Other

## 2014-01-18 ENCOUNTER — Encounter (HOSPITAL_COMMUNITY): Admission: EM | Disposition: E | Payer: Self-pay | Source: Skilled Nursing Facility | Attending: Internal Medicine

## 2014-01-18 ENCOUNTER — Encounter (HOSPITAL_COMMUNITY): Payer: Medicare Other | Admitting: Certified Registered"

## 2014-01-18 DIAGNOSIS — S31109A Unspecified open wound of abdominal wall, unspecified quadrant without penetration into peritoneal cavity, initial encounter: Secondary | ICD-10-CM

## 2014-01-18 DIAGNOSIS — R0602 Shortness of breath: Secondary | ICD-10-CM | POA: Diagnosis not present

## 2014-01-18 DIAGNOSIS — A4102 Sepsis due to Methicillin resistant Staphylococcus aureus: Secondary | ICD-10-CM | POA: Diagnosis not present

## 2014-01-18 DIAGNOSIS — K55059 Acute (reversible) ischemia of intestine, part and extent unspecified: Secondary | ICD-10-CM

## 2014-01-18 HISTORY — PX: BOWEL RESECTION: SHX1257

## 2014-01-18 HISTORY — PX: APPLICATION OF WOUND VAC: SHX5189

## 2014-01-18 HISTORY — PX: LAPAROTOMY: SHX154

## 2014-01-18 LAB — COMPREHENSIVE METABOLIC PANEL
ALBUMIN: 1.2 g/dL — AB (ref 3.5–5.2)
ALK PHOS: 138 U/L — AB (ref 39–117)
ALT: 495 U/L — ABNORMAL HIGH (ref 0–53)
AST: 351 U/L — ABNORMAL HIGH (ref 0–37)
BUN: 14 mg/dL (ref 6–23)
CO2: 24 mEq/L (ref 19–32)
Calcium: 6.5 mg/dL — ABNORMAL LOW (ref 8.4–10.5)
Chloride: 97 mEq/L (ref 96–112)
Creatinine, Ser: 1.2 mg/dL (ref 0.50–1.35)
GFR calc Af Amer: 84 mL/min — ABNORMAL LOW (ref >90)
GFR calc non Af Amer: 73 mL/min — ABNORMAL LOW (ref >90)
Glucose, Bld: 124 mg/dL — ABNORMAL HIGH (ref 70–99)
POTASSIUM: 3.9 meq/L (ref 3.7–5.3)
Sodium: 136 mEq/L — ABNORMAL LOW (ref 137–147)
TOTAL PROTEIN: 4.3 g/dL — AB (ref 6.0–8.3)
Total Bilirubin: 1.8 mg/dL — ABNORMAL HIGH (ref 0.3–1.2)

## 2014-01-18 LAB — RENAL FUNCTION PANEL
Albumin: 2.1 g/dL — ABNORMAL LOW (ref 3.5–5.2)
BUN: 9 mg/dL (ref 6–23)
CALCIUM: 6.7 mg/dL — AB (ref 8.4–10.5)
CHLORIDE: 97 meq/L (ref 96–112)
CO2: 22 mEq/L (ref 19–32)
CREATININE: 0.92 mg/dL (ref 0.50–1.35)
GFR calc Af Amer: >90 mL/min (ref >90)
GFR calc non Af Amer: >90 mL/min (ref >90)
Glucose, Bld: 157 mg/dL — ABNORMAL HIGH (ref 70–99)
Phosphorus: 3 mg/dL (ref 2.3–4.6)
Potassium: 3.8 mEq/L (ref 3.7–5.3)
Sodium: 137 mEq/L (ref 137–147)

## 2014-01-18 LAB — PREPARE RBC (CROSSMATCH)

## 2014-01-18 LAB — DIFFERENTIAL
Basophils Absolute: 0.4 10*3/uL — ABNORMAL HIGH (ref 0.0–0.1)
Basophils Relative: 1 % (ref 0–1)
EOS PCT: 0 % (ref 0–5)
Eosinophils Absolute: 0 10*3/uL (ref 0.0–0.7)
Lymphocytes Relative: 5 % — ABNORMAL LOW (ref 12–46)
Lymphs Abs: 2.1 10*3/uL (ref 0.7–4.0)
MONO ABS: 1.2 10*3/uL — AB (ref 0.1–1.0)
MONOS PCT: 3 % (ref 3–12)
Neutro Abs: 37.3 10*3/uL — ABNORMAL HIGH (ref 1.7–7.7)
Neutrophils Relative %: 91 % — ABNORMAL HIGH (ref 43–77)
WBC Morphology: INCREASED

## 2014-01-18 LAB — CBC
HCT: 21.8 % — ABNORMAL LOW (ref 39.0–52.0)
HCT: 28 % — ABNORMAL LOW (ref 39.0–52.0)
HEMATOCRIT: 30.6 % — AB (ref 39.0–52.0)
HEMOGLOBIN: 10.4 g/dL — AB (ref 13.0–17.0)
HEMOGLOBIN: 9.7 g/dL — AB (ref 13.0–17.0)
Hemoglobin: 7.5 g/dL — ABNORMAL LOW (ref 13.0–17.0)
MCH: 29.8 pg (ref 26.0–34.0)
MCH: 30 pg (ref 26.0–34.0)
MCH: 29.9 pg (ref 26.0–34.0)
MCHC: 34.4 g/dL (ref 30.0–36.0)
MCHC: 34 g/dL (ref 30.0–36.0)
MCHC: 34.6 g/dL (ref 30.0–36.0)
MCV: 86.5 fL (ref 78.0–100.0)
MCV: 88.2 fL (ref 78.0–100.0)
MCV: 86.4 fL (ref 78.0–100.0)
Platelets: DECREASED 10*3/uL (ref 150–400)
Platelets: 36 10*3/uL — ABNORMAL LOW (ref 150–400)
RBC: 2.52 MIL/uL — AB (ref 4.22–5.81)
RBC: 3.47 MIL/uL — AB (ref 4.22–5.81)
RBC: 3.24 MIL/uL — ABNORMAL LOW (ref 4.22–5.81)
RDW: 16.2 % — ABNORMAL HIGH (ref 11.5–15.5)
RDW: 14.8 % (ref 11.5–15.5)
RDW: 14.7 % (ref 11.5–15.5)
WBC: 41 10*3/uL — AB (ref 4.0–10.5)
WBC: 33.7 10*3/uL — AB (ref 4.0–10.5)
WBC: 32.6 10*3/uL — AB (ref 4.0–10.5)

## 2014-01-18 LAB — BLOOD PRODUCT ORDER (VERBAL) VERIFICATION

## 2014-01-18 LAB — CULTURE, RESPIRATORY

## 2014-01-18 LAB — CULTURE, RESPIRATORY W GRAM STAIN

## 2014-01-18 LAB — MAGNESIUM: MAGNESIUM: 2 mg/dL (ref 1.5–2.5)

## 2014-01-18 LAB — BLOOD GAS, ARTERIAL
Acid-base deficit: 0.6 mmol/L (ref 0.0–2.0)
BICARBONATE: 24.7 meq/L — AB (ref 20.0–24.0)
Drawn by: 39866
FIO2: 0.5 %
MECHVT: 600 mL
O2 SAT: 94.8 %
PEEP/CPAP: 5 cmH2O
Patient temperature: 97.5
RATE: 26 resp/min
TCO2: 26.2 mmol/L (ref 0–100)
pCO2 arterial: 48.3 mmHg — ABNORMAL HIGH (ref 35.0–45.0)
pH, Arterial: 7.326 — ABNORMAL LOW (ref 7.350–7.450)
pO2, Arterial: 66.6 mmHg — ABNORMAL LOW (ref 80.0–100.0)

## 2014-01-18 LAB — GLUCOSE, CAPILLARY
GLUCOSE-CAPILLARY: 123 mg/dL — AB (ref 70–99)
GLUCOSE-CAPILLARY: 121 mg/dL — AB (ref 70–99)
GLUCOSE-CAPILLARY: 153 mg/dL — AB (ref 70–99)
Glucose-Capillary: 119 mg/dL — ABNORMAL HIGH (ref 70–99)
Glucose-Capillary: 146 mg/dL — ABNORMAL HIGH (ref 70–99)

## 2014-01-18 LAB — PATHOLOGIST SMEAR REVIEW

## 2014-01-18 LAB — LACTIC ACID, PLASMA: LACTIC ACID, VENOUS: 5.9 mmol/L — AB (ref 0.5–2.2)

## 2014-01-18 LAB — PHENYTOIN LEVEL, TOTAL: PHENYTOIN LVL: 5.3 ug/mL — AB (ref 10.0–20.0)

## 2014-01-18 LAB — PHOSPHORUS: Phosphorus: 1.5 mg/dL — ABNORMAL LOW (ref 2.3–4.6)

## 2014-01-18 LAB — PROTIME-INR
INR: 2.28 — ABNORMAL HIGH (ref 0.00–1.49)
INR: 1.91 — AB (ref 0.00–1.49)
Prothrombin Time: 24.4 seconds — ABNORMAL HIGH (ref 11.6–15.2)
Prothrombin Time: 21.3 seconds — ABNORMAL HIGH (ref 11.6–15.2)

## 2014-01-18 LAB — APTT: aPTT: 194 seconds — ABNORMAL HIGH (ref 24–37)

## 2014-01-18 LAB — LIPASE, BLOOD: Lipase: 34 U/L (ref 11–59)

## 2014-01-18 LAB — FACTOR 8 ASSAY: Coagulation Factor VIII: 600 % — ABNORMAL HIGH (ref 73–140)

## 2014-01-18 SURGERY — LAPAROTOMY, EXPLORATORY
Anesthesia: General | Site: Abdomen

## 2014-01-18 MED ORDER — FENTANYL CITRATE 0.05 MG/ML IJ SOLN
INTRAMUSCULAR | Status: AC
  Filled 2014-01-18: qty 5

## 2014-01-18 MED ORDER — 0.9 % SODIUM CHLORIDE (POUR BTL) OPTIME
TOPICAL | Status: DC | PRN
  Administered 2014-01-18: 1000 mL

## 2014-01-18 MED ORDER — POTASSIUM PHOSPHATE DIBASIC 3 MMOLE/ML IV SOLN
20.0000 meq | Freq: Once | INTRAVENOUS | Status: DC
  Filled 2014-01-18: qty 4.55

## 2014-01-18 MED ORDER — BIOTENE DRY MOUTH MT LIQD: 15.0000 mL | Freq: Four times a day (QID) | OROMUCOSAL | Status: DC

## 2014-01-18 MED ORDER — CHLORHEXIDINE GLUCONATE 0.12 % MT SOLN: 15.0000 mL | Freq: Two times a day (BID) | OROMUCOSAL | Status: DC

## 2014-01-18 MED ORDER — SODIUM PHOSPHATE 3 MMOLE/ML IV SOLN
20.0000 mmol | Freq: Once | INTRAVENOUS | Status: AC
  Administered 2014-01-18: 20 mmol via INTRAVENOUS
  Filled 2014-01-18: qty 6.67

## 2014-01-18 MED ORDER — VITAMIN K1 10 MG/ML IJ SOLN
10.0000 mg | Freq: Once | INTRAVENOUS | Status: AC
  Administered 2014-01-18: 10 mg via INTRAVENOUS
  Filled 2014-01-18: qty 1

## 2014-01-18 MED ORDER — PROPOFOL 10 MG/ML IV BOLUS
INTRAVENOUS | Status: DC | PRN
  Administered 2014-01-18: 30 mg via INTRAVENOUS

## 2014-01-18 MED ORDER — MIDAZOLAM HCL 2 MG/2ML IJ SOLN
INTRAMUSCULAR | Status: AC
  Filled 2014-01-18: qty 2

## 2014-01-18 MED ORDER — LEVOTHYROXINE SODIUM 100 MCG IV SOLR
37.5000 ug | Freq: Every day | INTRAVENOUS | Status: DC
  Administered 2014-01-18 – 2014-01-22 (×5): 37.5 ug via INTRAVENOUS
  Administered 2014-01-23: 50 ug via INTRAVENOUS
  Administered 2014-01-24 – 2014-01-25 (×2): 37.5 ug via INTRAVENOUS
  Filled 2014-01-18 (×8): qty 5

## 2014-01-18 MED ORDER — SODIUM BICARBONATE 4.2 % IV SOLN
INTRAVENOUS | Status: DC | PRN
  Administered 2014-01-18: 50 meq via INTRAVENOUS

## 2014-01-18 MED ORDER — NOREPINEPHRINE BITARTRATE 1 MG/ML IJ SOLN
4000.0000 ug | INTRAVENOUS | Status: DC | PRN
  Administered 2014-01-18: 50 ug/min via INTRAVENOUS

## 2014-01-18 MED ORDER — PANTOPRAZOLE SODIUM 40 MG IV SOLR
40.0000 mg | INTRAVENOUS | Status: DC
  Administered 2014-01-18 – 2014-01-20 (×3): 40 mg via INTRAVENOUS
  Filled 2014-01-18 (×4): qty 40

## 2014-01-18 MED ORDER — ROCURONIUM BROMIDE 50 MG/5ML IV SOLN
INTRAVENOUS | Status: AC
  Filled 2014-01-18: qty 1

## 2014-01-18 MED ORDER — MIDAZOLAM HCL 5 MG/5ML IJ SOLN
INTRAMUSCULAR | Status: DC | PRN
  Administered 2014-01-18 (×2): 2 mg via INTRAVENOUS

## 2014-01-18 MED ORDER — SODIUM CHLORIDE 0.9 % IV SOLN
2500.0000 ug | INTRAVENOUS | Status: DC | PRN
  Administered 2014-01-18: 400 ug/h via INTRAVENOUS

## 2014-01-18 MED ORDER — LIDOCAINE HCL (CARDIAC) 20 MG/ML IV SOLN
INTRAVENOUS | Status: AC
  Filled 2014-01-18: qty 5

## 2014-01-18 MED ORDER — PROPOFOL 10 MG/ML IV BOLUS
INTRAVENOUS | Status: AC
  Filled 2014-01-18: qty 20

## 2014-01-18 MED ORDER — DEXTROSE 5 % IV SOLN
10.0000 mg | INTRAVENOUS | Status: DC | PRN
  Administered 2014-01-18: 15 ug/min via INTRAVENOUS

## 2014-01-18 MED ORDER — PHENYLEPHRINE 40 MCG/ML (10ML) SYRINGE FOR IV PUSH (FOR BLOOD PRESSURE SUPPORT)
PREFILLED_SYRINGE | INTRAVENOUS | Status: AC
  Filled 2014-01-18: qty 10

## 2014-01-18 MED ORDER — LEVOTHYROXINE SODIUM 100 MCG IV SOLR: 37.5000 ug | Freq: Every day | INTRAVENOUS | Status: DC

## 2014-01-18 MED ORDER — NOREPINEPHRINE BITARTRATE 1 MG/ML IJ SOLN
2.0000 ug/min | INTRAVENOUS | Status: DC
  Administered 2014-01-18 – 2014-01-19 (×4): 40 ug/min via INTRAVENOUS
  Administered 2014-01-20: 45 ug/min via INTRAVENOUS
  Administered 2014-01-20: 38 ug/min via INTRAVENOUS
  Administered 2014-01-20 – 2014-01-21 (×2): 30 ug/min via INTRAVENOUS
  Administered 2014-01-21: 25 ug/min via INTRAVENOUS
  Filled 2014-01-18 (×10): qty 16

## 2014-01-18 SURGICAL SUPPLY — 51 items
BENZOIN TINCTURE PRP APPL 2/3 (GAUZE/BANDAGES/DRESSINGS) ×4 IMPLANT
BLADE SURG ROTATE 9660 (MISCELLANEOUS) IMPLANT
CANISTER SUCTION 2500CC (MISCELLANEOUS) ×4 IMPLANT
CANISTER WOUND CARE 500ML ATS (WOUND CARE) ×4 IMPLANT
CHLORAPREP W/TINT 26ML (MISCELLANEOUS) ×4 IMPLANT
COVER MAYO STAND STRL (DRAPES) IMPLANT
COVER SURGICAL LIGHT HANDLE (MISCELLANEOUS) ×4 IMPLANT
DRAPE LAPAROSCOPIC ABDOMINAL (DRAPES) ×4 IMPLANT
DRAPE PROXIMA HALF (DRAPES) IMPLANT
DRAPE UTILITY 15X26 W/TAPE STR (DRAPE) ×8 IMPLANT
DRAPE WARM FLUID 44X44 (DRAPE) ×4 IMPLANT
DRSG OPSITE POSTOP 4X10 (GAUZE/BANDAGES/DRESSINGS) IMPLANT
DRSG OPSITE POSTOP 4X8 (GAUZE/BANDAGES/DRESSINGS) IMPLANT
ELECT BLADE 6.5 EXT (BLADE) IMPLANT
ELECT CAUTERY BLADE 6.4 (BLADE) ×8 IMPLANT
ELECT REM PT RETURN 9FT ADLT (ELECTROSURGICAL) ×4
ELECTRODE REM PT RTRN 9FT ADLT (ELECTROSURGICAL) ×2 IMPLANT
GLOVE BIOGEL PI IND STRL 6 (GLOVE) ×2 IMPLANT
GLOVE BIOGEL PI IND STRL 8 (GLOVE) ×4 IMPLANT
GLOVE BIOGEL PI INDICATOR 6 (GLOVE) ×2
GLOVE BIOGEL PI INDICATOR 8 (GLOVE) ×4
GLOVE ECLIPSE 7.5 STRL STRAW (GLOVE) ×4 IMPLANT
GLOVE SURG SS PI 6.0 STRL IVOR (GLOVE) ×4 IMPLANT
GOWN STRL REUS W/ TWL LRG LVL3 (GOWN DISPOSABLE) ×6 IMPLANT
GOWN STRL REUS W/TWL LRG LVL3 (GOWN DISPOSABLE) ×6
KIT BASIN OR (CUSTOM PROCEDURE TRAY) ×4 IMPLANT
KIT ROOM TURNOVER OR (KITS) ×4 IMPLANT
LIGASURE IMPACT 36 18CM CVD LR (INSTRUMENTS) ×4 IMPLANT
NS IRRIG 1000ML POUR BTL (IV SOLUTION) ×16 IMPLANT
PACK GENERAL/GYN (CUSTOM PROCEDURE TRAY) ×4 IMPLANT
PAD ARMBOARD 7.5X6 YLW CONV (MISCELLANEOUS) ×4 IMPLANT
PENCIL BUTTON HOLSTER BLD 10FT (ELECTRODE) IMPLANT
RELOAD PROXIMATE 75MM BLUE (ENDOMECHANICALS) ×4 IMPLANT
SEPRAFILM PROCEDURAL PACK 3X5 (MISCELLANEOUS) IMPLANT
SPECIMEN JAR LARGE (MISCELLANEOUS) ×4 IMPLANT
SPONGE ABDOMINAL VAC ABTHERA (MISCELLANEOUS) ×4 IMPLANT
SPONGE LAP 18X18 X RAY DECT (DISPOSABLE) ×8 IMPLANT
STAPLER PROXIMATE 75MM BLUE (STAPLE) ×4 IMPLANT
STAPLER VISISTAT 35W (STAPLE) ×4 IMPLANT
SUCTION POOLE TIP (SUCTIONS) ×4 IMPLANT
SUT NOVA 1 T20/GS 25DT (SUTURE) IMPLANT
SUT PDS AB 1 TP1 96 (SUTURE) ×8 IMPLANT
SUT SILK 2 0 SH CR/8 (SUTURE) ×4 IMPLANT
SUT SILK 2 0 TIES 10X30 (SUTURE) ×4 IMPLANT
SUT SILK 3 0 SH CR/8 (SUTURE) ×4 IMPLANT
SUT SILK 3 0 TIES 10X30 (SUTURE) ×4 IMPLANT
TOWEL OR 17X26 10 PK STRL BLUE (TOWEL DISPOSABLE) ×4 IMPLANT
TRAY FOLEY CATH 16FRSI W/METER (SET/KITS/TRAYS/PACK) IMPLANT
TUBE CONNECTING 12'X1/4 (SUCTIONS)
TUBE CONNECTING 12X1/4 (SUCTIONS) IMPLANT
YANKAUER SUCT BULB TIP NO VENT (SUCTIONS) IMPLANT

## 2014-01-18 NOTE — Progress Notes (Signed)
PULMONARY / CRITICAL CARE MEDICINE   Name: [name] [name] MRN: [id number] DOB: 02-Mar-1971    ADMISSION DATE:  01/10/2014  PRIMARY SERVICE: PCCM  CHIEF COMPLAINT:  SOB, leg pain.  BRIEF PATIENT DESCRIPTION:  43 years old male resident of an assisted living facility with PMH relevant for schizoaffective disorder, polysubstance abuse, seizures, COPD,  malnutrition, GERD. Presented with SOB and found to be hypotensive despite 2 L of IVF's. Started on dopamine via peripheral IV by ED and  PCCM called to admit. No clear source of infection.   SIGNIFICANT EVENTS / STUDIES:  3/29 Admitted to PCCM with septic shock and HCAP. Resuscitated with IVFs, vasopressors. Intubated shortly after admission. Bicarbonate infusion initiated for severe metabolic acidosis 8/87 Persistent septic shock. Worsening renal function. Acidosis improved. HCO3 stopped 3/31 Off vasopressors. Severe agitation @ times. Fentanyl and dex infusions initiated. Not able to F/C. Oliguric with worsening renal indices. Foley cath changed out without improvement in Uo. No indication for HD at this time 4/1 Close to extubation, urine output slightly improved, will need to contact wife for discussion of goals of care 4/2 Still agitated, self-extubated, re-intubated successfully. Continued worsening renal function. No HCPA or family/significant other contact. Will consult with ethics committee to resume responsibility for [facility] decisions.  4/3 Renal function no worse. Hepatic function improving.  4/4 Started phenylephrine  4/5 Continued hypotension and oliguria. Unequal pupils. Severe metabolic acidosis, started on bicarb drip and CRRT. Started levophed. Started stress-steroids. Panculture for source of infection (likely abdominal). 4/6  Slowly improving acidosis on CRRT.  4/7 Improving acidosis, CT head w/o acute abnormality 4/6 CT abdo/pelvis>>> 4/6 CT chest>>>  LINES / TUBES: L IJ CVL 3/29 >>  ETT 3/29 >> 4/2, 4/2 (self extubated and  re-intubated) >>   HD RIJ catheter 4/5 >> A-line 4/5 >>  CULTURES: MRSA PCR 3/29 >> POS Urine 3/29 >> NG Blood 3/28 >> NG Blood 3/29 >> NG Blood 4/5 >>NGTD Urine 4/5 >>NG Tracheal 3/31 >> Gram stain neg,  NG C diff 4/3 >> Neg C diff 4/5 >> Neg Gi Pathogen 4/5>> Neg Tracheal 4/5 >>abundant yeast on GS, NGTD  ANTIBIOTICS: IV Vanc 3/29 >> 4/2; 4/5 >> Pip-tazo 3/29 >>  IV flagyl 4/5 >> PO vanc 4/6 >> IV micafungin 4/5 >>  SUBJECTIVE:  RASS -3 to +3.  Pt not able to follow commands.  No acute events overnight.   VITAL SIGNS: Temp:  [95.1 F (35.1 C)-97.7 F (36.5 C)] 95.1 F (35.1 C) (04/07 0445) Pulse Rate:  [85-110] 102 (04/07 0615) Resp:  [0-30] 26 (04/07 0615) BP: (59-121)/(43-83) 87/51 mmHg (04/07 0615) SpO2:  [89 %-100 %] 92 % (04/07 0615) Arterial Line BP: (76-115)/(39-59) 96/50 mmHg (04/07 0615) FiO2 (%):  [40 %-50 %] 50 % (04/07 0600) Weight:  [72.5 kg (159 lb 13.3 oz)] 72.5 kg (159 lb 13.3 oz) (04/07 0400) HEMODYNAMICS: CVP:  [10 mmHg-71 mmHg] 10 mmHg VENTILATOR SETTINGS: Vent Mode:  [-] PRVC FiO2 (%):  [40 %-50 %] 50 % Set Rate:  [26 bmp-30 bmp] 26 bmp Vt Set:  [600 mL] 600 mL PEEP:  [5 cmH20] 5 cmH20 Plateau Pressure:  [24 cmH20-27 cmH20] 27 cmH20 INTAKE / OUTPUT: Intake/Output     04/06 0701 - 04/07 0700   I.V. (mL/kg) 5355.5 (73.9)   NG/GT 50   IV Piggyback 850   Total Intake(mL/kg) 6255.5 (86.3)   Urine (mL/kg/hr) 45 (0)   Other 5796 (3.3)   Total Output 5841   Net +414.5  PHYSICAL EXAMINATION: General: Looks critically ill, sedated  HEENT: WNL Cardiac: RRR  Lungs: Clear to ausculation in anterior fields   Abdomen: Distended, diminished BS, grimacing with palpation Ext: SCD's in place, cool, +3-4 edema up to abdomen GU: scrotal swelling Neuro: Unequal pupil  5 mm L>3 mmR RASS -3 to +3  PULMONARY  Recent Labs Lab 01/17/14 0529 01/17/14 1258 01/17/14 1646 01/17/14 2007 01/14/2014 0327  PHART 7.185* 7.269* 7.211* 7.287*  7.326*  PCO2ART 32.2* 38.4 26.7* 41.6 48.3*  PO2ART 70.0* 64.0* 74.0* 61.0* 66.6*  HCO3 12.5* 17.7* 10.8* 20.0 24.7*  TCO2 _0 26.2  O2SAT 92.0 90.0 92.0 89.0 94.8    CBC  Recent Labs Lab 01/17/14 0447 01/17/14 1030 01/14/2014 0430  HGB 8.6* 8.9* 7.5*  HCT 26.3* 26.5* 21.8*  WBC 50.6* 41.8* 41.0*  PLT 176 141* PLATELET CLUMPS NOTED ON SMEAR, COUNT APPEARS INCREASED    COAGULATION  Recent Labs Lab 01/12/14 0500 01/16/14 2030  INR 2.06* 2.50*     CHEMISTRY  Recent Labs Lab 01/15/14 0500 01/16/14 1340 01/16/14 2030 01/17/14 0447 01/17/14 1030 01/17/14 1600 02/04/2014 0430  NA 140 137 133* 137 137 139 136*  K 4.0 4.6 4.2 3.6* 3.9 3.4* 3.9  CL 99 95* 91* 92* 94* 102 97  CO2 17* 8* 9* 12* 16* 14* 24  GLUCOSE 109* 231* 198* 232* 213* 153* 124*  BUN 60* 58* 54* 34* 27* 21 14  CREATININE 4.81* 5.08* 4.98* 3.08* 2.31* 1.78* 1.20  CALCIUM 6.6* 6.9* 6.7* 6.8* 6.9* 5.4* 6.5*  MG 2.2 2.5 2.6* 2.2  --   --  2.0  PHOS 4.3  --  7.5* 3.8  --  2.3 1.5*   Estimated Creatinine Clearance: 81.4 ml/min (by C-G formula based on Cr of 1.2).   LIVER  Recent Labs Lab 01/12/14 0500  01/15/14 0500 01/16/14 1340 01/16/14 2030 01/17/14 0447 01/17/14 1030 01/17/14 1600 01/22/2014 0430  AST 801*  < > 76* 805* 852*  --  693*  --  351*  ALT 1367*  < > 392* 560* 578*  --  669*  --  495*  ALKPHOS 109  < > 124* 133* 143*  --  165*  --  138*  BILITOT 0.4  < > 0.6 0.7 0.9  --  1.6*  --  1.8*  PROT 4.4*  < > 4.9* 4.7* 4.8*  --  5.1*  --  4.3*  ALBUMIN 1.7*  < > 1.3* 1.2* 1.3* 1.4* 1.3* 1.0* 1.2*  INR 2.06*  --   --   --  2.50*  --   --   --   --   < > = values in this interval not displayed.   INFECTIOUS  Recent Labs Lab 01/15/14 1433 01/16/14 0500  01/17/14 0154 01/17/14 0447 01/17/14 0755 02/06/2014 0430  LATICACIDVEN  --   --   < > 11.5*  --  10.8* 5.9*  PROCALCITON 2.43 3.50  --   --  2.66  --   --   < > = values in this interval not displayed.   ENDOCRINE CBG  (last 3)   Recent Labs  01/17/14 1858 01/25/2014 0020 01/31/2014 0354  GLUCAP 149* 146* 123*     IMAGING x48h  Ct Head Wo Contrast  01/17/2014   CLINICAL DATA:  Patient in coma  EXAM: CT HEAD WITHOUT CONTRAST  TECHNIQUE: Contiguous axial images were obtained from the base of the skull through the vertex without intravenous contrast.  COMPARISON:  CT  TEMPORAL BONES W/O CM dated 10/29/2013; CT HEAD W/O CM dated 03/01/2013  FINDINGS: There is no evidence of mass effect, midline shift or extra-axial fluid collections. There is no evidence of a space-occupying lesion or intracranial hemorrhage. There is bilateral cerebellar encephalomalacia, right greater than left. There is no evidence of a cortical-based area of acute infarction.  The ventricles and sulci are appropriate for the patient's age. The basal cisterns are patent.  Visualized portions of the orbits are unremarkable. There is opacification of bilateral mastoid air cells unchanged from recent CT of the temporal bones.  There is a suboccipital craniectomy.  There is contrast in the left ethmoid sinus, left maxillary sinus, sphenoid sinus and nasopharynx likely reflecting reflux of oral contrast from within the stomach.  IMPRESSION: No acute intracranial pathology.  Contrast in the left ethmoid sinus, left maxillary sinus, sphenoid sinus and nasopharynx likely reflecting reflux of oral contrast from within the stomach.  Mastoiditis unchanged from 10/29/2013.   Electronically Signed   By: Kathreen Devoid   On: 01/17/2014 16:19   Dg Chest Port 1 View  01/17/2014   CLINICAL DATA:  Shortness of Breath  EXAM: PORTABLE CHEST - 1 VIEW  COMPARISON:  January 16, 2014  FINDINGS: Endotracheal tube tip is 4.9 cm above the carina. There is a central catheter in each jugular vein region, both with tips in the superior vena cava. Nasogastric tube tip and side port are in the stomach. No pneumothorax.  There is underlying emphysema. There is moderate interstitial edema. There  is is a loculated effusion on the right with a smaller effusion on the left. Compared to 1 day prior, there is less edema in the left mid lung region. The heart size is normal. The pulmonary vascularity reflects underlying emphysema. No appreciable adenopathy.  IMPRESSION: Tube and catheter positions as described without appreciable pneumothorax. Note that there is extensive bullous disease in the right upper lobe.  There is diffuse edema, less on the left compared to 1 day prior. There are bilateral pleural effusions, larger on the right than on the left. These findings overall are consistent with congestive heart failure superimposed on emphysematous change.   Electronically Signed   By: Lowella Grip M.D.   On: 01/17/2014 07:22   Dg Chest Port 1 View  01/16/2014   CLINICAL DATA:  Hemodialysis catheter placement. Bilateral pulmonary infiltrates.  EXAM: PORTABLE CHEST - 1 VIEW 5:36 p.m.  COMPARISON:  01/16/2014 5:29 a.m., 01/15/2014 and 01/14/2014  FINDINGS: Right jugular vein catheter has been inserted and the tip is in the superior vena cava. No pneumothorax. Left-sided jugular vein catheter tip is also in the superior vena cava. NG tube and endotracheal tube appear in good position. There is increasing infiltrate in the left lung, probably pulmonary edema. There is a moderate right and small left pleural effusion which are unchanged. There is pulmonary edema at the right lung base which is unchanged. Heart size and vascularity appear normal.  IMPRESSION: New central venous catheter is in good position. Progressive pulmonary edema on the left. No other change. Diffuse emphysematous changes most prominent in the right upper lobe.   Electronically Signed   By: Rozetta Nunnery M.D.   On: 01/16/2014 17:51   Dg Chest Port 1 View  01/16/2014   CLINICAL DATA:  Intubation, respiratory failure  EXAM: PORTABLE CHEST - 1 VIEW  COMPARISON:  01/15/2014  FINDINGS: Endotracheal tube is above the carina. Overall stable  support apparatus. Background COPD/ emphysema noted. Similar pattern of  diffuse airspace disease versus edema with pleural effusions, larger on the right. Mild cardiac enlargement. Basilar atelectasis also noted. Findings compatible with superimposed CHF, less likely pneumonia. No pneumothorax.  IMPRESSION: Background COPD/ emphysema with superimposed CHF.   Electronically Signed   By: Daryll Brod M.D.   On: 01/16/2014 08:59     ASSESSMENT / PLAN:  PULMONARY A: Ventilator Dependent Respiratory Failure - secondary to HCAP (?)  Pulmonary Edema with b/l effusions  - 2D- echo with no CHF  Uncompensated met acidosis COPD - emphysema R/o ARDS P:   Too high MV, no sbt Cont  TV 600, keep plat less 30, plat 27, officially add ards protocol, reduce to 7 cc/kg TV  - permissive hypercapnia Albuterol neb and duoneb PRN, limit when we can in ards F/U CXR Keep fio2, peep 5, 40% for now Consider earl trach  CARDIOVASCULAR A:  Septic shock R/o burn out adrenal P:  -Norepinephrine started 4/5 to maintain MAP > 60 mm Hg -limit vaso with concern ischemic bowel -repeat cortisol in am  -cbc in pm   RENAL A:   Acute kidney failure, oligo-anuric -  likely due to ATN (severe sepsis)     -granular casts on UA, still oliguric on CRRT, Cr normalizing Anion Gap Metabolic Acidosis with superimposed metabolic alkalosis   - slowly improving lactic acidosis  Hypokalemia  Hypocalcemia Hypophosphatemia P:   Renal following Continue CRRT Avoid nephrotoxins Monitor ABG Consider d/c bicarb gtt today Surgical consult, decompress   GASTROINTESTINAL A:   Diarrhea   -most likely due to tube feedings, c. Diff,  GI pathogen panel neg H/O GERD, chronic PPI use Acute liver failure due to severe sepsis    - Acute hepatitis panel neg Assess for abdominal compartment - 25   - pressure 20  Mild pancreatitis  - lipase now wnl  P:   F/U CT w/o cont abd, unsure why read not available, will call radiology  now Consider surgical consult, need CT result, but bl;adder pressure in current circumstance alone enough IV famotidine Tube feedings osmolite and pro-stat initiated 3/31-holding  HEMATOLOGIC A:   Normocytic Anemia     - likely due to severe sepsis  Mild thrombocytopenia - resolved Coagulapathy - elevated PT/PTT - worsening  - r/o dic, liver dz P:  SQ heparin DVT ppx- dc Monitor CBC in pm F/U Factor 8  SCD's  Vit K 10 mg Will ned ffp if surgery needed Type and cross coags in am   INFECTIOUS A:   Severe sepsis  (AKI, transaminitis, and encephalopathy)   - possible abdominal source, ischemic bowel vs colitis vs pancreatitis (lipase now wnl) Leukocytosis   RLL HCAP, NOS R/o cdiff     -Neg x 2  P:   Cont meropenem, IV vancomycin Dc IV flagyl   Cont IV micafungin  Follow lactic acid, improved  F/U CT w/o  abdomen and chest- calling now for result Dc cdiff treatment (PO vanc) until neg x 2 F/U repeat BC, RC, HIV  ENDOCRINE A:   NO Adrenal Insufficiency     - cortisol levels wnl Hypothyroidism     - TSH wnl Hyperglycemia  P:   Cont L-thyroxine  SSI with CBG Q 4 hr D50 as needed for CBG <70 Cortisol repeat  NEUROLOGIC/PSYCHOSOCIAL A:   Unequal Pupils - anisocoria at baseline?      - CT Head with no acute abnormality Acute encephalopathy with severe agitation Chronic b/l Cerebellar Encephalomalacia  Schizoaffective disorder Seizure disorder No HCPOA  identified P:   Fentanyl PRN pain  Con restraints  Hold Remeron, Zyprexa, Cogentin  IV phenytoin per pharmacy  Juluis Mire PGY1- IMTS  01/22/2014 6:33 AM  Ccm time 45 min   I have fully examined this patient and agree with above findings.    And edited i nf ull  Lavon Paganini. Titus Mould, MD, [place] Pgr: [facility]

## 2014-01-18 NOTE — Clinical Social Work Note (Addendum)
12:37pm- CSW Director recommending APS referral for guardianship.  Director also confirmed: MD to use professional judgement in acting in pt best interest of patient in pursuing medical tx.  CSW has left message for MD to provide update.  CSW has also provided update to [facility].  12:27pm CSW consulted with CSW Director in regards to decision making capabilities.  Awaiting a response.  Nonnie Done, [place] [phone]  Clinical Social Work

## 2014-01-18 NOTE — Progress Notes (Signed)
Pt. Taken to the OR emergently. CVVH disconnected. Ports instilled with 1.2 cc's of 1000 units/ml heparin. Blunt caps applied and STOP red stickers applied. CRNA notified at bedside that both would need to be aspirated of the heparin before use in the OR. Pt was hypotensive on levo and neo upon exit from the ICU

## 2014-01-18 NOTE — Op Note (Addendum)
OPERATIVE REPORT  DATE OF OPERATION:  01/31/2014  PATIENT:  [name] [name]  43 y.o. male  PRE-OPERATIVE DIAGNOSIS:  sepsis, acidic, possible dead bowel  POST-OPERATIVE DIAGNOSIS:  sepsis, acidic, dead small bowel  PROCEDURE:  Procedure(s): EXPLORATORY LAPAROTOMY SMALL BOWEL RESECTION APPLICATION OF WOUND VAC  SURGEON:  Surgeon(s): Gwenyth Ober, MD Zenovia Jarred, MD  ASSISTANT: Grandville Silos  ANESTHESIA:   general  EBL: <50 ml  BLOOD ADMINISTERED: 350 CC PRBC and 400 FFP  DRAINS: Negative pressure wound dressing.   SPECIMEN:  Source of Specimen:  Resected small bowel, distal jejunum and proximal ileum  COUNTS CORRECT:  YES  PROCEDURE DETAILS: The patient was taken to the operating room directly from the intensive care unit on the ventilator. He was strerilly prepped and draped in the usual manner exposing his entire abdomen.  After proper time out was performed identifying the patient and the procedure to be performed, a midline incision was made from just below the xiphoid down to below the umbilicus by approximately 5 cm. The subcutaneous tissue was markedly edematous from the anasarca that the patient was known to have. We went down to and through the midline fascia and entered the peritoneal cavity where there was a fair amount to moderate amount of ascitic fluid. There was no foul smell. There is no evidence of enteric contents.  As we explored the small bowel from the ligament of Treitz down to the terminal ileum, and  there was a segment of small bowel in the distal jejunum and proximal ileum, approximately 2 feet long, that had multiple complete areas of infarction full-thickness necrosis. This area was resected between GIA 75 staplers. An anastomosis was not performed between the remaining pieces as this will be done in the future operation. We took the mesentery using a LigaSure device. Once this was done we irrigated with approximately 3 L of warm saline solution.  The  abdomen was then closed using a negative pressure wound dressing, the abdominal AB Thera device. There was excellent suction and closure. All counts were correct. The patient was then taken back to the intensive care unit in critical condition.  PATIENT DISPOSITION:  ICU - intubated and critically ill.   Gwenyth Ober 4/7/20154:25 PM

## 2014-01-18 NOTE — Progress Notes (Signed)
Late entry: Called Dr. Rosendo Gros regarding the large amount of blood draining from the wound vac and the OG tube. HGB is 10.3, Dr. Rosendo Gros ordered another CBC for 2100 to follow the trend for follow up. He is aware of the bleeding from the OG and the wound vac.

## 2014-01-18 NOTE — Progress Notes (Signed)
MEDICATION RELATED CONSULT NOTE - FOLLOW UP   Pharmacy Consult for Phenytoin Indication: Hx seizures  Allergies  Allergen Reactions  . Fexofenadine Hcl Other (See Comments)    unknown  . Percocet [Oxycodone-Acetaminophen] Other (See Comments)    unknown  . Thorazine [Chlorpromazine Hcl] Other (See Comments)    unknown    Patient Measurements: Height: 6\' 2"  (188 cm) Weight: 159 lb 13.3 oz (72.5 kg) IBW/kg (Calculated) : 82.2 Vital Signs: Temp: 97.3 F (36.3 C) (04/07 0841) Temp src: Oral (04/07 0841) BP: 76/49 mmHg (04/07 0957) Pulse Rate: 111 (04/07 0957) Intake/Output from previous day: 04/06 0701 - 04/07 0700 In: 6295.5 [I.V.:5395.5; NG/GT:50; IV Piggyback:850] Out: 6079 [Urine:45] Intake/Output from this shift: Total I/O In: 206 [I.V.:120; IV Piggyback:86] Out: 438 [Other:438]  Labs:  Recent Labs  01/16/14 2030 01/17/14 0447 01/17/14 1030 01/17/14 1600 01/27/2014 0430  WBC  --  50.6* 41.8*  --  41.0*  HGB  --  8.6* 8.9*  --  7.5*  HCT  --  26.3* 26.5*  --  21.8*  PLT  --  176 141*  --  PLATELET CLUMPS NOTED ON SMEAR, COUNT APPEARS INCREASED  APTT  --  129*  --   --  194*  CREATININE 4.98* 3.08* 2.31* 1.78* 1.20  MG 2.6* 2.2  --   --  2.0  PHOS 7.5* 3.8  --  2.3 1.5*  ALBUMIN 1.3* 1.4* 1.3* 1.0* 1.2*  PROT 4.8*  --  5.1*  --  4.3*  AST 852*  --  693*  --  351*  ALT 578*  --  669*  --  495*  ALKPHOS 143*  --  165*  --  138*  BILITOT 0.9  --  1.6*  --  1.8*  BILIDIR  --   --  1.3*  --   --   IBILI  --   --  0.3  --   --    Estimated Creatinine Clearance: 81.4 ml/min (by C-G formula based on Cr of 1.2).   Microbiology: Recent Results (from the past 720 hour(s))  CULTURE, BLOOD (ROUTINE X 2)     Status: None   Collection Time    02-Feb-2014 11:58 PM      Result Value Ref Range Status   Specimen Description BLOOD WRIST RIGHT   Final   Special Requests BOTTLES DRAWN AEROBIC AND ANAEROBIC 5CC EA   Final   Culture  Setup Time     Final   Value: 01/09/2014  03:47     Performed at [facility]   Culture     Final   Value: NO GROWTH 5 DAYS     Performed at [facility]   Report Status 01/15/2014 FINAL   Final  CULTURE, BLOOD (ROUTINE X 2)     Status: None   Collection Time    01/09/14 12:24 AM      Result Value Ref Range Status   Specimen Description BLOOD LEFT ARM   Final   Special Requests BOTTLES DRAWN AEROBIC ONLY 10CC   Final   Culture  Setup Time     Final   Value: 01/09/2014 03:47     Performed at [facility]   Culture     Final   Value: NO GROWTH 5 DAYS     Performed at [facility]   Report Status 01/15/2014 FINAL   Final  URINE CULTURE     Status: None   Collection Time    01/09/14  2:42 AM      Result Value Ref Range Status   Specimen Description URINE, CATHETERIZED   Final   Special Requests ADD [id number] [phone]   Final   Culture  Setup Time     Final   Value: 01/09/2014 14:10     Performed at [facility]     Final   Value: NO GROWTH     Performed at [facility]   Culture     Final   Value: NO GROWTH     Performed at [facility]   Report Status 01/10/2014 FINAL   Final  MRSA PCR SCREENING     Status: Abnormal   Collection Time    01/09/14  4:50 AM      Result Value Ref Range Status   MRSA by PCR POSITIVE (*) NEGATIVE Final   Comment:            The GeneXpert MRSA Assay (FDA     approved for NASAL specimens     only), is one component of a     comprehensive MRSA colonization     surveillance program. It is not     intended to diagnose MRSA     infection nor to guide or     monitor treatment for     MRSA infections.     RESULT CALLED TO, READ BACK BY AND VERIFIED WITH:     MACK,B RN [phone] 01/09/14 MITCHELL,L  CULTURE, RESPIRATORY (NON-EXPECTORATED)     Status: None   Collection Time    01/11/14 11:53 AM      Result Value Ref Range Status   Specimen Description TRACHEAL ASPIRATE   Final   Special Requests NONE   Final   Gram Stain      Final   Value: NO WBC SEEN     NO SQUAMOUS EPITHELIAL CELLS SEEN     NO ORGANISMS SEEN     Performed at [facility]   Culture     Final   Value: NO GROWTH 2 DAYS     Performed at [facility]   Report Status 01/13/2014 FINAL   Final  CLOSTRIDIUM DIFFICILE BY PCR     Status: None   Collection Time    01/14/14  2:17 PM      Result Value Ref Range Status   C difficile by pcr NEGATIVE  NEGATIVE Final  CULTURE, BLOOD (ROUTINE X 2)     Status: None   Collection Time    01/16/14  8:53 PM      Result Value Ref Range Status   Specimen Description BLOOD RIGHT HAND   Final   Special Requests BOTTLES DRAWN AEROBIC ONLY 5CC   Final   Culture  Setup Time     Final   Value: 01/17/2014 02:22     Performed at [facility]   Culture     Final   Value:        BLOOD CULTURE RECEIVED NO GROWTH TO DATE CULTURE WILL BE HELD FOR 5 DAYS BEFORE ISSUING A FINAL NEGATIVE REPORT     Performed at [facility]   Report Status PENDING   Incomplete  CULTURE, BLOOD (ROUTINE X 2)     Status: None   Collection Time    01/16/14  9:00 PM      Result Value Ref Range Status   Specimen Description BLOOD RIGHT HAND   Final   Special Requests  BOTTLES DRAWN AEROBIC ONLY 3CC   Final   Culture  Setup Time     Final   Value: 01/17/2014 02:22     Performed at [facility]   Culture     Final   Value:        BLOOD CULTURE RECEIVED NO GROWTH TO DATE CULTURE WILL BE HELD FOR 5 DAYS BEFORE ISSUING A FINAL NEGATIVE REPORT     Performed at [facility]   Report Status PENDING   Incomplete  URINE CULTURE     Status: None   Collection Time    01/16/14  9:50 PM      Result Value Ref Range Status   Specimen Description URINE, RANDOM   Final   Special Requests NONE   Final   Culture  Setup Time     Final   Value: 01/16/2014 22:19     Performed at [facility] Count     Final   Value: NO GROWTH     Performed at [facility]   Culture     Final    Value: NO GROWTH     Performed at [facility]   Report Status 01/17/2014 FINAL   Final  CLOSTRIDIUM DIFFICILE BY PCR     Status: None   Collection Time    01/16/14  9:57 PM      Result Value Ref Range Status   C difficile by pcr NEGATIVE  NEGATIVE Final  CULTURE, RESPIRATORY (NON-EXPECTORATED)     Status: None   Collection Time    01/16/14  9:59 PM      Result Value Ref Range Status   Specimen Description TRACHEAL ASPIRATE   Final   Special Requests NONE   Final   Gram Stain     Final   Value: FEW WBC PRESENT,BOTH PMN AND MONONUCLEAR     NO SQUAMOUS EPITHELIAL CELLS SEEN     ABUNDANT YEAST     Performed at [facility]   Culture     Final   Value: Culture reincubated for better growth     Performed at [facility]   Report Status PENDING   Incomplete    Assessment: Patient on phenytoin PTA for hx of seizures. Patient with AKI and shocked liver currently on CRRT. No current seizure activity.   DPH 300 QHS PTA (~4.7mg /kg/d) >> 100mg  IV Q8H on 3/30 >> holding since 4/1.  3/29 level 7.9 >> corr 12.7 4/1 level 12.8 >> corr 28.87 (no reported nystagmus)  4/2 level 9.3 >> corr 21.1 4/3 level 8.5>> corr 21.2  4/5 level 5.3>> corr 15.5 with new alb 1.2 >> currently clearing 3mg /hr 4/7 level 5.3>> corr 15.5 (doesn't seem to be clearing)  Goal of Therapy:  Phenytoin goal of 10-20 ug/ml  Plan:   - Hold phenytoin again today since not clearing well - F/u phenytoin level in am    Harolyn Rutherford, PharmD Clinical Pharmacist - Resident Pager: [phone] Pharmacy: [phone] 02/01/2014 10:07 AM

## 2014-01-18 NOTE — Anesthesia Preprocedure Evaluation (Signed)
Anesthesia Evaluation  Patient identified by MRN, date of birth, ID band Patient unresponsive    Reviewed: Allergy & Precautions, H&P , Patient's Chart, lab work & pertinent test results, Unable to perform ROS - Chart review only  Airway       Dental   Pulmonary shortness of breath, asthma , COPDCurrent Smoker,  + rhonchi         Cardiovascular Rhythm:Regular Rate:Tachycardia     Neuro/Psych Seizures -,  Anxiety Schizophrenia    GI/Hepatic (+)     substance abuse   ,   Endo/Other  Hypothyroidism   Renal/GU      Musculoskeletal   Abdominal   Peds  Hematology  (+) anemia ,   Anesthesia Other Findings Septic shock Hypotensive Intubated/ventilated  Reproductive/Obstetrics                           Anesthesia Physical Anesthesia Plan  ASA: IV and emergent  Anesthesia Plan: General   Post-op Pain Management:    Induction:   Airway Management Planned:   Additional Equipment:   Intra-op Plan:   Post-operative Plan: Post-operative intubation/ventilation  Informed Consent: I have reviewed the patients History and Physical, chart, labs and discussed the procedure including the risks, benefits and alternatives for the proposed anesthesia with the patient or authorized representative who has indicated his/her understanding and acceptance.     Plan Discussed with: CRNA and Surgeon  Anesthesia Plan Comments:         Anesthesia Quick Evaluation

## 2014-01-18 NOTE — Progress Notes (Signed)
Subjective: Interval History: has complaints , aggitates and purposeful when touch abdm.  On vent not coop.  Objective: Vital signs in last 24 hours: Temp:  [95.1 F (35.1 C)-97.7 F (36.5 C)] 96 F (35.6 C) (04/07 0600) Pulse Rate:  [85-110] 104 (04/07 0645) Resp:  [0-30] 26 (04/07 0645) BP: (59-121)/(43-83) 84/53 mmHg (04/07 0645) SpO2:  [89 %-100 %] 93 % (04/07 0645) Arterial Line BP: (76-115)/(39-59) 88/48 mmHg (04/07 0645) FiO2 (%):  [40 %-50 %] 50 % (04/07 0600) Weight:  [72.5 kg (159 lb 13.3 oz)] 72.5 kg (159 lb 13.3 oz) (04/07 0400) Weight change: 1.5 kg (3 lb 4.9 oz)  Intake/Output from previous day: 04/06 0701 - 04/07 0700 In: 6255.5 [I.V.:5355.5; NG/GT:50; IV Piggyback:850] Out: 6079 [Urine:45] Intake/Output this shift:    General appearance: pale, uncooperative and as above, moves to stim Resp: rhonchi bilaterally Cardio: S1, S2 normal and systolic murmur: holosystolic 2/6, blowing at apex GI: severe tender, bruising, rigid, no bs Extremities: edema 3+.  and cool  Lab Results:  Recent Labs  01/17/14 1030 01/16/2014 0430  WBC 41.8* 41.0*  HGB 8.9* 7.5*  HCT 26.5* 21.8*  PLT 141* PLATELET CLUMPS NOTED ON SMEAR, COUNT APPEARS INCREASED   BMET:  Recent Labs  01/17/14 1600 01/16/2014 0430  NA 139 136*  K 3.4* 3.9  CL 102 97  CO2 14* 24  GLUCOSE 153* 124*  BUN 21 14  CREATININE 1.78* 1.20  CALCIUM 5.4* 6.5*   No results found for this basename: PTH,  in the last 72 hours Iron Studies: No results found for this basename: IRON, TIBC, TRANSFERRIN, FERRITIN,  in the last 72 hours  Studies/Results: Ct Head Wo Contrast  01/17/2014   CLINICAL DATA:  Patient in coma  EXAM: CT HEAD WITHOUT CONTRAST  TECHNIQUE: Contiguous axial images were obtained from the base of the skull through the vertex without intravenous contrast.  COMPARISON:  CT TEMPORAL BONES W/O CM dated 10/29/2013; CT HEAD W/O CM dated 03/01/2013  FINDINGS: There is no evidence of mass effect, midline  shift or extra-axial fluid collections. There is no evidence of a space-occupying lesion or intracranial hemorrhage. There is bilateral cerebellar encephalomalacia, right greater than left. There is no evidence of a cortical-based area of acute infarction.  The ventricles and sulci are appropriate for the patient's age. The basal cisterns are patent.  Visualized portions of the orbits are unremarkable. There is opacification of bilateral mastoid air cells unchanged from recent CT of the temporal bones.  There is a suboccipital craniectomy.  There is contrast in the left ethmoid sinus, left maxillary sinus, sphenoid sinus and nasopharynx likely reflecting reflux of oral contrast from within the stomach.  IMPRESSION: No acute intracranial pathology.  Contrast in the left ethmoid sinus, left maxillary sinus, sphenoid sinus and nasopharynx likely reflecting reflux of oral contrast from within the stomach.  Mastoiditis unchanged from 10/29/2013.   Electronically Signed   By: Kathreen Devoid   On: 01/17/2014 16:19   Dg Chest Port 1 View  01/17/2014   CLINICAL DATA:  Shortness of Breath  EXAM: PORTABLE CHEST - 1 VIEW  COMPARISON:  January 16, 2014  FINDINGS: Endotracheal tube tip is 4.9 cm above the carina. There is a central catheter in each jugular vein region, both with tips in the superior vena cava. Nasogastric tube tip and side port are in the stomach. No pneumothorax.  There is underlying emphysema. There is moderate interstitial edema. There is is a loculated effusion on the right with  a smaller effusion on the left. Compared to 1 day prior, there is less edema in the left mid lung region. The heart size is normal. The pulmonary vascularity reflects underlying emphysema. No appreciable adenopathy.  IMPRESSION: Tube and catheter positions as described without appreciable pneumothorax. Note that there is extensive bullous disease in the right upper lobe.  There is diffuse edema, less on the left compared to 1 day prior.  There are bilateral pleural effusions, larger on the right than on the left. These findings overall are consistent with congestive heart failure superimposed on emphysematous change.   Electronically Signed   By: Lowella Grip M.D.   On: 01/17/2014 07:22   Dg Chest Port 1 View  01/16/2014   CLINICAL DATA:  Hemodialysis catheter placement. Bilateral pulmonary infiltrates.  EXAM: PORTABLE CHEST - 1 VIEW 5:36 p.m.  COMPARISON:  01/16/2014 5:29 a.m., 01/15/2014 and 01/14/2014  FINDINGS: Right jugular vein catheter has been inserted and the tip is in the superior vena cava. No pneumothorax. Left-sided jugular vein catheter tip is also in the superior vena cava. NG tube and endotracheal tube appear in good position. There is increasing infiltrate in the left lung, probably pulmonary edema. There is a moderate right and small left pleural effusion which are unchanged. There is pulmonary edema at the right lung base which is unchanged. Heart size and vascularity appear normal.  IMPRESSION: New central venous catheter is in good position. Progressive pulmonary edema on the left. No other change. Diffuse emphysematous changes most prominent in the right upper lobe.   Electronically Signed   By: Rozetta Nunnery M.D.   On: 01/16/2014 17:51    I have reviewed the patient's current medications.  Assessment/Plan: 1 AKI better clearance with solute and acid/base.  K ok. Phos low, replete.  Vol xs but bp limits removal. O2 ok and CXR with some haziness.   2 Septic syndrome abdm source.  Suspect colon, but has some pancreas inflam.  ? Ischemic vs infx colitis.   3 Schock. On Levo 4 VDRF 5 Nutrit none at this time. 6 glu follow closely 7 schizo affective 8 social seems not clear P CRRT same regimen, d/c bicarb, follow coags, vent , ab, follow glu      LOS: 10 days   Dimetrius Montfort L February 09, 2014,7:25 AM

## 2014-01-18 NOTE — Progress Notes (Signed)
[facility] resumed at 1830 after return from OR. Aspirated 5 cc of blood from both the access and return port prior to initiating treatment to assure no heparin was given.Resumed at previous settings with net negative of 0

## 2014-01-18 NOTE — Anesthesia Postprocedure Evaluation (Signed)
  Anesthesia Post-op Note  Patient: [name] [name]  Procedure(s) Performed: Procedure(s): EXPLORATORY LAPAROTOMY (N/A) SMALL BOWEL RESECTION (N/A) APPLICATION OF WOUND VAC (N/A)  Patient Location: ICU  Anesthesia Type:General  Level of Consciousness: Patient remains intubated per anesthesia plan  Airway and Oxygen Therapy: Patient remains intubated per anesthesia plan  Post-op Pain: none  Post-op Assessment: Patient's Cardiovascular Status Stable  Post-op Vital Signs: stable  Complications: No apparent anesthesia complications

## 2014-01-18 NOTE — Consult Note (Signed)
Reason for Consult:Hypotension and possible dead bowel Referring Physician: Tryone Kille is an 43 y.o. male.  HPI: Patient admitted to critical care medicine service with severe sepsis and hypotension, has been on high doses of pressors since admission, today the attending has concerns about dead bowel because, in spite of the patient being on high doses of fentanyl, he has significant tenderness on examination and IAP pressures have been about 23-25  He is homeless, schizophrenic, and has no family around.  He has a history of polysubstance abuse.    Past Medical History  Diagnosis Date  . Chronic pain   . Anxiety   . Schizoaffective disorder   . Hearing loss   . Seizure   . Arthritis   . Asthma   . Foot drop   . Anemia   . Dehydration   . Hypotension   . GERD (gastroesophageal reflux disease)   . Hypothyroidism   . Constipation   . Polysubstance abuse   . Cancer   . Leukocytosis     History reviewed. No pertinent past surgical history.  History reviewed. No pertinent family history.  Social History:  reports that he has been smoking Cigarettes.  He has a 40.5 pack-year smoking history. He has never used smokeless tobacco. He reports that he does not drink alcohol or use illicit drugs.  Allergies:  Allergies  Allergen Reactions  . Fexofenadine Hcl Other (See Comments)    unknown  . Percocet [Oxycodone-Acetaminophen] Other (See Comments)    unknown  . Thorazine [Chlorpromazine Hcl] Other (See Comments)    unknown    Medications: I have reviewed the patient's current medications.  Results for orders placed during the hospital encounter of 12/19/2013 (from the past 48 hour(s))  GLUCOSE, CAPILLARY     Status: Abnormal   Collection Time    01/16/14  3:43 PM      Result Value Ref Range   Glucose-Capillary 111 (*) 70 - 99 mg/dL  POCT I-STAT 3, BLOOD GAS (G3+)     Status: Abnormal   Collection Time    01/16/14  4:01 PM      Result Value Ref Range   pH,  Arterial 6.864 (*) 7.350 - 7.450   pCO2 arterial 42.0  35.0 - 45.0 mmHg   pO2, Arterial 40.0 (*) 80.0 - 100.0 mmHg   Bicarbonate 7.7 (*) 20.0 - 24.0 mEq/L   TCO2 9  0 - 100 mmol/L   O2 Saturation 43.0     Acid-base deficit 25.0 (*) 0.0 - 2.0 mmol/L   Patient temperature 97.6 F     Collection site RADIAL, ALLEN'S TEST ACCEPTABLE     Drawn by RT     Sample type ARTERIAL     Comment NOTIFIED PHYSICIAN    LACTIC ACID, PLASMA     Status: Abnormal   Collection Time    01/16/14  4:23 PM      Result Value Ref Range   Lactic Acid, Venous 11.6 (*) 0.5 - 2.2 mmol/L  CBC     Status: Abnormal   Collection Time    01/16/14  5:44 PM      Result Value Ref Range   WBC 61.1 (*) 4.0 - 10.5 K/uL   Comment: REPEATED TO VERIFY     WHITE COUNT CONFIRMED ON SMEAR     CRITICAL RESULT CALLED TO, READ BACK BY AND VERIFIED WITH:     MACASERA,S RN 01/16/14 1901 WOOTEN,K   RBC 2.55 (*) 4.22 - 5.81 MIL/uL  Hemoglobin 7.9 (*) 13.0 - 17.0 g/dL   HCT 24.6 (*) 39.0 - 52.0 %   MCV 96.5  78.0 - 100.0 fL   MCH 31.0  26.0 - 34.0 pg   MCHC 32.1  30.0 - 36.0 g/dL   RDW 17.1 (*) 11.5 - 15.5 %   Platelets 206  150 - 400 K/uL   Comment: PLATELET COUNT CONFIRMED BY SMEAR     SPECIMEN CHECKED FOR CLOTS  CK     Status: Abnormal   Collection Time    01/16/14  6:10 PM      Result Value Ref Range   Total CK 348 (*) 7 - 232 U/L  LIPASE, BLOOD     Status: Abnormal   Collection Time    01/16/14  6:10 PM      Result Value Ref Range   Lipase 457 (*) 11 - 59 U/L  AMYLASE     Status: Abnormal   Collection Time    01/16/14  6:10 PM      Result Value Ref Range   Amylase 149 (*) 0 - 105 U/L  VANCOMYCIN, RANDOM     Status: None   Collection Time    01/16/14  7:30 PM      Result Value Ref Range   Vancomycin Rm 24.1     Comment:            Random Vancomycin therapeutic     range is dependent on dosage and     time of specimen collection.     A peak range is 20.0-40.0 ug/mL     A trough range is 5.0-15.0 ug/mL              GLUCOSE, CAPILLARY     Status: Abnormal   Collection Time    01/16/14  7:44 PM      Result Value Ref Range   Glucose-Capillary 150 (*) 70 - 99 mg/dL   Comment 1 Notify RN    POCT I-STAT 3, BLOOD GAS (G3+)     Status: Abnormal   Collection Time    01/16/14  8:19 PM      Result Value Ref Range   pH, Arterial 6.858 (*) 7.350 - 7.450   pCO2 arterial 47.4 (*) 35.0 - 45.0 mmHg   pO2, Arterial 50.0 (*) 80.0 - 100.0 mmHg   Bicarbonate 8.5 (*) 20.0 - 24.0 mEq/L   TCO2 10  0 - 100 mmol/L   O2 Saturation 58.0     Acid-base deficit 24.0 (*) 0.0 - 2.0 mmol/L   Patient temperature 97.5 F     Collection site RADIAL, ALLEN'S TEST ACCEPTABLE     Drawn by RT     Sample type ARTERIAL     Comment NOTIFIED PHYSICIAN    COMPREHENSIVE METABOLIC PANEL     Status: Abnormal   Collection Time    01/16/14  8:30 PM      Result Value Ref Range   Sodium 133 (*) 137 - 147 mEq/L   Potassium 4.2  3.7 - 5.3 mEq/L   Chloride 91 (*) 96 - 112 mEq/L   CO2 9 (*) 19 - 32 mEq/L   Comment: CRITICAL RESULT CALLED TO, READ BACK BY AND VERIFIED WITH:     HAYES C,RN 01/16/14 2114 WAYK   Glucose, Bld 198 (*) 70 - 99 mg/dL   BUN 54 (*) 6 - 23 mg/dL   Creatinine, Ser 4.98 (*) 0.50 - 1.35 mg/dL   Calcium 6.7 (*) 8.4 - 10.5  mg/dL   Total Protein 4.8 (*) 6.0 - 8.3 g/dL   Albumin 1.3 (*) 3.5 - 5.2 g/dL   AST 852 (*) 0 - 37 U/L   ALT 578 (*) 0 - 53 U/L   Alkaline Phosphatase 143 (*) 39 - 117 U/L   Total Bilirubin 0.9  0.3 - 1.2 mg/dL   GFR calc non Af Amer 13 (*) >90 mL/min   GFR calc Af Amer 15 (*) >90 mL/min   Comment: (NOTE)     The eGFR has been calculated using the CKD EPI equation.     This calculation has not been validated in all clinical situations.     eGFR's persistently <90 mL/min signify possible Chronic Kidney     Disease.  MAGNESIUM     Status: Abnormal   Collection Time    01/16/14  8:30 PM      Result Value Ref Range   Magnesium 2.6 (*) 1.5 - 2.5 mg/dL  PHOSPHORUS     Status: Abnormal    Collection Time    01/16/14  8:30 PM      Result Value Ref Range   Phosphorus 7.5 (*) 2.3 - 4.6 mg/dL  TROPONIN I     Status: Abnormal   Collection Time    01/16/14  8:30 PM      Result Value Ref Range   Troponin I 0.31 (*) <0.30 ng/mL   Comment:            Due to the release kinetics of cTnI,     a negative result within the first hours     of the onset of symptoms does not rule out     myocardial infarction with certainty.     If myocardial infarction is still suspected,     repeat the test at appropriate intervals.     CRITICAL RESULT CALLED TO, READ BACK BY AND VERIFIED WITH:     HAYES C,RN 01/16/14 2116 WAYK  PROTIME-INR     Status: Abnormal   Collection Time    01/16/14  8:30 PM      Result Value Ref Range   Prothrombin Time 26.2 (*) 11.6 - 15.2 seconds   INR 2.50 (*) 0.00 - 1.49  CORTISOL     Status: None   Collection Time    01/16/14  8:53 PM      Result Value Ref Range   Cortisol, Plasma 124.8     Comment: (NOTE)     Result repeated and verified.     AM:  4.3 - 22.4 ug/dL     PM:  3.1 - 16.7 ug/dL     Performed at [facility], BLOOD (ROUTINE X 2)     Status: None   Collection Time    01/16/14  8:53 PM      Result Value Ref Range   Specimen Description BLOOD RIGHT HAND     Special Requests BOTTLES DRAWN AEROBIC ONLY 5CC     Culture  Setup Time       Value: 01/17/2014 02:22     Performed at [facility]   Culture       Value:        BLOOD CULTURE RECEIVED NO GROWTH TO DATE CULTURE WILL BE HELD FOR 5 DAYS BEFORE ISSUING A FINAL NEGATIVE REPORT     Performed at [facility]   Report Status PENDING    CULTURE, BLOOD (ROUTINE X 2)     Status: None  Collection Time    01/16/14  9:00 PM      Result Value Ref Range   Specimen Description BLOOD RIGHT HAND     Special Requests BOTTLES DRAWN AEROBIC ONLY 3CC     Culture  Setup Time       Value: 01/17/2014 02:22     Performed at [facility]   Culture       Value:         BLOOD CULTURE RECEIVED NO GROWTH TO DATE CULTURE WILL BE HELD FOR 5 DAYS BEFORE ISSUING A FINAL NEGATIVE REPORT     Performed at [facility]   Report Status PENDING    POCT I-STAT 3, BLOOD GAS (G3+)     Status: Abnormal   Collection Time    01/16/14  9:35 PM      Result Value Ref Range   pH, Arterial 6.987 (*) 7.350 - 7.450   pCO2 arterial 44.0  35.0 - 45.0 mmHg   pO2, Arterial 388.0 (*) 80.0 - 100.0 mmHg   Bicarbonate 10.6 (*) 20.0 - 24.0 mEq/L   TCO2 12  0 - 100 mmol/L   O2 Saturation 100.0     Acid-base deficit 20.0 (*) 0.0 - 2.0 mmol/L   Patient temperature 97.5 F     Collection site ARTERIAL LINE     Drawn by RT     Sample type ARTERIAL    URINE CULTURE     Status: None   Collection Time    01/16/14  9:50 PM      Result Value Ref Range   Specimen Description URINE, RANDOM     Special Requests NONE     Culture  Setup Time       Value: 01/16/2014 22:19     Performed at [facility]       Value: NO GROWTH     Performed at [facility]   Culture       Value: NO GROWTH     Performed at [facility]   Report Status 01/17/2014 FINAL    CLOSTRIDIUM DIFFICILE BY PCR     Status: None   Collection Time    01/16/14  9:57 PM      Result Value Ref Range   C difficile by pcr NEGATIVE  NEGATIVE  GI PATHOGEN PANEL BY PCR, STOOL     Status: None   Collection Time    01/16/14  9:58 PM      Result Value Ref Range   Campylobacter by PCR Negative     C difficile toxin A/B Negative     E coli 0157 by PCR Negative     E coli (ETEC) LT/ST Negative     E coli (STEC) Negative     Salmonella by PCR Negative     Shigella by PCR Negative     Norovirus G!/G2 Negative     Rotavirus A by PCR Negative     G lamblia by PCR Negative     Cryptosporidium by PCR Negative     Comment: (NOTE)      ** Normal Reference Range for each Analyte: Not Detected **           The detection and identification of specific gastrointestinal     microbial nucleic  acid from individuals exhibiting signs and     symptoms of gastrointestinal infection aids in the diagnosis     of gastrointestinal infection when used in conjunction  with     clinical evaluation, laboratory findings, and epidemiological     information.     ** The xTAG Gastrointestinal Pathogen Panel results are presumptive     and must be confirmed by FDA-cleared tests or other acceptable     reference methods.  The results of this test should not be used as     the sole basis for diagnosis, treatment, or other patient management     decisions.     Performed using the [employer] xTAG Gastrointestinal Pathogen Panel test     kit.     Performed at [facility], RESPIRATORY (NON-EXPECTORATED)     Status: None   Collection Time    01/16/14  9:59 PM      Result Value Ref Range   Specimen Description TRACHEAL ASPIRATE     Special Requests NONE     Gram Stain       Value: FEW WBC PRESENT,BOTH PMN AND MONONUCLEAR     NO SQUAMOUS EPITHELIAL CELLS SEEN     ABUNDANT YEAST     Performed at [facility]   Culture       Value: MODERATE CANDIDA ALBICANS     Performed at [facility]   Report Status 01/22/2014 FINAL    POCT ACTIVATED CLOTTING TIME     Status: None   Collection Time    01/16/14 10:49 PM      Result Value Ref Range   Activated Clotting Time 221    GLUCOSE, CAPILLARY     Status: Abnormal   Collection Time    01/16/14 11:56 PM      Result Value Ref Range   Glucose-Capillary 203 (*) 70 - 99 mg/dL   Comment 1 Notify RN    TROPONIN I     Status: None   Collection Time    01/17/14  1:53 AM      Result Value Ref Range   Troponin I <0.30  <0.30 ng/mL   Comment:            Due to the release kinetics of cTnI,     a negative result within the first hours     of the onset of symptoms does not rule out     myocardial infarction with certainty.     If myocardial infarction is still suspected,     repeat the test at appropriate intervals.  LACTIC  ACID, PLASMA     Status: Abnormal   Collection Time    01/17/14  1:54 AM      Result Value Ref Range   Lactic Acid, Venous 11.5 (*) 0.5 - 2.2 mmol/L  GLUCOSE, CAPILLARY     Status: Abnormal   Collection Time    01/17/14  4:10 AM      Result Value Ref Range   Glucose-Capillary 218 (*) 70 - 99 mg/dL  CBC WITH DIFFERENTIAL     Status: Abnormal   Collection Time    01/17/14  4:47 AM      Result Value Ref Range   WBC 50.6 (*) 4.0 - 10.5 K/uL   Comment: REPEATED TO VERIFY     CRITICAL VALUE NOTED.  VALUE IS CONSISTENT WITH PREVIOUSLY REPORTED AND CALLED VALUE.   RBC 2.89 (*) 4.22 - 5.81 MIL/uL   Hemoglobin 8.6 (*) 13.0 - 17.0 g/dL   HCT 26.3 (*) 39.0 - 52.0 %   MCV 91.0  78.0 - 100.0 fL   MCH 29.8  26.0 -  34.0 pg   MCHC 32.7  30.0 - 36.0 g/dL   RDW 16.8 (*) 11.5 - 15.5 %   Platelets 176  150 - 400 K/uL   Comment: REPEATED TO VERIFY     PLATELET COUNT CONFIRMED BY SMEAR   Neutro Abs 46.4 (*) 1.7 - 7.7 K/uL   Lymphs Abs 3.5  0.7 - 4.0 K/uL   Monocytes Absolute 0.5  0.1 - 1.0 K/uL   Eosinophils Absolute 0.5  0.0 - 0.7 K/uL   Basophils Absolute 0  0.0 - 0.1 K/uL   Neutrophils Relative % 91 (*) 43 - 77 %   Lymphocytes Relative 7 (*) 12 - 46 %   Monocytes Relative 1 (*) 3 - 12 %   Eosinophils Relative 1  0 - 5 %   Basophils Relative 0  0 - 1 %   Band Neutrophils 0  0 - 10 %   Metamyelocytes Relative 0     Myelocytes 0     Promyelocytes Absolute 0     Blasts 0     nRBC 0  0 /100 WBC   RBC Morphology RARE NRBCs     Comment: BURR CELLS   WBC Morphology INCREASED BANDS (>20% BANDS)     Comment: MODERATE LEFT SHIFT (>5% METAS AND MYELOS,OCC PRO NOTED)  MAGNESIUM     Status: None   Collection Time    01/17/14  4:47 AM      Result Value Ref Range   Magnesium 2.2  1.5 - 2.5 mg/dL  PROCALCITONIN     Status: None   Collection Time    01/17/14  4:47 AM      Result Value Ref Range   Procalcitonin 2.66     Comment:            Interpretation:     PCT > 2 ng/mL:     Systemic  infection (sepsis) is likely,     unless other causes are known.     (NOTE)             ICU PCT Algorithm               Non ICU PCT Algorithm        ----------------------------     ------------------------------             PCT < 0.25 ng/mL                 PCT < 0.1 ng/mL         Stopping of antibiotics            Stopping of antibiotics           strongly encouraged.               strongly encouraged.        ----------------------------     ------------------------------           PCT level decrease by               PCT < 0.25 ng/mL           >= 80% from peak PCT           OR PCT 0.25 - 0.5 ng/mL          Stopping of antibiotics  encouraged.         Stopping of antibiotics               encouraged.        ----------------------------     ------------------------------           PCT level decrease by              PCT >= 0.25 ng/mL           < 80% from peak PCT            AND PCT >= 0.5 ng/mL            Continuing antibiotics                                                  encouraged.           Continuing antibiotics                encouraged.        ----------------------------     ------------------------------         PCT level increase compared          PCT > 0.5 ng/mL             with peak PCT AND              PCT >= 0.5 ng/mL             Escalation of antibiotics                                              strongly encouraged.          Escalation of antibiotics            strongly encouraged.  APTT     Status: Abnormal   Collection Time    01/17/14  4:47 AM      Result Value Ref Range   aPTT 129 (*) 24 - 37 seconds   Comment:            IF BASELINE aPTT IS ELEVATED,     SUGGEST PATIENT RISK ASSESSMENT     BE USED TO DETERMINE APPROPRIATE     ANTICOAGULANT THERAPY.     SPECIMEN CHECKED FOR CLOTS     REPEATED TO VERIFY  RENAL FUNCTION PANEL     Status: Abnormal   Collection Time    01/17/14  4:47 AM      Result Value Ref  Range   Sodium 137  137 - 147 mEq/L   Potassium 3.6 (*) 3.7 - 5.3 mEq/L   Chloride 92 (*) 96 - 112 mEq/L   CO2 12 (*) 19 - 32 mEq/L   Glucose, Bld 232 (*) 70 - 99 mg/dL   BUN 34 (*) 6 - 23 mg/dL   Comment: DELTA CHECK NOTED   Creatinine, Ser 3.08 (*) 0.50 - 1.35 mg/dL   Comment: DELTA CHECK NOTED   Calcium 6.8 (*) 8.4 - 10.5 mg/dL   Phosphorus 3.8  2.3 - 4.6 mg/dL   Albumin 1.4 (*) 3.5 - 5.2 g/dL   GFR calc non Af Amer 23 (*) >90 mL/min   GFR calc Af Amer 27 (*) >90  mL/min   Comment: (NOTE)     The eGFR has been calculated using the CKD EPI equation.     This calculation has not been validated in all clinical situations.     eGFR's persistently <90 mL/min signify possible Chronic Kidney     Disease.  PATHOLOGIST SMEAR REVIEW     Status: None   Collection Time    01/17/14  4:47 AM      Result Value Ref Range   Path Review Marked neutrophilia     Comment: MARKED LEFT SHIFT (>5% METAS,MYELOS AND PROS, OCC BLAST NOTED)     Normocytic anemia with nRBCs.     Reviewed by Chrystie Nose. Saralyn Pilar, M.D.     01/17/14  POCT I-STAT 3, BLOOD GAS (G3+)     Status: Abnormal   Collection Time    01/17/14  5:29 AM      Result Value Ref Range   pH, Arterial 7.185 (*) 7.350 - 7.450   pCO2 arterial 32.2 (*) 35.0 - 45.0 mmHg   pO2, Arterial 70.0 (*) 80.0 - 100.0 mmHg   Bicarbonate 12.5 (*) 20.0 - 24.0 mEq/L   TCO2 14  0 - 100 mmol/L   O2 Saturation 92.0     Acid-base deficit 15.0 (*) 0.0 - 2.0 mmol/L   Patient temperature 94.7 F     Collection site ARTERIAL LINE     Drawn by RT     Sample type ARTERIAL     Comment NOTIFIED PHYSICIAN    LACTIC ACID, PLASMA     Status: Abnormal   Collection Time    01/17/14  7:55 AM      Result Value Ref Range   Lactic Acid, Venous 10.8 (*) 0.5 - 2.2 mmol/L  URINALYSIS, ROUTINE W REFLEX MICROSCOPIC     Status: Abnormal   Collection Time    01/17/14  9:37 AM      Result Value Ref Range   Color, Urine AMBER (*) YELLOW   Comment: BIOCHEMICALS MAY BE AFFECTED BY  COLOR   APPearance TURBID (*) CLEAR   Specific Gravity, Urine 1.021  1.005 - 1.030   pH 7.0  5.0 - 8.0   Glucose, UA 100 (*) NEGATIVE mg/dL   Hgb urine dipstick LARGE (*) NEGATIVE   Bilirubin Urine NEGATIVE  NEGATIVE   Ketones, ur NEGATIVE  NEGATIVE mg/dL   Protein, ur >300 (*) NEGATIVE mg/dL   Urobilinogen, UA 0.2  0.0 - 1.0 mg/dL   Nitrite NEGATIVE  NEGATIVE   Leukocytes, UA SMALL (*) NEGATIVE  URINE MICROSCOPIC-ADD ON     Status: Abnormal   Collection Time    01/17/14  9:37 AM      Result Value Ref Range   Squamous Epithelial / LPF RARE  RARE   WBC, UA 7-10  <3 WBC/hpf   RBC / HPF 21-50  <3 RBC/hpf   Bacteria, UA MANY (*) RARE   Casts GRANULAR CAST (*) NEGATIVE   Urine-Other LESS THAN 10 mL OF URINE SUBMITTED    GLUCOSE, CAPILLARY     Status: Abnormal   Collection Time    01/17/14 10:29 AM      Result Value Ref Range   Glucose-Capillary 206 (*) 70 - 99 mg/dL  TROPONIN I     Status: None   Collection Time    01/17/14 10:30 AM      Result Value Ref Range   Troponin I <0.30  <0.30 ng/mL   Comment:  Due to the release kinetics of cTnI,     a negative result within the first hours     of the onset of symptoms does not rule out     myocardial infarction with certainty.     If myocardial infarction is still suspected,     repeat the test at appropriate intervals.  VANCOMYCIN, RANDOM     Status: None   Collection Time    01/17/14 10:30 AM      Result Value Ref Range   Vancomycin Rm 14.6     Comment:            Random Vancomycin therapeutic     range is dependent on dosage and     time of specimen collection.     A peak range is 20.0-40.0 ug/mL     A trough range is 5.0-15.0 ug/mL             CBC     Status: Abnormal   Collection Time    01/17/14 10:30 AM      Result Value Ref Range   WBC 41.8 (*) 4.0 - 10.5 K/uL   Comment: WHITE COUNT CONFIRMED ON SMEAR   RBC 3.00 (*) 4.22 - 5.81 MIL/uL   Hemoglobin 8.9 (*) 13.0 - 17.0 g/dL   HCT 26.5 (*) 39.0 - 52.0 %    MCV 88.3  78.0 - 100.0 fL   MCH 29.7  26.0 - 34.0 pg   MCHC 33.6  30.0 - 36.0 g/dL   RDW 16.4 (*) 11.5 - 15.5 %   Platelets 141 (*) 150 - 400 K/uL   Comment: PLATELET COUNT CONFIRMED BY SMEAR     LARGE PLATELETS PRESENT  BASIC METABOLIC PANEL     Status: Abnormal   Collection Time    01/17/14 10:30 AM      Result Value Ref Range   Sodium 137  137 - 147 mEq/L   Potassium 3.9  3.7 - 5.3 mEq/L   Chloride 94 (*) 96 - 112 mEq/L   CO2 16 (*) 19 - 32 mEq/L   Glucose, Bld 213 (*) 70 - 99 mg/dL   BUN 27 (*) 6 - 23 mg/dL   Creatinine, Ser 2.31 (*) 0.50 - 1.35 mg/dL   Calcium 6.9 (*) 8.4 - 10.5 mg/dL   GFR calc non Af Amer 33 (*) >90 mL/min   GFR calc Af Amer 38 (*) >90 mL/min   Comment: (NOTE)     The eGFR has been calculated using the CKD EPI equation.     This calculation has not been validated in all clinical situations.     eGFR's persistently <90 mL/min signify possible Chronic Kidney     Disease.  LACTATE DEHYDROGENASE     Status: Abnormal   Collection Time    01/17/14 10:30 AM      Result Value Ref Range   LDH 1120 (*) 94 - 250 U/L  HEPATIC FUNCTION PANEL     Status: Abnormal   Collection Time    01/17/14 10:30 AM      Result Value Ref Range   Total Protein 5.1 (*) 6.0 - 8.3 g/dL   Albumin 1.3 (*) 3.5 - 5.2 g/dL   AST 693 (*) 0 - 37 U/L   ALT 669 (*) 0 - 53 U/L   Alkaline Phosphatase 165 (*) 39 - 117 U/L   Total Bilirubin 1.6 (*) 0.3 - 1.2 mg/dL   Bilirubin, Direct 1.3 (*) 0.0 - 0.3 mg/dL  Indirect Bilirubin 0.3  0.3 - 0.9 mg/dL  LIPASE, BLOOD     Status: Abnormal   Collection Time    01/17/14 10:30 AM      Result Value Ref Range   Lipase 125 (*) 11 - 59 U/L  AMYLASE     Status: None   Collection Time    01/17/14 10:30 AM      Result Value Ref Range   Amylase 77  0 - 105 U/L  FIBRINOGEN     Status: Abnormal   Collection Time    01/17/14 10:30 AM      Result Value Ref Range   Fibrinogen 520 (*) 204 - 475 mg/dL  HEPATITIS PANEL, ACUTE     Status: None    Collection Time    01/17/14 10:30 AM      Result Value Ref Range   Hepatitis B Surface Ag NEGATIVE  NEGATIVE   HCV Ab NEGATIVE  NEGATIVE   Hep A IgM NON REACTIVE  NON REACTIVE   Hep B C IgM NON REACTIVE  NON REACTIVE   Comment: (NOTE)     High levels of Hepatitis B Core IgM antibody are detectable     during the acute stage of Hepatitis B. This antibody is used     to differentiate current from past HBV infection.     Performed at [facility]  TSH     Status: None   Collection Time    01/17/14 10:30 AM      Result Value Ref Range   TSH 0.712  0.350 - 4.500 uIU/mL   Comment: Please note change in reference range.  POCT I-STAT 3, BLOOD GAS (G3+)     Status: Abnormal   Collection Time    01/17/14 12:58 PM      Result Value Ref Range   pH, Arterial 7.269 (*) 7.350 - 7.450   pCO2 arterial 38.4  35.0 - 45.0 mmHg   pO2, Arterial 64.0 (*) 80.0 - 100.0 mmHg   Bicarbonate 17.7 (*) 20.0 - 24.0 mEq/L   TCO2 19  0 - 100 mmol/L   O2 Saturation 90.0     Acid-base deficit 9.0 (*) 0.0 - 2.0 mmol/L   Patient temperature 97.5 F     Collection site ARTERIAL LINE     Drawn by RT     Sample type ARTERIAL    RENAL FUNCTION PANEL     Status: Abnormal   Collection Time    01/17/14  4:00 PM      Result Value Ref Range   Sodium 139  137 - 147 mEq/L   Potassium 3.4 (*) 3.7 - 5.3 mEq/L   Chloride 102  96 - 112 mEq/L   CO2 14 (*) 19 - 32 mEq/L   Glucose, Bld 153 (*) 70 - 99 mg/dL   BUN 21  6 - 23 mg/dL   Creatinine, Ser 1.78 (*) 0.50 - 1.35 mg/dL   Calcium 5.4 (*) 8.4 - 10.5 mg/dL   Comment: CRITICAL RESULT CALLED TO, READ BACK BY AND VERIFIED WITH:     K HUNT,RN 1747 01/17/14 WBOND   Phosphorus 2.3  2.3 - 4.6 mg/dL   Albumin 1.0 (*) 3.5 - 5.2 g/dL   GFR calc non Af Amer 45 (*) >90 mL/min   GFR calc Af Amer 52 (*) >90 mL/min   Comment: (NOTE)     The eGFR has been calculated using the CKD EPI equation.     This calculation has not been  validated in all clinical situations.     eGFR's  persistently <90 mL/min signify possible Chronic Kidney     Disease.  GLUCOSE, CAPILLARY     Status: Abnormal   Collection Time    01/17/14  4:15 PM      Result Value Ref Range   Glucose-Capillary 167 (*) 70 - 99 mg/dL  POCT I-STAT 3, BLOOD GAS (G3+)     Status: Abnormal   Collection Time    01/17/14  4:46 PM      Result Value Ref Range   pH, Arterial 7.211 (*) 7.350 - 7.450   pCO2 arterial 26.7 (*) 35.0 - 45.0 mmHg   pO2, Arterial 74.0 (*) 80.0 - 100.0 mmHg   Bicarbonate 10.8 (*) 20.0 - 24.0 mEq/L   TCO2 12  0 - 100 mmol/L   O2 Saturation 92.0     Acid-base deficit 16.0 (*) 0.0 - 2.0 mmol/L   Patient temperature 97.7 F     Collection site ARTERIAL LINE     Drawn by RT     Sample type ARTERIAL    GLUCOSE, CAPILLARY     Status: Abnormal   Collection Time    01/17/14  6:58 PM      Result Value Ref Range   Glucose-Capillary 149 (*) 70 - 99 mg/dL  POCT I-STAT 3, BLOOD GAS (G3+)     Status: Abnormal   Collection Time    01/17/14  8:07 PM      Result Value Ref Range   pH, Arterial 7.287 (*) 7.350 - 7.450   pCO2 arterial 41.6  35.0 - 45.0 mmHg   pO2, Arterial 61.0 (*) 80.0 - 100.0 mmHg   Bicarbonate 20.0  20.0 - 24.0 mEq/L   TCO2 21  0 - 100 mmol/L   O2 Saturation 89.0     Acid-base deficit 6.0 (*) 0.0 - 2.0 mmol/L   Patient temperature 97.4 F     Collection site ARTERIAL LINE     Drawn by RT     Sample type ARTERIAL    GLUCOSE, CAPILLARY     Status: Abnormal   Collection Time    02/10/2014 12:20 AM      Result Value Ref Range   Glucose-Capillary 146 (*) 70 - 99 mg/dL   Comment 1 Documented in Chart     Comment 2 Notify RN    BLOOD GAS, ARTERIAL     Status: Abnormal   Collection Time    02/05/2014  3:27 AM      Result Value Ref Range   FIO2 0.50     Delivery systems VENTILATOR     Mode PRESSURE REGULATED VOLUME CONTROL     VT 600     Rate 26     Peep/cpap 5.0     pH, Arterial 7.326 (*) 7.350 - 7.450   pCO2 arterial 48.3 (*) 35.0 - 45.0 mmHg   pO2, Arterial 66.6 (*)  80.0 - 100.0 mmHg   Bicarbonate 24.7 (*) 20.0 - 24.0 mEq/L   TCO2 26.2  0 - 100 mmol/L   Acid-base deficit 0.6  0.0 - 2.0 mmol/L   O2 Saturation 94.8     Patient temperature 97.5     Collection site A-LINE     Drawn by [phone]     Sample type ARTERIAL DRAW     Allens test (pass/fail) PASS  PASS  GLUCOSE, CAPILLARY     Status: Abnormal   Collection Time    01/14/2014  3:54 AM  Result Value Ref Range   Glucose-Capillary 123 (*) 70 - 99 mg/dL   Comment 1 Documented in Chart     Comment 2 Notify RN    MAGNESIUM     Status: None   Collection Time    01/27/2014  4:30 AM      Result Value Ref Range   Magnesium 2.0  1.5 - 2.5 mg/dL  APTT     Status: Abnormal   Collection Time    02/07/2014  4:30 AM      Result Value Ref Range   aPTT 194 (*) 24 - 37 seconds   Comment:            IF BASELINE aPTT IS ELEVATED,     SUGGEST PATIENT RISK ASSESSMENT     BE USED TO DETERMINE APPROPRIATE     ANTICOAGULANT THERAPY.  CBC     Status: Abnormal   Collection Time    02/06/2014  4:30 AM      Result Value Ref Range   WBC 41.0 (*) 4.0 - 10.5 K/uL   Comment: REPEATED TO VERIFY     WHITE COUNT CONFIRMED ON SMEAR   RBC 2.52 (*) 4.22 - 5.81 MIL/uL   Hemoglobin 7.5 (*) 13.0 - 17.0 g/dL   HCT 21.8 (*) 39.0 - 52.0 %   MCV 86.5  78.0 - 100.0 fL   MCH 29.8  26.0 - 34.0 pg   MCHC 34.4  30.0 - 36.0 g/dL   RDW 16.2 (*) 11.5 - 15.5 %   Platelets    150 - 400 K/uL   Value: PLATELET CLUMPS NOTED ON SMEAR, COUNT APPEARS INCREASED  DIFFERENTIAL     Status: Abnormal   Collection Time    02/08/2014  4:30 AM      Result Value Ref Range   Neutrophils Relative % 91 (*) 43 - 77 %   Lymphocytes Relative 5 (*) 12 - 46 %   Monocytes Relative 3  3 - 12 %   Eosinophils Relative 0  0 - 5 %   Basophils Relative 1  0 - 1 %   Neutro Abs 37.3 (*) 1.7 - 7.7 K/uL   Lymphs Abs 2.1  0.7 - 4.0 K/uL   Monocytes Absolute 1.2 (*) 0.1 - 1.0 K/uL   Eosinophils Absolute 0.0  0.0 - 0.7 K/uL   Basophils Absolute 0.4 (*) 0.0 - 0.1 K/uL    RBC Morphology BURR CELLS     Comment: BASOPHILIC STIPPLING     RARE NRBCs   WBC Morphology INCREASED BANDS (>20% BANDS)     Comment: MARKED LEFT SHIFT (>5% METAS,MYELOS AND PROS, OCC BLAST NOTED)  PHOSPHORUS     Status: Abnormal   Collection Time    01/16/2014  4:30 AM      Result Value Ref Range   Phosphorus 1.5 (*) 2.3 - 4.6 mg/dL  COMPREHENSIVE METABOLIC PANEL     Status: Abnormal   Collection Time    02/04/2014  4:30 AM      Result Value Ref Range   Sodium 136 (*) 137 - 147 mEq/L   Potassium 3.9  3.7 - 5.3 mEq/L   Chloride 97  96 - 112 mEq/L   CO2 24  19 - 32 mEq/L   Glucose, Bld 124 (*) 70 - 99 mg/dL   BUN 14  6 - 23 mg/dL   Creatinine, Ser 1.20  0.50 - 1.35 mg/dL   Calcium 6.5 (*) 8.4 - 10.5 mg/dL   Total Protein 4.3 (*)  6.0 - 8.3 g/dL   Albumin 1.2 (*) 3.5 - 5.2 g/dL   AST 351 (*) 0 - 37 U/L   ALT 495 (*) 0 - 53 U/L   Alkaline Phosphatase 138 (*) 39 - 117 U/L   Total Bilirubin 1.8 (*) 0.3 - 1.2 mg/dL   GFR calc non Af Amer 73 (*) >90 mL/min   GFR calc Af Amer 84 (*) >90 mL/min   Comment: (NOTE)     The eGFR has been calculated using the CKD EPI equation.     This calculation has not been validated in all clinical situations.     eGFR's persistently <90 mL/min signify possible Chronic Kidney     Disease.  PHENYTOIN LEVEL, TOTAL     Status: Abnormal   Collection Time    01/23/2014  4:30 AM      Result Value Ref Range   Phenytoin Lvl 5.3 (*) 10.0 - 20.0 ug/mL  LACTIC ACID, PLASMA     Status: Abnormal   Collection Time    01/15/2014  4:30 AM      Result Value Ref Range   Lactic Acid, Venous 5.9 (*) 0.5 - 2.2 mmol/L  LIPASE, BLOOD     Status: None   Collection Time    01/13/2014  4:30 AM      Result Value Ref Range   Lipase 34  11 - 59 U/L  PROTIME-INR     Status: Abnormal   Collection Time    01/14/2014  4:30 AM      Result Value Ref Range   Prothrombin Time 24.4 (*) 11.6 - 15.2 seconds   INR 2.28 (*) 0.00 - 1.49  GLUCOSE, CAPILLARY     Status: Abnormal    Collection Time    02/09/2014  7:56 AM      Result Value Ref Range   Glucose-Capillary 119 (*) 70 - 99 mg/dL  PREPARE FRESH FROZEN PLASMA     Status: None   Collection Time    02/06/2014 10:12 AM      Result Value Ref Range   Unit Number [id number]     Blood Component Type THAWED PLASMA     Unit division 00     Status of Unit ISSUED     Transfusion Status OK TO TRANSFUSE     Unit Number [id number]     Blood Component Type THAWED PLASMA     Unit division 00     Status of Unit ISSUED     Transfusion Status OK TO TRANSFUSE     Unit Number [id number]     Blood Component Type THAWED PLASMA     Unit division 00     Status of Unit ISSUED     Transfusion Status OK TO TRANSFUSE     Unit Number [id number]     Blood Component Type THAWED PLASMA     Unit division 00     Status of Unit ISSUED     Transfusion Status OK TO TRANSFUSE    TYPE AND SCREEN     Status: None   Collection Time    01/31/2014 10:50 AM      Result Value Ref Range   ABO/RH(D) O NEG     Antibody Screen NEG     Sample Expiration 01/17/2014    GLUCOSE, CAPILLARY     Status: Abnormal   Collection Time    01/12/2014 11:53 AM      Result Value Ref Range   Glucose-Capillary 121 (*) 70 -  99 mg/dL    Ct Head Wo Contrast  01/17/2014   CLINICAL DATA:  Patient in coma  EXAM: CT HEAD WITHOUT CONTRAST  TECHNIQUE: Contiguous axial images were obtained from the base of the skull through the vertex without intravenous contrast.  COMPARISON:  CT TEMPORAL BONES W/O CM dated 10/29/2013; CT HEAD W/O CM dated 03/01/2013  FINDINGS: There is no evidence of mass effect, midline shift or extra-axial fluid collections. There is no evidence of a space-occupying lesion or intracranial hemorrhage. There is bilateral cerebellar encephalomalacia, right greater than left. There is no evidence of a cortical-based area of acute infarction.  The ventricles and sulci are appropriate for the patient's age. The basal cisterns are patent.  Visualized  portions of the orbits are unremarkable. There is opacification of bilateral mastoid air cells unchanged from recent CT of the temporal bones.  There is a suboccipital craniectomy.  There is contrast in the left ethmoid sinus, left maxillary sinus, sphenoid sinus and nasopharynx likely reflecting reflux of oral contrast from within the stomach.  IMPRESSION: No acute intracranial pathology.  Contrast in the left ethmoid sinus, left maxillary sinus, sphenoid sinus and nasopharynx likely reflecting reflux of oral contrast from within the stomach.  Mastoiditis unchanged from 10/29/2013.   Electronically Signed   By: Kathreen Devoid   On: 01/17/2014 16:19   Dg Chest Port 1 View  02/04/2014   CLINICAL DATA:  Edema, pneumonia  EXAM: PORTABLE CHEST - 1 VIEW  COMPARISON:  Portable exam 1308 hr compared to 01/17/2014  FINDINGS: Tip of endotracheal tube projects 6.3 cm above carina.  Bilateral subclavian lines with tips projecting over proximal SVC.  Nasogastric tube extends into stomach, tip projecting over pylorus.  Normal heart size and mediastinal contours.  Bilateral pulmonary infiltrates greatest in left mid lung and at right base, slightly increased.  Small right pleural effusion, increased.  Underlying COPD.  No pneumothorax.  IMPRESSION: Bilateral pulmonary infiltrates, slightly increased.  Small right pleural effusion, increased.   Electronically Signed   By: Lavonia Dana M.D.   On: 01/31/2014 07:59   Dg Chest Port 1 View  01/17/2014   CLINICAL DATA:  Shortness of Breath  EXAM: PORTABLE CHEST - 1 VIEW  COMPARISON:  January 16, 2014  FINDINGS: Endotracheal tube tip is 4.9 cm above the carina. There is a central catheter in each jugular vein region, both with tips in the superior vena cava. Nasogastric tube tip and side port are in the stomach. No pneumothorax.  There is underlying emphysema. There is moderate interstitial edema. There is is a loculated effusion on the right with a smaller effusion on the left. Compared  to 1 day prior, there is less edema in the left mid lung region. The heart size is normal. The pulmonary vascularity reflects underlying emphysema. No appreciable adenopathy.  IMPRESSION: Tube and catheter positions as described without appreciable pneumothorax. Note that there is extensive bullous disease in the right upper lobe.  There is diffuse edema, less on the left compared to 1 day prior. There are bilateral pleural effusions, larger on the right than on the left. These findings overall are consistent with congestive heart failure superimposed on emphysematous change.   Electronically Signed   By: Lowella Grip M.D.   On: 01/17/2014 07:22   Dg Chest Port 1 View  01/16/2014   CLINICAL DATA:  Hemodialysis catheter placement. Bilateral pulmonary infiltrates.  EXAM: PORTABLE CHEST - 1 VIEW 5:36 p.m.  COMPARISON:  01/16/2014 5:29 a.m., 01/15/2014 and 01/14/2014  FINDINGS: Right jugular vein catheter has been inserted and the tip is in the superior vena cava. No pneumothorax. Left-sided jugular vein catheter tip is also in the superior vena cava. NG tube and endotracheal tube appear in good position. There is increasing infiltrate in the left lung, probably pulmonary edema. There is a moderate right and small left pleural effusion which are unchanged. There is pulmonary edema at the right lung base which is unchanged. Heart size and vascularity appear normal.  IMPRESSION: New central venous catheter is in good position. Progressive pulmonary edema on the left. No other change. Diffuse emphysematous changes most prominent in the right upper lobe.   Electronically Signed   By: Rozetta Nunnery M.D.   On: 01/16/2014 17:51    Review of Systems  Unable to perform ROS: intubated   Blood pressure 85/47, pulse 111, temperature 97.6 F (36.4 C), temperature source Oral, resp. rate 26, height _0  (1.88 m), weight 72.5 kg (159 lb 13.3 oz), SpO2 97.00%. Physical Exam  Constitutional: He appears well-developed. He  is intubated.  HENT:  Head: Normocephalic.  Eyes: Conjunctivae are normal. Pupils are equal, round, and reactive to light.  Cardiovascular: Regular rhythm.   Respiratory: Breath sounds normal. He is intubated.  Patient on the ventilator  GI: He exhibits distension (with a doughy sensation or feel). Bowel sounds are absent. There is tenderness. There is rigidity, rebound and guarding.    Assessment/Plan: Sepsis in male patient with abdominal tenderness, still hypotensive and on pressors.  On CRT, Lactic acidosis and good chance of dead bowel. INR has been elevated and we are currently awiting repeat after 4U FFP and Vik K  INR is better but still abnormal.  Hemoglobin 7.5.  Will give 2 U FFP and 2U PRBC.  Exploratory laparotomy.  Gwenyth Ober 01/13/2014, 1:51 PM

## 2014-01-18 NOTE — Progress Notes (Signed)
NUTRITION FOLLOW UP  Intervention:   - If able to tolerate enteral feeds, restart Osmolite 1.2 at 55 ml/hr (1320 mL) using PEPuP protocol with 30 ml Prostat daily (1320 ml/24 hours, 1684 kcal, 88 g protein, 1070 ml free water).  - If unable to tolerate enteral feeds, consider initiating TPN.   Nutrition Dx:   Inadequate oral intake related to inability to eat as evidenced by NPO status, ongoing  Goal:   Patient will meet >/=90% of estimated nutrition needs, not met, TF held  Monitor:   Vent status, TF initiation and tolerance, bowel function/need for TPN, weights, labs  Assessment:   PMHx significant for schizoaffective disorder, HOH, polysubstance abuse, constipation. From facility. Pt is wheel-chair bound, per hx pt is very conversant and social at his facility.  Patient is currently intubated on ventilator support with severe sepsis and hypotension. Does not meet criteria for SBT.  MV: 15.2 Temp: 36.5  Worsening renal failure. Emergent CRRT started for lactic acidosis management.  New diarrhea, c.diff negative Concern for dead bowel.   TF orders: Osmolite 1.2 at 55 ml/hr, 30 ml Prostat daily,   TF held 4/6 due to aspiration. Patient taken to OR emergently for exploratory laparotomy and small bowel resection (distal jejunum and proximal ileum).   Per RN, patient will likely not be able to tolerate enteral feeds post-surgery.    Height: Ht Readings from Last 1 Encounters:  01/09/14 6' 2" (1.88 m)    Weight Status:   Wt Readings from Last 1 Encounters:  01/25/2014 159 lb 13.3 oz (72.5 kg)    Re-estimated needs:  Kcal: 1674 kcal Protein: 80-95 grams  Fluid: >1.6 L/day  Skin: 3+ edema  Diet Order:     Intake/Output Summary (Last 24 hours) at 01/30/2014 1632 Last data filed at 02/01/2014 1618  Gross per 24 hour  Intake 6356.78 ml  Output   5010 ml  Net 1346.78 ml    Last BM: 4/3   Labs:   Recent Labs Lab 01/16/14 2030 01/17/14 0447 01/17/14 1030  01/17/14 1600 02/03/2014 0430  NA 133* 137 137 139 136*  K 4.2 3.6* 3.9 3.4* 3.9  CL 91* 92* 94* 102 97  CO2 9* 12* 16* 14* 24  BUN 54* 34* 27* 21 14  CREATININE 4.98* 3.08* 2.31* 1.78* 1.20  CALCIUM 6.7* 6.8* 6.9* 5.4* 6.5*  MG 2.6* 2.2  --   --  2.0  PHOS 7.5* 3.8  --  2.3 1.5*  GLUCOSE 198* 232* 213* 153* 124*    CBG (last 3)   Recent Labs  01/30/2014 0354 01/12/2014 0756 01/12/2014 1153  GLUCAP 123* 119* 121*    Scheduled Meds: . antiseptic oral rinse  15 mL Mouth Rinse QID  . chlorhexidine  15 mL Mouth Rinse BID  . feeding supplement (PRO-STAT SUGAR FREE 64)  30 mL Per Tube Q1500  . insulin aspart  0-9 Units Subcutaneous 6 times per day  . ipratropium-albuterol  3 mL Nebulization Q6H  . levothyroxine  37.5 mcg Intravenous Daily  . meropenem (MERREM) IV  1 g Intravenous Q12H  . micafungin (MYCAMINE) IV  100 mg Intravenous Daily  . pantoprazole (PROTONIX) IV  40 mg Intravenous Q24H  . vancomycin  750 mg Intravenous Q24H    Continuous Infusions: . dextrose 20 mL (01/16/14 1545)  . feeding supplement (OSMOLITE 1.2 CAL) Stopped (01/16/14 1800)  . fentaNYL infusion INTRAVENOUS 400 mcg/hr (01/20/2014 1500)  . heparin 10,000 units/ 20 mL infusion syringe    . norepinephrine (  LEVOPHED) Adult infusion 50 mcg/min (02/07/2014 1500)  . dialysis replacement fluid (prismasate) 800 mL/hr at 02/02/2014 1135  . dialysis replacement fluid (prismasate) 700 mL/hr at 01/17/2014 1109  . dialysate (PRISMASATE) 2,000 mL/hr at  0741    Larey Seat, RD, LDN Pager #: [phone] After-Hours Pager #: [phone]

## 2014-01-18 NOTE — Transfer of Care (Signed)
Immediate Anesthesia Transfer of Care Note  Patient: [name] [name]  Procedure(s) Performed: Procedure(s): EXPLORATORY LAPAROTOMY (N/A) SMALL BOWEL RESECTION (N/A) APPLICATION OF WOUND VAC (N/A)  Patient Location: ICU  Anesthesia Type:General  Level of Consciousness: Patient remains intubated per anesthesia plan  Airway & Oxygen Therapy: Patient remains intubated per anesthesia plan  Post-op Assessment: Post -op Vital signs reviewed and stable  Post vital signs: stable  Complications: No apparent anesthesia complications

## 2014-01-19 ENCOUNTER — Encounter (HOSPITAL_COMMUNITY): Payer: Self-pay | Admitting: Certified Registered"

## 2014-01-19 ENCOUNTER — Encounter (HOSPITAL_COMMUNITY): Payer: Self-pay | Admitting: Anesthesiology

## 2014-01-19 ENCOUNTER — Encounter (HOSPITAL_COMMUNITY): Admission: EM | Disposition: E | Payer: Self-pay | Source: Skilled Nursing Facility | Attending: Internal Medicine

## 2014-01-19 DIAGNOSIS — R0602 Shortness of breath: Secondary | ICD-10-CM | POA: Diagnosis not present

## 2014-01-19 DIAGNOSIS — A4102 Sepsis due to Methicillin resistant Staphylococcus aureus: Secondary | ICD-10-CM | POA: Diagnosis not present

## 2014-01-19 DIAGNOSIS — E860 Dehydration: Secondary | ICD-10-CM

## 2014-01-19 DIAGNOSIS — E872 Acidosis, unspecified: Secondary | ICD-10-CM

## 2014-01-19 HISTORY — PX: VACUUM ASSISTED CLOSURE CHANGE: SHX5227

## 2014-01-19 LAB — CBC
HCT: 26.9 % — ABNORMAL LOW (ref 39.0–52.0)
HEMATOCRIT: 23.8 % — AB (ref 39.0–52.0)
HEMATOCRIT: 33.7 % — AB (ref 39.0–52.0)
HEMATOCRIT: 34 % — AB (ref 39.0–52.0)
HEMATOCRIT: 30.2 % — AB (ref 39.0–52.0)
HEMOGLOBIN: 11.9 g/dL — AB (ref 13.0–17.0)
HEMOGLOBIN: 10.9 g/dL — AB (ref 13.0–17.0)
Hemoglobin: 9.4 g/dL — ABNORMAL LOW (ref 13.0–17.0)
Hemoglobin: 8.5 g/dL — ABNORMAL LOW (ref 13.0–17.0)
Hemoglobin: 12.2 g/dL — ABNORMAL LOW (ref 13.0–17.0)
MCH: 29.8 pg (ref 26.0–34.0)
MCH: 30.4 pg (ref 26.0–34.0)
MCH: 29.3 pg (ref 26.0–34.0)
MCH: 29.5 pg (ref 26.0–34.0)
MCH: 29.4 pg (ref 26.0–34.0)
MCHC: 34.9 g/dL (ref 30.0–36.0)
MCHC: 35.7 g/dL (ref 30.0–36.0)
MCHC: 35.3 g/dL (ref 30.0–36.0)
MCHC: 35.9 g/dL (ref 30.0–36.0)
MCHC: 36.1 g/dL — ABNORMAL HIGH (ref 30.0–36.0)
MCV: 85.4 fL (ref 78.0–100.0)
MCV: 85 fL (ref 78.0–100.0)
MCV: 83 fL (ref 78.0–100.0)
MCV: 82.3 fL (ref 78.0–100.0)
MCV: 81.4 fL (ref 78.0–100.0)
PLATELETS: 31 10*3/uL — AB (ref 150–400)
Platelets: 26 10*3/uL — CL (ref 150–400)
Platelets: 31 10*3/uL — ABNORMAL LOW (ref 150–400)
Platelets: 30 10*3/uL — ABNORMAL LOW (ref 150–400)
Platelets: 92 10*3/uL — ABNORMAL LOW (ref 150–400)
RBC: 3.15 MIL/uL — AB (ref 4.22–5.81)
RBC: 2.8 MIL/uL — AB (ref 4.22–5.81)
RBC: 4.06 MIL/uL — AB (ref 4.22–5.81)
RBC: 4.13 MIL/uL — ABNORMAL LOW (ref 4.22–5.81)
RBC: 3.71 MIL/uL — AB (ref 4.22–5.81)
RDW: 15 % (ref 11.5–15.5)
RDW: 15.1 % (ref 11.5–15.5)
RDW: 14.3 % (ref 11.5–15.5)
RDW: 14.3 % (ref 11.5–15.5)
RDW: 14.3 % (ref 11.5–15.5)
WBC: 32 10*3/uL — ABNORMAL HIGH (ref 4.0–10.5)
WBC: 32.7 10*3/uL — AB (ref 4.0–10.5)
WBC: 20.4 10*3/uL — AB (ref 4.0–10.5)
WBC: 19.4 10*3/uL — ABNORMAL HIGH (ref 4.0–10.5)
WBC: 19.9 10*3/uL — ABNORMAL HIGH (ref 4.0–10.5)

## 2014-01-19 LAB — PREPARE FRESH FROZEN PLASMA
UNIT DIVISION: 0
UNIT DIVISION: 0
Unit division: 0
Unit division: 0
Unit division: 0
Unit division: 0

## 2014-01-19 LAB — POCT I-STAT 3, ART BLOOD GAS (G3+)
Acid-Base Excess: 2 mmol/L (ref 0.0–2.0)
Acid-base deficit: 2 mmol/L (ref 0.0–2.0)
Acid-base deficit: 1 mmol/L (ref 0.0–2.0)
Bicarbonate: 24.8 mEq/L — ABNORMAL HIGH (ref 20.0–24.0)
Bicarbonate: 26.1 mEq/L — ABNORMAL HIGH (ref 20.0–24.0)
Bicarbonate: 25.3 mEq/L — ABNORMAL HIGH (ref 20.0–24.0)
O2 Saturation: 91 %
O2 Saturation: 88 %
O2 Saturation: 100 %
PCO2 ART: 47.7 mmHg — AB (ref 35.0–45.0)
PH ART: 7.316 — AB (ref 7.350–7.450)
PH ART: 7.278 — AB (ref 7.350–7.450)
Patient temperature: 95.7
Patient temperature: 98.6
TCO2: 26 mmol/L (ref 0–100)
TCO2: 28 mmol/L (ref 0–100)
TCO2: 26 mmol/L (ref 0–100)
pCO2 arterial: 55.8 mmHg — ABNORMAL HIGH (ref 35.0–45.0)
pCO2 arterial: 34 mmHg — ABNORMAL LOW (ref 35.0–45.0)
pH, Arterial: 7.479 — ABNORMAL HIGH (ref 7.350–7.450)
pO2, Arterial: 62 mmHg — ABNORMAL LOW (ref 80.0–100.0)
pO2, Arterial: 63 mmHg — ABNORMAL LOW (ref 80.0–100.0)
pO2, Arterial: 346 mmHg — ABNORMAL HIGH (ref 80.0–100.0)

## 2014-01-19 LAB — POCT I-STAT 7, (LYTES, BLD GAS, ICA,H+H)
Bicarbonate: 26.2 mEq/L — ABNORMAL HIGH (ref 20.0–24.0)
Calcium, Ion: 0.87 mmol/L — ABNORMAL LOW (ref 1.12–1.23)
HCT: 23 % — ABNORMAL LOW (ref 39.0–52.0)
Hemoglobin: 7.8 g/dL — ABNORMAL LOW (ref 13.0–17.0)
O2 SAT: 100 %
PCO2 ART: 51.7 mmHg — AB (ref 35.0–45.0)
POTASSIUM: 3.7 meq/L (ref 3.7–5.3)
Sodium: 138 mEq/L (ref 137–147)
TCO2: 28 mmol/L (ref 0–100)
pH, Arterial: 7.312 — ABNORMAL LOW (ref 7.350–7.450)
pO2, Arterial: 274 mmHg — ABNORMAL HIGH (ref 80.0–100.0)

## 2014-01-19 LAB — DIC (DISSEMINATED INTRAVASCULAR COAGULATION) PANEL
PLATELETS: 29 10*3/uL — AB (ref 150–400)
SMEAR REVIEW: NONE SEEN
aPTT: 62 seconds — ABNORMAL HIGH (ref 24–37)

## 2014-01-19 LAB — RENAL FUNCTION PANEL
ALBUMIN: 1.6 g/dL — AB (ref 3.5–5.2)
Albumin: 2.1 g/dL — ABNORMAL LOW (ref 3.5–5.2)
BUN: 7 mg/dL (ref 6–23)
BUN: 6 mg/dL (ref 6–23)
CALCIUM: 7.2 mg/dL — AB (ref 8.4–10.5)
CHLORIDE: 103 meq/L (ref 96–112)
CO2: 26 meq/L (ref 19–32)
CO2: 20 meq/L (ref 19–32)
Calcium: 6.7 mg/dL — ABNORMAL LOW (ref 8.4–10.5)
Chloride: 101 mEq/L (ref 96–112)
Creatinine, Ser: 0.67 mg/dL (ref 0.50–1.35)
Creatinine, Ser: 0.69 mg/dL (ref 0.50–1.35)
Glucose, Bld: 138 mg/dL — ABNORMAL HIGH (ref 70–99)
Glucose, Bld: 94 mg/dL (ref 70–99)
PHOSPHORUS: 1.6 mg/dL — AB (ref 2.3–4.6)
POTASSIUM: 3.5 meq/L — AB (ref 3.7–5.3)
Phosphorus: 1.3 mg/dL — ABNORMAL LOW (ref 2.3–4.6)
Potassium: 4 mEq/L (ref 3.7–5.3)
SODIUM: 137 meq/L (ref 137–147)
Sodium: 138 mEq/L (ref 137–147)

## 2014-01-19 LAB — HEPATIC FUNCTION PANEL
ALBUMIN: 1.5 g/dL — AB (ref 3.5–5.2)
ALT: 252 U/L — AB (ref 0–53)
AST: 171 U/L — AB (ref 0–37)
Alkaline Phosphatase: 111 U/L (ref 39–117)
BILIRUBIN DIRECT: 3.9 mg/dL — AB (ref 0.0–0.3)
Indirect Bilirubin: 0.6 mg/dL (ref 0.3–0.9)
TOTAL PROTEIN: 4.1 g/dL — AB (ref 6.0–8.3)
Total Bilirubin: 4.5 mg/dL — ABNORMAL HIGH (ref 0.3–1.2)

## 2014-01-19 LAB — IRON AND TIBC
IRON: 117 ug/dL (ref 42–135)
Saturation Ratios: 78 % — ABNORMAL HIGH (ref 20–55)
TIBC: 150 ug/dL — ABNORMAL LOW (ref 215–435)
UIBC: 33 ug/dL — ABNORMAL LOW (ref 125–400)

## 2014-01-19 LAB — GLUCOSE, CAPILLARY
GLUCOSE-CAPILLARY: 129 mg/dL — AB (ref 70–99)
GLUCOSE-CAPILLARY: 155 mg/dL — AB (ref 70–99)
GLUCOSE-CAPILLARY: 64 mg/dL — AB (ref 70–99)
GLUCOSE-CAPILLARY: 73 mg/dL (ref 70–99)
GLUCOSE-CAPILLARY: 78 mg/dL (ref 70–99)
GLUCOSE-CAPILLARY: 87 mg/dL (ref 70–99)
GLUCOSE-CAPILLARY: 98 mg/dL (ref 70–99)
Glucose-Capillary: 86 mg/dL (ref 70–99)

## 2014-01-19 LAB — DIC (DISSEMINATED INTRAVASCULAR COAGULATION)PANEL
D-Dimer, Quant: 8.29 ug/mL-FEU — ABNORMAL HIGH (ref 0.00–0.48)
Fibrinogen: 278 mg/dL (ref 204–475)
INR: 1.92 — ABNORMAL HIGH (ref 0.00–1.49)
Prothrombin Time: 21.4 seconds — ABNORMAL HIGH (ref 11.6–15.2)

## 2014-01-19 LAB — LACTIC ACID, PLASMA
LACTIC ACID, VENOUS: 2.6 mmol/L — AB (ref 0.5–2.2)
Lactic Acid, Venous: 3 mmol/L — ABNORMAL HIGH (ref 0.5–2.2)

## 2014-01-19 LAB — PREPARE RBC (CROSSMATCH)

## 2014-01-19 LAB — POCT I-STAT, CHEM 8
BUN: <3 mg/dL — ABNORMAL LOW (ref 6–23)
Calcium, Ion: 0.93 mmol/L — ABNORMAL LOW (ref 1.12–1.23)
Chloride: 104 mEq/L (ref 96–112)
Creatinine, Ser: 0.7 mg/dL (ref 0.50–1.35)
Glucose, Bld: 91 mg/dL (ref 70–99)
HCT: 34 % — ABNORMAL LOW (ref 39.0–52.0)
Hemoglobin: 11.6 g/dL — ABNORMAL LOW (ref 13.0–17.0)
Potassium: 3.8 mEq/L (ref 3.7–5.3)
Sodium: 135 mEq/L — ABNORMAL LOW (ref 137–147)
TCO2: 24 mmol/L (ref 0–100)

## 2014-01-19 LAB — MAGNESIUM: Magnesium: 2 mg/dL (ref 1.5–2.5)

## 2014-01-19 LAB — PHENYTOIN LEVEL, TOTAL: Phenytoin Lvl: 4 ug/mL — ABNORMAL LOW (ref 10.0–20.0)

## 2014-01-19 SURGERY — REPLACEMENT, WOUND VAC DRESSING, ABDOMEN
Anesthesia: General | Site: Abdomen

## 2014-01-19 MED ORDER — DEXTROSE 50 % IV SOLN
25.0000 mL | Freq: Once | INTRAVENOUS | Status: AC | PRN
  Administered 2014-01-19: 25 mL via INTRAVENOUS

## 2014-01-19 MED ORDER — SODIUM CHLORIDE 0.9 % IV SOLN
1.0000 mg/h | INTRAVENOUS | Status: DC
  Filled 2014-01-19: qty 10

## 2014-01-19 MED ORDER — HEPARIN SODIUM (PORCINE) 1000 UNIT/ML DIALYSIS: 1000.0000 [IU] | INTRAMUSCULAR | Status: DC | PRN

## 2014-01-19 MED ORDER — MIDAZOLAM HCL 2 MG/2ML IJ SOLN
INTRAMUSCULAR | Status: AC
  Filled 2014-01-19: qty 2

## 2014-01-19 MED ORDER — SODIUM CHLORIDE 0.9 % IV SOLN
0.0300 [IU]/min | INTRAVENOUS | Status: DC
  Filled 2014-01-19: qty 2.5

## 2014-01-19 MED ORDER — PHENYTOIN SODIUM 50 MG/ML IJ SOLN
50.0000 mg | Freq: Two times a day (BID) | INTRAMUSCULAR | Status: DC
  Administered 2014-01-19 – 2014-01-22 (×6): 50 mg via INTRAVENOUS
  Administered 2014-01-22: 10:00:00 via INTRAVENOUS
  Administered 2014-01-23 – 2014-01-24 (×3): 50 mg via INTRAVENOUS
  Filled 2014-01-19 (×11): qty 1

## 2014-01-19 MED ORDER — ROCURONIUM BROMIDE 50 MG/5ML IV SOLN
INTRAVENOUS | Status: AC
  Filled 2014-01-19: qty 1

## 2014-01-19 MED ORDER — PHENYTOIN SODIUM 50 MG/ML IJ SOLN
50.0000 mg | Freq: Two times a day (BID) | INTRAMUSCULAR | Status: DC
  Filled 2014-01-19 (×2): qty 1

## 2014-01-19 MED ORDER — SODIUM CHLORIDE 0.9 % IV SOLN
INTRAVENOUS | Status: AC
  Administered 2014-01-19 – 2014-01-20 (×3): via INTRAVENOUS

## 2014-01-19 MED ORDER — SODIUM CHLORIDE 0.9 % IV SOLN
0.0000 mg/h | INTRAVENOUS | Status: DC
  Administered 2014-01-19: 2 mg/h via INTRAVENOUS
  Administered 2014-01-20: 1 mg/h via INTRAVENOUS
  Administered 2014-01-22: 2 mg/h via INTRAVENOUS
  Filled 2014-01-19 (×6): qty 10

## 2014-01-19 MED ORDER — SODIUM CHLORIDE 0.9 % IV SOLN
3.0000 ug/kg/min | INTRAVENOUS | Status: DC
  Administered 2014-01-19: 0.5 ug/kg/min via INTRAVENOUS
  Filled 2014-01-19 (×2): qty 20

## 2014-01-19 MED ORDER — 0.9 % SODIUM CHLORIDE (POUR BTL) OPTIME
TOPICAL | Status: DC | PRN
  Administered 2014-01-19 (×2): 1000 mL

## 2014-01-19 MED ORDER — SODIUM PHOSPHATE 3 MMOLE/ML IV SOLN
20.0000 mmol | Freq: Once | INTRAVENOUS | Status: AC
  Administered 2014-01-19: 20 mmol via INTRAVENOUS
  Filled 2014-01-19: qty 6.67

## 2014-01-19 MED ORDER — SODIUM CHLORIDE 0.9 % IV BOLUS (SEPSIS)
500.0000 mL | Freq: Once | INTRAVENOUS | Status: AC
  Administered 2014-01-19: 500 mL via INTRAVENOUS

## 2014-01-19 MED ORDER — MIDAZOLAM HCL 2 MG/2ML IJ SOLN: 2.0000 mg | INTRAMUSCULAR | Status: DC | PRN

## 2014-01-19 MED ORDER — DEXTROSE 50 % IV SOLN
INTRAVENOUS | Status: AC
  Filled 2014-01-19: qty 50

## 2014-01-19 MED ORDER — VECURONIUM BROMIDE 10 MG IV SOLR
7.0000 mg | Freq: Once | INTRAVENOUS | Status: AC
  Administered 2014-01-19: 7 mg via INTRAVENOUS

## 2014-01-19 MED ORDER — MIDAZOLAM BOLUS VIA INFUSION
1.0000 mg | INTRAVENOUS | Status: DC | PRN
  Filled 2014-01-19: qty 2

## 2014-01-19 MED ORDER — VECURONIUM BROMIDE 10 MG IV SOLR
INTRAVENOUS | Status: AC
  Filled 2014-01-19: qty 10

## 2014-01-19 MED ORDER — ARTIFICIAL TEARS OP OINT
1.0000 "application " | TOPICAL_OINTMENT | Freq: Three times a day (TID) | OPHTHALMIC | Status: DC
  Administered 2014-01-19 – 2014-01-20 (×2): 1 via OPHTHALMIC
  Filled 2014-01-19 (×2): qty 3.5

## 2014-01-19 MED ORDER — FENTANYL CITRATE 0.05 MG/ML IJ SOLN
INTRAMUSCULAR | Status: AC
  Filled 2014-01-19: qty 5

## 2014-01-19 MED ORDER — CISATRACURIUM BOLUS VIA INFUSION
0.1000 mg/kg | Freq: Once | INTRAVENOUS | Status: DC
  Filled 2014-01-19: qty 7

## 2014-01-19 SURGICAL SUPPLY — 32 items
BENZOIN TINCTURE PRP APPL 2/3 (GAUZE/BANDAGES/DRESSINGS) ×6 IMPLANT
CANISTER SUCTION 2500CC (MISCELLANEOUS) ×3 IMPLANT
CANISTER WOUND CARE 500ML ATS (WOUND CARE) ×3 IMPLANT
COVER SURGICAL LIGHT HANDLE (MISCELLANEOUS) ×3 IMPLANT
DRAPE LAPAROSCOPIC ABDOMINAL (DRAPES) ×3 IMPLANT
DRAPE UTILITY 15X26 W/TAPE STR (DRAPE) ×6 IMPLANT
DRSG VAC ATS LRG SENSATRAC (GAUZE/BANDAGES/DRESSINGS) IMPLANT
DRSG VERSA FOAM LRG 10X15 (GAUZE/BANDAGES/DRESSINGS) IMPLANT
ELECT CAUTERY BLADE 6.4 (BLADE) ×3 IMPLANT
ELECT REM PT RETURN 9FT ADLT (ELECTROSURGICAL) ×3
ELECTRODE REM PT RTRN 9FT ADLT (ELECTROSURGICAL) ×1 IMPLANT
GLOVE BIOGEL PI IND STRL 8 (GLOVE) ×1 IMPLANT
GLOVE BIOGEL PI INDICATOR 8 (GLOVE) ×2
GLOVE ECLIPSE 7.5 STRL STRAW (GLOVE) ×3 IMPLANT
GOWN STRL REUS W/ TWL LRG LVL3 (GOWN DISPOSABLE) ×4 IMPLANT
GOWN STRL REUS W/TWL LRG LVL3 (GOWN DISPOSABLE) ×8
KIT BASIN OR (CUSTOM PROCEDURE TRAY) IMPLANT
KIT ROOM TURNOVER OR (KITS) IMPLANT
NS IRRIG 1000ML POUR BTL (IV SOLUTION) ×6 IMPLANT
PACK GENERAL/GYN (CUSTOM PROCEDURE TRAY) IMPLANT
PAD ARMBOARD 7.5X6 YLW CONV (MISCELLANEOUS) ×6 IMPLANT
PENCIL BUTTON HOLSTER BLD 10FT (ELECTRODE) ×3 IMPLANT
SPONGE ABDOMINAL VAC ABTHERA (MISCELLANEOUS) ×3 IMPLANT
SPONGE LAP 18X18 X RAY DECT (DISPOSABLE) ×3 IMPLANT
SUCTION POOLE TIP (SUCTIONS) ×3 IMPLANT
SUT SILK 2 0 SH CR/8 (SUTURE) ×3 IMPLANT
TOWEL OR 17X24 6PK STRL BLUE (TOWEL DISPOSABLE) IMPLANT
TOWEL OR 17X26 10 PK STRL BLUE (TOWEL DISPOSABLE) ×3 IMPLANT
TUBE CONNECTING 12'X1/4 (SUCTIONS) ×1
TUBE CONNECTING 12X1/4 (SUCTIONS) ×2 IMPLANT
WATER STERILE IRR 1000ML POUR (IV SOLUTION) IMPLANT
YANKAUER SUCT BULB TIP NO VENT (SUCTIONS) ×3 IMPLANT

## 2014-01-19 NOTE — Op Note (Signed)
OPERATIVE REPORT  DATE OF OPERATION: 02/02/2014  PATIENT:  [name] [name]  43 y.o. male  PRE-OPERATIVE DIAGNOSIS:  Open abdomen, VAC hematoma  POST-OPERATIVE DIAGNOSIS:  Same  PROCEDURE:  Procedure(s): ABDOMINAL VACUUM ASSISTED CLOSURE CHANGE  SURGEON:  Surgeon(s): Gwenyth Ober, MD  ASSISTANT: Maxwell Caul, PA-C  ANESTHESIA:   general  EBL: <20 ml  BLOOD ADMINISTERED: none  DRAINS: VAC dressing   SPECIMEN:  No Specimen  COUNTS CORRECT:  YES  PROCEDURE DETAILS: Because the patient was critically ill and hemodynamically unstable, his VAC dressing change was done at the bedside in the intensive care unit.  Under sterile condition his previously placed VAC dressing was removed. We spray the inner portion with Betadine. We removed the inner portion. There was no intra-abdominal bleeding although there had been a significant amount of bloody drainage previously. All of the bleeding appeared to be coming from the subcutaneous tissue and the fascia in a diffuse manner. There was no one site of significant bleeding. There was one small omental area that was bleeding that was suture ligated with 2-0 silk.  Once this was controlled the intra-abdominal negative pressure wound dressing was reapplied with success. The patient was given multiple units of blood prior to this procedure and have been chemically paralyzed for the procedure. All counts were correct. He remained in the intensive care unit in critical condition.  PATIENT DISPOSITION:  Remained in the ICU in critical condition   Gwenyth Ober April 22, 20153:09 PM

## 2014-01-19 NOTE — Progress Notes (Signed)
Vent changes made per MD order. RT will monitor.  

## 2014-01-19 NOTE — Progress Notes (Addendum)
PULMONARY / CRITICAL CARE MEDICINE   Name: [name] [name] MRN: [id number] DOB: 07/20/1971    ADMISSION DATE:  12/18/2013  PRIMARY SERVICE: PCCM  CHIEF COMPLAINT:  SOB, leg pain.  BRIEF PATIENT DESCRIPTION:  43 years old male resident of an assisted living facility with PMH relevant for schizoaffective disorder, polysubstance abuse, seizures, COPD,  malnutrition, GERD. Presented with SOB and found to be hypotensive despite 2 L of IVF's. Started on dopamine via peripheral IV by ED and  PCCM called to admit. No clear source of infection.   SIGNIFICANT EVENTS / STUDIES:  3/29 Admitted to PCCM with septic shock and HCAP. Resuscitated with IVFs, vasopressors. Intubated shortly after admission. Bicarbonate infusion initiated for severe metabolic acidosis 2/44 Persistent septic shock. Worsening renal function. Acidosis improved. HCO3 stopped 3/31 Off vasopressors. Severe agitation @ times. Fentanyl and dex infusions initiated. Not able to F/C. Oliguric with worsening renal indices. Foley cath changed out without improvement in Uo. No indication for HD at this time 4/1 Close to extubation, urine output slightly improved, will need to contact wife for discussion of goals of care 4/2 Still agitated, self-extubated, re-intubated successfully. Continued worsening renal function. No HCPA or family/significant other contact. Will consult with ethics committee to resume responsibility for [facility] decisions.  4/3 Renal function no worse. Hepatic function improving.  4/4 Started phenylephrine  4/5 Continued hypotension and oliguria. Unequal pupils. Severe metabolic acidosis, started on bicarb drip and CRRT. Started levophed. Started stress-steroids. Panculture for source of infection (likely abdominal). 4/6  Slowly improving acidosis on CRRT.  4/7 Improving acidosis, CT head w/o acute abnormality 4/6 CT abdo/pelvis>>>no offical read 4/6 CT chest>>> 4/7 Ischemic small bowel resection.  4/8 Bleeding with  thrombocytopenia    LINES / TUBES: L IJ CVL 3/29 >>  ETT 3/29 >> 4/2, 4/2 (self extubated and re-intubated) >>   HD RIJ catheter 4/5 >> A-line 4/5 >>  CULTURES: MRSA PCR 3/29 >> POS Urine 3/29 >> NG Blood 3/28 >> NG Blood 3/29 >> NG Blood 4/5 >>NGTD Urine 4/5 >>NG Tracheal 3/31 >> Gram stain neg,  NG C diff 4/3 >> Neg C diff 4/5 >> Neg Gi Pathogen 4/5>> Neg Tracheal 4/5 >>abundant yeast on GS, NGTD  ANTIBIOTICS: IV Vanc 3/29 >> 4/2; 4/6 >> Pip-tazo 3/29 >> 4/6 IV flagyl 4/5 >>4/7 PO vanc 4/6 >>4/7 IV micafungin 4/5 >> Meropenem 4/7 >>  SUBJECTIVE:  RASS -1. Pt sedated and not able to follow commands. High output of bloody drainage from wound vac.   VITAL SIGNS: Temp:  [94 F (34.4 C)-97.6 F (36.4 C)] 94.4 F (34.7 C) (04/08 0400) Pulse Rate:  [92-123] 98 (04/08 0700) Resp:  [9-34] 11 (04/08 0700) BP: (76-121)/(43-89) 94/54 mmHg (04/08 0700) SpO2:  [87 %-100 %] 96 % (04/08 0700) Arterial Line BP: (71-122)/(34-57) 98/43 mmHg (04/08 0700) FiO2 (%):  [50 %] 50 % (04/08 0600) Weight:  [68.1 kg (150 lb 2.1 oz)] 68.1 kg (150 lb 2.1 oz) (04/08 0545) HEMODYNAMICS: CVP:  [12 mmHg-13 mmHg] 13 mmHg VENTILATOR SETTINGS: Vent Mode:  [-] PRVC FiO2 (%):  [50 %] 50 % Set Rate:  [26 bmp] 26 bmp Vt Set:  [570 mL-600 mL] 570 mL PEEP:  [5 cmH20] 5 cmH20 Plateau Pressure:  [23 cmH20-26 cmH20] 26 cmH20 INTAKE / OUTPUT: Intake/Output     04/07 0701 - 04/08 0700 04/08 0701 - 04/09 0700   I.V. (mL/kg) 2222.8 (32.6)    Blood 2167    NG/GT 30    IV Piggyback 651  Total Intake(mL/kg) 5070.8 (74.5)    Urine (mL/kg/hr) 15 (0)    Emesis/NG output 150 (0.1)    Drains 5000 (3.1)    Other 2663 (1.6)    Total Output 7828     Net -2757.2            PHYSICAL EXAMINATION: General: critically ill, sedated  HEENT: WNL Cardiac: RRR  Lungs: Clear to ausculation in anterior fields   Abdomen: hard, distended, no BS, grimacing with palpation, wound vac  Ext: SCD's in place, cool,  +3-4 edema up to abdomen GU: scrotal swelling Neuro: Unequal pupil  5 mm L>3 mm RASS -3 to -1  PULMONARY  Recent Labs Lab 01/17/14 0529 01/17/14 1258 01/17/14 1646 01/17/14 2007 02/02/2014 0327  PHART 7.185* 7.269* 7.211* 7.287* 7.326*  PCO2ART 32.2* 38.4 26.7* 41.6 48.3*  PO2ART 70.0* 64.0* 74.0* 61.0* 66.6*  HCO3 12.5* 17.7* 10.8* 20.0 24.7*  TCO2 14 19 12 21  26.2  O2SAT 92.0 90.0 92.0 89.0 94.8    CBC  Recent Labs Lab 01/13/2014 2100 01/17/2014 2320 01/27/2014 0430  HGB 9.7* 9.4* 8.5*  HCT 28.0* 26.9* 23.8*  WBC 32.6* 32.0* 32.7*  PLT 36* 31* 26*  29*    COAGULATION  Recent Labs Lab 01/16/14 2030 01/27/2014 0430 01/17/2014 1330 02/06/2014 0430  INR 2.50* 2.28* 1.91* 1.92*     CHEMISTRY  Recent Labs Lab 01/16/14 1340 01/16/14 2030 01/17/14 0447 01/17/14 1030 01/17/14 1600 02/02/2014 0430 01/12/2014 1600 01/30/2014 0430  NA 137 133* 137 137 139 136* 137 137  K 4.6 4.2 3.6* 3.9 3.4* 3.9 3.8 4.0  CL 95* 91* 92* 94* 102 97 97 103  CO2 8* 9* 12* 16* 14* 24 22 26   GLUCOSE 231* 198* 232* 213* 153* 124* 157* 138*  BUN 58* 54* 34* 27* 21 14 9 7   CREATININE 5.08* 4.98* 3.08* 2.31* 1.78* 1.20 0.92 0.67  CALCIUM 6.9* 6.7* 6.8* 6.9* 5.4* 6.5* 6.7* 6.7*  MG 2.5 2.6* 2.2  --   --  2.0  --  2.0  PHOS  --  7.5* 3.8  --  2.3 1.5* 3.0 1.3*   Estimated Creatinine Clearance: 114.7 ml/min (by C-G formula based on Cr of 0.67).   LIVER  Recent Labs Lab 01/16/14 1340 01/16/14 2030  01/17/14 1030 01/17/14 1600 01/31/2014 0430 01/30/2014 1330 02/08/2014 1600 02/06/2014 0430  AST 805* 852*  --  693*  --  351*  --   --  171*  ALT 560* 578*  --  669*  --  495*  --   --  252*  ALKPHOS 133* 143*  --  165*  --  138*  --   --  111  BILITOT 0.7 0.9  --  1.6*  --  1.8*  --   --  4.5*  PROT 4.7* 4.8*  --  5.1*  --  4.3*  --   --  4.1*  ALBUMIN 1.2* 1.3*  < > 1.3* 1.0* 1.2*  --  2.1* 1.6*  1.5*  INR  --  2.50*  --   --   --  2.28* 1.91*  --  1.92*  < > = values in this interval not  displayed.   INFECTIOUS  Recent Labs Lab 01/15/14 1433 01/16/14 0500  01/17/14 0447 01/17/14 0755 01/14/2014 0430 02/01/2014 0430  LATICACIDVEN  --   --   < >  --  10.8* 5.9* 2.6*  PROCALCITON 2.43 3.50  --  2.66  --   --   --   < > =  values in this interval not displayed.   ENDOCRINE CBG (last 3)   Recent Labs  01/27/2014 2344 Feb 02, 2014 0401 02-Feb-2014 0429  GLUCAP 98 64* 129*     IMAGING x48h  Ct Head Wo Contrast  01/17/2014   CLINICAL DATA:  Patient in coma  EXAM: CT HEAD WITHOUT CONTRAST  TECHNIQUE: Contiguous axial images were obtained from the base of the skull through the vertex without intravenous contrast.  COMPARISON:  CT TEMPORAL BONES W/O CM dated 10/29/2013; CT HEAD W/O CM dated 03/01/2013  FINDINGS: There is no evidence of mass effect, midline shift or extra-axial fluid collections. There is no evidence of a space-occupying lesion or intracranial hemorrhage. There is bilateral cerebellar encephalomalacia, right greater than left. There is no evidence of a cortical-based area of acute infarction.  The ventricles and sulci are appropriate for the patient's age. The basal cisterns are patent.  Visualized portions of the orbits are unremarkable. There is opacification of bilateral mastoid air cells unchanged from recent CT of the temporal bones.  There is a suboccipital craniectomy.  There is contrast in the left ethmoid sinus, left maxillary sinus, sphenoid sinus and nasopharynx likely reflecting reflux of oral contrast from within the stomach.  IMPRESSION: No acute intracranial pathology.  Contrast in the left ethmoid sinus, left maxillary sinus, sphenoid sinus and nasopharynx likely reflecting reflux of oral contrast from within the stomach.  Mastoiditis unchanged from 10/29/2013.   Electronically Signed   By: Kathreen Devoid   On: 01/17/2014 16:19   Dg Chest Port 1 View  01/16/2014   CLINICAL DATA:  Edema, pneumonia  EXAM: PORTABLE CHEST - 1 VIEW  COMPARISON:  Portable exam 1245 hr  compared to 01/17/2014  FINDINGS: Tip of endotracheal tube projects 6.3 cm above carina.  Bilateral subclavian lines with tips projecting over proximal SVC.  Nasogastric tube extends into stomach, tip projecting over pylorus.  Normal heart size and mediastinal contours.  Bilateral pulmonary infiltrates greatest in left mid lung and at right base, slightly increased.  Small right pleural effusion, increased.  Underlying COPD.  No pneumothorax.  IMPRESSION: Bilateral pulmonary infiltrates, slightly increased.  Small right pleural effusion, increased.   Electronically Signed   By: Lavonia Dana M.D.   On: 01/14/2014 07:59     ASSESSMENT / PLAN:  PULMONARY A: Ventilator Dependent Respiratory Failure - secondary to HCAP (?)    - cocern for ARDS Pulmonary Edema with b/l effusions  - 2D- echo with no CHF  COPD - emphysema P:   TABG now, if able to lower MV, will consider SBT if pressors improve ARDS protocol Albuterol neb and duoneb PRN, limit when we can in ards pcxr in am , follow rt effusion  CARDIOVASCULAR A:  Septic shock   - still on pressor support R/o burn out adrenal P:  -Levophed started 4/5 to maintain MAP > 60 mm Hg -F/U AM cortisol pending -avoid vaso with dead bowel and mesenteric ischemia risk cvp re assessment as has had some insensible losses  RENAL A:   Acute kidney failure, oligo-anuric -  likely due to ATN (severe sepsis)     -Cr wnl, still oliguric on CRRT Anion Gap Metabolic Acidosis due to lactic acidosis - resolved  Hypokalemia  Hypocalcemia Hypophosphatemia P:   Renal following Continue CRRT Avoid nephrotoxins Na - phos Consider dc foley , consider straight cath  GASTROINTESTINAL A:   Ischemic Small Bowel s/p resection   - lactic acid improving H/O GERD, chronic PPI use Acute liver failure  due to severe sepsis Mild pancreatitis - resolved P:   Surgery following NPO, bowel rest Fentanyl infusion pain Hold tube feedings osmolite and pro-stat  initiated 3/31 Back to OR for wound vac change Avoid TPN  HEMATOLOGIC A:   Thrombocytopenia    - active bleeding, most likely due to liver dysfunction sepsis, not DIC (factor 8 wnl) Normocytic Anemia     - likely due to severe sepsis  Coagulapathy   - most likely due to liver dysfunction P:  F/U HIT panel , unlikely  Prepare platelet for transfusion, ffp, prbc for OR Hold heparin Monitor CBC in pm  SCD's   INFECTIOUS A:   Severe sepsis      -small bowel ischemic bowel s/p resection Leukocytosis   RLL HCAP, NOS P:   Cont meropenem, IV vancomycin Cont IV micafungin - no perf noted, dc F/U repeat BC, RC  Pending HIV  ENDOCRINE A:   Assess for  Adrenal Insufficiency Hypothyroidism   - TSH wnl Hypoglycemia, liver, sepsis P:   Cont L-thyroxine  SSI with CBG Q 4 hr D50 as needed for CBG <70 F/U AM cortisol level  NEUROLOGIC/PSYCHOSOCIAL A:   Anisocoria at baseline? Acute encephalopathy with severe agitation Chronic b/l Cerebellar Encephalomalacia  Schizoaffective disorder Seizure disorder No HCPOA identified P:   Fentanyl infusion and PRN pain  Continue restraints  Hold Remeron, Zyprexa, Cogentin  IV phenytoin per pharmacy, consider restart  Marjan Rabbani PGY1- IMTS  01/12/2014 8:14 AM  Ccm time 40 min  I have fully examined this patient and agree with above findings.    And edited in full  Lavon Paganini. Titus Mould, [place] Pgr: [facility]   Additional time  Worsening shock, No change in abdo exam  Concern worsening bleeding? STAT 2 units prbc Stat 1 unit plat STAT abg, lactic D/w surg, consider to do vac change in  room Has HD cath can use on pressure bag  Ccm time  Total  90 min   Lavon Paganini. Titus Mould, MD, [place] Pgr: [facility]

## 2014-01-19 NOTE — Anesthesia Postprocedure Evaluation (Signed)
  Anesthesia Post-op Note  Patient: [name] [name]  Procedure(s) Performed: Procedure(s): EXPLORATORY LAPAROTOMY (N/A) SMALL BOWEL RESECTION (N/A) APPLICATION OF WOUND VAC (N/A)  Patient Location: ICU  Anesthesia Type:General  Level of Consciousness: Patient remains intubated per anesthesia plan  Airway and Oxygen Therapy: Patient remains intubated per anesthesia plan and Patient placed on Ventilator (see vital sign flow sheet for setting)  Post-op Pain: none  Post-op Assessment: Post-op Vital signs reviewed, Patient's Cardiovascular Status Stable, Respiratory Function Stable and Patent Airway  Post-op Vital Signs: Reviewed and stable  Last Vitals:  Filed Vitals:   02/07/2014 2000  BP:   Pulse: 74  Temp:   Resp: 28    Complications: No apparent anesthesia complications

## 2014-01-19 NOTE — Progress Notes (Signed)
[facility] Progress Note 1 Day Post-Op  Subjective: Patient still somewhat awake on ventilator, but less pressors today.  Has a significant accumulation of blood underneath VAC.  Will need to change today in the OR.  Objective: Vital signs in last 24 hours: Temp:  [94 F (34.4 C)-97.6 F (36.4 C)] 97 F (36.1 C) (04/08 0858) Pulse Rate:  [92-123] 98 (04/08 0849) Resp:  [9-34] 26 (04/08 0849) BP: (76-121)/(43-89) 104/57 mmHg (04/08 0849) SpO2:  [87 %-100 %] 98 % (04/08 0849) Arterial Line BP: (71-122)/(34-57) 98/43 mmHg (04/08 0700) FiO2 (%):  [50 %] 50 % (04/08 0849) Weight:  [68.1 kg (150 lb 2.1 oz)] 68.1 kg (150 lb 2.1 oz) (04/08 0545) Last BM Date: 02/05/2014  Intake/Output from previous day: 04/07 0701 - 04/08 0700 In: 5070.8 [I.V.:2222.8; LZJQB:3419; NG/GT:30; IV Piggyback:651] Out: 7828 [Urine:15; Emesis/NG output:150; Drains:5000] Intake/Output this shift: Total I/O In: 238 [I.V.:195; IV Piggyback:43] Out: 379 [Drains:500; Other:127]  General: Tender when abdomen is palpated.  Lungs: Clear  Abd: Wound VAC as described.  Extremities: No changes  Neuro: Intact  Lab Results:  @LABLAST2 (wbc:2,hgb:2,hct:2,plt:2) BMET  Recent Labs  02/09/2014 1600 01/18/2014 0430  NA 137  138 137  K 3.8  3.7 4.0  CL 97 103  CO2 22 26  GLUCOSE 157* 138*  BUN 9 7  CREATININE 0.92 0.67  CALCIUM 6.7* 6.7*   PT/INR  Recent Labs  01/17/2014 1330 02/01/2014 0430  LABPROT 21.3* 21.4*  INR 1.91* 1.92*   ABG  Recent Labs  02/10/2014 0327 02/09/2014 1600  PHART 7.326* 7.312*  HCO3 24.7* 26.2*    Studies/Results: Ct Head Wo Contrast  01/17/2014   CLINICAL DATA:  Patient in coma  EXAM: CT HEAD WITHOUT CONTRAST  TECHNIQUE: Contiguous axial images were obtained from the base of the skull through the vertex without intravenous contrast.  COMPARISON:  CT TEMPORAL BONES W/O CM dated 10/29/2013; CT HEAD W/O CM dated 03/01/2013  FINDINGS: There is no evidence of mass effect, midline shift or  extra-axial fluid collections. There is no evidence of a space-occupying lesion or intracranial hemorrhage. There is bilateral cerebellar encephalomalacia, right greater than left. There is no evidence of a cortical-based area of acute infarction.  The ventricles and sulci are appropriate for the patient's age. The basal cisterns are patent.  Visualized portions of the orbits are unremarkable. There is opacification of bilateral mastoid air cells unchanged from recent CT of the temporal bones.  There is a suboccipital craniectomy.  There is contrast in the left ethmoid sinus, left maxillary sinus, sphenoid sinus and nasopharynx likely reflecting reflux of oral contrast from within the stomach.  IMPRESSION: No acute intracranial pathology.  Contrast in the left ethmoid sinus, left maxillary sinus, sphenoid sinus and nasopharynx likely reflecting reflux of oral contrast from within the stomach.  Mastoiditis unchanged from 10/29/2013.   Electronically Signed   By: Kathreen Devoid   On: 01/17/2014 16:19   Dg Chest Port 1 View  02/03/2014   CLINICAL DATA:  Edema, pneumonia  EXAM: PORTABLE CHEST - 1 VIEW  COMPARISON:  Portable exam 0240 hr compared to 01/17/2014  FINDINGS: Tip of endotracheal tube projects 6.3 cm above carina.  Bilateral subclavian lines with tips projecting over proximal SVC.  Nasogastric tube extends into stomach, tip projecting over pylorus.  Normal heart size and mediastinal contours.  Bilateral pulmonary infiltrates greatest in left mid lung and at right base, slightly increased.  Small right pleural effusion, increased.  Underlying COPD.  No pneumothorax.  IMPRESSION:  Bilateral pulmonary infiltrates, slightly increased.  Small right pleural effusion, increased.   Electronically Signed   By: Lavonia Dana M.D.   On: 01/20/2014 07:59    Anti-infectives: Anti-infectives   Start     Dose/Rate Route Frequency Ordered Stop   02/10/2014 0000  meropenem (MERREM) 1 g in sodium chloride 0.9 % 100 mL IVPB      1 g 200 mL/hr over 30 Minutes Intravenous Every 12 hours 01/17/14 1405     01/17/14 1300  vancomycin (VANCOCIN) IVPB 750 mg/150 ml premix     750 mg 150 mL/hr over 60 Minutes Intravenous Every 24 hours 01/17/14 1211     01/17/14 1000  meropenem (MERREM) 2 g in sodium chloride 0.9 % 100 mL IVPB  Status:  Discontinued     2 g 200 mL/hr over 30 Minutes Intravenous Every 18 hours 01/17/14 0913 01/17/14 1405   01/17/14 0000  vancomycin (VANCOCIN) 50 mg/mL oral solution 500 mg  Status:  Discontinued     500 mg Oral 4 times per day 01/16/14 2008 02/07/2014 0952   01/17/14 0000  piperacillin-tazobactam (ZOSYN) IVPB 3.375 g  Status:  Discontinued     3.375 g 100 mL/hr over 30 Minutes Intravenous 4 times per day 01/16/14 2202 01/17/14 0854   01/16/14 2200  metroNIDAZOLE (FLAGYL) tablet 500 mg  Status:  Discontinued     500 mg Oral 3 times per day 01/16/14 1905 01/16/14 2002   01/16/14 2015  metroNIDAZOLE (FLAGYL) IVPB 500 mg  Status:  Discontinued     500 mg 100 mL/hr over 60 Minutes Intravenous Every 8 hours 01/16/14 2002 02/04/2014 1001   01/16/14 2000  vancomycin (VANCOCIN) 50 mg/mL oral solution 250 mg  Status:  Discontinued     250 mg Oral 4 times per day 01/16/14 1957 01/16/14 2008   01/16/14 2000  micafungin (MYCAMINE) 100 mg in sodium chloride 0.9 % 100 mL IVPB     100 mg 100 mL/hr over 1 Hours Intravenous Daily 01/16/14 1957     01/16/14 1800  piperacillin-tazobactam (ZOSYN) IVPB 3.375 g  Status:  Discontinued     3.375 g 12.5 mL/hr over 240 Minutes Intravenous 4 times per day 01/16/14 1648 01/16/14 2202   01/12/14 1200  piperacillin-tazobactam (ZOSYN) IVPB 2.25 g  Status:  Discontinued     2.25 g 100 mL/hr over 30 Minutes Intravenous 4 times per day 01/12/14 0844 01/16/14 1648   01/12/14 0500  vancomycin (VANCOCIN) 500 mg in sodium chloride 0.9 % 100 mL IVPB  Status:  Discontinued     500 mg 100 mL/hr over 60 Minutes Intravenous Every 24 hours 01/11/14 0902 01/12/14 1242   01/10/14  0400  vancomycin (VANCOCIN) IVPB 750 mg/150 ml premix  Status:  Discontinued     750 mg 150 mL/hr over 60 Minutes Intravenous Every 24 hours 01/09/14 0353 01/11/14 0902   01/09/14 1200  piperacillin-tazobactam (ZOSYN) IVPB 3.375 g  Status:  Discontinued     3.375 g 12.5 mL/hr over 240 Minutes Intravenous Every 8 hours 01/09/14 0353 01/12/14 0844   01/09/14 0330  piperacillin-tazobactam (ZOSYN) IVPB 3.375 g     3.375 g 100 mL/hr over 30 Minutes Intravenous  Once 01/09/14 0329 01/09/14 0358   01/09/14 0330  vancomycin (VANCOCIN) IVPB 1000 mg/200 mL premix     1,000 mg 200 mL/hr over 60 Minutes Intravenous  Once 01/09/14 0329 01/09/14 0437      Assessment/Plan: s/p Procedure(s): EXPLORATORY LAPAROTOMY SMALL BOWEL RESECTION APPLICATION OF WOUND VAC Back to  the OR for VAC change.   Will need a good numbe of blood products prior to going back to the OR.  LOS: 11 days   Kathryne Eriksson. Dahlia Bailiff, MD, FACS [phone] [facility] 02/03/2014

## 2014-01-19 NOTE — Progress Notes (Signed)
Subjective: Interval History: has no complaint, sedated on vent, but moves extrem.  Objective: Vital signs in last 24 hours: Temp:  [94 F (34.4 C)-97.6 F (36.4 C)] 94.4 F (34.7 C) (04/08 0400) Pulse Rate:  [95-123] 96 (04/08 0615) Resp:  [9-34] 23 (04/08 0615) BP: (76-121)/(43-89) 100/49 mmHg (04/08 0600) SpO2:  [87 %-100 %] 100 % (04/08 0615) Arterial Line BP: (71-122)/(34-57) 104/43 mmHg (04/08 0615) FiO2 (%):  [50 %] 50 % (04/08 0600) Weight:  [68.1 kg (150 lb 2.1 oz)] 68.1 kg (150 lb 2.1 oz) (04/08 0545) Weight change: -4.4 kg (-9 lb 11.2 oz)  Intake/Output from previous day: 04/07 0701 - 04/08 0700 In: 4973.3 [I.V.:2125.3; ZOXWR:6045; NG/GT:30; IV Piggyback:651] Out: 4098 [Urine:15; Emesis/NG output:150; Drains:4500] Intake/Output this shift:    General appearance: uncooperative and eyes open, moves extrem Neck: R IJ cath Resp: rhonchi bilaterally Cardio: S1, S2 normal GI: no bs, distended Extremities: edema 3-4+  Lab Results:  Recent Labs  01/16/2014 2320 02/02/2014 0430  WBC 32.0* 32.7*  HGB 9.4* 8.5*  HCT 26.9* 23.8*  PLT 31* 26*  29*   BMET:  Recent Labs  01/12/2014 1600 02/09/2014 0430  NA 137 137  K 3.8 4.0  CL 97 103  CO2 22 26  GLUCOSE 157* 138*  BUN 9 7  CREATININE 0.92 0.67  CALCIUM 6.7* 6.7*   No results found for this basename: PTH,  in the last 72 hours Iron Studies: No results found for this basename: IRON, TIBC, TRANSFERRIN, FERRITIN,  in the last 72 hours  Studies/Results: Ct Head Wo Contrast  01/17/2014   CLINICAL DATA:  Patient in coma  EXAM: CT HEAD WITHOUT CONTRAST  TECHNIQUE: Contiguous axial images were obtained from the base of the skull through the vertex without intravenous contrast.  COMPARISON:  CT TEMPORAL BONES W/O CM dated 10/29/2013; CT HEAD W/O CM dated 03/01/2013  FINDINGS: There is no evidence of mass effect, midline shift or extra-axial fluid collections. There is no evidence of a space-occupying lesion or intracranial  hemorrhage. There is bilateral cerebellar encephalomalacia, right greater than left. There is no evidence of a cortical-based area of acute infarction.  The ventricles and sulci are appropriate for the patient's age. The basal cisterns are patent.  Visualized portions of the orbits are unremarkable. There is opacification of bilateral mastoid air cells unchanged from recent CT of the temporal bones.  There is a suboccipital craniectomy.  There is contrast in the left ethmoid sinus, left maxillary sinus, sphenoid sinus and nasopharynx likely reflecting reflux of oral contrast from within the stomach.  IMPRESSION: No acute intracranial pathology.  Contrast in the left ethmoid sinus, left maxillary sinus, sphenoid sinus and nasopharynx likely reflecting reflux of oral contrast from within the stomach.  Mastoiditis unchanged from 10/29/2013.   Electronically Signed   By: Kathreen Devoid   On: 01/17/2014 16:19   Dg Chest Port 1 View  01/27/2014   CLINICAL DATA:  Edema, pneumonia  EXAM: PORTABLE CHEST - 1 VIEW  COMPARISON:  Portable exam 1191 hr compared to 01/17/2014  FINDINGS: Tip of endotracheal tube projects 6.3 cm above carina.  Bilateral subclavian lines with tips projecting over proximal SVC.  Nasogastric tube extends into stomach, tip projecting over pylorus.  Normal heart size and mediastinal contours.  Bilateral pulmonary infiltrates greatest in left mid lung and at right base, slightly increased.  Small right pleural effusion, increased.  Underlying COPD.  No pneumothorax.  IMPRESSION: Bilateral pulmonary infiltrates, slightly increased.  Small right pleural effusion,  increased.   Electronically Signed   By: Lavonia Dana M.D.   On: 01/15/2014 07:59    I have reviewed the patient's current medications.  Assessment/Plan: 1 AKI solute clearance ok..  Acid/base ok. K clearance ok.  Vol xs but signif pressor requirement. will allow neg with vac drainage, otherwise even.  If pressors ^, replete.  Can lower DFR 2  Anemia bleeding, renal , getting components 3 low phos CRRT, replete 4 Sepsis Meropenum, Micafungin, Vanc 5 Hypothyroid 6 SB resx, ischemic 7 Nutrit may need TPN soon 8Schizoaffective 9 Hx Craniotomy 10 Polysubstance abuse 12 VDRF P lower DFR, Phos, follow k, allow some neg with drainage, follow pressor requirement., Vent, AB   LOS: 11 days   [name] [name] 01/29/2014,7:01 AM

## 2014-01-19 NOTE — Progress Notes (Signed)
Per Dr Deterding, leave HD CATHE without any heparin. Due to pharmacy refusing to dispence heparin for catheter patency

## 2014-01-19 NOTE — Progress Notes (Signed)
MEDICATION RELATED CONSULT NOTE - FOLLOW UP  Pharmacy Consult for Phenytoin Indication: Hx seizures  Allergies  Allergen Reactions  . Fexofenadine Hcl Other (See Comments)    unknown  . Percocet [Oxycodone-Acetaminophen] Other (See Comments)    unknown  . Thorazine [Chlorpromazine Hcl] Other (See Comments)    unknown    Patient Measurements: Height: 6\' 2"  (188 cm) Weight: 150 lb 2.1 oz (68.1 kg) IBW/kg (Calculated) : 82.2 Vital Signs: Temp: 95.7 F (35.4 C) (04/08 1125) Temp src: Axillary (04/08 1125) BP: 98/60 mmHg (04/08 1100) Pulse Rate: 101 (04/08 1125) Intake/Output from previous day: 04/07 0701 - 04/08 0700 In: 5070.8 [I.V.:2222.8; OIZTI:4580; NG/GT:30; IV Piggyback:651] Out: 7828 [Urine:15; Emesis/NG output:150; Drains:5000] Intake/Output from this shift: Total I/O In: 726 [I.V.:292.5; Blood:347.5; IV Piggyback:86] Out: 9983 [Drains:1000; Other:341]  Labs:  Recent Labs  01/17/14 0447 01/17/14 1030  01/12/2014 0430 02/04/2014 1600  01/31/2014 2100 01/12/2014 2320 01/29/2014 0430  WBC 50.6* 41.8*  --  41.0*  --   < > 32.6* 32.0* 32.7*  HGB 8.6* 8.9*  --  7.5* 7.8*  < > 9.7* 9.4* 8.5*  HCT 26.3* 26.5*  --  21.8* 23.0*  < > 28.0* 26.9* 23.8*  PLT 176 141*  --  PLATELET CLUMPS NOTED ON SMEAR, COUNT APPEARS INCREASED  --   < > 36* 31* 26*  29*  APTT 129*  --   --  194*  --   --   --   --  62*  CREATININE 3.08* 2.31*  < > 1.20 0.92  --   --   --  0.67  MG 2.2  --   --  2.0  --   --   --   --  2.0  PHOS 3.8  --   < > 1.5* 3.0  --   --   --  1.3*  ALBUMIN 1.4* 1.3*  < > 1.2* 2.1*  --   --   --  1.6*  1.5*  PROT  --  5.1*  --  4.3*  --   --   --   --  4.1*  AST  --  693*  --  351*  --   --   --   --  171*  ALT  --  669*  --  495*  --   --   --   --  252*  ALKPHOS  --  165*  --  138*  --   --   --   --  111  BILITOT  --  1.6*  --  1.8*  --   --   --   --  4.5*  BILIDIR  --  1.3*  --   --   --   --   --   --  3.9*  IBILI  --  0.3  --   --   --   --   --   --  0.6  < >  = values in this interval not displayed. Estimated Creatinine Clearance: 114.7 ml/min (by C-G formula based on Cr of 0.67).   Microbiology: Recent Results (from the past 720 hour(s))  CULTURE, BLOOD (ROUTINE X 2)     Status: None   Collection Time    12/22/2013 11:58 PM      Result Value Ref Range Status   Specimen Description BLOOD WRIST RIGHT   Final   Special Requests BOTTLES DRAWN AEROBIC AND ANAEROBIC 5CC EA   Final   Culture  Setup Time  Final   Value: 01/09/2014 03:47     Performed at [facility]   Culture     Final   Value: NO GROWTH 5 DAYS     Performed at [facility]   Report Status 01/15/2014 FINAL   Final  CULTURE, BLOOD (ROUTINE X 2)     Status: None   Collection Time    01/09/14 12:24 AM      Result Value Ref Range Status   Specimen Description BLOOD LEFT ARM   Final   Special Requests BOTTLES DRAWN AEROBIC ONLY 10CC   Final   Culture  Setup Time     Final   Value: 01/09/2014 03:47     Performed at [facility]   Culture     Final   Value: NO GROWTH 5 DAYS     Performed at [facility]   Report Status 01/15/2014 FINAL   Final  URINE CULTURE     Status: None   Collection Time    01/09/14  2:42 AM      Result Value Ref Range Status   Specimen Description URINE, CATHETERIZED   Final   Special Requests ADD [id number]   Final   Culture  Setup Time     Final   Value: 01/09/2014 14:10     Performed at [facility]     Final   Value: NO GROWTH     Performed at [facility]   Culture     Final   Value: NO GROWTH     Performed at [facility]   Report Status 01/10/2014 FINAL   Final  MRSA PCR SCREENING     Status: Abnormal   Collection Time    01/09/14  4:50 AM      Result Value Ref Range Status   MRSA by PCR POSITIVE (*) NEGATIVE Final   Comment:            The GeneXpert MRSA Assay (FDA     approved for NASAL specimens     only), is one component of a     comprehensive MRSA  colonization     surveillance program. It is not     intended to diagnose MRSA     infection nor to guide or     monitor treatment for     MRSA infections.     RESULT CALLED TO, READ BACK BY AND VERIFIED WITH:     MACK,B RN [phone] 01/09/14 MITCHELL,L  CULTURE, RESPIRATORY (NON-EXPECTORATED)     Status: None   Collection Time    01/11/14 11:53 AM      Result Value Ref Range Status   Specimen Description TRACHEAL ASPIRATE   Final   Special Requests NONE   Final   Gram Stain     Final   Value: NO WBC SEEN     NO SQUAMOUS EPITHELIAL CELLS SEEN     NO ORGANISMS SEEN     Performed at [facility]   Culture     Final   Value: NO GROWTH 2 DAYS     Performed at [facility]   Report Status 01/13/2014 FINAL   Final  CLOSTRIDIUM DIFFICILE BY PCR     Status: None   Collection Time    01/14/14  2:17 PM      Result Value Ref Range Status   C difficile by pcr NEGATIVE  NEGATIVE Final  CULTURE, BLOOD (ROUTINE  X 2)     Status: None   Collection Time    01/16/14  8:53 PM      Result Value Ref Range Status   Specimen Description BLOOD RIGHT HAND   Final   Special Requests BOTTLES DRAWN AEROBIC ONLY 5CC   Final   Culture  Setup Time     Final   Value: 01/17/2014 02:22     Performed at [facility]   Culture     Final   Value:        BLOOD CULTURE RECEIVED NO GROWTH TO DATE CULTURE WILL BE HELD FOR 5 DAYS BEFORE ISSUING A FINAL NEGATIVE REPORT     Performed at [facility]   Report Status PENDING   Incomplete  CULTURE, BLOOD (ROUTINE X 2)     Status: None   Collection Time    01/16/14  9:00 PM      Result Value Ref Range Status   Specimen Description BLOOD RIGHT HAND   Final   Special Requests BOTTLES DRAWN AEROBIC ONLY 3CC   Final   Culture  Setup Time     Final   Value: 01/17/2014 02:22     Performed at [facility]   Culture     Final   Value:        BLOOD CULTURE RECEIVED NO GROWTH TO DATE CULTURE WILL BE HELD FOR 5 DAYS BEFORE ISSUING A FINAL  NEGATIVE REPORT     Performed at [facility]   Report Status PENDING   Incomplete  URINE CULTURE     Status: None   Collection Time    01/16/14  9:50 PM      Result Value Ref Range Status   Specimen Description URINE, RANDOM   Final   Special Requests NONE   Final   Culture  Setup Time     Final   Value: 01/16/2014 22:19     Performed at [facility]     Final   Value: NO GROWTH     Performed at [facility]   Culture     Final   Value: NO GROWTH     Performed at [facility]   Report Status 01/17/2014 FINAL   Final  CLOSTRIDIUM DIFFICILE BY PCR     Status: None   Collection Time    01/16/14  9:57 PM      Result Value Ref Range Status   C difficile by pcr NEGATIVE  NEGATIVE Final  CULTURE, RESPIRATORY (NON-EXPECTORATED)     Status: None   Collection Time    01/16/14  9:59 PM      Result Value Ref Range Status   Specimen Description TRACHEAL ASPIRATE   Final   Special Requests NONE   Final   Gram Stain     Final   Value: FEW WBC PRESENT,BOTH PMN AND MONONUCLEAR     NO SQUAMOUS EPITHELIAL CELLS SEEN     ABUNDANT YEAST     Performed at [facility]   Culture     Final   Value: MODERATE CANDIDA ALBICANS     Performed at [facility]   Report Status 01/25/2014 FINAL   Final    Assessment: Patient on phenytoin PTA for hx of seizures. Patient with AKI and shocked liver currently on CRRT. No current seizure activity.   DPH 300 QHS PTA (~4.7mg /kg/d) >> 100mg  IV Q8H on 3/30 >> holding since 4/1.  3/29 level 7.9 >> corr 12.7 4/1 level 12.8 >> corr 28.87 (no reported nystagmus)  4/2 level 9.3 >> corr 21.1 4/3 level 8.5>> corr 21.2  4/5 level 5.3>> corr 15.5 with new alb 1.2 >> currently clearing 3mg /hr 4/7 level 5.3>> corr 15.5 (doesn't seem to be clearing) 4/8 level 4.0>>corr 10  Goal of Therapy:  Phenytoin goal of 10-20 ug/ml  Plan:   - Restart phenytoin at reduced dose 50 mg IV q12h - Continue to  monitor liver function, s/s toxicity, levels prn   Harolyn Rutherford, PharmD Clinical Pharmacist - Resident Pager: [phone] Pharmacy: [phone] 01/27/2014 11:35 AM

## 2014-01-19 NOTE — Progress Notes (Signed)
Vent changes made per MD order. RT will monitor.

## 2014-01-19 NOTE — Progress Notes (Signed)
No SBT/wean done due to pt on pressors. No complications noted. RT will monitor.

## 2014-01-19 NOTE — Anesthesia Preprocedure Evaluation (Deleted)
Anesthesia Evaluation  Patient identified by MRN, date of birth, ID band Patient unresponsive    Reviewed: Allergy & Precautions, H&P , NPO status , Patient's Chart, lab work & pertinent test results, reviewed documented beta blocker date and time , Unable to perform ROS - Chart review only  Airway Mallampati: II TM Distance: >3 FB Neck ROM: full    Dental   Pulmonary neg pulmonary ROS, shortness of breath, asthma , pneumonia -, unresolved, COPDCurrent Smoker,  + rhonchi   + decreased breath sounds  + intubated    Cardiovascular negative cardio ROS  Rhythm:regular     Neuro/Psych Seizures -,  PSYCHIATRIC DISORDERS Anxiety negative neurological ROS  negative psych ROS   GI/Hepatic negative GI ROS, Neg liver ROS, GERD-  Medicated,  Endo/Other  negative endocrine ROSHypothyroidism   Renal/GU ARFRenal diseasenegative Renal ROS  negative genitourinary   Musculoskeletal   Abdominal   Peds  Hematology negative hematology ROS (+) anemia ,   Anesthesia Other Findings See surgeon's H&P   Reproductive/Obstetrics negative OB ROS                           Anesthesia Physical Anesthesia Plan  ASA: IV  Anesthesia Plan: General   Post-op Pain Management:    Induction: Intravenous  Airway Management Planned: Oral ETT  Additional Equipment:   Intra-op Plan:   Post-operative Plan: Post-operative intubation/ventilation  Informed Consent: I have reviewed the patients History and Physical, chart, labs and discussed the procedure including the risks, benefits and alternatives for the proposed anesthesia with the patient or authorized representative who has indicated his/her understanding and acceptance.   History available from chart only  Plan Discussed with: CRNA and Surgeon  Anesthesia Plan Comments:         Anesthesia Quick Evaluation

## 2014-01-19 NOTE — Progress Notes (Signed)
CRITICAL VALUE ALERT  Critical value received:  Platelets 26   Date of notification:  01/27/2014   Time of notification:  5498  Critical value read back:yes  Nurse who received alert:  Wynetta Fines RN  MD notified (1st page):  Dr. Wanda Plump resident)  Time of first page:  NA- MD on unit. Reported at 0600  MD notified (2nd page):  Time of second page:  Responding MD:  Dr. Nicoletta Dress   Time MD responded:  0600

## 2014-01-20 ENCOUNTER — Inpatient Hospital Stay (HOSPITAL_COMMUNITY): Payer: Medicare Other

## 2014-01-20 ENCOUNTER — Encounter (HOSPITAL_COMMUNITY): Payer: Self-pay | Admitting: General Surgery

## 2014-01-20 DIAGNOSIS — R0602 Shortness of breath: Secondary | ICD-10-CM | POA: Diagnosis not present

## 2014-01-20 DIAGNOSIS — A4102 Sepsis due to Methicillin resistant Staphylococcus aureus: Secondary | ICD-10-CM | POA: Diagnosis not present

## 2014-01-20 LAB — GLUCOSE, CAPILLARY
GLUCOSE-CAPILLARY: 82 mg/dL (ref 70–99)
GLUCOSE-CAPILLARY: 109 mg/dL — AB (ref 70–99)
Glucose-Capillary: 107 mg/dL — ABNORMAL HIGH (ref 70–99)
Glucose-Capillary: 107 mg/dL — ABNORMAL HIGH (ref 70–99)
Glucose-Capillary: 83 mg/dL (ref 70–99)
Glucose-Capillary: 85 mg/dL (ref 70–99)

## 2014-01-20 LAB — CBC WITH DIFFERENTIAL/PLATELET
Basophils Absolute: 0.2 10*3/uL — ABNORMAL HIGH (ref 0.0–0.1)
Basophils Relative: 1 % (ref 0–1)
EOS ABS: 0.5 10*3/uL (ref 0.0–0.7)
Eosinophils Relative: 3 % (ref 0–5)
HCT: 30.9 % — ABNORMAL LOW (ref 39.0–52.0)
HEMOGLOBIN: 11.3 g/dL — AB (ref 13.0–17.0)
LYMPHS ABS: 1.8 10*3/uL (ref 0.7–4.0)
Lymphocytes Relative: 9 % — ABNORMAL LOW (ref 12–46)
MCH: 29.5 pg (ref 26.0–34.0)
MCHC: 36.6 g/dL — AB (ref 30.0–36.0)
MCV: 80.7 fL (ref 78.0–100.0)
MONO ABS: 1.4 10*3/uL — AB (ref 0.1–1.0)
MONOS PCT: 7 % (ref 3–12)
Neutro Abs: 15.8 10*3/uL — ABNORMAL HIGH (ref 1.7–7.7)
Neutrophils Relative %: 81 % — ABNORMAL HIGH (ref 43–77)
Platelets: 60 10*3/uL — ABNORMAL LOW (ref 150–400)
RBC: 3.83 MIL/uL — AB (ref 4.22–5.81)
RDW: 14.8 % (ref 11.5–15.5)
WBC Morphology: INCREASED
WBC: 19.5 10*3/uL — ABNORMAL HIGH (ref 4.0–10.5)

## 2014-01-20 LAB — PREPARE FRESH FROZEN PLASMA
UNIT DIVISION: 0
UNIT DIVISION: 0
Unit division: 0
Unit division: 0

## 2014-01-20 LAB — POCT I-STAT 3, ART BLOOD GAS (G3+)
ACID-BASE EXCESS: 1 mmol/L (ref 0.0–2.0)
Bicarbonate: 24 mEq/L (ref 20.0–24.0)
O2 Saturation: 91 %
PCO2 ART: 28.1 mmHg — AB (ref 35.0–45.0)
Patient temperature: 94
TCO2: 25 mmol/L (ref 0–100)
pH, Arterial: 7.53 — ABNORMAL HIGH (ref 7.350–7.450)
pO2, Arterial: 46 mmHg — ABNORMAL LOW (ref 80.0–100.0)

## 2014-01-20 LAB — RENAL FUNCTION PANEL
ALBUMIN: 2.1 g/dL — AB (ref 3.5–5.2)
BUN: 3 mg/dL — AB (ref 6–23)
CALCIUM: 6.9 mg/dL — AB (ref 8.4–10.5)
CHLORIDE: 103 meq/L (ref 96–112)
CO2: 22 mEq/L (ref 19–32)
Creatinine, Ser: 0.47 mg/dL — ABNORMAL LOW (ref 0.50–1.35)
GFR calc Af Amer: >90 mL/min (ref >90)
GFR calc non Af Amer: >90 mL/min (ref >90)
Glucose, Bld: 105 mg/dL — ABNORMAL HIGH (ref 70–99)
Phosphorus: 1.9 mg/dL — ABNORMAL LOW (ref 2.3–4.6)
Potassium: 3.7 mEq/L (ref 3.7–5.3)
Sodium: 137 mEq/L (ref 137–147)

## 2014-01-20 LAB — PHOSPHORUS
PHOSPHORUS: 1.1 mg/dL — AB (ref 2.3–4.6)
Phosphorus: 0.8 mg/dL — CL (ref 2.3–4.6)

## 2014-01-20 LAB — COMPREHENSIVE METABOLIC PANEL
ALBUMIN: 1.9 g/dL — AB (ref 3.5–5.2)
ALT: 155 U/L — AB (ref 0–53)
AST: 124 U/L — AB (ref 0–37)
Alkaline Phosphatase: 164 U/L — ABNORMAL HIGH (ref 39–117)
BUN: 4 mg/dL — ABNORMAL LOW (ref 6–23)
CO2: 24 meq/L (ref 19–32)
Calcium: 7.3 mg/dL — ABNORMAL LOW (ref 8.4–10.5)
Chloride: 102 mEq/L (ref 96–112)
Creatinine, Ser: 0.5 mg/dL (ref 0.50–1.35)
GFR calc Af Amer: >90 mL/min (ref >90)
Glucose, Bld: 86 mg/dL (ref 70–99)
Potassium: 3.6 mEq/L — ABNORMAL LOW (ref 3.7–5.3)
SODIUM: 138 meq/L (ref 137–147)
TOTAL PROTEIN: 4.4 g/dL — AB (ref 6.0–8.3)
Total Bilirubin: 7.1 mg/dL — ABNORMAL HIGH (ref 0.3–1.2)

## 2014-01-20 LAB — PREPARE PLATELET PHERESIS: Unit division: 0

## 2014-01-20 LAB — CBC
HCT: 30.1 % — ABNORMAL LOW (ref 39.0–52.0)
HEMATOCRIT: 32.2 % — AB (ref 39.0–52.0)
HEMATOCRIT: 30.7 % — AB (ref 39.0–52.0)
HEMOGLOBIN: 11.1 g/dL — AB (ref 13.0–17.0)
Hemoglobin: 11.9 g/dL — ABNORMAL LOW (ref 13.0–17.0)
Hemoglobin: 11.3 g/dL — ABNORMAL LOW (ref 13.0–17.0)
MCH: 29.9 pg (ref 26.0–34.0)
MCH: 29.8 pg (ref 26.0–34.0)
MCH: 29.7 pg (ref 26.0–34.0)
MCHC: 36.9 g/dL — AB (ref 30.0–36.0)
MCHC: 37 g/dL — ABNORMAL HIGH (ref 30.0–36.0)
MCHC: 36.8 g/dL — ABNORMAL HIGH (ref 30.0–36.0)
MCV: 81.1 fL (ref 78.0–100.0)
MCV: 80.5 fL (ref 78.0–100.0)
MCV: 80.6 fL (ref 78.0–100.0)
Platelets: 86 10*3/uL — ABNORMAL LOW (ref 150–400)
Platelets: 85 10*3/uL — ABNORMAL LOW (ref 150–400)
Platelets: 47 10*3/uL — ABNORMAL LOW (ref 150–400)
RBC: 3.71 MIL/uL — AB (ref 4.22–5.81)
RBC: 4 MIL/uL — ABNORMAL LOW (ref 4.22–5.81)
RBC: 3.81 MIL/uL — ABNORMAL LOW (ref 4.22–5.81)
RDW: 14.5 % (ref 11.5–15.5)
RDW: 14.9 % (ref 11.5–15.5)
RDW: 14.8 % (ref 11.5–15.5)
WBC: 23.5 10*3/uL — ABNORMAL HIGH (ref 4.0–10.5)
WBC: 19.6 10*3/uL — AB (ref 4.0–10.5)
WBC: 19.3 10*3/uL — ABNORMAL HIGH (ref 4.0–10.5)

## 2014-01-20 LAB — MAGNESIUM
MAGNESIUM: 1.9 mg/dL (ref 1.5–2.5)
MAGNESIUM: 2 mg/dL (ref 1.5–2.5)

## 2014-01-20 LAB — APTT: aPTT: 40 seconds — ABNORMAL HIGH (ref 24–37)

## 2014-01-20 LAB — PROTIME-INR
INR: 1.63 — ABNORMAL HIGH (ref 0.00–1.49)
Prothrombin Time: 18.9 seconds — ABNORMAL HIGH (ref 11.6–15.2)

## 2014-01-20 LAB — LACTIC ACID, PLASMA: LACTIC ACID, VENOUS: 2.7 mmol/L — AB (ref 0.5–2.2)

## 2014-01-20 LAB — VANCOMYCIN, RANDOM: VANCOMYCIN RM: 12.9 ug/mL

## 2014-01-20 LAB — CORTISOL-AM, BLOOD: Cortisol - AM: 14.4 ug/dL (ref 4.3–22.4)

## 2014-01-20 LAB — HIV ANTIBODY (ROUTINE TESTING W REFLEX): HIV 1&2 Ab, 4th Generation: NONREACTIVE

## 2014-01-20 MED ORDER — FAT EMULSION 20 % IV EMUL
250.0000 mL | INTRAVENOUS | Status: AC
  Administered 2014-01-20: 250 mL via INTRAVENOUS
  Filled 2014-01-20: qty 250

## 2014-01-20 MED ORDER — DEXTROSE 5 % IV SOLN
20.0000 mmol | Freq: Once | INTRAVENOUS | Status: AC
  Administered 2014-01-20: 20 mmol via INTRAVENOUS
  Filled 2014-01-20: qty 6.67

## 2014-01-20 MED ORDER — VANCOMYCIN HCL IN DEXTROSE 1-5 GM/200ML-% IV SOLN
1000.0000 mg | INTRAVENOUS | Status: DC
Start: 2014-01-20 — End: 2014-01-22
  Administered 2014-01-20 – 2014-01-21 (×2): 1000 mg via INTRAVENOUS
  Filled 2014-01-20 (×3): qty 200

## 2014-01-20 MED ORDER — SODIUM CHLORIDE 0.9 % IV BOLUS (SEPSIS)
500.0000 mL | Freq: Once | INTRAVENOUS | Status: AC
  Administered 2014-01-20: 500 mL via INTRAVENOUS

## 2014-01-20 MED ORDER — VITAMIN K1 10 MG/ML IJ SOLN
10.0000 mg | Freq: Once | INTRAVENOUS | Status: AC
  Administered 2014-01-20: 10 mg via INTRAVENOUS
  Filled 2014-01-20: qty 1

## 2014-01-20 MED ORDER — POTASSIUM PHOSPHATE DIBASIC 3 MMOLE/ML IV SOLN
20.0000 mmol | Freq: Once | INTRAVENOUS | Status: AC
  Administered 2014-01-20: 20 mmol via INTRAVENOUS
  Filled 2014-01-20: qty 6.67

## 2014-01-20 MED ORDER — CLINIMIX E/DEXTROSE (5/15) 5 % IV SOLN
INTRAVENOUS | Status: AC
  Administered 2014-01-20: 18:00:00 via INTRAVENOUS
  Filled 2014-01-20: qty 1000

## 2014-01-20 MED ORDER — ALBUMIN HUMAN 25 % IV SOLN
12.5000 g | Freq: Once | INTRAVENOUS | Status: DC
  Filled 2014-01-20: qty 50

## 2014-01-20 MED ORDER — SODIUM CHLORIDE 0.9 % IV SOLN
Freq: Once | INTRAVENOUS | Status: AC
  Administered 2014-01-20: 1000 mL via INTRAVENOUS

## 2014-01-20 MED ORDER — ALBUMIN HUMAN 25 % IV SOLN
25.0000 g | Freq: Once | INTRAVENOUS | Status: AC
  Administered 2014-01-20: 25 g via INTRAVENOUS
  Filled 2014-01-20: qty 100

## 2014-01-20 MED ORDER — HYDROCORTISONE NA SUCCINATE PF 100 MG IJ SOLR
50.0000 mg | Freq: Four times a day (QID) | INTRAMUSCULAR | Status: DC
  Administered 2014-01-20 – 2014-01-25 (×20): 50 mg via INTRAVENOUS
  Filled 2014-01-20 (×26): qty 1

## 2014-01-20 MED ORDER — SODIUM CHLORIDE 0.9 % IV SOLN: INTRAVENOUS | Status: DC

## 2014-01-20 NOTE — Progress Notes (Signed)
PULMONARY / CRITICAL CARE MEDICINE   Name: [name] [name] MRN: [id number] DOB: May 28, 1971    ADMISSION DATE:  01/11/2014  PRIMARY SERVICE: PCCM  CHIEF COMPLAINT:  SOB, leg pain.  BRIEF PATIENT DESCRIPTION:  42 years old male resident of an assisted living facility with PMH relevant for schizoaffective disorder, polysubstance abuse, seizures, COPD,  malnutrition, GERD. Presented with SOB and found to be hypotensive despite 2 L of IVF's. Started on dopamine via peripheral IV by ED and  PCCM called to admit. No clear source of infection.   SIGNIFICANT EVENTS / STUDIES:  3/29 Admitted to PCCM with septic shock and HCAP. Resuscitated with IVFs, vasopressors. Intubated shortly after admission. Bicarbonate infusion initiated for severe metabolic acidosis 6/44 Persistent septic shock. Worsening renal function. Acidosis improved. HCO3 stopped 3/31 Off vasopressors. Severe agitation @ times. Fentanyl and dex infusions initiated. Not able to F/C. Oliguric with worsening renal indices. Foley cath changed out without improvement in Uo. No indication for HD at this time 4/1 Close to extubation, urine output slightly improved, will need to contact wife for discussion of goals of care 4/2 Still agitated, self-extubated, re-intubated successfully. Continued worsening renal function. No HCPA or family/significant other contact. Will consult with ethics committee to resume responsibility for [facility] decisions.  4/3 Renal function no worse. Hepatic function improving.  4/4 Started phenylephrine  4/5 Continued hypotension and oliguria. Unequal pupils. Severe metabolic acidosis, started on bicarb drip and CRRT. Started levophed. Started stress-steroids. Panculture for source of infection (likely abdominal). 4/6  Slowly improving acidosis on CRRT.  4/7 Improving acidosis, CT head w/o acute abnormality 4/6 CT abdo/pelvis distended and fluid-filled small bowel and colon  4/6 CT chest  large bilateral pleural effusions  with overlying atelectasis 4/7 Ischemic small bowel resection in OR. Transfused with ffp, vit K, and pRBCs  4/8 Bleeding with thrombocytopenia. Transfused with platelets and pRBC. Bedside wound vac change  LINES / TUBES: L IJ CVL 3/29 >>  ETT 3/29 >> 4/2, 4/2 (self extubated and re-intubated) >>   HD RIJ catheter 4/5 >> A-line 4/5 >>  CULTURES: MRSA PCR 3/29 >> POS Urine 3/29 >> NG Blood 3/28 >> NG Blood 3/29 >> NG Blood 4/5 >>NGTD Urine 4/5 >>NG Tracheal 3/31 >>  NG C diff 4/3 >> Neg C diff 4/5 >> Neg Gi Pathogen 4/5>> Neg  Tracheal 4/5 >>moderate Candida Albicans  ANTIBIOTICS: IV Vanc 3/29 >> 4/2; 4/6 >> Pip-tazo 3/29 >> 4/6 IV flagyl 4/5 >>4/7 PO vanc 4/6 >>4/7 IV micafungin 4/5 >>4/8 Meropenem 4/7 >>  SUBJECTIVE:  No acute events overnight. RASS -4. Pt sedated. Moderate output of bloody drainage from wound vac. Diarrhea improved.  VITAL SIGNS: Temp:  [91 F (32.8 C)-97.5 F (36.4 C)] 94 F (34.4 C) (04/09 0400) Pulse Rate:  [35-114] 95 (04/09 0645) Resp:  [0-35] 28 (04/09 0645) BP: (79-124)/(45-74) 124/73 mmHg (04/09 0600) SpO2:  [93 %-100 %] 95 % (04/09 0645) Arterial Line BP: (71-148)/(35-72) 143/57 mmHg (04/09 0645) FiO2 (%):  [30 %-100 %] 30 % (04/09 0600) Weight:  [63.9 kg (140 lb 14 oz)] 63.9 kg (140 lb 14 oz) (04/09 0630) HEMODYNAMICS: CVP:  [10 mmHg-12 mmHg] 10 mmHg VENTILATOR SETTINGS: Vent Mode:  [-] PRVC FiO2 (%):  [30 %-100 %] 30 % Set Rate:  [26 bmp-35 bmp] 28 bmp Vt Set:  [570 mL] 570 mL PEEP:  [5 cmH20] 5 cmH20 Plateau Pressure:  [20 cmH20-29 cmH20] 22 cmH20 INTAKE / OUTPUT: Intake/Output     04/08 0701 - 04/09 0700 04/09 0701 -  04/10 0700   I.V. (mL/kg) 3093.3 (48.4)    Blood 2126.5    NG/GT     IV Piggyback 286    Total Intake(mL/kg) 5505.8 (86.2)    Urine (mL/kg/hr) 35 (0)    Emesis/NG output 900 (0.6)    Drains 5500 (3.6)    Other 1850 (1.2)    Total Output 8285     Net -2779.3            PHYSICAL EXAMINATION: General:  critically ill, sedated  HEENT: WNL Cardiac: RRR  Lungs: Clear to ausculation in anterior fields   Abdomen: firm, distended, no BS, wound vac in place Ext: SCD's in place, cool, +3-4 edema up to abdomen GU: scrotal swelling Neuro: Unequal pupil  5 mm L>3 mm   PULMONARY  Recent Labs Lab 01/17/2014 0327 01/27/2014 1600 02/03/2014 1133 01/23/2014 1414 01/29/2014 1506 01/25/2014 1627  PHART 7.326* 7.312* 7.316* 7.278*  --  7.479*  PCO2ART 48.3* 51.7* 47.7* 55.8*  --  34.0*  PO2ART 66.6* 274.0* 62.0* 63.0*  --  346.0*  HCO3 24.7* 26.2* 24.8* 26.1*  --  25.3*  TCO2 26.2 28 26 28 24 26   O2SAT 94.8 100.0 91.0 88.0  --  100.0    CBC  Recent Labs Lab 02/09/2014 1633 02/06/2014 2230 01/20/14 0230  HGB 12.2* 10.9* 11.1*  HCT 34.0* 30.2* 30.1*  WBC 19.4* 19.9* 23.5*  PLT 30* 92* 86*    COAGULATION  Recent Labs Lab 01/16/14 2030 02/10/2014 0430 01/21/2014 1330 02/02/2014 0430  INR 2.50* 2.28* 1.91* 1.92*     CHEMISTRY  Recent Labs Lab 01/16/14 2030 01/17/14 0447  01/22/2014 0430 01/27/2014 1600 01/20/2014 0430 01/27/2014 1506 02/03/2014 1600 01/20/14 0433  NA 133* 137  < > 136* 137  138 137 135* 138 138  K 4.2 3.6*  < > 3.9 3.8  3.7 4.0 3.8 3.5* 3.6*  CL 91* 92*  < > 97 97 103 104 101 102  CO2 9* 12*  < > 24 22 26   --  20 24  GLUCOSE 198* 232*  < > 124* 157* 138* 91 94 86  BUN 54* 34*  < > 14 9 7  <3* 6 4*  CREATININE 4.98* 3.08*  < > 1.20 0.92 0.67 0.70 0.69 0.50  CALCIUM 6.7* 6.8*  < > 6.5* 6.7* 6.7*  --  7.2* 7.3*  MG 2.6* 2.2  --  2.0  --  2.0  --   --  1.9  PHOS 7.5* 3.8  < > 1.5* 3.0 1.3*  --  1.6* 0.8*  < > = values in this interval not displayed. Estimated Creatinine Clearance: 107.6 ml/min (by C-G formula based on Cr of 0.5).   LIVER  Recent Labs Lab 01/16/14 2030  01/17/14 1030  01/23/2014 0430 01/12/2014 1330 02/04/2014 1600 02/10/2014 0430 01/23/2014 1600 01/20/14 0433  AST 852*  --  693*  --  351*  --   --  171*  --  124*  ALT 578*  --  669*  --  495*  --   --  252*   --  155*  ALKPHOS 143*  --  165*  --  138*  --   --  111  --  164*  BILITOT 0.9  --  1.6*  --  1.8*  --   --  4.5*  --  7.1*  PROT 4.8*  --  5.1*  --  4.3*  --   --  4.1*  --  4.4*  ALBUMIN 1.3*  < >  1.3*  < > 1.2*  --  2.1* 1.6*  1.5* 2.1* 1.9*  INR 2.50*  --   --   --  2.28* 1.91*  --  1.92*  --   --   < > = values in this interval not displayed.   INFECTIOUS  Recent Labs Lab 01/15/14 1433 01/16/14 0500  01/17/14 0447  02/02/2014 0430 02/05/2014 1430 01/20/14 0500  LATICACIDVEN  --   --   < >  --   < > 2.6* 3.0* 2.7*  PROCALCITON 2.43 3.50  --  2.66  --   --   --   --   < > = values in this interval not displayed.   ENDOCRINE CBG (last 3)   Recent Labs  01/17/2014 1918 01/20/14 0019 01/20/14 0416  GLUCAP 86 83 82     IMAGING x48h  No results found.   ASSESSMENT / PLAN:  PULMONARY A: Ventilator Dependent Respiratory Failure    - cocern for ARDS Large bilateral effusions - thoracentesis?  - 2D- echo with no CHF  COPD - emphysema P:   ABG now, if able to lower MV, will consider SBT if pressors improve ARDS protocol Albuterol neb and duoneb PRN, limit when we can in ards pcxr in am  Consider tracheostomy, follow plat and coags prior  CARDIOVASCULAR A:  Septic shock on pressor support R/o burn out adrenal P:  -Levophed started 4/5 to maintain MAP > 60 mm Hg, some concern at 40 mics now -consider re addition vasopressin -F/U AM cortisol pending -cvp re assessment 3, may need to limit volume off and allow pos balance -bolus and follow response -cbc q12h  RENAL A:   Acute kidney failure, oligo-anuric -  likely due to ATN (severe sepsis)     -Cr wnl, still oliguric on CRRT Anion Gap Metabolic Acidosis due to lactic acidosis - resolved  Hypokalemia  Hypocalcemia Hypophosphatemia P:   Renal following Continue CRRT Avoid nephrotoxins Na - phos Some urine, keep foley Bolus, see cvs  GASTROINTESTINAL A:   Ischemic Small Bowel s/p resection   -  lactic acid improving H/O GERD, chronic PPI use Acute liver failure due to severe sepsis Mild pancreatitis - resolved P:   Surgery following NPO, bowel rest Fentanyl infusion pain Hold tube feedings osmolite and pro-stat initiated 3/31 Wound vac changes Avoid TPN, will d/w surgery likely hood of NGT feeds F/U xray abd  HEMATOLOGIC A:   Thrombocytopenia s/p transfusion   - active bleeding, most likely due to liver dysfunction and severe sepsis, not DIC (factor 8 wnl) Acute on Chronic Normocytic Anemia s/p transfusion     - active bleeding, likely due to severe sepsis and post-op bleeding Coagulapathy   - most likely due to liver dysfunction and severe sepsis  P:  Hold heparin, F/U HIT panel  Transfuse platelets, ffp, prbc as needed  Monitor CBC  This pm for plat tx needs redose vit K , coags pre trach consideration SCD's   INFECTIOUS A:   Severe sepsis      -small bowel ischemic bowel s/p resection Leukocytosis   RLL HCAP, NOS P:   Cont meropenem, IV vancomycin Consider dc vanc in am if no mrsa or enteroccus F/U HIV  ENDOCRINE A:   Assess for  Adrenal Insufficiency Hypothyroidism   - TSH wnl Hypoglycemia P:   Cont L-thyroxine  SSI with CBG Q 4 hr D50 as needed for CBG <70 F/U AM cortisol level has been random variable values  NEUROLOGIC/PSYCHOSOCIAL  A:   Anisocoria at baseline? Acute encephalopathy with severe agitation Chronic b/l Cerebellar Encephalomalacia  Schizoaffective disorder Seizure disorder No HCPOA identified P:   Fentanyl infusion and PRN pain  D/C Nimbex infusion started 4/8 Continue restraints  Hold Remeron, Zyprexa, Cogentin  IV phenytoin per pharmacy, consider restart For wua if able  Marjan Rabbani PGY1- IMTS  01/20/2014 7:08 AM  I have fully examined this patient and agree with above findings.    And edited in full Ccm time 35 min   Lavon Paganini. Titus Mould, MD, [place] Pgr: [facility]

## 2014-01-20 NOTE — Progress Notes (Signed)
PARENTERAL NUTRITION CONSULT NOTE - INITIAL  Pharmacy Consult for TPN Indication: Massive Bowel Resection  Allergies  Allergen Reactions  . Fexofenadine Hcl Other (See Comments)    unknown  . Percocet [Oxycodone-Acetaminophen] Other (See Comments)    unknown  . Thorazine [Chlorpromazine Hcl] Other (See Comments)    unknown    Patient Measurements: Height: 6' 2"  (188 cm) Weight: 140 lb 14 oz (63.9 kg) IBW/kg (Calculated) : 82.2   Vital Signs: Temp: 92.7 F (33.7 C) (04/09 0907) Temp src: Rectal (04/09 0907) BP: 89/59 mmHg (04/09 1100) Pulse Rate: 88 (04/09 1100) Intake/Output from previous day: 04/08 0701 - 04/09 0700 In: 6111.5 [I.V.:3254.2; Blood:2571.3; IV Piggyback:286] Out: 8433 [Urine:35; Emesis/NG output:900; Drains:5500] Intake/Output from this shift: Total I/O In: 1331.7 [I.V.:652.7; IV Piggyback:679] Out: 7893 [Drains:1000; Other:81]  Labs:  Recent Labs  01/22/2014 0430 02/04/2014 1330  01/15/2014 0430  01/17/2014 2230 01/20/14 0230 01/20/14 0630  WBC 41.0*  --   < > 32.7*  < > 19.9* 23.5* 19.6*  HGB 7.5*  --   < > 8.5*  < > 10.9* 11.1* 11.9*  HCT 21.8*  --   < > 23.8*  < > 30.2* 30.1* 32.2*  PLT PLATELET CLUMPS NOTED ON SMEAR, COUNT APPEARS INCREASED  --   < > 26*  29*  < > 92* 86* 85*  APTT 194*  --   --  62*  --   --   --   --   INR 2.28* 1.91*  --  1.92*  --   --   --   --   < > = values in this interval not displayed.   Recent Labs  01/12/2014 0430  02/01/2014 0430 01/13/2014 1506 02/04/2014 1600 01/20/14 0433  NA 136*  < > 137 135* 138 138  K 3.9  < > 4.0 3.8 3.5* 3.6*  CL 97  < > 103 104 101 102  CO2 24  < > 26  --  20 24  GLUCOSE 124*  < > 138* 91 94 86  BUN 14  < > 7 <3* 6 4*  CREATININE 1.20  < > 0.67 0.70 0.69 0.50  CALCIUM 6.5*  < > 6.7*  --  7.2* 7.3*  MG 2.0  --  2.0  --   --  1.9  PHOS 1.5*  < > 1.3*  --  1.6* 0.8*  PROT 4.3*  --  4.1*  --   --  4.4*  ALBUMIN 1.2*  < > 1.6*  1.5*  --  2.1* 1.9*  AST 351*  --  171*  --   --  124*   ALT 495*  --  252*  --   --  155*  ALKPHOS 138*  --  111  --   --  164*  BILITOT 1.8*  --  4.5*  --   --  7.1*  BILIDIR  --   --  3.9*  --   --   --   IBILI  --   --  0.6  --   --   --   < > = values in this interval not displayed. Estimated Creatinine Clearance: 107.6 ml/min (by C-G formula based on Cr of 0.5).    Recent Labs  01/20/14 0019 01/20/14 0416 01/20/14 0835  GLUCAP 83 82 85    Medical History: Past Medical History  Diagnosis Date  . Chronic pain   . Anxiety   . Schizoaffective disorder   . Hearing loss   .  Seizure   . Arthritis   . Asthma   . Foot drop   . Anemia   . Dehydration   . Hypotension   . GERD (gastroesophageal reflux disease)   . Hypothyroidism   . Constipation   . Polysubstance abuse   . Cancer   . Leukocytosis     Medications:  Prescriptions prior to admission  Medication Sig Dispense Refill  . albuterol (PROVENTIL HFA;VENTOLIN HFA) 108 (90 BASE) MCG/ACT inhaler Inhale 2 puffs into the lungs every 4 (four) hours as needed for wheezing or shortness of breath.      . ALPRAZolam (XANAX) 0.5 MG tablet Take 1 tablet (0.5 mg total) by mouth daily at 12 noon.  30 tablet  0  . benztropine (COGENTIN) 0.5 MG tablet Take 0.5-1 mg by mouth See admin instructions. Take 2 tablets in the morning and 1 tablet at bedtime      . doxepin (SINEQUAN) 50 MG capsule Take 50 mg by mouth at bedtime.      . ferrous sulfate 325 (65 FE) MG tablet Take 325 mg by mouth daily with breakfast.      . FLUoxetine (PROZAC) 10 MG capsule Take 10 mg by mouth daily.      Marland Kitchen FLUoxetine (PROZAC) 20 MG capsule Take 20 mg by mouth daily. With 10 mg to = 30 mg      . Fluticasone-Salmeterol (ADVAIR) 100-50 MCG/DOSE AEPB Inhale 1 puff into the lungs 2 (two) times daily.      Marland Kitchen guaifenesin (ROBITUSSIN) 100 MG/5ML syrup Take 200 mg by mouth every 4 (four) hours as needed for cough.      . levothyroxine (SYNTHROID, LEVOTHROID) 75 MCG tablet Take 75 mcg by mouth daily.      Marland Kitchen loratadine  (CLARITIN) 10 MG tablet Take 10 mg by mouth daily.      . megestrol (MEGACE) 400 MG/10ML suspension Take 400 mg by mouth 2 (two) times daily.      . metoprolol tartrate (LOPRESSOR) 25 MG tablet Take 25 mg by mouth 2 (two) times daily.      . mirtazapine (REMERON) 45 MG tablet Take 22.5 mg by mouth at bedtime.      . Nutritional Supplements (ENSURE PO) Take 1 Can by mouth 3 (three) times daily with meals.      Marland Kitchen OLANZapine (ZYPREXA) 15 MG tablet Take 15 mg by mouth at bedtime.      Marland Kitchen omeprazole (PRILOSEC) 40 MG capsule Take 40 mg by mouth daily.       . phenytoin (DILANTIN) 100 MG ER capsule Take 300 mg by mouth at bedtime.      . polyethylene glycol powder (GLYCOLAX/MIRALAX) powder Take 17 g by mouth daily.      . simvastatin (ZOCOR) 10 MG tablet Take 10 mg by mouth at bedtime.      . solifenacin (VESICARE) 10 MG tablet Take 10 mg by mouth daily.      . sucralfate (CARAFATE) 1 G tablet Take 1 g by mouth 4 (four) times daily.      . traMADol (ULTRAM) 50 MG tablet Take 50 mg by mouth every 6 (six) hours as needed for moderate pain.      Marland Kitchen zolpidem (AMBIEN) 5 MG tablet Take 1 tablet (5 mg total) by mouth at bedtime.  30 tablet  0  . acetaminophen (TYLENOL) 325 MG tablet Take 650 mg by mouth every 6 (six) hours as needed. Pain      . ondansetron (ZOFRAN ODT) 4  MG disintegrating tablet Take 1 tablet (4 mg total) by mouth every 8 (eight) hours as needed for nausea.  20 tablet  0    Insulin Requirements in the past 24 hours:  None   Current Nutrition:  NPO   Nutritional Goals:  Per RD recommendations   Assessment: 15 YOM who presented to [facility] with septic shock and HCAP on 3/29 was intubated shortly after admission. 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 Feeding supplements were stopped yesterday and he is now NPO with bowel rest. He has a history of GERD with chronic PPI use. Currently with acute liver failure due to severe sepsis.  Plan to OR tomorrow for VAC change.  Endo: CBGs 82-129 on SSI  Lytes: Na 138, K 3.6, Phos 0.8, Mg 1.9, CorrCa 8.98. KPhos given today. Since patient was on tube feeds till 4/5, likely low risk for re-feeding syndrome.  Renal: SCr 0.5, On CRRT  Pulm: 100% on 0.3 FiO2, PEEP 5  Cards: BP 117/57, HR 85, On Levophed   Hepatobil: AST/ALT elevated but trending down. Alk Phos elevated at 164, TBilli 7.1  Neuro: On fentanyl and versed. Nimbex infusion d/c'ed.   ID: Severe sepsis, On Merrem and Vancomycin. May consider dc vanc if no MRSA or eneterococcus. WBC 19.6  Best Practices: SCDs, PPI  TPN Access: CVC Triple lumen 3/29 TPN day#: 0  Plan:  1) Start Clinimix E 5/15 with lytes at 30 mL/hr + Lipids 20% at 10 mL/hr. Advance to goal as tolerated  2) Decrease MIVF when TPN hangs tonight  3) Multivitamin daily and M/W/F trace elements only due to shortage  4) F/u Phos level this evening at 1600 and supplement further if low. If below 1, consider holding TPN.  5) F/u TPN labs, Mg, Phos and Bmet    Albertina Parr, PharmD.  Clinical Pharmacist Pager [phone]

## 2014-01-20 NOTE — Progress Notes (Signed)
ANTIBIOTIC CONSULT NOTE - FOLLOW UP  Pharmacy Consult for Vancomycin, Meropenem Indication: Sepsis  Allergies  Allergen Reactions  . Fexofenadine Hcl Other (See Comments)    unknown  . Percocet [Oxycodone-Acetaminophen] Other (See Comments)    unknown  . Thorazine [Chlorpromazine Hcl] Other (See Comments)    unknown    Patient Measurements: Height: 6\' 2"  (188 cm) Weight: 140 lb 14 oz (63.9 kg) IBW/kg (Calculated) : 82.2 Vital Signs: Temp: 93.2 F (34 C) (04/09 1242) Temp src: Rectal (04/09 1242) BP: 117/57 mmHg (04/09 1208) Pulse Rate: 85 (04/09 1208) Intake/Output from previous day: 04/08 0701 - 04/09 0700 In: 6111.5 [I.V.:3254.2; Blood:2571.3; IV Piggyback:286] Out: 8433 [Urine:35; Emesis/NG output:900; Drains:5500] Intake/Output from this shift: Total I/O In: 1331.7 [I.V.:652.7; IV Piggyback:679] Out: 1081 [Drains:1000; Other:81]  Labs:  Recent Labs  01/12/2014 0430  02/05/2014 0430  02/10/2014 1506 02/02/2014 1600  01/20/14 0230 01/20/14 0433 01/20/14 0630 01/20/14 1223  WBC 41.0*  < > 32.7*  < >  --   --   < > 23.5*  --  19.6* 19.5*  HGB 7.5*  < > 8.5*  < > 11.6*  --   < > 11.1*  --  11.9* 11.3*  HCT 21.8*  < > 23.8*  < > 34.0*  --   < > 30.1*  --  32.2* 30.9*  PLT PLATELET CLUMPS NOTED ON SMEAR, COUNT APPEARS INCREASED  < > 26*  29*  < >  --   --   < > 86*  --  85* 60*  APTT 194*  --  62*  --   --   --   --   --   --   --   --   CREATININE 1.20  < > 0.67  --  0.70 0.69  --   --  0.50  --   --   MG 2.0  --  2.0  --   --   --   --   --  1.9  --   --   PHOS 1.5*  < > 1.3*  --   --  1.6*  --   --  0.8*  --   --   ALBUMIN 1.2*  < > 1.6*  1.5*  --   --  2.1*  --   --  1.9*  --   --   PROT 4.3*  --  4.1*  --   --   --   --   --  4.4*  --   --   AST 351*  --  171*  --   --   --   --   --  124*  --   --   ALT 495*  --  252*  --   --   --   --   --  155*  --   --   ALKPHOS 138*  --  111  --   --   --   --   --  164*  --   --   BILITOT 1.8*  --  4.5*  --   --   --   --    --  7.1*  --   --   BILIDIR  --   --  3.9*  --   --   --   --   --   --   --   --   IBILI  --   --  0.6  --   --   --   --   --   --   --   --   < > =  values in this interval not displayed. Estimated Creatinine Clearance: 107.6 ml/min (by C-G formula based on Cr of 0.5).   Microbiology: Recent Results (from the past 720 hour(s))  CULTURE, BLOOD (ROUTINE X 2)     Status: None   Collection Time    01/07/2014 11:58 PM      Result Value Ref Range Status   Specimen Description BLOOD WRIST RIGHT   Final   Special Requests BOTTLES DRAWN AEROBIC AND ANAEROBIC 5CC EA   Final   Culture  Setup Time     Final   Value: 01/09/2014 03:47     Performed at [facility]   Culture     Final   Value: NO GROWTH 5 DAYS     Performed at [facility]   Report Status 01/15/2014 FINAL   Final  CULTURE, BLOOD (ROUTINE X 2)     Status: None   Collection Time    01/09/14 12:24 AM      Result Value Ref Range Status   Specimen Description BLOOD LEFT ARM   Final   Special Requests BOTTLES DRAWN AEROBIC ONLY 10CC   Final   Culture  Setup Time     Final   Value: 01/09/2014 03:47     Performed at [facility]   Culture     Final   Value: NO GROWTH 5 DAYS     Performed at [facility]   Report Status 01/15/2014 FINAL   Final  URINE CULTURE     Status: None   Collection Time    01/09/14  2:42 AM      Result Value Ref Range Status   Specimen Description URINE, CATHETERIZED   Final   Special Requests ADD [id number]   Final   Culture  Setup Time     Final   Value: 01/09/2014 14:10     Performed at [place]     Final   Value: NO GROWTH     Performed at [facility]   Culture     Final   Value: NO GROWTH     Performed at [facility]   Report Status 01/10/2014 FINAL   Final  MRSA PCR SCREENING     Status: Abnormal   Collection Time    01/09/14  4:50 AM      Result Value Ref Range Status   MRSA by PCR POSITIVE (*) NEGATIVE Final    Comment:            The GeneXpert MRSA Assay (FDA     approved for NASAL specimens     only), is one component of a     comprehensive MRSA colonization     surveillance program. It is not     intended to diagnose MRSA     infection nor to guide or     monitor treatment for     MRSA infections.     RESULT CALLED TO, READ BACK BY AND VERIFIED WITH:     MACK,B RN [phone] 01/09/14 MITCHELL,L  CULTURE, RESPIRATORY (NON-EXPECTORATED)     Status: None   Collection Time    01/11/14 11:53 AM      Result Value Ref Range Status   Specimen Description TRACHEAL ASPIRATE   Final   Special Requests NONE   Final   Gram Stain     Final   Value: NO WBC SEEN     NO SQUAMOUS EPITHELIAL CELLS SEEN  NO ORGANISMS SEEN     Performed at [facility]   Culture     Final   Value: NO GROWTH 2 DAYS     Performed at [facility]   Report Status 01/13/2014 FINAL   Final  CLOSTRIDIUM DIFFICILE BY PCR     Status: None   Collection Time    01/14/14  2:17 PM      Result Value Ref Range Status   C difficile by pcr NEGATIVE  NEGATIVE Final  CULTURE, BLOOD (ROUTINE X 2)     Status: None   Collection Time    01/16/14  8:53 PM      Result Value Ref Range Status   Specimen Description BLOOD RIGHT HAND   Final   Special Requests BOTTLES DRAWN AEROBIC ONLY 5CC   Final   Culture  Setup Time     Final   Value: 01/17/2014 02:22     Performed at [facility]   Culture     Final   Value:        BLOOD CULTURE RECEIVED NO GROWTH TO DATE CULTURE WILL BE HELD FOR 5 DAYS BEFORE ISSUING A FINAL NEGATIVE REPORT     Performed at [facility]   Report Status PENDING   Incomplete  CULTURE, BLOOD (ROUTINE X 2)     Status: None   Collection Time    01/16/14  9:00 PM      Result Value Ref Range Status   Specimen Description BLOOD RIGHT HAND   Final   Special Requests BOTTLES DRAWN AEROBIC ONLY 3CC   Final   Culture  Setup Time     Final   Value: 01/17/2014 02:22     Performed at [facility]   Culture     Final   Value:        BLOOD CULTURE RECEIVED NO GROWTH TO DATE CULTURE WILL BE HELD FOR 5 DAYS BEFORE ISSUING A FINAL NEGATIVE REPORT     Performed at [facility]   Report Status PENDING   Incomplete  URINE CULTURE     Status: None   Collection Time    01/16/14  9:50 PM      Result Value Ref Range Status   Specimen Description URINE, RANDOM   Final   Special Requests NONE   Final   Culture  Setup Time     Final   Value: 01/16/2014 22:19     Performed at [place]     Final   Value: NO GROWTH     Performed at [facility]   Culture     Final   Value: NO GROWTH     Performed at [facility]   Report Status 01/17/2014 FINAL   Final  CLOSTRIDIUM DIFFICILE BY PCR     Status: None   Collection Time    01/16/14  9:57 PM      Result Value Ref Range Status   C difficile by pcr NEGATIVE  NEGATIVE Final  CULTURE, RESPIRATORY (NON-EXPECTORATED)     Status: None   Collection Time    01/16/14  9:59 PM      Result Value Ref Range Status   Specimen Description TRACHEAL ASPIRATE   Final   Special Requests NONE   Final   Gram Stain     Final   Value: FEW WBC PRESENT,BOTH PMN AND MONONUCLEAR     NO SQUAMOUS EPITHELIAL CELLS  SEEN     ABUNDANT YEAST     Performed at [facility]   Culture     Final   Value: MODERATE CANDIDA ALBICANS     Performed at [facility]   Report Status 02/06/2014 FINAL   Final    Assessment: 51 YOM with septic shock/HCAP on day #12 empiric abx to continue vancomycin and meropenem. Patient has acute liver and renal dysfunction. Started on CRRT on 4/5 and has had some time off of CRRT for multiple procedures including a small bowel resection. He is hypothermic and WBC are elevated but trending down.   Antibiotics:  3/29 Vanc >> 3/29 Zosyn >>4/6 4/6 Mero>> 4/5 Vanc PO>>4/7 4/5 Flagyl>>4/7 4/5 Micafungin>>4/8  Vancomycin Levels:  4/1 VT>>42.2 -- vanc held 4/5 VR>>24.1 --  vanc held 4/6 VR>>14.6 4/9 VR>>12.9 -- increase dose  Cultures:  3/31 TA>neg 3/29 Urine>neg 3/29 Blood>neg 3/29 MRSA PCR - pos 4/3 Cdiff>neg  4/5 BCx2>ngtd 4/5 UCx>neg 4/5 TA>mod CA 4/5 Cdiff>neg  Goal of Therapy:  Vancomycin goal of 15-20 mcg/ml   Plan: - Increase vancomycin to 1000 mg IV q24h  - Continue meropenem at 1gm IV q12h  - Continue to monitor tolerance of CRRT, temp, WBC, C&S, vancomycin levels prn, and clinical improvement  Harolyn Rutherford, PharmD Clinical Pharmacist - Resident Pager: [phone] Pharmacy: [phone] 01/20/2014 12:58 PM

## 2014-01-20 NOTE — Progress Notes (Signed)
CRITICAL VALUE ALERT  Critical value received:  Phosphorus .8  Date of notification:  01/20/2014   Time of notification:  0701  Critical value read back:yes  Nurse who received alert:  Janasha Barkalow Sherlynn Carbon RN   MD notified (1st page):  Dr. Naaman Plummer  Time of first page:  Spoke with her in person on unit  MD notified (2nd page):  Time of second page:  Responding MD:  Dr. Naaman Plummer  Time MD responded:  [phone]

## 2014-01-20 NOTE — Progress Notes (Signed)
Subjective: Interval History: has no complaint, less movement ,sedated.  Objective: Vital signs in last 24 hours: Temp:  [91 F (32.8 C)-97.5 F (36.4 C)] 94 F (34.4 C) (04/09 0400) Pulse Rate:  [35-114] 91 (04/09 0630) Resp:  [0-35] 28 (04/09 0630) BP: (79-124)/(45-74) 124/73 mmHg (04/09 0600) SpO2:  [93 %-100 %] 95 % (04/09 0630) Arterial Line BP: (71-148)/(35-72) 82/37 mmHg (04/09 0630) FiO2 (%):  [30 %-100 %] 30 % (04/09 0600) Weight:  [63.9 kg (140 lb 14 oz)] 63.9 kg (140 lb 14 oz) (04/09 0630) Weight change: -4.2 kg (-9 lb 4.2 oz)  Intake/Output from previous day: 04/08 0701 - 04/09 0700 In: 5505.8 [I.V.:3093.3; Blood:2126.5; IV Piggyback:286] Out: 9147 [Urine:35; Emesis/NG output:900; Drains:5000] Intake/Output this shift: Total I/O In: 2304.3 [I.V.:1640.3; Blood:564; IV Piggyback:100] Out: 5122 [Emesis/NG output:900; Drains:3000; Other:1222]  General appearance: pale and on vent , sedated Resp: rhonchi bilaterally Cardio: regularly irregular rhythm GI: no bs, vac, firm Extremities: edema 3-4+  Lab Results:  Recent Labs  01/14/2014 2230 01/20/14 0230  WBC 19.9* 23.5*  HGB 10.9* 11.1*  HCT 30.2* 30.1*  PLT 92* 86*   BMET:  Recent Labs  01/21/2014 0430 02/04/2014 1506 01/12/2014 1600  NA 137 135* 138  K 4.0 3.8 3.5*  CL 103 104 101  CO2 26  --  20  GLUCOSE 138* 91 94  BUN 7 <3* 6  CREATININE 0.67 0.70 0.69  CALCIUM 6.7*  --  7.2*   No results found for this basename: PTH,  in the last 72 hours Iron Studies:  Recent Labs  01/12/2014 0807  IRON 117  TIBC 150*    Studies/Results: No results found.  I have reviewed the patient's current medications.  Assessment/Plan: 1 AKI ^pressors during the eve.  Will try to keep even with adding back wound drainage, has been neg. Chem Pending 2 anemia stabel 3 Schock pressors ^ 4 VDRF per CCM 5 ischemic bowel 6 Infx 3 AB 7 low ptlt P CRRT, even fluids. Check labs, AB, vent,      LOS: 12 days   [name] [name] L  [name] [name] 01/20/2014,6:50 AM

## 2014-01-20 NOTE — Progress Notes (Signed)
[facility] Progress Note 1 Day Post-Op  Subjective: Patient is paralyzed and sedated.  BP better.  On Levo at lowere level.  Still getting CRT   Objective: Vital signs in last 24 hours: Temp:  [91 F (32.8 C)-97.5 F (36.4 C)] 94 F (34.4 C) (04/09 0400) Pulse Rate:  [35-114] 93 (04/09 0817) Resp:  [0-35] 28 (04/09 0817) BP: (79-124)/(44-74) 105/44 mmHg (04/09 0817) SpO2:  [93 %-100 %] 100 % (04/09 0817) Arterial Line BP: (63-148)/(29-72) 63/29 mmHg (04/09 0715) FiO2 (%):  [30 %-100 %] 30 % (04/09 0818) Weight:  [63.9 kg (140 lb 14 oz)] 63.9 kg (140 lb 14 oz) (04/09 0630) Last BM Date: 01/13/2014  Intake/Output from previous day: 04/08 0701 - 04/09 0700 In: 5666.7 [I.V.:3254.2; Blood:2126.5; IV Piggyback:286] Out: 8433 [Urine:35; Emesis/NG output:900; Drains:5500] Intake/Output this shift:    General: No acute distress, paralyzed and sedated  Lungs: Clear  Abd: VAC in place, Has a small blood bubble on the right.  Will take back to the OR to change tomorrow.  Extremities: No changes  Neuro: Sedated and paralyzed.  Lab Results:  @LABLAST2 (wbc:2,hgb:2,hct:2,plt:2) BMET  Recent Labs  01/17/2014 1600 01/20/14 0433  NA 138 138  K 3.5* 3.6*  CL 101 102  CO2 20 24  GLUCOSE 94 86  BUN 6 4*  CREATININE 0.69 0.50  CALCIUM 7.2* 7.3*   PT/INR  Recent Labs  01-31-14 1330 02/07/2014 0430  LABPROT 21.3* 21.4*  INR 1.91* 1.92*   ABG  Recent Labs  01/29/2014 1414 01/31/2014 1627  PHART 7.278* 7.479*  HCO3 26.1* 25.3*    Studies/Results: Dg Chest Port 1 View  01/20/2014   CLINICAL DATA:  Dyspnea  EXAM: PORTABLE CHEST - 1 VIEW  COMPARISON:  DG CHEST 1V PORT dated 01/31/14  FINDINGS: A left internal jugular catheter is appreciated tip projecting regions superior vena cava. A right internal jugular sheath is demonstrated unchanged. Endotracheal tube tip 5 cm above the carina. NG tube tip not viewed on this study. Cardiac silhouette within normal limits. There is slight  increase in the bilateral pulmonary opacities. Blunting of the right costophrenic angle is appreciated. No acute osseous abnormalities.  IMPRESSION: Slightly increased conspicuity of the bilateral infiltrates with greatest confluence of the lung bases right greater than left. Small right effusion.  Support apparatus  adequately position.   Electronically Signed   By: Margaree Mackintosh M.D.   On: 01/20/2014 07:25   Dg Abd Portable 1v  01/20/2014   CLINICAL DATA:  Recent small bowel resection  EXAM: PORTABLE ABDOMEN - 1 VIEW  COMPARISON:  CT ABD/PELV WO CM dated 01/17/2014  FINDINGS: There is appreciated within nondilated loops of large small bowel. An NG tube appreciated tip projecting in the expected region of stomach. A catheter projects in the central portion of the abdomen may represent a post surgical drainage catheter. Osseous structures unremarkable.  IMPRESSION: Nonobstructive bowel gas pattern.   Electronically Signed   By: Margaree Mackintosh M.D.   On: 01/20/2014 07:27    Anti-infectives: Anti-infectives   Start     Dose/Rate Route Frequency Ordered Stop   31-Jan-2014 0000  meropenem (MERREM) 1 g in sodium chloride 0.9 % 100 mL IVPB     1 g 200 mL/hr over 30 Minutes Intravenous Every 12 hours 01/17/14 1405     01/17/14 1300  vancomycin (VANCOCIN) IVPB 750 mg/150 ml premix     750 mg 150 mL/hr over 60 Minutes Intravenous Every 24 hours 01/17/14 1211     01/17/14  1000  meropenem (MERREM) 2 g in sodium chloride 0.9 % 100 mL IVPB  Status:  Discontinued     2 g 200 mL/hr over 30 Minutes Intravenous Every 18 hours 01/17/14 0913 01/17/14 1405   01/17/14 0000  vancomycin (VANCOCIN) 50 mg/mL oral solution 500 mg  Status:  Discontinued     500 mg Oral 4 times per day 01/16/14 2008 01/27/2014 0952   01/17/14 0000  piperacillin-tazobactam (ZOSYN) IVPB 3.375 g  Status:  Discontinued     3.375 g 100 mL/hr over 30 Minutes Intravenous 4 times per day 01/16/14 2202 01/17/14 0854   01/16/14 2200  metroNIDAZOLE  (FLAGYL) tablet 500 mg  Status:  Discontinued     500 mg Oral 3 times per day 01/16/14 1905 01/16/14 2002   01/16/14 2015  metroNIDAZOLE (FLAGYL) IVPB 500 mg  Status:  Discontinued     500 mg 100 mL/hr over 60 Minutes Intravenous Every 8 hours 01/16/14 2002 01/12/2014 1001   01/16/14 2000  vancomycin (VANCOCIN) 50 mg/mL oral solution 250 mg  Status:  Discontinued     250 mg Oral 4 times per day 01/16/14 1957 01/16/14 2008   01/16/14 2000  micafungin (MYCAMINE) 100 mg in sodium chloride 0.9 % 100 mL IVPB  Status:  Discontinued     100 mg 100 mL/hr over 1 Hours Intravenous Daily 01/16/14 1957 02-17-2014 1117   01/16/14 1800  piperacillin-tazobactam (ZOSYN) IVPB 3.375 g  Status:  Discontinued     3.375 g 12.5 mL/hr over 240 Minutes Intravenous 4 times per day 01/16/14 1648 01/16/14 2202   01/12/14 1200  piperacillin-tazobactam (ZOSYN) IVPB 2.25 g  Status:  Discontinued     2.25 g 100 mL/hr over 30 Minutes Intravenous 4 times per day 01/12/14 0844 01/16/14 1648   01/12/14 0500  vancomycin (VANCOCIN) 500 mg in sodium chloride 0.9 % 100 mL IVPB  Status:  Discontinued     500 mg 100 mL/hr over 60 Minutes Intravenous Every 24 hours 01/11/14 0902 01/12/14 1242   01/10/14 0400  vancomycin (VANCOCIN) IVPB 750 mg/150 ml premix  Status:  Discontinued     750 mg 150 mL/hr over 60 Minutes Intravenous Every 24 hours 01/09/14 0353 01/11/14 0902   01/09/14 1200  piperacillin-tazobactam (ZOSYN) IVPB 3.375 g  Status:  Discontinued     3.375 g 12.5 mL/hr over 240 Minutes Intravenous Every 8 hours 01/09/14 0353 01/12/14 0844   01/09/14 0330  piperacillin-tazobactam (ZOSYN) IVPB 3.375 g     3.375 g 100 mL/hr over 30 Minutes Intravenous  Once 01/09/14 0329 01/09/14 0358   01/09/14 0330  vancomycin (VANCOCIN) IVPB 1000 mg/200 mL premix     1,000 mg 200 mL/hr over 60 Minutes Intravenous  Once 01/09/14 0329 01/09/14 0437      Assessment/Plan: s/p Procedure(s): ABDOMINAL VACUUM ASSISTED CLOSURE CHANGE Return to  OR tomorrow. VAC change and likely bowel anastomosis tomorrow.  LOS: 12 days   Kathryne Eriksson. Dahlia Bailiff, MD, FACS [phone] [facility] Surgery 01/20/2014

## 2014-01-21 ENCOUNTER — Encounter (HOSPITAL_COMMUNITY): Payer: Medicare Other | Admitting: Anesthesiology

## 2014-01-21 ENCOUNTER — Encounter (HOSPITAL_COMMUNITY): Admission: EM | Disposition: E | Payer: Self-pay | Source: Skilled Nursing Facility | Attending: Internal Medicine

## 2014-01-21 ENCOUNTER — Inpatient Hospital Stay (HOSPITAL_COMMUNITY): Payer: Medicare Other

## 2014-01-21 ENCOUNTER — Inpatient Hospital Stay (HOSPITAL_COMMUNITY): Payer: Medicare Other | Admitting: Anesthesiology

## 2014-01-21 ENCOUNTER — Encounter (HOSPITAL_COMMUNITY): Payer: Self-pay | Admitting: Certified Registered Nurse Anesthetist

## 2014-01-21 ENCOUNTER — Encounter (HOSPITAL_COMMUNITY): Payer: Medicare Other

## 2014-01-21 DIAGNOSIS — J9600 Acute respiratory failure, unspecified whether with hypoxia or hypercapnia: Secondary | ICD-10-CM

## 2014-01-21 DIAGNOSIS — R0602 Shortness of breath: Secondary | ICD-10-CM | POA: Diagnosis not present

## 2014-01-21 DIAGNOSIS — M79609 Pain in unspecified limb: Secondary | ICD-10-CM

## 2014-01-21 DIAGNOSIS — A4102 Sepsis due to Methicillin resistant Staphylococcus aureus: Secondary | ICD-10-CM | POA: Diagnosis not present

## 2014-01-21 HISTORY — PX: BOWEL RESECTION: SHX1257

## 2014-01-21 HISTORY — PX: GASTROSTOMY: SHX5249

## 2014-01-21 HISTORY — PX: APPLICATION OF WOUND VAC: SHX5189

## 2014-01-21 HISTORY — PX: TRACHEOSTOMY TUBE PLACEMENT: SHX814

## 2014-01-21 LAB — RENAL FUNCTION PANEL
ALBUMIN: 2.3 g/dL — AB (ref 3.5–5.2)
BUN: 8 mg/dL (ref 6–23)
CO2: 23 mEq/L (ref 19–32)
CREATININE: 0.76 mg/dL (ref 0.50–1.35)
Calcium: 7.6 mg/dL — ABNORMAL LOW (ref 8.4–10.5)
Chloride: 99 mEq/L (ref 96–112)
Glucose, Bld: 151 mg/dL — ABNORMAL HIGH (ref 70–99)
Phosphorus: 5.1 mg/dL — ABNORMAL HIGH (ref 2.3–4.6)
Potassium: 4.4 mEq/L (ref 3.7–5.3)
Sodium: 134 mEq/L — ABNORMAL LOW (ref 137–147)

## 2014-01-21 LAB — PROTIME-INR
INR: 1.42 (ref 0.00–1.49)
Prothrombin Time: 17 seconds — ABNORMAL HIGH (ref 11.6–15.2)

## 2014-01-21 LAB — GLUCOSE, CAPILLARY
GLUCOSE-CAPILLARY: 139 mg/dL — AB (ref 70–99)
GLUCOSE-CAPILLARY: 205 mg/dL — AB (ref 70–99)
Glucose-Capillary: 219 mg/dL — ABNORMAL HIGH (ref 70–99)
Glucose-Capillary: 134 mg/dL — ABNORMAL HIGH (ref 70–99)
Glucose-Capillary: 160 mg/dL — ABNORMAL HIGH (ref 70–99)
Glucose-Capillary: 148 mg/dL — ABNORMAL HIGH (ref 70–99)

## 2014-01-21 LAB — COMPREHENSIVE METABOLIC PANEL
ALBUMIN: 1.9 g/dL — AB (ref 3.5–5.2)
ALK PHOS: 151 U/L — AB (ref 39–117)
ALT: 136 U/L — ABNORMAL HIGH (ref 0–53)
AST: 90 U/L — AB (ref 0–37)
BUN: 4 mg/dL — AB (ref 6–23)
CHLORIDE: 100 meq/L (ref 96–112)
CO2: 22 mEq/L (ref 19–32)
Calcium: 7.5 mg/dL — ABNORMAL LOW (ref 8.4–10.5)
Creatinine, Ser: 0.45 mg/dL — ABNORMAL LOW (ref 0.50–1.35)
GFR calc Af Amer: >90 mL/min (ref >90)
GFR calc non Af Amer: >90 mL/min (ref >90)
Glucose, Bld: 179 mg/dL — ABNORMAL HIGH (ref 70–99)
POTASSIUM: 4.3 meq/L (ref 3.7–5.3)
SODIUM: 135 meq/L — AB (ref 137–147)
TOTAL PROTEIN: 4.5 g/dL — AB (ref 6.0–8.3)
Total Bilirubin: 5.8 mg/dL — ABNORMAL HIGH (ref 0.3–1.2)

## 2014-01-21 LAB — CBC
HCT: 31.2 % — ABNORMAL LOW (ref 39.0–52.0)
HCT: 28.9 % — ABNORMAL LOW (ref 39.0–52.0)
HEMOGLOBIN: 10.4 g/dL — AB (ref 13.0–17.0)
Hemoglobin: 11.8 g/dL — ABNORMAL LOW (ref 13.0–17.0)
MCH: 30.8 pg (ref 26.0–34.0)
MCH: 30.2 pg (ref 26.0–34.0)
MCHC: 37.8 g/dL — ABNORMAL HIGH (ref 30.0–36.0)
MCHC: 36 g/dL (ref 30.0–36.0)
MCV: 81.5 fL (ref 78.0–100.0)
MCV: 84 fL (ref 78.0–100.0)
PLATELETS: 44 10*3/uL — AB (ref 150–400)
PLATELETS: 70 10*3/uL — AB (ref 150–400)
RBC: 3.83 MIL/uL — ABNORMAL LOW (ref 4.22–5.81)
RBC: 3.44 MIL/uL — AB (ref 4.22–5.81)
RDW: 15.4 % (ref 11.5–15.5)
RDW: 16.1 % — ABNORMAL HIGH (ref 11.5–15.5)
WBC: 16.9 10*3/uL — ABNORMAL HIGH (ref 4.0–10.5)
WBC: 22.5 10*3/uL — ABNORMAL HIGH (ref 4.0–10.5)

## 2014-01-21 LAB — POCT I-STAT 3, ART BLOOD GAS (G3+)
Acid-base deficit: 2 mmol/L (ref 0.0–2.0)
Bicarbonate: 22.4 mEq/L (ref 20.0–24.0)
O2 Saturation: 88 %
PCO2 ART: 34.1 mmHg — AB (ref 35.0–45.0)
Patient temperature: 97.3
TCO2: 23 mmol/L (ref 0–100)
pH, Arterial: 7.422 (ref 7.350–7.450)
pO2, Arterial: 52 mmHg — ABNORMAL LOW (ref 80.0–100.0)

## 2014-01-21 LAB — PHOSPHORUS: PHOSPHORUS: 1.9 mg/dL — AB (ref 2.3–4.6)

## 2014-01-21 LAB — APTT: aPTT: 38 seconds — ABNORMAL HIGH (ref 24–37)

## 2014-01-21 LAB — MAGNESIUM: MAGNESIUM: 2.1 mg/dL (ref 1.5–2.5)

## 2014-01-21 SURGERY — CREATION, TRACHEOSTOMY
Anesthesia: General | Site: Neck

## 2014-01-21 MED ORDER — ROCURONIUM BROMIDE 50 MG/5ML IV SOLN
INTRAVENOUS | Status: AC
  Filled 2014-01-21: qty 1

## 2014-01-21 MED ORDER — TRACE MINERALS CR-CU-F-FE-I-MN-MO-SE-ZN IV SOLN
INTRAVENOUS | Status: AC
  Administered 2014-01-21: 17:00:00 via INTRAVENOUS
  Filled 2014-01-21: qty 2000

## 2014-01-21 MED ORDER — PROPOFOL 10 MG/ML IV BOLUS
INTRAVENOUS | Status: AC
  Filled 2014-01-21: qty 20

## 2014-01-21 MED ORDER — NOREPINEPHRINE BITARTRATE 1 MG/ML IJ SOLN
4.0000 mg | INTRAVENOUS | Status: DC | PRN
  Administered 2014-01-21: 25 ug/min via INTRAVENOUS

## 2014-01-21 MED ORDER — SODIUM PHOSPHATE 3 MMOLE/ML IV SOLN
20.0000 mmol | Freq: Once | INTRAVENOUS | Status: AC
  Administered 2014-01-21: 20 mmol via INTRAVENOUS
  Filled 2014-01-21: qty 6.67

## 2014-01-21 MED ORDER — LIDOCAINE HCL (CARDIAC) 20 MG/ML IV SOLN
INTRAVENOUS | Status: AC
  Filled 2014-01-21: qty 5

## 2014-01-21 MED ORDER — SODIUM CHLORIDE 0.9 % IV SOLN
INTRAVENOUS | Status: DC
  Administered 2014-01-22 – 2014-01-23 (×2): via INTRAVENOUS

## 2014-01-21 MED ORDER — FENTANYL CITRATE 0.05 MG/ML IJ SOLN: 50.0000 ug | INTRAMUSCULAR | Status: DC | PRN

## 2014-01-21 MED ORDER — ROCURONIUM BROMIDE 100 MG/10ML IV SOLN
INTRAVENOUS | Status: DC | PRN
  Administered 2014-01-21: 20 mg via INTRAVENOUS
  Administered 2014-01-21: 10 mg via INTRAVENOUS
  Administered 2014-01-21: 40 mg via INTRAVENOUS
  Administered 2014-01-21: 10 mg via INTRAVENOUS

## 2014-01-21 MED ORDER — MIDAZOLAM HCL 5 MG/ML IJ SOLN
50.0000 mg | INTRAMUSCULAR | Status: DC | PRN
  Administered 2014-01-21: 1 mg/h via INTRAVENOUS

## 2014-01-21 MED ORDER — MIDAZOLAM HCL 2 MG/2ML IJ SOLN
1.0000 mg | INTRAMUSCULAR | Status: DC | PRN
  Administered 2014-01-22: 2 mg via INTRAVENOUS

## 2014-01-21 MED ORDER — SODIUM CHLORIDE 0.9 % IV SOLN
2500.0000 ug | INTRAVENOUS | Status: DC | PRN
  Administered 2014-01-21: 200 ug/h via INTRAVENOUS

## 2014-01-21 MED ORDER — FAT EMULSION 20 % IV EMUL
240.0000 mL | INTRAVENOUS | Status: AC
  Administered 2014-01-21: 240 mL via INTRAVENOUS
  Filled 2014-01-21: qty 250

## 2014-01-21 MED ORDER — LACTATED RINGERS IV SOLN
INTRAVENOUS | Status: DC | PRN
  Administered 2014-01-21: 14:00:00 via INTRAVENOUS

## 2014-01-21 MED ORDER — ALBUMIN HUMAN 25 % IV SOLN
25.0000 g | Freq: Once | INTRAVENOUS | Status: AC
  Administered 2014-01-21: 25 g via INTRAVENOUS
  Filled 2014-01-21: qty 100

## 2014-01-21 MED ORDER — FENTANYL CITRATE 0.05 MG/ML IJ SOLN
INTRAMUSCULAR | Status: AC
  Filled 2014-01-21: qty 5

## 2014-01-21 MED ORDER — SODIUM CHLORIDE 0.9 % IV SOLN
INTRAVENOUS | Status: DC
  Administered 2014-01-21: 250 mL/h via INTRAVENOUS
  Administered 2014-01-22: 04:00:00 via INTRAVENOUS

## 2014-01-21 MED ORDER — ONDANSETRON HCL 4 MG/2ML IJ SOLN
INTRAMUSCULAR | Status: AC
  Filled 2014-01-21: qty 2

## 2014-01-21 MED ORDER — PHENYLEPHRINE 40 MCG/ML (10ML) SYRINGE FOR IV PUSH (FOR BLOOD PRESSURE SUPPORT)
PREFILLED_SYRINGE | INTRAVENOUS | Status: AC
  Filled 2014-01-21: qty 10

## 2014-01-21 SURGICAL SUPPLY — 81 items
ATTRACTOMAT 16X20 MAGNETIC DRP (DRAPES) IMPLANT
BENZOIN TINCTURE PRP APPL 2/3 (GAUZE/BANDAGES/DRESSINGS) ×10 IMPLANT
BLADE SURG 11 STRL SS (BLADE) ×5 IMPLANT
BLADE SURG 15 STRL LF DISP TIS (BLADE) IMPLANT
BLADE SURG 15 STRL SS (BLADE)
BLADE SURG ROTATE 9660 (MISCELLANEOUS) IMPLANT
CANISTER SUCTION 2500CC (MISCELLANEOUS) ×5 IMPLANT
CANISTER WOUND CARE 500ML ATS (WOUND CARE) ×5 IMPLANT
CATH MALECOT BARD  24FR (CATHETERS) ×2
CATH MALECOT BARD 24FR (CATHETERS) ×3 IMPLANT
CATH MUSHROOM 24FR (CATHETERS) IMPLANT
CHLORAPREP W/TINT 26ML (MISCELLANEOUS) ×5 IMPLANT
CLEANER TIP ELECTROSURG 2X2 (MISCELLANEOUS) ×5 IMPLANT
COVER MAYO STAND STRL (DRAPES) IMPLANT
COVER SURGICAL LIGHT HANDLE (MISCELLANEOUS) ×10 IMPLANT
DRAPE LAPAROSCOPIC ABDOMINAL (DRAPES) ×5 IMPLANT
DRAPE ORTHO SPLIT 77X108 STRL (DRAPES) ×4
DRAPE PED LAPAROTOMY (DRAPES) ×5 IMPLANT
DRAPE PROXIMA HALF (DRAPES) IMPLANT
DRAPE SURG ORHT 6 SPLT 77X108 (DRAPES) ×6 IMPLANT
DRAPE UTILITY 15X26 W/TAPE STR (DRAPE) ×20 IMPLANT
DRAPE WARM FLUID 44X44 (DRAPE) ×5 IMPLANT
DRSG OPSITE POSTOP 4X10 (GAUZE/BANDAGES/DRESSINGS) IMPLANT
DRSG OPSITE POSTOP 4X8 (GAUZE/BANDAGES/DRESSINGS) IMPLANT
DRSG VAC ATS LRG SENSATRAC (GAUZE/BANDAGES/DRESSINGS) ×5 IMPLANT
ELECT BLADE 6.5 EXT (BLADE) IMPLANT
ELECT CAUTERY BLADE 6.4 (BLADE) ×5 IMPLANT
ELECT REM PT RETURN 9FT ADLT (ELECTROSURGICAL) ×5
ELECTRODE REM PT RTRN 9FT ADLT (ELECTROSURGICAL) ×3 IMPLANT
GAUZE SPONGE 4X4 16PLY XRAY LF (GAUZE/BANDAGES/DRESSINGS) ×5 IMPLANT
GEL ULTRASOUND 20GR AQUASONIC (MISCELLANEOUS) ×5 IMPLANT
GLOVE BIOGEL PI IND STRL 7.0 (GLOVE) ×6 IMPLANT
GLOVE BIOGEL PI IND STRL 8 (GLOVE) ×6 IMPLANT
GLOVE BIOGEL PI INDICATOR 7.0 (GLOVE) ×4
GLOVE BIOGEL PI INDICATOR 8 (GLOVE) ×4
GLOVE ECLIPSE 7.5 STRL STRAW (GLOVE) ×10 IMPLANT
GLOVE SURG SS PI 7.0 STRL IVOR (GLOVE) ×10 IMPLANT
GOWN STRL REUS W/ TWL LRG LVL3 (GOWN DISPOSABLE) ×12 IMPLANT
GOWN STRL REUS W/TWL LRG LVL3 (GOWN DISPOSABLE) ×8
HOLDER TRACH TUBE VELCRO 19.5 (MISCELLANEOUS) ×5 IMPLANT
KIT BASIN OR (CUSTOM PROCEDURE TRAY) ×5 IMPLANT
KIT ROOM TURNOVER OR (KITS) ×5 IMPLANT
LIGASURE IMPACT 36 18CM CVD LR (INSTRUMENTS) IMPLANT
NS IRRIG 1000ML POUR BTL (IV SOLUTION) ×10 IMPLANT
PACK EENT II TURBAN DRAPE (CUSTOM PROCEDURE TRAY) IMPLANT
PACK GENERAL/GYN (CUSTOM PROCEDURE TRAY) ×10 IMPLANT
PAD ARMBOARD 7.5X6 YLW CONV (MISCELLANEOUS) ×10 IMPLANT
PENCIL BUTTON HOLSTER BLD 10FT (ELECTRODE) IMPLANT
PLUG CATH AND CAP STER (CATHETERS) ×5 IMPLANT
SEPRAFILM PROCEDURAL PACK 3X5 (MISCELLANEOUS) IMPLANT
SPECIMEN JAR LARGE (MISCELLANEOUS) IMPLANT
SPONGE ABDOMINAL VAC ABTHERA (MISCELLANEOUS) ×5 IMPLANT
SPONGE INTESTINAL PEANUT (DISPOSABLE) ×5 IMPLANT
SPONGE LAP 18X18 X RAY DECT (DISPOSABLE) ×5 IMPLANT
STAPLER GUN LINEAR PROX 60 (STAPLE) ×5 IMPLANT
STAPLER PROXIMATE 75MM BLUE (STAPLE) ×5 IMPLANT
STAPLER VISISTAT 35W (STAPLE) IMPLANT
SUCTION POOLE TIP (SUCTIONS) ×5 IMPLANT
SUT ETHILON 2 0 FS 18 (SUTURE) ×5 IMPLANT
SUT ETHILON 3 0 FSL (SUTURE) ×5 IMPLANT
SUT NOVA 1 T20/GS 25DT (SUTURE) IMPLANT
SUT PDS AB 1 TP1 96 (SUTURE) ×5 IMPLANT
SUT SILK 2 0 SH (SUTURE) ×5 IMPLANT
SUT SILK 2 0 SH CR/8 (SUTURE) ×15 IMPLANT
SUT SILK 2 0 TIES 10X30 (SUTURE) IMPLANT
SUT SILK 3 0 SH CR/8 (SUTURE) ×5 IMPLANT
SUT SILK 3 0 TIES 10X30 (SUTURE) IMPLANT
SUT VICRYL AB 3 0 TIES (SUTURE) IMPLANT
SYR 20CC LL (SYRINGE) IMPLANT
SYR TOOMEY 50ML (SYRINGE) ×5 IMPLANT
SYRINGE 10CC LL (SYRINGE) ×5 IMPLANT
TOWEL OR 17X24 6PK STRL BLUE (TOWEL DISPOSABLE) IMPLANT
TOWEL OR 17X26 10 PK STRL BLUE (TOWEL DISPOSABLE) ×5 IMPLANT
TRAY FOLEY CATH 16FRSI W/METER (SET/KITS/TRAYS/PACK) IMPLANT
TUBE CONNECTING 12'X1/4 (SUCTIONS)
TUBE CONNECTING 12X1/4 (SUCTIONS) IMPLANT
TUBE TRACH SHILEY  6 DIST  CUF (TUBING) IMPLANT
TUBE TRACH SHILEY 10 DIST CUFF (TUBING) IMPLANT
TUBE TRACH SHILEY 8 DIST CUF (TUBING) ×5 IMPLANT
WATER STERILE IRR 1000ML POUR (IV SOLUTION) IMPLANT
YANKAUER SUCT BULB TIP NO VENT (SUCTIONS) IMPLANT

## 2014-01-21 NOTE — Progress Notes (Signed)
Patient ID: [name] [name], male   DOB: Feb 26, 1971, 43 y.o.   MRN: [id number]   Subjective: Sedated on vent.  CRRT.    Objective:  Vital signs:  Filed Vitals:   02/10/2014 0630 01/12/2014 0645 01/13/2014 0700 02/06/2014 0715  BP:   104/78   Pulse: 82 81 80 79  Temp:      TempSrc:      Resp: 28 28 28 28   Height:      Weight:      SpO2: 97% 97% 96% 97%    Last BM Date: 02/03/2014  Intake/Output   Yesterday:  04/09 0701 - 04/10 0700 In: 5693.6 [I.V.:3491.6; IV Piggyback:1662; TPN:540] Out: 6202 [Emesis/NG output:250; Drains:5500] This shift:      Problem List:   Active Problems:   Sepsis   Protein-calorie malnutrition, severe   Septic shock(785.52)   Acute renal failure    Results:   Labs: Results for orders placed during the hospital encounter of 12/19/2013 (from the past 48 hour(s))  IRON AND TIBC     Status: Abnormal   Collection Time    02/09/2014  8:07 AM      Result Value Ref Range   Iron 117  42 - 135 ug/dL   TIBC 150 (*) 215 - 435 ug/dL   Saturation Ratios 78 (*) 20 - 55 %   UIBC 33 (*) 125 - 400 ug/dL   Comment: Performed at [place], [facility]     Status: None   Collection Time    02/02/2014  8:29 AM      Result Value Ref Range   Glucose-Capillary 73  70 - 99 mg/dL  PREPARE RBC (CROSSMATCH)     Status: None   Collection Time    01/17/2014  9:23 AM      Result Value Ref Range   Order Confirmation ORDER PROCESSED BY BLOOD BANK    PREPARE FRESH FROZEN PLASMA     Status: None   Collection Time    01/22/2014  9:24 AM      Result Value Ref Range   Unit Number [id number]     Blood Component Type THAWED PLASMA     Unit division 00     Status of Unit ISSUED,FINAL     Transfusion Status OK TO TRANSFUSE     Unit Number [id number]     Blood Component Type THAWED PLASMA     Unit division 00     Status of Unit ISSUED,FINAL     Transfusion Status OK TO TRANSFUSE     Unit Number [id number]     Blood Component Type THAWED PLASMA      Unit division 00     Status of Unit ISSUED,FINAL     Transfusion Status OK TO TRANSFUSE     Unit Number [id number]     Blood Component Type THAWED PLASMA     Unit division 00     Status of Unit ISSUED,FINAL     Transfusion Status OK TO TRANSFUSE    POCT I-STAT 3, BLOOD GAS (G3+)     Status: Abnormal   Collection Time    01/25/2014 11:33 AM      Result Value Ref Range   pH, Arterial 7.316 (*) 7.350 - 7.450   pCO2 arterial 47.7 (*) 35.0 - 45.0 mmHg   pO2, Arterial 62.0 (*) 80.0 - 100.0 mmHg   Bicarbonate 24.8 (*) 20.0 - 24.0 mEq/L   TCO2 26  0 - 100 mmol/L   O2  Saturation 91.0     Acid-base deficit 2.0  0.0 - 2.0 mmol/L   Patient temperature 95.7 F     Collection site ARTERIAL LINE     Drawn by Operator     Sample type ARTERIAL    GLUCOSE, CAPILLARY     Status: None   Collection Time    01/17/2014 11:51 AM      Result Value Ref Range   Glucose-Capillary 78  70 - 99 mg/dL  PREPARE RBC (CROSSMATCH)     Status: None   Collection Time    02/10/2014  2:09 PM      Result Value Ref Range   Order Confirmation ORDER PROCESSED BY BLOOD BANK    PREPARE PLATELET PHERESIS     Status: None   Collection Time    02/03/2014  2:09 PM      Result Value Ref Range   Unit Number [id number]     Blood Component Type PLTPHER LR1     Unit division 00     Status of Unit ISSUED,FINAL     Transfusion Status OK TO TRANSFUSE    CBC     Status: Abnormal   Collection Time    02/01/2014  2:11 PM      Result Value Ref Range   WBC 20.4 (*) 4.0 - 10.5 K/uL   RBC 4.06 (*) 4.22 - 5.81 MIL/uL   Hemoglobin 11.9 (*) 13.0 - 17.0 g/dL   Comment: POST TRANSFUSION SPECIMEN   HCT 33.7 (*) 39.0 - 52.0 %   MCV 83.0  78.0 - 100.0 fL   MCH 29.3  26.0 - 34.0 pg   MCHC 35.3  30.0 - 36.0 g/dL   RDW 14.3  11.5 - 15.5 %   Platelets 31 (*) 150 - 400 K/uL   Comment: REPEATED TO VERIFY     CONSISTENT WITH PREVIOUS RESULT  POCT I-STAT 3, BLOOD GAS (G3+)     Status: Abnormal   Collection Time    02/06/2014  2:14 PM       Result Value Ref Range   pH, Arterial 7.278 (*) 7.350 - 7.450   pCO2 arterial 55.8 (*) 35.0 - 45.0 mmHg   pO2, Arterial 63.0 (*) 80.0 - 100.0 mmHg   Bicarbonate 26.1 (*) 20.0 - 24.0 mEq/L   TCO2 28  0 - 100 mmol/L   O2 Saturation 88.0     Acid-base deficit 1.0  0.0 - 2.0 mmol/L   Patient temperature 98.6 F     Collection site ARTERIAL LINE     Drawn by Operator     Sample type ARTERIAL    LACTIC ACID, PLASMA     Status: Abnormal   Collection Time    01/31/2014  2:30 PM      Result Value Ref Range   Lactic Acid, Venous 3.0 (*) 0.5 - 2.2 mmol/L  PREPARE RBC (CROSSMATCH)     Status: None   Collection Time    02/09/2014  3:00 PM      Result Value Ref Range   Order Confirmation ORDER PROCESSED BY BLOOD BANK    POCT I-STAT, CHEM 8     Status: Abnormal   Collection Time    01/17/2014  3:06 PM      Result Value Ref Range   Sodium 135 (*) 137 - 147 mEq/L   Potassium 3.8  3.7 - 5.3 mEq/L   Chloride 104  96 - 112 mEq/L   BUN <3 (*) 6 - 23 mg/dL  Creatinine, Ser 0.70  0.50 - 1.35 mg/dL   Glucose, Bld 91  70 - 99 mg/dL   Calcium, Ion 0.93 (*) 1.12 - 1.23 mmol/L   TCO2 24  0 - 100 mmol/L   Hemoglobin 11.6 (*) 13.0 - 17.0 g/dL   HCT 34.0 (*) 39.0 - 52.0 %  GLUCOSE, CAPILLARY     Status: None   Collection Time    01/31/2014  3:36 PM      Result Value Ref Range   Glucose-Capillary 87  70 - 99 mg/dL  RENAL FUNCTION PANEL     Status: Abnormal   Collection Time    02/03/2014  4:00 PM      Result Value Ref Range   Sodium 138  137 - 147 mEq/L   Potassium 3.5 (*) 3.7 - 5.3 mEq/L   Chloride 101  96 - 112 mEq/L   CO2 20  19 - 32 mEq/L   Glucose, Bld 94  70 - 99 mg/dL   BUN 6  6 - 23 mg/dL   Creatinine, Ser 0.69  0.50 - 1.35 mg/dL   Calcium 7.2 (*) 8.4 - 10.5 mg/dL   Phosphorus 1.6 (*) 2.3 - 4.6 mg/dL   Albumin 2.1 (*) 3.5 - 5.2 g/dL   GFR calc non Af Amer >90  >90 mL/min   GFR calc Af Amer >90  >90 mL/min   Comment: (NOTE)     The eGFR has been calculated using the CKD EPI equation.     This  calculation has not been validated in all clinical situations.     eGFR's persistently <90 mL/min signify possible Chronic Kidney     Disease.  POCT I-STAT 3, BLOOD GAS (G3+)     Status: Abnormal   Collection Time    01/14/2014  4:27 PM      Result Value Ref Range   pH, Arterial 7.479 (*) 7.350 - 7.450   pCO2 arterial 34.0 (*) 35.0 - 45.0 mmHg   pO2, Arterial 346.0 (*) 80.0 - 100.0 mmHg   Bicarbonate 25.3 (*) 20.0 - 24.0 mEq/L   TCO2 26  0 - 100 mmol/L   O2 Saturation 100.0     Acid-Base Excess 2.0  0.0 - 2.0 mmol/L   Patient temperature 98.6 F     Collection site ARTERIAL LINE     Drawn by Operator     Sample type ARTERIAL    CBC     Status: Abnormal   Collection Time    01/22/2014  4:33 PM      Result Value Ref Range   WBC 19.4 (*) 4.0 - 10.5 K/uL   RBC 4.13 (*) 4.22 - 5.81 MIL/uL   Hemoglobin 12.2 (*) 13.0 - 17.0 g/dL   HCT 34.0 (*) 39.0 - 52.0 %   MCV 82.3  78.0 - 100.0 fL   MCH 29.5  26.0 - 34.0 pg   MCHC 35.9  30.0 - 36.0 g/dL   RDW 14.3  11.5 - 15.5 %   Platelets 30 (*) 150 - 400 K/uL   Comment: REPEATED TO VERIFY     CONSISTENT WITH PREVIOUS RESULT  GLUCOSE, CAPILLARY     Status: None   Collection Time    01/30/2014  7:18 PM      Result Value Ref Range   Glucose-Capillary 86  70 - 99 mg/dL  CBC     Status: Abnormal   Collection Time    01/14/2014 10:30 PM      Result Value Ref  Range   WBC 19.9 (*) 4.0 - 10.5 K/uL   RBC 3.71 (*) 4.22 - 5.81 MIL/uL   Hemoglobin 10.9 (*) 13.0 - 17.0 g/dL   HCT 30.2 (*) 39.0 - 52.0 %   MCV 81.4  78.0 - 100.0 fL   MCH 29.4  26.0 - 34.0 pg   MCHC 36.1 (*) 30.0 - 36.0 g/dL   RDW 14.3  11.5 - 15.5 %   Platelets 92 (*) 150 - 400 K/uL   Comment: REPEATED TO VERIFY     CONSISTENT WITH PREVIOUS RESULT  GLUCOSE, CAPILLARY     Status: None   Collection Time    01/20/14 12:19 AM      Result Value Ref Range   Glucose-Capillary 83  70 - 99 mg/dL  CBC     Status: Abnormal   Collection Time    01/20/14  2:30 AM      Result Value Ref Range    WBC 23.5 (*) 4.0 - 10.5 K/uL   RBC 3.71 (*) 4.22 - 5.81 MIL/uL   Hemoglobin 11.1 (*) 13.0 - 17.0 g/dL   HCT 30.1 (*) 39.0 - 52.0 %   MCV 81.1  78.0 - 100.0 fL   MCH 29.9  26.0 - 34.0 pg   MCHC 36.9 (*) 30.0 - 36.0 g/dL   RDW 14.5  11.5 - 15.5 %   Platelets 86 (*) 150 - 400 K/uL   Comment: CONSISTENT WITH PREVIOUS RESULT  GLUCOSE, CAPILLARY     Status: None   Collection Time    01/20/14  4:16 AM      Result Value Ref Range   Glucose-Capillary 82  70 - 99 mg/dL  MAGNESIUM     Status: None   Collection Time    01/20/14  4:33 AM      Result Value Ref Range   Magnesium 1.9  1.5 - 2.5 mg/dL  PHOSPHORUS     Status: Abnormal   Collection Time    01/20/14  4:33 AM      Result Value Ref Range   Phosphorus 0.8 (*) 2.3 - 4.6 mg/dL   Comment: CRITICAL RESULT CALLED TO, READ BACK BY AND VERIFIED WITH:     Ledell Noss (RN) 0701 01/20/2014 L. LOMAX  COMPREHENSIVE METABOLIC PANEL     Status: Abnormal   Collection Time    01/20/14  4:33 AM      Result Value Ref Range   Sodium 138  137 - 147 mEq/L   Potassium 3.6 (*) 3.7 - 5.3 mEq/L   Chloride 102  96 - 112 mEq/L   CO2 24  19 - 32 mEq/L   Glucose, Bld 86  70 - 99 mg/dL   BUN 4 (*) 6 - 23 mg/dL   Creatinine, Ser 0.50  0.50 - 1.35 mg/dL   Calcium 7.3 (*) 8.4 - 10.5 mg/dL   Total Protein 4.4 (*) 6.0 - 8.3 g/dL   Albumin 1.9 (*) 3.5 - 5.2 g/dL   AST 124 (*) 0 - 37 U/L   ALT 155 (*) 0 - 53 U/L   Alkaline Phosphatase 164 (*) 39 - 117 U/L   Total Bilirubin 7.1 (*) 0.3 - 1.2 mg/dL   GFR calc non Af Amer >90  >90 mL/min   GFR calc Af Amer >90  >90 mL/min   Comment: (NOTE)     The eGFR has been calculated using the CKD EPI equation.     This calculation has not been validated in all clinical situations.  eGFR's persistently <90 mL/min signify possible Chronic Kidney     Disease.  LACTIC ACID, PLASMA     Status: Abnormal   Collection Time    01/20/14  5:00 AM      Result Value Ref Range   Lactic Acid, Venous 2.7 (*) 0.5 - 2.2 mmol/L  CBC      Status: Abnormal   Collection Time    01/20/14  6:30 AM      Result Value Ref Range   WBC 19.6 (*) 4.0 - 10.5 K/uL   RBC 4.00 (*) 4.22 - 5.81 MIL/uL   Hemoglobin 11.9 (*) 13.0 - 17.0 g/dL   HCT 32.2 (*) 39.0 - 52.0 %   MCV 80.5  78.0 - 100.0 fL   MCH 29.8  26.0 - 34.0 pg   MCHC 37.0 (*) 30.0 - 36.0 g/dL   RDW 14.9  11.5 - 15.5 %   Platelets 85 (*) 150 - 400 K/uL   Comment: CONSISTENT WITH PREVIOUS RESULT  GLUCOSE, CAPILLARY     Status: None   Collection Time    01/20/14  8:35 AM      Result Value Ref Range   Glucose-Capillary 85  70 - 99 mg/dL  POCT I-STAT 3, BLOOD GAS (G3+)     Status: Abnormal   Collection Time    01/20/14  9:02 AM      Result Value Ref Range   pH, Arterial 7.530 (*) 7.350 - 7.450   pCO2 arterial 28.1 (*) 35.0 - 45.0 mmHg   pO2, Arterial 46.0 (*) 80.0 - 100.0 mmHg   Bicarbonate 24.0  20.0 - 24.0 mEq/L   TCO2 25  0 - 100 mmol/L   O2 Saturation 91.0     Acid-Base Excess 1.0  0.0 - 2.0 mmol/L   Patient temperature 94.0 F     Collection site ARTERIAL LINE     Drawn by RT     Sample type ARTERIAL    VANCOMYCIN, RANDOM     Status: None   Collection Time    01/20/14 10:00 AM      Result Value Ref Range   Vancomycin Rm 12.9     Comment:            Random Vancomycin therapeutic     range is dependent on dosage and     time of specimen collection.     A peak range is 20.0-40.0 ug/mL     A trough range is 5.0-15.0 ug/mL             GLUCOSE, CAPILLARY     Status: Abnormal   Collection Time    01/20/14 11:50 AM      Result Value Ref Range   Glucose-Capillary 107 (*) 70 - 99 mg/dL  CBC WITH DIFFERENTIAL     Status: Abnormal   Collection Time    01/20/14 12:23 PM      Result Value Ref Range   WBC 19.5 (*) 4.0 - 10.5 K/uL   RBC 3.83 (*) 4.22 - 5.81 MIL/uL   Hemoglobin 11.3 (*) 13.0 - 17.0 g/dL   HCT 30.9 (*) 39.0 - 52.0 %   MCV 80.7  78.0 - 100.0 fL   MCH 29.5  26.0 - 34.0 pg   MCHC 36.6 (*) 30.0 - 36.0 g/dL   RDW 14.8  11.5 - 15.5 %   Platelets 60  (*) 150 - 400 K/uL   Comment: CONSISTENT WITH PREVIOUS RESULT   Neutrophils Relative % 81 (*) 43 -  77 %   Neutro Abs 15.8 (*) 1.7 - 7.7 K/uL   Lymphocytes Relative 9 (*) 12 - 46 %   Lymphs Abs 1.8  0.7 - 4.0 K/uL   Monocytes Relative 7  3 - 12 %   Monocytes Absolute 1.4 (*) 0.1 - 1.0 K/uL   Eosinophils Relative 3  0 - 5 %   Eosinophils Absolute 0.5  0.0 - 0.7 K/uL   Basophils Relative 1  0 - 1 %   Basophils Absolute 0.2 (*) 0.0 - 0.1 K/uL   WBC Morphology INCREASED BANDS (>20% BANDS)     Comment: MILD LEFT SHIFT (1-5% METAS, OCC MYELO, OCC BANDS)     VACUOLATED NEUTROPHILS   RBC Morphology POLYCHROMASIA PRESENT    RENAL FUNCTION PANEL     Status: Abnormal   Collection Time    01/20/14  3:00 PM      Result Value Ref Range   Sodium 137  137 - 147 mEq/L   Potassium 3.7  3.7 - 5.3 mEq/L   Chloride 103  96 - 112 mEq/L   CO2 22  19 - 32 mEq/L   Glucose, Bld 105 (*) 70 - 99 mg/dL   BUN 3 (*) 6 - 23 mg/dL   Creatinine, Ser 0.47 (*) 0.50 - 1.35 mg/dL   Calcium 6.9 (*) 8.4 - 10.5 mg/dL   Phosphorus 1.9 (*) 2.3 - 4.6 mg/dL   Albumin 2.1 (*) 3.5 - 5.2 g/dL   GFR calc non Af Amer >90  >90 mL/min   GFR calc Af Amer >90  >90 mL/min   Comment: (NOTE)     The eGFR has been calculated using the CKD EPI equation.     This calculation has not been validated in all clinical situations.     eGFR's persistently <90 mL/min signify possible Chronic Kidney     Disease.  GLUCOSE, CAPILLARY     Status: Abnormal   Collection Time    01/20/14  3:42 PM      Result Value Ref Range   Glucose-Capillary 107 (*) 70 - 99 mg/dL  CBC     Status: Abnormal   Collection Time    01/20/14  5:00 PM      Result Value Ref Range   WBC 19.3 (*) 4.0 - 10.5 K/uL   RBC 3.81 (*) 4.22 - 5.81 MIL/uL   Hemoglobin 11.3 (*) 13.0 - 17.0 g/dL   HCT 30.7 (*) 39.0 - 52.0 %   MCV 80.6  78.0 - 100.0 fL   MCH 29.7  26.0 - 34.0 pg   MCHC 36.8 (*) 30.0 - 36.0 g/dL   RDW 14.8  11.5 - 15.5 %   Platelets 47 (*) 150 - 400 K/uL    Comment: CONSISTENT WITH PREVIOUS RESULT  PROTIME-INR     Status: Abnormal   Collection Time    01/20/14  5:00 PM      Result Value Ref Range   Prothrombin Time 18.9 (*) 11.6 - 15.2 seconds   INR 1.63 (*) 0.00 - 1.49  APTT     Status: Abnormal   Collection Time    01/20/14  5:00 PM      Result Value Ref Range   aPTT 40 (*) 24 - 37 seconds   Comment:            IF BASELINE aPTT IS ELEVATED,     SUGGEST PATIENT RISK ASSESSMENT     BE USED TO DETERMINE APPROPRIATE     ANTICOAGULANT THERAPY.  PHOSPHORUS     Status: Abnormal   Collection Time    01/20/14  5:00 PM      Result Value Ref Range   Phosphorus 1.1 (*) 2.3 - 4.6 mg/dL  MAGNESIUM     Status: None   Collection Time    01/20/14  5:00 PM      Result Value Ref Range   Magnesium 2.0  1.5 - 2.5 mg/dL  GLUCOSE, CAPILLARY     Status: Abnormal   Collection Time    01/20/14  7:14 PM      Result Value Ref Range   Glucose-Capillary 109 (*) 70 - 99 mg/dL  GLUCOSE, CAPILLARY     Status: Abnormal   Collection Time    01/15/2014 12:21 AM      Result Value Ref Range   Glucose-Capillary 205 (*) 70 - 99 mg/dL   Comment 1 Documented in Chart     Comment 2 Notify RN    GLUCOSE, CAPILLARY     Status: Abnormal   Collection Time    02/09/2014  3:55 AM      Result Value Ref Range   Glucose-Capillary 219 (*) 70 - 99 mg/dL  MAGNESIUM     Status: None   Collection Time    01/14/2014  5:00 AM      Result Value Ref Range   Magnesium 2.1  1.5 - 2.5 mg/dL  PHOSPHORUS     Status: Abnormal   Collection Time    01/20/2014  5:00 AM      Result Value Ref Range   Phosphorus 1.9 (*) 2.3 - 4.6 mg/dL  COMPREHENSIVE METABOLIC PANEL     Status: Abnormal   Collection Time    02/08/2014  5:00 AM      Result Value Ref Range   Sodium 135 (*) 137 - 147 mEq/L   Potassium 4.3  3.7 - 5.3 mEq/L   Chloride 100  96 - 112 mEq/L   CO2 22  19 - 32 mEq/L   Glucose, Bld 179 (*) 70 - 99 mg/dL   BUN 4 (*) 6 - 23 mg/dL   Creatinine, Ser 0.45 (*) 0.50 - 1.35 mg/dL    Calcium 7.5 (*) 8.4 - 10.5 mg/dL   Total Protein 4.5 (*) 6.0 - 8.3 g/dL   Albumin 1.9 (*) 3.5 - 5.2 g/dL   AST 90 (*) 0 - 37 U/L   ALT 136 (*) 0 - 53 U/L   Alkaline Phosphatase 151 (*) 39 - 117 U/L   Total Bilirubin 5.8 (*) 0.3 - 1.2 mg/dL   GFR calc non Af Amer >90  >90 mL/min   GFR calc Af Amer >90  >90 mL/min   Comment: (NOTE)     The eGFR has been calculated using the CKD EPI equation.     This calculation has not been validated in all clinical situations.     eGFR's persistently <90 mL/min signify possible Chronic Kidney     Disease.  APTT     Status: Abnormal   Collection Time    02/01/2014  5:00 AM      Result Value Ref Range   aPTT 38 (*) 24 - 37 seconds   Comment:            IF BASELINE aPTT IS ELEVATED,     SUGGEST PATIENT RISK ASSESSMENT     BE USED TO DETERMINE APPROPRIATE     ANTICOAGULANT THERAPY.  PROTIME-INR     Status: Abnormal   Collection Time  01/22/2014  5:00 AM      Result Value Ref Range   Prothrombin Time 17.0 (*) 11.6 - 15.2 seconds   INR 1.42  0.00 - 1.49  CBC     Status: Abnormal   Collection Time    01/20/2014  5:00 AM      Result Value Ref Range   WBC 16.9 (*) 4.0 - 10.5 K/uL   Comment: WHITE COUNT CONFIRMED ON SMEAR   RBC 3.83 (*) 4.22 - 5.81 MIL/uL   Hemoglobin 11.8 (*) 13.0 - 17.0 g/dL   HCT 31.2 (*) 39.0 - 52.0 %   MCV 81.5  78.0 - 100.0 fL   MCH 30.8  26.0 - 34.0 pg   MCHC 37.8 (*) 30.0 - 36.0 g/dL   Comment: HEMOLYSIS AT THIS LEVEL MAY AFFECT RESULT     LIPEMIC SPECIMEN   RDW 15.4  11.5 - 15.5 %   Platelets 44 (*) 150 - 400 K/uL   Comment: PLATELET COUNT CONFIRMED BY SMEAR    Imaging / Studies: Dg Chest Port 1 View  01/15/2014   CLINICAL DATA:  Endotracheal tube.  Pleural effusion.  EXAM: PORTABLE CHEST - 1 VIEW  COMPARISON:  01/20/2014  FINDINGS: Endotracheal tube has its tip 4 cm above the carina. Nasogastric tube enters the stomach. Right internal jugular central line has its tip at the azygos level of the SVC. Left internal jugular  central line has its tip in the SVC 4 cm above the right atrium. Pneumonia throughout both lower lungs process, right more than left. Small amount of pleural fluid again seen on the right. Findings are quite similar compared to the previous study. No new finding.  IMPRESSION: Lines and tubes well positioned. Persistent bilateral pulmonary infiltrates right worse than left.   Electronically Signed   By: Nelson Chimes M.D.   On: 01/22/2014 07:11   Dg Chest Port 1 View  01/20/2014   CLINICAL DATA:  Dyspnea  EXAM: PORTABLE CHEST - 1 VIEW  COMPARISON:  DG CHEST 1V PORT dated 02/10/2014  FINDINGS: A left internal jugular catheter is appreciated tip projecting regions superior vena cava. A right internal jugular sheath is demonstrated unchanged. Endotracheal tube tip 5 cm above the carina. NG tube tip not viewed on this study. Cardiac silhouette within normal limits. There is slight increase in the bilateral pulmonary opacities. Blunting of the right costophrenic angle is appreciated. No acute osseous abnormalities.  IMPRESSION: Slightly increased conspicuity of the bilateral infiltrates with greatest confluence of the lung bases right greater than left. Small right effusion.  Support apparatus  adequately position.   Electronically Signed   By: Margaree Mackintosh M.D.   On: 01/20/2014 07:25   Dg Abd Portable 1v  01/20/2014   CLINICAL DATA:  Recent small bowel resection  EXAM: PORTABLE ABDOMEN - 1 VIEW  COMPARISON:  CT ABD/PELV WO CM dated 01/17/2014  FINDINGS: There is appreciated within nondilated loops of large small bowel. An NG tube appreciated tip projecting in the expected region of stomach. A catheter projects in the central portion of the abdomen may represent a post surgical drainage catheter. Osseous structures unremarkable.  IMPRESSION: Nonobstructive bowel gas pattern.   Electronically Signed   By: Margaree Mackintosh M.D.   On: 01/20/2014 07:27    Medications / Allergies: per chart  Antibiotics: Anti-infectives    Start     Dose/Rate Route Frequency Ordered Stop   01/20/14 1300  vancomycin (VANCOCIN) IVPB 1000 mg/200 mL premix     1,000  mg 200 mL/hr over 60 Minutes Intravenous Every 24 hours 01/20/14 1152     01/27/2014 0000  meropenem (MERREM) 1 g in sodium chloride 0.9 % 100 mL IVPB     1 g 200 mL/hr over 30 Minutes Intravenous Every 12 hours 01/17/14 1405     01/17/14 1300  vancomycin (VANCOCIN) IVPB 750 mg/150 ml premix  Status:  Discontinued     750 mg 150 mL/hr over 60 Minutes Intravenous Every 24 hours 01/17/14 1211 01/20/14 1152   01/17/14 1000  meropenem (MERREM) 2 g in sodium chloride 0.9 % 100 mL IVPB  Status:  Discontinued     2 g 200 mL/hr over 30 Minutes Intravenous Every 18 hours 01/17/14 0913 01/17/14 1405   01/17/14 0000  vancomycin (VANCOCIN) 50 mg/mL oral solution 500 mg  Status:  Discontinued     500 mg Oral 4 times per day 01/16/14 2008 01/20/2014 0952   01/17/14 0000  piperacillin-tazobactam (ZOSYN) IVPB 3.375 g  Status:  Discontinued     3.375 g 100 mL/hr over 30 Minutes Intravenous 4 times per day 01/16/14 2202 01/17/14 0854   01/16/14 2200  metroNIDAZOLE (FLAGYL) tablet 500 mg  Status:  Discontinued     500 mg Oral 3 times per day 01/16/14 1905 01/16/14 2002   01/16/14 2015  metroNIDAZOLE (FLAGYL) IVPB 500 mg  Status:  Discontinued     500 mg 100 mL/hr over 60 Minutes Intravenous Every 8 hours 01/16/14 2002 01/17/2014 1001   01/16/14 2000  vancomycin (VANCOCIN) 50 mg/mL oral solution 250 mg  Status:  Discontinued     250 mg Oral 4 times per day 01/16/14 1957 01/16/14 2008   01/16/14 2000  micafungin (MYCAMINE) 100 mg in sodium chloride 0.9 % 100 mL IVPB  Status:  Discontinued     100 mg 100 mL/hr over 1 Hours Intravenous Daily 01/16/14 1957 01/17/2014 1117   01/16/14 1800  piperacillin-tazobactam (ZOSYN) IVPB 3.375 g  Status:  Discontinued     3.375 g 12.5 mL/hr over 240 Minutes Intravenous 4 times per day 01/16/14 1648 01/16/14 2202   01/12/14 1200  piperacillin-tazobactam  (ZOSYN) IVPB 2.25 g  Status:  Discontinued     2.25 g 100 mL/hr over 30 Minutes Intravenous 4 times per day 01/12/14 0844 01/16/14 1648   01/12/14 0500  vancomycin (VANCOCIN) 500 mg in sodium chloride 0.9 % 100 mL IVPB  Status:  Discontinued     500 mg 100 mL/hr over 60 Minutes Intravenous Every 24 hours 01/11/14 0902 01/12/14 1242   01/10/14 0400  vancomycin (VANCOCIN) IVPB 750 mg/150 ml premix  Status:  Discontinued     750 mg 150 mL/hr over 60 Minutes Intravenous Every 24 hours 01/09/14 0353 01/11/14 0902   01/09/14 1200  piperacillin-tazobactam (ZOSYN) IVPB 3.375 g  Status:  Discontinued     3.375 g 12.5 mL/hr over 240 Minutes Intravenous Every 8 hours 01/09/14 0353 01/12/14 0844   01/09/14 0330  piperacillin-tazobactam (ZOSYN) IVPB 3.375 g     3.375 g 100 mL/hr over 30 Minutes Intravenous  Once 01/09/14 0329 01/09/14 0358   01/09/14 0330  vancomycin (VANCOCIN) IVPB 1000 mg/200 mL premix     1,000 mg 200 mL/hr over 60 Minutes Intravenous  Once 01/09/14 0329 01/09/14 0437      Assessment/Plan Ischemic small bowel sepsis Open abdomen OR this AM for VAC change and possible bowel anastomosis  Erby Pian, [facility] Pager [phone] Office [phone]  02/10/2014 7:41 AM   .

## 2014-01-21 NOTE — Op Note (Signed)
OPERATIVE REPORT  DATE OF OPERATION:  01/22/2014  PATIENT:  [name] [name]  43 y.o. male  PRE-OPERATIVE DIAGNOSIS:  OPEN ABDOMEN, RESPIRATORY FAILURE  POST-OPERATIVE DIAGNOSIS:  OPEN ABDOMEN, RESPIRATORY FAILURE  PROCEDURE:  Procedure(s): TRACHEOSTOMY Partial Small Bowel Resection and Repair and Re-anastomosis GASTROSTOMY APPLICATION OF WOUND VAC  SURGEON:  Surgeon(s): Gwenyth Ober, MD  ASSISTANT: nONE  ANESTHESIA:   general  EBL: <50 ml  BLOOD ADMINISTERED: none  DRAINS: Gastrostomy Tube   SPECIMEN:  Source of Specimen:  NECROTIC SMALL BOWEL  COUNTS CORRECT:  YES  PROCEDURE DETAILS: The patient was taken directly to the operating room from the intensive care unit on the ventilator and with an open abdomen. After proper time out was before identifying the patient and the procedure to be performed, we started off to the tracheostomy because of respiratory failure and chronic ventilatory support.  A roll was placed underneath the patient's shoulder and his neck was hyperextended. He prepped and draped his neck in the usual sterile manner. A transverse incision was made approximately 4 cm long and taken down to subcutaneous tissue. We dissected down to the pretracheal fascia where the second tracheal ring was identified. A tracheal hook was placed on the cricoid cartilage proximally. We placed 2 stay sutures around the second tracheal he of 2-0 silk. We subsequently made in the inferior tracheotomy flap using a #11 blade. Using a tracheotomy spreader we identified the endotracheal tube and the tracheal lumen. We subsequently passed #8 Shiley tracheostomy tube into the tracheotomy and secured in place with 3-0 nylon sutures. Once the tracheostomy was secured and confirmed to be in adequate position by carbon dioxide return the plate attention to the open abdomen for further care.  A second time out was performed. The outer dressing was removed and the patient prepped and draped in  usual sterile manner. Upon exploring the abdomen there was some oozing from the subcutaneous tissue and the muscle however the intra-abdominal contents appear to be completely viable with the exception of a small patch of small bowel on the proximal male which was resected and reanastomosed using interrupted 2-0 silk sutures. This patch of necrotic small bowel was sent as a specimen.  We did an end-to-end functional side-to-side anastomosis using a GIA-75 stapler between the disconnected and the small bowel. The resulting enterotomy was closed using ATX-60 stapler. The mesentery was reapproximated using 2-0 silk. We irrigated with saline solution and then performed a Stamm gastrostomy tube an anterior gastric wall using concentric sutures of 2-0 silk. The 24 French Mallinckrodt tube was passed into the stomach the was brought out the left upper quadrant and secured in place using a 2-0 nylon suture. It was also tacked to the abdominal wall using interrupted 2-0 silk sutures.  Once this was done the abdomen was nice and soft and we reapproximated the midline fascia using running looped #1 PDS suture. Subcutaneous tissue was left open and a negative pressure wound dressing was placed in the subcutaneous.  All counts were correct. We irrigated the gastrostomy tube successfully.  PATIENT DISPOSITION:  ICU - intubated and critically ill.   Gwenyth Ober 4/10/20155:48 PM

## 2014-01-21 NOTE — Progress Notes (Signed)
PULMONARY / CRITICAL CARE MEDICINE   Name: [name] [name] MRN: [id number] DOB: 09-17-1971    ADMISSION DATE:  12/29/2013  PRIMARY SERVICE: PCCM  CHIEF COMPLAINT:  SOB, leg pain.  BRIEF PATIENT DESCRIPTION:  43 years old male resident of an assisted living facility with PMH relevant for schizoaffective disorder, polysubstance abuse, seizures, COPD,  malnutrition, GERD. Presented with SOB and found to be hypotensive despite 2 L of IVF's. Started on dopamine via peripheral IV by ED and  PCCM called to admit. No clear source of infection.   SIGNIFICANT EVENTS / STUDIES:  3/29 Admitted to PCCM with septic shock and HCAP. Resuscitated with IVFs, vasopressors. Intubated shortly after admission. Bicarbonate infusion initiated for severe metabolic acidosis 5/57 Persistent septic shock. Worsening renal function. Acidosis improved. HCO3 stopped 3/31 Off vasopressors. Severe agitation @ times. Fentanyl and dex infusions initiated. Not able to F/C. Oliguric with worsening renal indices. Foley cath changed out without improvement in Uo. No indication for HD at this time 4/1 Close to extubation, urine output slightly improved, will need to contact wife for discussion of goals of care 4/2 Still agitated, self-extubated, re-intubated successfully. Continued worsening renal function. No HCPA or family/significant other contact. Will consult with ethics committee to resume responsibility for [facility] decisions.  4/3 Renal function no worse. Hepatic function improving.  4/4 Started phenylephrine  4/5 Continued hypotension and oliguria. Unequal pupils. Severe metabolic acidosis, started on bicarb drip and CRRT. Started levophed. Started stress-steroids. Panculture for source of infection (likely abdominal). 4/6  Slowly improving acidosis on CRRT.  4/7 Improving acidosis, CT head w/o acute abnormality 4/6 CT abdo/pelvis distended and fluid-filled small bowel and colon  4/6 CT chest  large bilateral pleural effusions  with overlying atelectasis 4/7 Ischemic small bowel resection in OR. Transfused with ffp, vit K, and pRBCs  4/8 Bleeding with thrombocytopenia. Transfused with platelets and pRBC. Bedside wound vac change 4/9 TPN started 4/10 To go to OR for VAC change and  possible bowel anastomosis. Also possible tracheostomy in OR.  LINES / TUBES: L IJ CVL 3/29 >>  ETT 3/29 >> 4/2, 4/2 (self extubated and re-intubated) >>   HD RIJ catheter 4/5 >> A-line 4/5 >> Foley   CULTURES: MRSA PCR 3/29 >> POS Urine 3/29 >> NG Blood 3/28 >> NG Blood 3/29 >> NG Blood 4/5 >>NGTD Urine 4/5 >>NG Tracheal 3/31 >>  NG C diff 4/3 >> Neg C diff 4/5 >> Neg Gi Pathogen 4/5>> Neg  Tracheal 4/5 >>moderate Candida Albicans  ANTIBIOTICS: IV Vanc 3/29 >> 4/2; 4/6 >> Pip-tazo 3/29 >> 4/6 IV flagyl 4/5 >>4/7 PO vanc 4/6 >>4/7 IV micafungin 4/5 >>4/8 Meropenem 4/7 >>  SUBJECTIVE:  No acute events overnight.  Pt sedated. Still with output of bloody drainage from wound vac. Diarrhea improved.  VITAL SIGNS: Temp:  [90.9 F (32.7 C)-94 F (34.4 C)] 94 F (34.4 C) (04/10 0400) Pulse Rate:  [79-99] 79 (04/10 0715) Resp:  [17-32] 28 (04/10 0715) BP: (81-123)/(43-92) 104/78 mmHg (04/10 0700) SpO2:  [84 %-100 %] 97 % (04/10 0715) Arterial Line BP: (75-138)/(36-73) 133/70 mmHg (04/10 0715) FiO2 (%):  [30 %-100 %] 30 % (04/10 0600) Weight:  [63.2 kg (139 lb 5.3 oz)] 63.2 kg (139 lb 5.3 oz) (04/10 0400) HEMODYNAMICS: CVP:  [4 mmHg-9 mmHg] 7 mmHg VENTILATOR SETTINGS: Vent Mode:  [-] PRVC FiO2 (%):  [30 %-100 %] 30 % Set Rate:  [28 bmp] 28 bmp Vt Set:  [570 mL] 570 mL PEEP:  [5 [place]  Pressure:  [21 cmH20-27 cmH20] 24 cmH20 INTAKE / OUTPUT: Intake/Output     04/09 0701 - 04/10 0700 04/10 0701 - 04/11 0700   I.V. (mL/kg) 3491.6 (55.2)    Blood     IV Piggyback 1662    TPN 540    Total Intake(mL/kg) 5693.6 (90.1)    Urine (mL/kg/hr)     Emesis/NG output 250 (0.2)    Drains 5500 (3.6)     Other 452 (0.3)    Total Output 6202     Net -508.4            PHYSICAL EXAMINATION: General: critically ill, alert, sedated  HEENT: WNL Cardiac: RRR  Lungs: Clear to ausculation in anterior fields   Abdomen: firm, distended, no BS, wound vac in place Ext: SCD's in place, cool, +3-4 edema up to abdomen GU: scrotal swelling Neuro: Unequal pupil  5 mm L>3 mm   PULMONARY  Recent Labs Lab 01/25/2014 1600 01/17/2014 1133 01/29/2014 1414 02/04/2014 1506 02/01/2014 1627 01/20/14 0902  PHART 7.312* 7.316* 7.278*  --  7.479* 7.530*  PCO2ART 51.7* 47.7* 55.8*  --  34.0* 28.1*  PO2ART 274.0* 62.0* 63.0*  --  346.0* 46.0*  HCO3 26.2* 24.8* 26.1*  --  25.3* 24.0  TCO2 28 26 28 24 26 25   O2SAT 100.0 91.0 88.0  --  100.0 91.0    CBC  Recent Labs Lab 01/20/14 1223 01/20/14 1700 01/20/2014 0500  HGB 11.3* 11.3* 11.8*  HCT 30.9* 30.7* 31.2*  WBC 19.5* 19.3* 16.9*  PLT 60* 47* 44*    COAGULATION  Recent Labs Lab 02/04/2014 0430 02/06/2014 1330 01/14/2014 0430 01/20/14 1700 01/14/2014 0500  INR 2.28* 1.91* 1.92* 1.63* 1.42     CHEMISTRY  Recent Labs Lab 01/29/2014 0430  01/25/2014 0430 01/20/2014 1506 01/12/2014 1600 01/20/14 0433 01/20/14 1500 01/20/14 1700 02/10/2014 0500  NA 136*  < > 137 135* 138 138 137  --  135*  K 3.9  < > 4.0 3.8 3.5* 3.6* 3.7  --  4.3  CL 97  < > 103 104 101 102 103  --  100  CO2 24  < > 26  --  20 24 22   --  22  GLUCOSE 124*  < > 138* 91 94 86 105*  --  179*  BUN 14  < > 7 <3* 6 4* 3*  --  4*  CREATININE 1.20  < > 0.67 0.70 0.69 0.50 0.47*  --  0.45*  CALCIUM 6.5*  < > 6.7*  --  7.2* 7.3* 6.9*  --  7.5*  MG 2.0  --  2.0  --   --  1.9  --  2.0 2.1  PHOS 1.5*  < > 1.3*  --  1.6* 0.8* 1.9* 1.1* 1.9*  < > = values in this interval not displayed. Estimated Creatinine Clearance: 106.4 ml/min (by C-G formula based on Cr of 0.45).   LIVER  Recent Labs Lab 01/17/14 1030  02/06/2014 0430 02/06/2014 1330  02/03/2014 0430 02/08/2014 1600 01/20/14 0433  01/20/14 1500 01/20/14 1700 01/27/2014 0500  AST 693*  --  351*  --   --  171*  --  124*  --   --  90*  ALT 669*  --  495*  --   --  252*  --  155*  --   --  136*  ALKPHOS 165*  --  138*  --   --  111  --  164*  --   --  151*  BILITOT 1.6*  --  1.8*  --   --  4.5*  --  7.1*  --   --  5.8*  PROT 5.1*  --  4.3*  --   --  4.1*  --  4.4*  --   --  4.5*  ALBUMIN 1.3*  < > 1.2*  --   < > 1.6*  1.5* 2.1* 1.9* 2.1*  --  1.9*  INR  --   --  2.28* 1.91*  --  1.92*  --   --   --  1.63* 1.42  < > = values in this interval not displayed.   INFECTIOUS  Recent Labs Lab 01/15/14 1433 01/16/14 0500  01/17/14 0447  01/31/2014 0430 01/25/2014 1430 01/20/14 0500  LATICACIDVEN  --   --   < >  --   < > 2.6* 3.0* 2.7*  PROCALCITON 2.43 3.50  --  2.66  --   --   --   --   < > = values in this interval not displayed.   ENDOCRINE CBG (last 3)   Recent Labs  01/20/14 1914 01/31/2014 0021 01/31/2014 0355  GLUCAP 109* 205* 219*     IMAGING x48h  Dg Chest Port 1 View  01/16/2014   CLINICAL DATA:  Endotracheal tube.  Pleural effusion.  EXAM: PORTABLE CHEST - 1 VIEW  COMPARISON:  01/20/2014  FINDINGS: Endotracheal tube has its tip 4 cm above the carina. Nasogastric tube enters the stomach. Right internal jugular central line has its tip at the azygos level of the SVC. Left internal jugular central line has its tip in the SVC 4 cm above the right atrium. Pneumonia throughout both lower lungs process, right more than left. Small amount of pleural fluid again seen on the right. Findings are quite similar compared to the previous study. No new finding.  IMPRESSION: Lines and tubes well positioned. Persistent bilateral pulmonary infiltrates right worse than left.   Electronically Signed   By: Nelson Chimes M.D.   On: 02/02/2014 07:11   Dg Chest Port 1 View  01/20/2014   CLINICAL DATA:  Dyspnea  EXAM: PORTABLE CHEST - 1 VIEW  COMPARISON:  DG CHEST 1V PORT dated 01/21/2014  FINDINGS: A left internal jugular catheter is  appreciated tip projecting regions superior vena cava. A right internal jugular sheath is demonstrated unchanged. Endotracheal tube tip 5 cm above the carina. NG tube tip not viewed on this study. Cardiac silhouette within normal limits. There is slight increase in the bilateral pulmonary opacities. Blunting of the right costophrenic angle is appreciated. No acute osseous abnormalities.  IMPRESSION: Slightly increased conspicuity of the bilateral infiltrates with greatest confluence of the lung bases right greater than left. Small right effusion.  Support apparatus  adequately position.   Electronically Signed   By: Margaree Mackintosh M.D.   On: 01/20/2014 07:25   Dg Abd Portable 1v  01/20/2014   CLINICAL DATA:  Recent small bowel resection  EXAM: PORTABLE ABDOMEN - 1 VIEW  COMPARISON:  CT ABD/PELV WO CM dated 01/17/2014  FINDINGS: There is appreciated within nondilated loops of large small bowel. An NG tube appreciated tip projecting in the expected region of stomach. A catheter projects in the central portion of the abdomen may represent a post surgical drainage catheter. Osseous structures unremarkable.  IMPRESSION: Nonobstructive bowel gas pattern.   Electronically Signed   By: Margaree Mackintosh M.D.   On: 01/20/2014 07:27     ASSESSMENT / PLAN:  PULMONARY A:  Ventilator Dependent Respiratory Failure    - concern for ARDS Large bilateral effusions - thoracentesis?  - 2D- echo with no CHF  HCAP     -bilateral lower lobes R>L COPD - emphysema alkalosis P:   Continue ventilator support, consider SBT after ph corrected ARDS protocol, abg reviewed, reduce rate 20, abg to follow Albuterol neb and duoneb PRN, limit when we can in ards Follow pcxr   Consider tracheostomy in OR today   CARDIOVASCULAR A:  Septic shock on pressor support Hypovolemia as well contributing from abdo output Adrenal Insufficency    -AM cortisol 14.4 P:  -Levophed started 4/5 to maintain MAP > 60 mm Hg -Consider  vasopressin if pressures still low -IV hydrocortisone 50 mg Q 6 hr started 4/9, maintain -bolus and assess response -add maintenance volume to match output and beat by 50 cc  RENAL A:   Acute kidney failure, oligo-anuric -  likely due to ATN (severe sepsis)     -Cr wnl, still oliguric on CRRT Anion Gap Metabolic Acidosis due to lactic acidosis - resolved  Hypokalemia  Hypocalcemia Hypophosphatemia P:   Renal following Continue CRRT Avoid nephrotoxins keep foley Add fluids plus 50 cc/hr over output Attempt albumin  GASTROINTESTINAL A:   Ischemic Small Bowel s/p resection   -to OR today for possible anastomosis  Acute liver failure due to severe sepsis   - improving function Mild pancreatitis - resolved GERD  P:  Surgery following NPO, bowel rest Fentanyl infusion pain TPN started 4/9 Hold tube feedings osmolite and pro-stat initiated 3/31 Wound vac changes per surgery  HEMATOLOGIC A:   Thrombocytopenia s/p transfusion   - active bleeding,  likely due to liver dysfunction and severe sepsis, not DIC (factor 8 wnl) Acute on Chronic Normocytic Anemia s/p transfusion     -Hg stable  Coagulapathy   - INR and PTT normalizing  P:  Hold heparin, F/U HIT panel  Transfuse platelets, vitamin K,  ffp, pRBC as needed  Monitor CBC Maintain SCD's Check b/l DUS   For plat  INFECTIOUS A:   Severe sepsis   - afebrile, improving leukocytosis  Leukocytosis   RLL HCAP, NOS P:   Cont meropenem, IV vancomycin, remains some hypothermia on cvvhd Consider dc vanc if no mrsa or enteroccus in am F/U HIV - NR F/U US scrotum  ENDOCRINE A:   Adrenal Insufficiency   -AM cortisol 14 Hypothyroidism   - TSH wnl Hypo/hyper glycemia P:   Cont L-thyroxine  SSI with CBG Q 4 hr D50 as needed for CBG <70 IV hydrocortisone 50 mg Q 6 hr started 4/9 F/U AM ACTH level   NEUROLOGIC/PSYCHOSOCIAL A:   Anisocoria at baseline? Acute encephalopathy with severe agitation Chronic b/l  Cerebellar Encephalomalacia  Schizoaffective disorder Seizure disorder No HCPOA identified P:   Fentanyl infusion and PRN pain  Hold home Remeron, Zyprexa, Cogentin  IV phenytoin per pharmacy, consider restart WUA daily if able to tolerate Continue soft restraints  Post trach goal dc versed, pending belly  Marjan Rabbani PGY1- IMTS  02/02/2014 8:21 AM  I have fully examined this patient and agree with above findings.    And edited infull  Ccm time 35 min   Lavon Paganini. Titus Mould, MD, [place] Pgr: [facility]

## 2014-01-21 NOTE — OR Nursing (Addendum)
Called 2100 @1545  with 52minute notice. Tracheostomy [id number]

## 2014-01-21 NOTE — Anesthesia Preprocedure Evaluation (Signed)
Anesthesia Evaluation    Airway       Dental   Pulmonary Current Smoker,          Cardiovascular     Neuro/Psych    GI/Hepatic   Endo/Other    Renal/GU      Musculoskeletal   Abdominal   Peds  Hematology   Anesthesia Other Findings   Reproductive/Obstetrics                           Anesthesia Physical Anesthesia Plan  ASA: IV  Anesthesia Plan: General   Post-op Pain Management:    Induction:   Airway Management Planned: Oral ETT  Additional Equipment: Arterial line and CVP  Intra-op Plan:   Post-operative Plan: Post-operative intubation/ventilation  Informed Consent: I have reviewed the patients History and Physical, chart, labs and discussed the procedure including the risks, benefits and alternatives for the proposed anesthesia with the patient or authorized representative who has indicated his/her understanding and acceptance.     Plan Discussed with: CRNA and Anesthesiologist  Anesthesia Plan Comments:         Anesthesia Quick Evaluation

## 2014-01-21 NOTE — Progress Notes (Signed)
PT/INR much improved.  Platelet count down to 44K.  Will have platelets available for OR.  Will also perform tracheostomy  Kathryne Eriksson. Dahlia Bailiff, MD, [place] [phone] [facility] Surgery

## 2014-01-21 NOTE — Progress Notes (Signed)
*  PRELIMINARY RESULTS* Vascular Ultrasound Bilateral lower extremity venous duplex has been completed.  Preliminary findings: Right = no evidence of DVT. Left = DVT involving the mid to distal femoral vein, popliteal vein, and posterior tibial veins.   Chapman Fitch RVT 01/16/2014, 12:55 PM

## 2014-01-21 NOTE — Progress Notes (Signed)
NUTRITION FOLLOW UP  Intervention:    TPN dosing per Pharmacy to meet > 90% of estimated nutrition needs as able.  Nutrition Dx:   Inadequate oral intake related to inability to eat as evidenced by NPO status, ongoing.  Goal:   Patient will meet >/=90% of estimated nutrition needs, unmet.  Monitor:   Vent status, ability to restart enteral nutrition, bowel function, weight trend, labs.  Assessment:   PMHx significant for schizoaffective disorder, HOH, polysubstance abuse, constipation. From facility. Pt is wheel-chair bound, per hx pt is very conversant and social at his facility. Admitted on 3/28 with septic shock and HCAP, required intubation on admission.  Patient was previously on TF of Osmolite 1.2. Developed distended abdomen. CT abd/pelvis on 4/6 showed fluid filled small bowel and colon. S/P ischemic small bowel resection in OR on 4/7. VAC is in place to abdominal wound. TPN started on 4/9. Plans for return to OR for VAC change and possible bowel anastomosis today. Also possible tracheostomy in OR today. +3-4 edema up to abdomen.  Patient is currently intubated on ventilator support. MV: 8.5 L/min Temp (24hrs), Avg:93.1 F (33.9 C), Min:90.9 F (32.7 C), Max:94 F (34.4 C)   TPN was initiated yesterday. Patient is receiving TPN with Clinimix E 5/15 increasing to 50 ml/hr today and lipids @ 10 ml/hr. Provides 1440 ml, 1332 kcal, and 60 grams protein per day. Meets 78% minimum estimated energy needs and 71% minimum estimated protein needs.  Height: Ht Readings from Last 1 Encounters:  01/09/14 6\' 2"  (1.88 m)    Weight Status:   Wt Readings from Last 1 Encounters:  02/04/2014 139 lb 5.3 oz (63.2 kg)  01/27/2014  159 lb 13.3 oz (72.5 kg)   Body mass index is 17.88 kg/(m^2).  Re-estimated needs:  Kcal: 1700 kcal Protein: 85-100 grams  Fluid: >1.7 L/day  Skin: 3-4+ edema  Diet Order:  NPO   Intake/Output Summary (Last 24 hours) at 02/09/2014 1115 Last data filed at  02/04/2014 0800  Gross per 24 hour  Intake 4361.89 ml  Output   5123 ml  Net -761.11 ml    Last BM: 4/3   Labs:   Recent Labs Lab 01/20/14 0433 01/20/14 1500 01/20/14 1700 02/03/2014 0500  NA 138 137  --  135*  K 3.6* 3.7  --  4.3  CL 102 103  --  100  CO2 24 22  --  22  BUN 4* 3*  --  4*  CREATININE 0.50 0.47*  --  0.45*  CALCIUM 7.3* 6.9*  --  7.5*  MG 1.9  --  2.0 2.1  PHOS 0.8* 1.9* 1.1* 1.9*  GLUCOSE 86 105*  --  179*    CBG (last 3)   Recent Labs  01/13/2014 0021 02/07/2014 0355 01/14/2014 0826  GLUCAP 205* 219* 134*    Scheduled Meds: . antiseptic oral rinse  15 mL Mouth Rinse QID  . chlorhexidine  15 mL Mouth Rinse BID  . hydrocortisone sod succinate (SOLU-CORTEF) inj  50 mg Intravenous Q6H  . insulin aspart  0-9 Units Subcutaneous 6 times per day  . ipratropium-albuterol  3 mL Nebulization Q6H  . levothyroxine  37.5 mcg Intravenous Daily  . meropenem (MERREM) IV  1 g Intravenous Q12H  . pantoprazole (PROTONIX) IV  40 mg Intravenous Q24H  . phenytoin (DILANTIN) IV  50 mg Intravenous BID  . sodium phosphate  Dextrose 5% IVPB  20 mmol Intravenous Once  . vancomycin  1,000 mg Intravenous Q24H  Continuous Infusions: . sodium chloride 20 mL/hr (01/31/2014 0945)  . sodium chloride 250 mL/hr (01/25/2014 1114)  . Marland KitchenTPN (CLINIMIX-E) Adult     And  . fat emulsion    . Marland KitchenTPN (CLINIMIX-E) Adult 30 mL/hr at 01/20/14 1730   And  . fat emulsion 250 mL (01/20/14 1730)  . fentaNYL infusion INTRAVENOUS 200 mcg/hr (01/29/2014 0916)  . midazolam (VERSED) infusion 2 mg/hr (01/20/14 1300)  . norepinephrine (LEVOPHED) Adult infusion 20 mcg/min (01/20/2014 0700)  . dialysis replacement fluid (prismasate) 800 mL/hr at 02/01/2014 0650  . dialysis replacement fluid (prismasate) 700 mL/hr at 02/03/2014 0347  . dialysate (PRISMASATE) 1,500 mL/hr at 02/08/2014 [place], RD, LDN, [place] Pager [phone] After Hours Pager [phone]

## 2014-01-21 NOTE — H&P (Signed)
[name] [name] is an 43 y.o. male.   Chief Complaint: pt lives in an assisted living facility Admitted 3/30 SOB; hypotension Developed severe sepsis with HCAP On vent for resp failure; and vasopressors Developed ischemic bowel--surgery 4/7 for partial bowel removal Continues to actively bleed; thrombocytopenia secondary sepsis? Now taken back to OR for possible additional surgery for bowel and trach placement New development of LLE DVT--immobile Pt  thrombocytopenia--cannot anticoagulate Scheduled now for retrievable inferior vena cava filter placement  Pt in OR; unstable  HPI: Sz disorder; schizophrenia; polysubs abuse; thrombocytopenia;   Past Medical History  Diagnosis Date  . Chronic pain   . Anxiety   . Schizoaffective disorder   . Hearing loss   . Seizure   . Arthritis   . Asthma   . Foot drop   . Anemia   . Dehydration   . Hypotension   . GERD (gastroesophageal reflux disease)   . Hypothyroidism   . Constipation   . Polysubstance abuse   . Cancer   . Leukocytosis     Past Surgical History  Procedure Laterality Date  . Laparotomy N/A 01/30/2014    Procedure: EXPLORATORY LAPAROTOMY;  Surgeon: Gwenyth Ober, MD;  Location: [place];  Service: General;  Laterality: N/A;  . Bowel resection N/A 02/04/2014    Procedure: SMALL BOWEL RESECTION;  Surgeon: Gwenyth Ober, MD;  Location: [place];  Service: General;  Laterality: N/A;  . Application of wound vac N/A 02/10/2014    Procedure: APPLICATION OF WOUND VAC;  Surgeon: Gwenyth Ober, MD;  Location: [place];  Service: General;  Laterality: N/A;    History reviewed. No pertinent family history. Social History:  reports that he has been smoking Cigarettes.  He has a 40.5 pack-year smoking history. He has never used smokeless tobacco. He reports that he does not drink alcohol or use illicit drugs.  Allergies:  Allergies  Allergen Reactions  . Fexofenadine Hcl Other (See Comments)    unknown  . Percocet [Oxycodone-Acetaminophen]  Other (See Comments)    unknown  . Thorazine [Chlorpromazine Hcl] Other (See Comments)    unknown    Medications Prior to Admission  Medication Sig Dispense Refill  . albuterol (PROVENTIL HFA;VENTOLIN HFA) 108 (90 BASE) MCG/ACT inhaler Inhale 2 puffs into the lungs every 4 (four) hours as needed for wheezing or shortness of breath.      . ALPRAZolam (XANAX) 0.5 MG tablet Take 1 tablet (0.5 mg total) by mouth daily at 12 noon.  30 tablet  0  . benztropine (COGENTIN) 0.5 MG tablet Take 0.5-1 mg by mouth See admin instructions. Take 2 tablets in the morning and 1 tablet at bedtime      . doxepin (SINEQUAN) 50 MG capsule Take 50 mg by mouth at bedtime.      . ferrous sulfate 325 (65 FE) MG tablet Take 325 mg by mouth daily with breakfast.      . FLUoxetine (PROZAC) 10 MG capsule Take 10 mg by mouth daily.      Marland Kitchen FLUoxetine (PROZAC) 20 MG capsule Take 20 mg by mouth daily. With 10 mg to = 30 mg      . Fluticasone-Salmeterol (ADVAIR) 100-50 MCG/DOSE AEPB Inhale 1 puff into the lungs 2 (two) times daily.      Marland Kitchen guaifenesin (ROBITUSSIN) 100 MG/5ML syrup Take 200 mg by mouth every 4 (four) hours as needed for cough.      . levothyroxine (SYNTHROID, LEVOTHROID) 75 MCG tablet Take 75 mcg by mouth  daily.      . loratadine (CLARITIN) 10 MG tablet Take 10 mg by mouth daily.      . megestrol (MEGACE) 400 MG/10ML suspension Take 400 mg by mouth 2 (two) times daily.      . metoprolol tartrate (LOPRESSOR) 25 MG tablet Take 25 mg by mouth 2 (two) times daily.      . mirtazapine (REMERON) 45 MG tablet Take 22.5 mg by mouth at bedtime.      . Nutritional Supplements (ENSURE PO) Take 1 Can by mouth 3 (three) times daily with meals.      Marland Kitchen OLANZapine (ZYPREXA) 15 MG tablet Take 15 mg by mouth at bedtime.      Marland Kitchen omeprazole (PRILOSEC) 40 MG capsule Take 40 mg by mouth daily.       . phenytoin (DILANTIN) 100 MG ER capsule Take 300 mg by mouth at bedtime.      . polyethylene glycol powder (GLYCOLAX/MIRALAX) powder  Take 17 g by mouth daily.      . simvastatin (ZOCOR) 10 MG tablet Take 10 mg by mouth at bedtime.      . solifenacin (VESICARE) 10 MG tablet Take 10 mg by mouth daily.      . sucralfate (CARAFATE) 1 G tablet Take 1 g by mouth 4 (four) times daily.      . traMADol (ULTRAM) 50 MG tablet Take 50 mg by mouth every 6 (six) hours as needed for moderate pain.      Marland Kitchen zolpidem (AMBIEN) 5 MG tablet Take 1 tablet (5 mg total) by mouth at bedtime.  30 tablet  0  . acetaminophen (TYLENOL) 325 MG tablet Take 650 mg by mouth every 6 (six) hours as needed. Pain      . ondansetron (ZOFRAN ODT) 4 MG disintegrating tablet Take 1 tablet (4 mg total) by mouth every 8 (eight) hours as needed for nausea.  20 tablet  0    Results for orders placed during the hospital encounter of 12/30/2013 (from the past 48 hour(s))  CBC     Status: Abnormal   Collection Time    01/14/2014  2:11 PM      Result Value Ref Range   WBC 20.4 (*) 4.0 - 10.5 K/uL   RBC 4.06 (*) 4.22 - 5.81 MIL/uL   Hemoglobin 11.9 (*) 13.0 - 17.0 g/dL   Comment: POST TRANSFUSION SPECIMEN   HCT 33.7 (*) 39.0 - 52.0 %   MCV 83.0  78.0 - 100.0 fL   MCH 29.3  26.0 - 34.0 pg   MCHC 35.3  30.0 - 36.0 g/dL   RDW 14.3  11.5 - 15.5 %   Platelets 31 (*) 150 - 400 K/uL   Comment: REPEATED TO VERIFY     CONSISTENT WITH PREVIOUS RESULT  POCT I-STAT 3, BLOOD GAS (G3+)     Status: Abnormal   Collection Time    02/07/2014  2:14 PM      Result Value Ref Range   pH, Arterial 7.278 (*) 7.350 - 7.450   pCO2 arterial 55.8 (*) 35.0 - 45.0 mmHg   pO2, Arterial 63.0 (*) 80.0 - 100.0 mmHg   Bicarbonate 26.1 (*) 20.0 - 24.0 mEq/L   TCO2 28  0 - 100 mmol/L   O2 Saturation 88.0     Acid-base deficit 1.0  0.0 - 2.0 mmol/L   Patient temperature 98.6 F     Collection site ARTERIAL LINE     Drawn by Mining engineer     Sample  type ARTERIAL    LACTIC ACID, PLASMA     Status: Abnormal   Collection Time    02/04/2014  2:30 PM      Result Value Ref Range   Lactic Acid, Venous 3.0 (*)  0.5 - 2.2 mmol/L  PREPARE RBC (CROSSMATCH)     Status: None   Collection Time    02/07/2014  3:00 PM      Result Value Ref Range   Order Confirmation ORDER PROCESSED BY BLOOD BANK    POCT I-STAT, CHEM 8     Status: Abnormal   Collection Time    02/06/2014  3:06 PM      Result Value Ref Range   Sodium 135 (*) 137 - 147 mEq/L   Potassium 3.8  3.7 - 5.3 mEq/L   Chloride 104  96 - 112 mEq/L   BUN <3 (*) 6 - 23 mg/dL   Creatinine, Ser 0.70  0.50 - 1.35 mg/dL   Glucose, Bld 91  70 - 99 mg/dL   Calcium, Ion 0.93 (*) 1.12 - 1.23 mmol/L   TCO2 24  0 - 100 mmol/L   Hemoglobin 11.6 (*) 13.0 - 17.0 g/dL   HCT 34.0 (*) 39.0 - 52.0 %  GLUCOSE, CAPILLARY     Status: None   Collection Time    01/20/2014  3:36 PM      Result Value Ref Range   Glucose-Capillary 87  70 - 99 mg/dL  RENAL FUNCTION PANEL     Status: Abnormal   Collection Time    01/12/2014  4:00 PM      Result Value Ref Range   Sodium 138  137 - 147 mEq/L   Potassium 3.5 (*) 3.7 - 5.3 mEq/L   Chloride 101  96 - 112 mEq/L   CO2 20  19 - 32 mEq/L   Glucose, Bld 94  70 - 99 mg/dL   BUN 6  6 - 23 mg/dL   Creatinine, Ser 0.69  0.50 - 1.35 mg/dL   Calcium 7.2 (*) 8.4 - 10.5 mg/dL   Phosphorus 1.6 (*) 2.3 - 4.6 mg/dL   Albumin 2.1 (*) 3.5 - 5.2 g/dL   GFR calc non Af Amer >90  >90 mL/min   GFR calc Af Amer >90  >90 mL/min   Comment: (NOTE)     The eGFR has been calculated using the CKD EPI equation.     This calculation has not been validated in all clinical situations.     eGFR's persistently <90 mL/min signify possible Chronic Kidney     Disease.  POCT I-STAT 3, BLOOD GAS (G3+)     Status: Abnormal   Collection Time    02/09/2014  4:27 PM      Result Value Ref Range   pH, Arterial 7.479 (*) 7.350 - 7.450   pCO2 arterial 34.0 (*) 35.0 - 45.0 mmHg   pO2, Arterial 346.0 (*) 80.0 - 100.0 mmHg   Bicarbonate 25.3 (*) 20.0 - 24.0 mEq/L   TCO2 26  0 - 100 mmol/L   O2 Saturation 100.0     Acid-Base Excess 2.0  0.0 - 2.0 mmol/L   Patient  temperature 98.6 F     Collection site ARTERIAL LINE     Drawn by Operator     Sample type ARTERIAL    CBC     Status: Abnormal   Collection Time    01/30/2014  4:33 PM      Result Value Ref Range   WBC  19.4 (*) 4.0 - 10.5 K/uL   RBC 4.13 (*) 4.22 - 5.81 MIL/uL   Hemoglobin 12.2 (*) 13.0 - 17.0 g/dL   HCT 34.0 (*) 39.0 - 52.0 %   MCV 82.3  78.0 - 100.0 fL   MCH 29.5  26.0 - 34.0 pg   MCHC 35.9  30.0 - 36.0 g/dL   RDW 14.3  11.5 - 15.5 %   Platelets 30 (*) 150 - 400 K/uL   Comment: REPEATED TO VERIFY     CONSISTENT WITH PREVIOUS RESULT  GLUCOSE, CAPILLARY     Status: None   Collection Time    02/02/2014  7:18 PM      Result Value Ref Range   Glucose-Capillary 86  70 - 99 mg/dL  CBC     Status: Abnormal   Collection Time    02/09/2014 10:30 PM      Result Value Ref Range   WBC 19.9 (*) 4.0 - 10.5 K/uL   RBC 3.71 (*) 4.22 - 5.81 MIL/uL   Hemoglobin 10.9 (*) 13.0 - 17.0 g/dL   HCT 30.2 (*) 39.0 - 52.0 %   MCV 81.4  78.0 - 100.0 fL   MCH 29.4  26.0 - 34.0 pg   MCHC 36.1 (*) 30.0 - 36.0 g/dL   RDW 14.3  11.5 - 15.5 %   Platelets 92 (*) 150 - 400 K/uL   Comment: REPEATED TO VERIFY     CONSISTENT WITH PREVIOUS RESULT  GLUCOSE, CAPILLARY     Status: None   Collection Time    01/20/14 12:19 AM      Result Value Ref Range   Glucose-Capillary 83  70 - 99 mg/dL  CBC     Status: Abnormal   Collection Time    01/20/14  2:30 AM      Result Value Ref Range   WBC 23.5 (*) 4.0 - 10.5 K/uL   RBC 3.71 (*) 4.22 - 5.81 MIL/uL   Hemoglobin 11.1 (*) 13.0 - 17.0 g/dL   HCT 30.1 (*) 39.0 - 52.0 %   MCV 81.1  78.0 - 100.0 fL   MCH 29.9  26.0 - 34.0 pg   MCHC 36.9 (*) 30.0 - 36.0 g/dL   RDW 14.5  11.5 - 15.5 %   Platelets 86 (*) 150 - 400 K/uL   Comment: CONSISTENT WITH PREVIOUS RESULT  GLUCOSE, CAPILLARY     Status: None   Collection Time    01/20/14  4:16 AM      Result Value Ref Range   Glucose-Capillary 82  70 - 99 mg/dL  MAGNESIUM     Status: None   Collection Time    01/20/14   4:33 AM      Result Value Ref Range   Magnesium 1.9  1.5 - 2.5 mg/dL  PHOSPHORUS     Status: Abnormal   Collection Time    01/20/14  4:33 AM      Result Value Ref Range   Phosphorus 0.8 (*) 2.3 - 4.6 mg/dL   Comment: CRITICAL RESULT CALLED TO, READ BACK BY AND VERIFIED WITH:     Ledell Noss (RN) 0701 01/20/2014 L. LOMAX  COMPREHENSIVE METABOLIC PANEL     Status: Abnormal   Collection Time    01/20/14  4:33 AM      Result Value Ref Range   Sodium 138  137 - 147 mEq/L   Potassium 3.6 (*) 3.7 - 5.3 mEq/L   Chloride 102  96 - 112 mEq/L  CO2 24  19 - 32 mEq/L   Glucose, Bld 86  70 - 99 mg/dL   BUN 4 (*) 6 - 23 mg/dL   Creatinine, Ser 0.50  0.50 - 1.35 mg/dL   Calcium 7.3 (*) 8.4 - 10.5 mg/dL   Total Protein 4.4 (*) 6.0 - 8.3 g/dL   Albumin 1.9 (*) 3.5 - 5.2 g/dL   AST 124 (*) 0 - 37 U/L   ALT 155 (*) 0 - 53 U/L   Alkaline Phosphatase 164 (*) 39 - 117 U/L   Total Bilirubin 7.1 (*) 0.3 - 1.2 mg/dL   GFR calc non Af Amer >90  >90 mL/min   GFR calc Af Amer >90  >90 mL/min   Comment: (NOTE)     The eGFR has been calculated using the CKD EPI equation.     This calculation has not been validated in all clinical situations.     eGFR's persistently <90 mL/min signify possible Chronic Kidney     Disease.  LACTIC ACID, PLASMA     Status: Abnormal   Collection Time    01/20/14  5:00 AM      Result Value Ref Range   Lactic Acid, Venous 2.7 (*) 0.5 - 2.2 mmol/L  CBC     Status: Abnormal   Collection Time    01/20/14  6:30 AM      Result Value Ref Range   WBC 19.6 (*) 4.0 - 10.5 K/uL   RBC 4.00 (*) 4.22 - 5.81 MIL/uL   Hemoglobin 11.9 (*) 13.0 - 17.0 g/dL   HCT 32.2 (*) 39.0 - 52.0 %   MCV 80.5  78.0 - 100.0 fL   MCH 29.8  26.0 - 34.0 pg   MCHC 37.0 (*) 30.0 - 36.0 g/dL   RDW 14.9  11.5 - 15.5 %   Platelets 85 (*) 150 - 400 K/uL   Comment: CONSISTENT WITH PREVIOUS RESULT  GLUCOSE, CAPILLARY     Status: None   Collection Time    01/20/14  8:35 AM      Result Value Ref Range    Glucose-Capillary 85  70 - 99 mg/dL  POCT I-STAT 3, BLOOD GAS (G3+)     Status: Abnormal   Collection Time    01/20/14  9:02 AM      Result Value Ref Range   pH, Arterial 7.530 (*) 7.350 - 7.450   pCO2 arterial 28.1 (*) 35.0 - 45.0 mmHg   pO2, Arterial 46.0 (*) 80.0 - 100.0 mmHg   Bicarbonate 24.0  20.0 - 24.0 mEq/L   TCO2 25  0 - 100 mmol/L   O2 Saturation 91.0     Acid-Base Excess 1.0  0.0 - 2.0 mmol/L   Patient temperature 94.0 F     Collection site ARTERIAL LINE     Drawn by RT     Sample type ARTERIAL    VANCOMYCIN, RANDOM     Status: None   Collection Time    01/20/14 10:00 AM      Result Value Ref Range   Vancomycin Rm 12.9     Comment:            Random Vancomycin therapeutic     range is dependent on dosage and     time of specimen collection.     A peak range is 20.0-40.0 ug/mL     A trough range is 5.0-15.0 ug/mL             GLUCOSE, CAPILLARY  Status: Abnormal   Collection Time    01/20/14 11:50 AM      Result Value Ref Range   Glucose-Capillary 107 (*) 70 - 99 mg/dL  CBC WITH DIFFERENTIAL     Status: Abnormal   Collection Time    01/20/14 12:23 PM      Result Value Ref Range   WBC 19.5 (*) 4.0 - 10.5 K/uL   RBC 3.83 (*) 4.22 - 5.81 MIL/uL   Hemoglobin 11.3 (*) 13.0 - 17.0 g/dL   HCT 30.9 (*) 39.0 - 52.0 %   MCV 80.7  78.0 - 100.0 fL   MCH 29.5  26.0 - 34.0 pg   MCHC 36.6 (*) 30.0 - 36.0 g/dL   RDW 14.8  11.5 - 15.5 %   Platelets 60 (*) 150 - 400 K/uL   Comment: CONSISTENT WITH PREVIOUS RESULT   Neutrophils Relative % 81 (*) 43 - 77 %   Neutro Abs 15.8 (*) 1.7 - 7.7 K/uL   Lymphocytes Relative 9 (*) 12 - 46 %   Lymphs Abs 1.8  0.7 - 4.0 K/uL   Monocytes Relative 7  3 - 12 %   Monocytes Absolute 1.4 (*) 0.1 - 1.0 K/uL   Eosinophils Relative 3  0 - 5 %   Eosinophils Absolute 0.5  0.0 - 0.7 K/uL   Basophils Relative 1  0 - 1 %   Basophils Absolute 0.2 (*) 0.0 - 0.1 K/uL   WBC Morphology INCREASED BANDS (>20% BANDS)     Comment: MILD LEFT SHIFT  (1-5% METAS, OCC MYELO, OCC BANDS)     VACUOLATED NEUTROPHILS   RBC Morphology POLYCHROMASIA PRESENT    RENAL FUNCTION PANEL     Status: Abnormal   Collection Time    01/20/14  3:00 PM      Result Value Ref Range   Sodium 137  137 - 147 mEq/L   Potassium 3.7  3.7 - 5.3 mEq/L   Chloride 103  96 - 112 mEq/L   CO2 22  19 - 32 mEq/L   Glucose, Bld 105 (*) 70 - 99 mg/dL   BUN 3 (*) 6 - 23 mg/dL   Creatinine, Ser 0.47 (*) 0.50 - 1.35 mg/dL   Calcium 6.9 (*) 8.4 - 10.5 mg/dL   Phosphorus 1.9 (*) 2.3 - 4.6 mg/dL   Albumin 2.1 (*) 3.5 - 5.2 g/dL   GFR calc non Af Amer >90  >90 mL/min   GFR calc Af Amer >90  >90 mL/min   Comment: (NOTE)     The eGFR has been calculated using the CKD EPI equation.     This calculation has not been validated in all clinical situations.     eGFR's persistently <90 mL/min signify possible Chronic Kidney     Disease.  GLUCOSE, CAPILLARY     Status: Abnormal   Collection Time    01/20/14  3:42 PM      Result Value Ref Range   Glucose-Capillary 107 (*) 70 - 99 mg/dL  CBC     Status: Abnormal   Collection Time    01/20/14  5:00 PM      Result Value Ref Range   WBC 19.3 (*) 4.0 - 10.5 K/uL   RBC 3.81 (*) 4.22 - 5.81 MIL/uL   Hemoglobin 11.3 (*) 13.0 - 17.0 g/dL   HCT 30.7 (*) 39.0 - 52.0 %   MCV 80.6  78.0 - 100.0 fL   MCH 29.7  26.0 - 34.0 pg   MCHC  36.8 (*) 30.0 - 36.0 g/dL   RDW 14.8  11.5 - 15.5 %   Platelets 47 (*) 150 - 400 K/uL   Comment: CONSISTENT WITH PREVIOUS RESULT  PROTIME-INR     Status: Abnormal   Collection Time    01/20/14  5:00 PM      Result Value Ref Range   Prothrombin Time 18.9 (*) 11.6 - 15.2 seconds   INR 1.63 (*) 0.00 - 1.49  APTT     Status: Abnormal   Collection Time    01/20/14  5:00 PM      Result Value Ref Range   aPTT 40 (*) 24 - 37 seconds   Comment:            IF BASELINE aPTT IS ELEVATED,     SUGGEST PATIENT RISK ASSESSMENT     BE USED TO DETERMINE APPROPRIATE     ANTICOAGULANT THERAPY.  PHOSPHORUS     Status:  Abnormal   Collection Time    01/20/14  5:00 PM      Result Value Ref Range   Phosphorus 1.1 (*) 2.3 - 4.6 mg/dL  MAGNESIUM     Status: None   Collection Time    01/20/14  5:00 PM      Result Value Ref Range   Magnesium 2.0  1.5 - 2.5 mg/dL  GLUCOSE, CAPILLARY     Status: Abnormal   Collection Time    01/20/14  7:14 PM      Result Value Ref Range   Glucose-Capillary 109 (*) 70 - 99 mg/dL  GLUCOSE, CAPILLARY     Status: Abnormal   Collection Time    02/04/2014 12:21 AM      Result Value Ref Range   Glucose-Capillary 205 (*) 70 - 99 mg/dL   Comment 1 Documented in Chart     Comment 2 Notify RN    GLUCOSE, CAPILLARY     Status: Abnormal   Collection Time    02/06/2014  3:55 AM      Result Value Ref Range   Glucose-Capillary 219 (*) 70 - 99 mg/dL  MAGNESIUM     Status: None   Collection Time    02/06/2014  5:00 AM      Result Value Ref Range   Magnesium 2.1  1.5 - 2.5 mg/dL  PHOSPHORUS     Status: Abnormal   Collection Time    01/22/2014  5:00 AM      Result Value Ref Range   Phosphorus 1.9 (*) 2.3 - 4.6 mg/dL  COMPREHENSIVE METABOLIC PANEL     Status: Abnormal   Collection Time    01/31/2014  5:00 AM      Result Value Ref Range   Sodium 135 (*) 137 - 147 mEq/L   Potassium 4.3  3.7 - 5.3 mEq/L   Chloride 100  96 - 112 mEq/L   CO2 22  19 - 32 mEq/L   Glucose, Bld 179 (*) 70 - 99 mg/dL   BUN 4 (*) 6 - 23 mg/dL   Creatinine, Ser 0.45 (*) 0.50 - 1.35 mg/dL   Calcium 7.5 (*) 8.4 - 10.5 mg/dL   Total Protein 4.5 (*) 6.0 - 8.3 g/dL   Albumin 1.9 (*) 3.5 - 5.2 g/dL   AST 90 (*) 0 - 37 U/L   ALT 136 (*) 0 - 53 U/L   Alkaline Phosphatase 151 (*) 39 - 117 U/L   Total Bilirubin 5.8 (*) 0.3 - 1.2 mg/dL   GFR calc  non Af Amer >90  >90 mL/min   GFR calc Af Amer >90  >90 mL/min   Comment: (NOTE)     The eGFR has been calculated using the CKD EPI equation.     This calculation has not been validated in all clinical situations.     eGFR's persistently <90 mL/min signify possible Chronic  Kidney     Disease.  APTT     Status: Abnormal   Collection Time    02/09/2014  5:00 AM      Result Value Ref Range   aPTT 38 (*) 24 - 37 seconds   Comment:            IF BASELINE aPTT IS ELEVATED,     SUGGEST PATIENT RISK ASSESSMENT     BE USED TO DETERMINE APPROPRIATE     ANTICOAGULANT THERAPY.  PROTIME-INR     Status: Abnormal   Collection Time    02/09/2014  5:00 AM      Result Value Ref Range   Prothrombin Time 17.0 (*) 11.6 - 15.2 seconds   INR 1.42  0.00 - 1.49  CBC     Status: Abnormal   Collection Time    02/08/2014  5:00 AM      Result Value Ref Range   WBC 16.9 (*) 4.0 - 10.5 K/uL   Comment: WHITE COUNT CONFIRMED ON SMEAR   RBC 3.83 (*) 4.22 - 5.81 MIL/uL   Hemoglobin 11.8 (*) 13.0 - 17.0 g/dL   HCT 31.2 (*) 39.0 - 52.0 %   MCV 81.5  78.0 - 100.0 fL   MCH 30.8  26.0 - 34.0 pg   MCHC 37.8 (*) 30.0 - 36.0 g/dL   Comment: HEMOLYSIS AT THIS LEVEL MAY AFFECT RESULT     LIPEMIC SPECIMEN   RDW 15.4  11.5 - 15.5 %   Platelets 44 (*) 150 - 400 K/uL   Comment: PLATELET COUNT CONFIRMED BY SMEAR  GLUCOSE, CAPILLARY     Status: Abnormal   Collection Time    01/24/2014  8:26 AM      Result Value Ref Range   Glucose-Capillary 134 (*) 70 - 99 mg/dL  PREPARE PLATELET PHERESIS     Status: None   Collection Time    01/16/2014  9:30 AM      Result Value Ref Range   Unit Number [id number]     Blood Component Type PLTPHER LR3     Unit division 00     Status of Unit REL FROM [facility]     Transfusion Status OK TO TRANSFUSE     Unit Number [id number]     Blood Component Type PLTPHER LR3     Unit division 00     Status of Unit ALLOCATED     Transfusion Status OK TO TRANSFUSE    POCT I-STAT 3, BLOOD GAS (G3+)     Status: Abnormal   Collection Time    01/12/2014 11:14 AM      Result Value Ref Range   pH, Arterial 7.422  7.350 - 7.450   pCO2 arterial 34.1 (*) 35.0 - 45.0 mmHg   pO2, Arterial 52.0 (*) 80.0 - 100.0 mmHg   Bicarbonate 22.4  20.0 - 24.0 mEq/L   TCO2 23  0 - 100 mmol/L    O2 Saturation 88.0     Acid-base deficit 2.0  0.0 - 2.0 mmol/L   Patient temperature 97.3 F     Collection site ARTERIAL LINE     Drawn by  Operator     Sample type ARTERIAL    GLUCOSE, CAPILLARY     Status: Abnormal   Collection Time    01/20/2014 11:55 AM      Result Value Ref Range   Glucose-Capillary 160 (*) 70 - 99 mg/dL   Dg Chest Port 1 View  02/09/2014   CLINICAL DATA:  Endotracheal tube.  Pleural effusion.  EXAM: PORTABLE CHEST - 1 VIEW  COMPARISON:  01/20/2014  FINDINGS: Endotracheal tube has its tip 4 cm above the carina. Nasogastric tube enters the stomach. Right internal jugular central line has its tip at the azygos level of the SVC. Left internal jugular central line has its tip in the SVC 4 cm above the right atrium. Pneumonia throughout both lower lungs process, right more than left. Small amount of pleural fluid again seen on the right. Findings are quite similar compared to the previous study. No new finding.  IMPRESSION: Lines and tubes well positioned. Persistent bilateral pulmonary infiltrates right worse than left.   Electronically Signed   By: Nelson Chimes M.D.   On: 02/04/2014 07:11   Dg Chest Port 1 View  01/20/2014   CLINICAL DATA:  Dyspnea  EXAM: PORTABLE CHEST - 1 VIEW  COMPARISON:  DG CHEST 1V PORT dated 02/03/2014  FINDINGS: A left internal jugular catheter is appreciated tip projecting regions superior vena cava. A right internal jugular sheath is demonstrated unchanged. Endotracheal tube tip 5 cm above the carina. NG tube tip not viewed on this study. Cardiac silhouette within normal limits. There is slight increase in the bilateral pulmonary opacities. Blunting of the right costophrenic angle is appreciated. No acute osseous abnormalities.  IMPRESSION: Slightly increased conspicuity of the bilateral infiltrates with greatest confluence of the lung bases right greater than left. Small right effusion.  Support apparatus  adequately position.   Electronically Signed   By:  Margaree Mackintosh M.D.   On: 01/20/2014 07:25   Dg Abd Portable 1v  01/20/2014   CLINICAL DATA:  Recent small bowel resection  EXAM: PORTABLE ABDOMEN - 1 VIEW  COMPARISON:  CT ABD/PELV WO CM dated 01/17/2014  FINDINGS: There is appreciated within nondilated loops of large small bowel. An NG tube appreciated tip projecting in the expected region of stomach. A catheter projects in the central portion of the abdomen may represent a post surgical drainage catheter. Osseous structures unremarkable.  IMPRESSION: Nonobstructive bowel gas pattern.   Electronically Signed   By: Margaree Mackintosh M.D.   On: 01/20/2014 07:27    Review of Systems  Constitutional: Positive for fever.  Neurological: Positive for seizures.    Blood pressure 92/53, pulse 108, temperature 99.3 F (37.4 C), temperature source Rectal, resp. rate 21, height _0  (1.88 m), weight 63.2 kg (139 lb 5.3 oz), SpO2 96.00%. Physical Exam  Constitutional:  On vent; no response; going to OR now for further eval of ischemic bowel--possible more removal Also for trach  Unable to examine pt---literally transporting to OR     Assessment/Plan Severe sepsis- unstable Hypoxia; hypotension On vent Thrombocytopenia Developed ischemic bowel requiring bowel resection Continues to bleed; wound vac change every 30 mins Back to OR now New LLE DVT--cannot anticoagulate Scheduled for IVC filter 4/11 Pt has no family All consents have been signed emergently by physician We will check with RN in am about pt status   Lavonia Drafts 01/14/2014, 2:10 PM

## 2014-01-21 NOTE — Progress Notes (Signed)
Subjective: Interval History: sedated on vent.  Objective: Vital signs in last 24 hours: Temp:  [90.9 F (32.7 C)-94 F (34.4 C)] 94 F (34.4 C) (04/10 0400) Pulse Rate:  [79-99] 81 (04/10 0645) Resp:  [17-32] 28 (04/10 0645) BP: (81-125)/(43-92) 94/60 mmHg (04/10 0600) SpO2:  [84 %-100 %] 97 % (04/10 0645) Arterial Line BP: (63-149)/(29-73) 128/68 mmHg (04/10 0645) FiO2 (%):  [30 %-100 %] 30 % (04/10 0600) Weight:  [63.2 kg (139 lb 5.3 oz)] 63.2 kg (139 lb 5.3 oz) (04/10 0400) Weight change: -0.7 kg (-1 lb 8.7 oz)  Intake/Output from previous day: 04/09 0701 - 04/10 0700 In: 5608.2 [I.V.:3446.2; IV Piggyback:1662; TPN:500] Out: 6201 [Emesis/NG output:250; Drains:5500] Intake/Output this shift:    General appearance: pale and moves arms,not purposeful, stare Resp: rales bibasilar Cardio: S1, S2 normal and systolic murmur: holosystolic 2/6, blowing at apex GI: no bs, firm, vac mid abdm Extremities: edema 3+  Lab Results:  Recent Labs  01/20/14 1223 01/20/14 1700  WBC 19.5* 19.3*  HGB 11.3* 11.3*  HCT 30.9* 30.7*  PLT 60* 47*   BMET:  Recent Labs  01/20/14 1500 01/16/2014 0500  NA 137 135*  K 3.7 4.3  CL 103 100  CO2 22 22  GLUCOSE 105* 179*  BUN 3* 4*  CREATININE 0.47* 0.45*  CALCIUM 6.9* 7.5*   No results found for this basename: PTH,  in the last 72 hours Iron Studies:  Recent Labs  01/22/2014 0807  IRON 117  TIBC 150*    Studies/Results: Dg Chest Port 1 View  01/20/2014   CLINICAL DATA:  Dyspnea  EXAM: PORTABLE CHEST - 1 VIEW  COMPARISON:  DG CHEST 1V PORT dated 01/31/2014  FINDINGS: A left internal jugular catheter is appreciated tip projecting regions superior vena cava. A right internal jugular sheath is demonstrated unchanged. Endotracheal tube tip 5 cm above the carina. NG tube tip not viewed on this study. Cardiac silhouette within normal limits. There is slight increase in the bilateral pulmonary opacities. Blunting of the right costophrenic angle  is appreciated. No acute osseous abnormalities.  IMPRESSION: Slightly increased conspicuity of the bilateral infiltrates with greatest confluence of the lung bases right greater than left. Small right effusion.  Support apparatus  adequately position.   Electronically Signed   By: Margaree Mackintosh M.D.   On: 01/20/2014 07:25   Dg Abd Portable 1v  01/20/2014   CLINICAL DATA:  Recent small bowel resection  EXAM: PORTABLE ABDOMEN - 1 VIEW  COMPARISON:  CT ABD/PELV WO CM dated 01/17/2014  FINDINGS: There is appreciated within nondilated loops of large small bowel. An NG tube appreciated tip projecting in the expected region of stomach. A catheter projects in the central portion of the abdomen may represent a post surgical drainage catheter. Osseous structures unremarkable.  IMPRESSION: Nonobstructive bowel gas pattern.   Electronically Signed   By: Margaree Mackintosh M.D.   On: 01/20/2014 07:27    I have reviewed the patient's current medications.  Assessment/Plan: 1 AKI ATN from schock.  Vol xs but high dose pressors.  Mild acidemia.K and solute ok. Less pressors, but hold off neg.  CXR with interstitial edema, but PO2 ok 2 VDRF per CCM stable 3 Ischemic bowel 4 Infx on Merum and Vanc 5 Nutrition none 6 Schizo affective P CRRT, keep even, replace phos, follow P, O2, bp,    LOS: 13 days   [name] [name] 01/12/2014,7:13 AM

## 2014-01-21 NOTE — Transfer of Care (Signed)
Immediate Anesthesia Transfer of Care Note  Patient: [name] [name]  Procedure(s) Performed: Procedure(s): TRACHEOSTOMY (N/A) Partial Small Bowel Resection and Repair and Re-anastomosis (N/A) GASTROSTOMY (N/A) APPLICATION OF WOUND VAC  Patient Location: SICU  Anesthesia Type:General  Level of Consciousness: sedated  Airway & Oxygen Therapy: Patient placed on Ventilator (see vital sign flow sheet for setting) and connected to tracheostomy  Post-op Assessment: Report given to PACU RN and Post -op Vital signs reviewed and stable  Post vital signs: Reviewed and stable  Complications: No apparent anesthesia complications

## 2014-01-21 NOTE — Progress Notes (Signed)
Pt removed from CVVHD when filter clotted at 0900. Each port of the HD cath irrigated with 1.2 cc's of heparin ( 1000 units/ml) to  dwell per protocol to maintain HD cath patency.At 1715 after returning from OR CVVHD was resumed. Each port of the HD cath was aspirated for total of 5 cc each  of blood to ensure no heparin was infused in process of resuming therapy.Pt. Tolerated procedure well.

## 2014-01-21 NOTE — Anesthesia Postprocedure Evaluation (Signed)
  Anesthesia Post-op Note  Patient: [name] [name]  Procedure(s) Performed: Procedure(s): TRACHEOSTOMY (N/A) Partial Small Bowel Resection and Repair and Re-anastomosis (N/A) GASTROSTOMY (N/A) APPLICATION OF WOUND VAC (N/A)  Patient Location: ICU  Anesthesia Type:General  Level of Consciousness: Patient remains intubated per anesthesia plan  Airway and Oxygen Therapy: Patient remains intubated per anesthesia plan and Patient placed on Ventilator (see vital sign flow sheet for setting)  Post-op Pain: none  Post-op Assessment: Post-op Vital signs reviewed, Patient's Cardiovascular Status Stable, Respiratory Function Stable and Patent Airway  Post-op Vital Signs: stable  Last Vitals:  Filed Vitals:   2014-02-20 1650  BP:   Pulse:   Temp: 35.6 C  Resp:     Complications: No apparent anesthesia complications

## 2014-01-21 NOTE — Progress Notes (Signed)
PARENTERAL NUTRITION CONSULT NOTE- Follow Up  Pharmacy Consult for TPN Indication: Massive Bowel Resection  Allergies  Allergen Reactions  . Fexofenadine Hcl Other (See Comments)    unknown  . Percocet [Oxycodone-Acetaminophen] Other (See Comments)    unknown  . Thorazine [Chlorpromazine Hcl] Other (See Comments)    unknown   Patient Measurements: Height: 6' 2"  (188 cm) Weight: 139 lb 5.3 oz (63.2 kg) IBW/kg (Calculated) : 82.2  Vital Signs: Temp: 94 F (34.4 C) (04/10 0400) Temp src: Rectal (04/10 0400) BP: 104/78 mmHg (04/10 0700) Pulse Rate: 79 (04/10 0715) Intake/Output from previous day: 04/09 0701 - 04/10 0700 In: 5693.6 [I.V.:3491.6; IV Piggyback:1662; TPN:540] Out: 6202 [Emesis/NG output:250; Drains:5500] Intake/Output from this shift: Total I/O In: -  Out: 2 [Other:2]  Labs:  Recent Labs  01/13/2014 0430  01/20/14 1223 01/20/14 1700 01/12/2014 0500  WBC 32.7*  < > 19.5* 19.3* 16.9*  HGB 8.5*  < > 11.3* 11.3* 11.8*  HCT 23.8*  < > 30.9* 30.7* 31.2*  PLT 26*  29*  < > 60* 47* 44*  APTT 62*  --   --  40* 38*  INR 1.92*  --   --  1.63* 1.42  < > = values in this interval not displayed.   Recent Labs  01/17/2014 0430  01/20/14 0433 01/20/14 1500 01/20/14 1700 02/01/2014 0500  NA 137  < > 138 137  --  135*  K 4.0  < > 3.6* 3.7  --  4.3  CL 103  < > 102 103  --  100  CO2 26  < > 24 22  --  22  GLUCOSE 138*  < > 86 105*  --  179*  BUN 7  < > 4* 3*  --  4*  CREATININE 0.67  < > 0.50 0.47*  --  0.45*  CALCIUM 6.7*  < > 7.3* 6.9*  --  7.5*  MG 2.0  --  1.9  --  2.0 2.1  PHOS 1.3*  < > 0.8* 1.9* 1.1* 1.9*  PROT 4.1*  --  4.4*  --   --  4.5*  ALBUMIN 1.6*  1.5*  < > 1.9* 2.1*  --  1.9*  AST 171*  --  124*  --   --  90*  ALT 252*  --  155*  --   --  136*  ALKPHOS 111  --  164*  --   --  151*  BILITOT 4.5*  --  7.1*  --   --  5.8*  BILIDIR 3.9*  --   --   --   --   --   IBILI 0.6  --   --   --   --   --   < > = values in this interval not  displayed. Estimated Creatinine Clearance: 106.4 ml/min (by C-G formula based on Cr of 0.45).    Recent Labs  01/14/2014 0021 01/29/2014 0355 01/29/2014 0826  GLUCAP 205* 219* 134*   Insulin Requirements in the past 24 hours:  6 units SSI  Current Nutrition:  Clinimix E 5/15 at 29m/hr + 20% lipid emulsion at 1102mhr- provides 36g protein and 991 kcal daily Also on D5 at 2040mr which provides an additional 96 kcal from dextrose  Nutritional Goals:  1674kcal, 80-95g protein daily per RD recommendations 4/7 (before patient was started on TPN)  Assessment: [age]M [facility]29 was intubated shortly after admission. He is s/p  an ischemic small bowel resection on 4/7    GI: Patient has been off tube feeding since 4/5. Feeding supplements were stopped 4/8 and he is now NPO with bowel rest. He has a history of GERD with chronic PPI use. Currently with acute liver failure due to severe sepsis. Going to OR today for VAC change and possible bowel anastomosis. Will also get trach done in surgery  Endo: CBGs 105-219 in last 24 hours. Patient was started on stress steroids yesterday and is now requiring some SSI (had not been previously)  Lytes: Na 135, K 4.3, Phos 1.9 (repleted yesterday with KPhos 48mol. Renal replacing today with 230ml NaPhos), Mg 2.1, CorrCa 9.1. Since patient was on tube feeds till 4/5, likely low risk for re-feeding syndrome.  Renal: SCr 0.45, On CVVHDF with BFR 25068min- trying to keep even  Pulm: 90% on 30% FiO2, PEEP 5. May get trached in OR today  Cards: BP 107/51, HR70-100s, On Levophed    Hepatobil: AST/ALT elevated but trending down- today 90/136, respectively. Alk Phos elevated at 151 (also with slight trend down), TBilli down but still elevated at 5.8  Neuro: hx schizoaffective disorder, seizures and polysubstance abuse. On fentanyl and versed.  ID: Severe sepsis, On Merrem and Vancomycin. May consider dc vanc if no  MRSA or eneterococcus. WBC 16.9, HYPOthermic  Best Practices: SCDs, IV PPI   TPN Access: CVC Triple lumen 3/29 TPN day#: 1 (started 4/9)  Plan:  1. Increase Clinimix E 5/15 to 50 mL/hr + lipid 20% emulsion at 10 mL/hr- this will provide 60g protein and 1332 kcal daily 2. Change MIVF to NS at 5m11m starting now so as to eliminate extra kcal from D5 while also hoping to improve low Na 3. Multivitamin daily and trace elements MWF only due to shortage- with elevated TBili, will only provide HALF trace elements on MWF. 4. Continue SSI and CBGs 6 times daily 5. Due to shortage of IV Protonix, will change SUP to IV Pepcid in TPN. Will add 40mg26mTPN bag but since patient will not receive entire bag at current rate, he will be receiving 24mg 69mepcid daily. 6. TPN labs as ordered- baseline trigs and prealbumin ordered for tomorrow morning  Quyen Cutsforth D. Thena Devora, PharmD, BCPS Clinical Pharmacist Pager: [phone] 9:22 AM

## 2014-01-22 ENCOUNTER — Inpatient Hospital Stay (HOSPITAL_COMMUNITY): Payer: Medicare Other

## 2014-01-22 DIAGNOSIS — A4102 Sepsis due to Methicillin resistant Staphylococcus aureus: Secondary | ICD-10-CM | POA: Diagnosis not present

## 2014-01-22 DIAGNOSIS — N179 Acute kidney failure, unspecified: Secondary | ICD-10-CM

## 2014-01-22 DIAGNOSIS — R0602 Shortness of breath: Secondary | ICD-10-CM | POA: Diagnosis not present

## 2014-01-22 LAB — TYPE AND SCREEN
ABO/RH(D): O NEG
Antibody Screen: NEGATIVE
UNIT DIVISION: 0
UNIT DIVISION: 0
UNIT DIVISION: 0
Unit division: 0
Unit division: 0
Unit division: 0
Unit division: 0
Unit division: 0
Unit division: 0
Unit division: 0
Unit division: 0
Unit division: 0

## 2014-01-22 LAB — GLUCOSE, CAPILLARY
GLUCOSE-CAPILLARY: 170 mg/dL — AB (ref 70–99)
GLUCOSE-CAPILLARY: 145 mg/dL — AB (ref 70–99)
Glucose-Capillary: 139 mg/dL — ABNORMAL HIGH (ref 70–99)
Glucose-Capillary: 147 mg/dL — ABNORMAL HIGH (ref 70–99)
Glucose-Capillary: 151 mg/dL — ABNORMAL HIGH (ref 70–99)
Glucose-Capillary: 172 mg/dL — ABNORMAL HIGH (ref 70–99)

## 2014-01-22 LAB — PHOSPHORUS: Phosphorus: 2.4 mg/dL (ref 2.3–4.6)

## 2014-01-22 LAB — CBC
HCT: 28.7 % — ABNORMAL LOW (ref 39.0–52.0)
HCT: 25.1 % — ABNORMAL LOW (ref 39.0–52.0)
Hemoglobin: 10 g/dL — ABNORMAL LOW (ref 13.0–17.0)
Hemoglobin: 9 g/dL — ABNORMAL LOW (ref 13.0–17.0)
MCH: 29.7 pg (ref 26.0–34.0)
MCH: 31.1 pg (ref 26.0–34.0)
MCHC: 34.8 g/dL (ref 30.0–36.0)
MCHC: 35.9 g/dL (ref 30.0–36.0)
MCV: 85.2 fL (ref 78.0–100.0)
MCV: 86.9 fL (ref 78.0–100.0)
PLATELETS: 34 10*3/uL — AB (ref 150–400)
Platelets: 44 10*3/uL — ABNORMAL LOW (ref 150–400)
RBC: 3.37 MIL/uL — ABNORMAL LOW (ref 4.22–5.81)
RBC: 2.89 MIL/uL — ABNORMAL LOW (ref 4.22–5.81)
RDW: 16.2 % — ABNORMAL HIGH (ref 11.5–15.5)
RDW: 17 % — ABNORMAL HIGH (ref 11.5–15.5)
WBC: 19.6 10*3/uL — AB (ref 4.0–10.5)
WBC: 17.9 10*3/uL — AB (ref 4.0–10.5)

## 2014-01-22 LAB — DIFFERENTIAL
BASOS ABS: 0.2 10*3/uL — AB (ref 0.0–0.1)
BASOS PCT: 1 % (ref 0–1)
EOS ABS: 0 10*3/uL (ref 0.0–0.7)
Eosinophils Relative: 0 % (ref 0–5)
LYMPHS PCT: 8 % — AB (ref 12–46)
Lymphs Abs: 1.6 10*3/uL (ref 0.7–4.0)
Monocytes Absolute: 1.8 10*3/uL — ABNORMAL HIGH (ref 0.1–1.0)
Monocytes Relative: 9 % (ref 3–12)
NEUTROS PCT: 82 % — AB (ref 43–77)
Neutro Abs: 16 10*3/uL — ABNORMAL HIGH (ref 1.7–7.7)

## 2014-01-22 LAB — POCT I-STAT 3, ART BLOOD GAS (G3+)
ACID-BASE DEFICIT: 3 mmol/L — AB (ref 0.0–2.0)
Acid-base deficit: 2 mmol/L (ref 0.0–2.0)
Bicarbonate: 24.9 mEq/L — ABNORMAL HIGH (ref 20.0–24.0)
Bicarbonate: 23.5 mEq/L (ref 20.0–24.0)
O2 Saturation: 96 %
O2 Saturation: 92 %
PH ART: 7.329 — AB (ref 7.350–7.450)
Patient temperature: 93
TCO2: 26 mmol/L (ref 0–100)
TCO2: 25 mmol/L (ref 0–100)
pCO2 arterial: 45.6 mmHg — ABNORMAL HIGH (ref 35.0–45.0)
pCO2 arterial: 40.7 mmHg (ref 35.0–45.0)
pH, Arterial: 7.345 — ABNORMAL LOW (ref 7.350–7.450)
pO2, Arterial: 76 mmHg — ABNORMAL LOW (ref 80.0–100.0)
pO2, Arterial: 54 mmHg — ABNORMAL LOW (ref 80.0–100.0)

## 2014-01-22 LAB — RENAL FUNCTION PANEL
ALBUMIN: 1.4 g/dL — AB (ref 3.5–5.2)
BUN: 7 mg/dL (ref 6–23)
CHLORIDE: 112 meq/L (ref 96–112)
CO2: 16 mEq/L — ABNORMAL LOW (ref 19–32)
Calcium: 5.3 mg/dL — CL (ref 8.4–10.5)
Creatinine, Ser: 0.41 mg/dL — ABNORMAL LOW (ref 0.50–1.35)
Glucose, Bld: 89 mg/dL (ref 70–99)
POTASSIUM: 2.9 meq/L — AB (ref 3.7–5.3)
Phosphorus: 1.9 mg/dL — ABNORMAL LOW (ref 2.3–4.6)
Sodium: 141 mEq/L (ref 137–147)

## 2014-01-22 LAB — LACTIC ACID, PLASMA: Lactic Acid, Venous: 2.3 mmol/L — ABNORMAL HIGH (ref 0.5–2.2)

## 2014-01-22 LAB — COMPREHENSIVE METABOLIC PANEL
ALK PHOS: 126 U/L — AB (ref 39–117)
ALT: 88 U/L — ABNORMAL HIGH (ref 0–53)
AST: 60 U/L — ABNORMAL HIGH (ref 0–37)
Albumin: 2.1 g/dL — ABNORMAL LOW (ref 3.5–5.2)
BUN: 7 mg/dL (ref 6–23)
CO2: 23 mEq/L (ref 19–32)
Calcium: 7.3 mg/dL — ABNORMAL LOW (ref 8.4–10.5)
Chloride: 102 mEq/L (ref 96–112)
Creatinine, Ser: 0.56 mg/dL (ref 0.50–1.35)
GFR calc Af Amer: >90 mL/min (ref >90)
GFR calc non Af Amer: >90 mL/min (ref >90)
Glucose, Bld: 173 mg/dL — ABNORMAL HIGH (ref 70–99)
POTASSIUM: 4 meq/L (ref 3.7–5.3)
SODIUM: 136 meq/L — AB (ref 137–147)
TOTAL PROTEIN: 4.6 g/dL — AB (ref 6.0–8.3)
Total Bilirubin: 5 mg/dL — ABNORMAL HIGH (ref 0.3–1.2)

## 2014-01-22 LAB — PROTIME-INR
INR: 1.25 (ref 0.00–1.49)
PROTHROMBIN TIME: 15.4 s — AB (ref 11.6–15.2)

## 2014-01-22 LAB — PREPARE PLATELET PHERESIS: UNIT DIVISION: 0

## 2014-01-22 LAB — MAGNESIUM: Magnesium: 2 mg/dL (ref 1.5–2.5)

## 2014-01-22 LAB — PREALBUMIN: PREALBUMIN: 8.7 mg/dL — AB (ref 17.0–34.0)

## 2014-01-22 LAB — APTT: aPTT: 41 seconds — ABNORMAL HIGH (ref 24–37)

## 2014-01-22 LAB — TRIGLYCERIDES: Triglycerides: 184 mg/dL — ABNORMAL HIGH (ref <150)

## 2014-01-22 MED ORDER — CHLORHEXIDINE GLUCONATE 0.12 % MT SOLN
OROMUCOSAL | Status: AC
  Filled 2014-01-22: qty 15

## 2014-01-22 MED ORDER — SODIUM CHLORIDE 0.9 % IV SOLN
1.0000 g | Freq: Once | INTRAVENOUS | Status: AC
  Administered 2014-01-22: 1 g via INTRAVENOUS
  Filled 2014-01-22: qty 10

## 2014-01-22 MED ORDER — POTASSIUM CHLORIDE 10 MEQ/50ML IV SOLN
INTRAVENOUS | Status: AC
  Administered 2014-01-22: 10 meq
  Filled 2014-01-22: qty 50

## 2014-01-22 MED ORDER — IOHEXOL 300 MG/ML  SOLN
100.0000 mL | Freq: Once | INTRAMUSCULAR | Status: AC | PRN
  Administered 2014-01-22: 40 mL via INTRAVENOUS

## 2014-01-22 MED ORDER — M.V.I. ADULT IV INJ
INTRAVENOUS | Status: AC
  Administered 2014-01-22: 18:00:00 via INTRAVENOUS
  Filled 2014-01-22: qty 2000

## 2014-01-22 MED ORDER — POTASSIUM CHLORIDE 10 MEQ/50ML IV SOLN
10.0000 meq | INTRAVENOUS | Status: AC
  Administered 2014-01-22: 10 meq via INTRAVENOUS
  Filled 2014-01-22: qty 50

## 2014-01-22 MED ORDER — NOREPINEPHRINE BITARTRATE 1 MG/ML IJ SOLN
2.0000 ug/min | INTRAVENOUS | Status: DC
  Administered 2014-01-22: 12 ug/min via INTRAVENOUS
  Administered 2014-01-24: 9 ug/min via INTRAVENOUS
  Filled 2014-01-22 (×2): qty 16

## 2014-01-22 MED ORDER — HEPARIN SODIUM (PORCINE) 1000 UNIT/ML DIALYSIS: 1000.0000 [IU] | INTRAMUSCULAR | Status: DC | PRN

## 2014-01-22 MED ORDER — POTASSIUM PHOSPHATE DIBASIC 3 MMOLE/ML IV SOLN
40.0000 meq | Freq: Once | INTRAVENOUS | Status: AC
  Administered 2014-01-22: 40 meq via INTRAVENOUS
  Filled 2014-01-22: qty 9.09

## 2014-01-22 MED ORDER — FAT EMULSION 20 % IV EMUL
250.0000 mL | INTRAVENOUS | Status: AC
  Administered 2014-01-22: 250 mL via INTRAVENOUS
  Filled 2014-01-22: qty 250

## 2014-01-22 NOTE — Consult Note (Signed)
Vascular Surgery Consultation  Reason for Consult: Tunneled hemodialysis catheter  HPI: [name] [name] is a 43 y.o. male who presents for evaluation of tunneled hemodialysis catheter for attenuating hemodialysis. Patient currently has temporary catheter in right IJ. Patient has been in the hospital approximately 2 weeks. Was unresponsive and septic. Has had small bowel resection for gangrenous bowel and has currently abdominal wound VAC in place. Had MRSA sepsis. Patient is unresponsive and retracts only to pain. Patient has no family. History of polysubstance abuse. Also schizophrenia.   Past Medical History  Diagnosis Date  . Chronic pain   . Anxiety   . Schizoaffective disorder   . Hearing loss   . Seizure   . Arthritis   . Asthma   . Foot drop   . Anemia   . Dehydration   . Hypotension   . GERD (gastroesophageal reflux disease)   . Hypothyroidism   . Constipation   . Polysubstance abuse   . Cancer   . Leukocytosis    Past Surgical History  Procedure Laterality Date  . Laparotomy N/A 02/10/2014    Procedure: EXPLORATORY LAPAROTOMY;  Surgeon: Gwenyth Ober, MD;  Location: [place];  Service: General;  Laterality: N/A;  . Bowel resection N/A 02/10/2014    Procedure: SMALL BOWEL RESECTION;  Surgeon: Gwenyth Ober, MD;  Location: [place];  Service: General;  Laterality: N/A;  . Application of wound vac N/A 01/16/2014    Procedure: APPLICATION OF WOUND VAC;  Surgeon: Gwenyth Ober, MD;  Location: [place];  Service: General;  Laterality: N/A;   History   Social History  . Marital Status: Married    Spouse Name: N/A    Number of Children: N/A  . Years of Education: N/A   Social History Main Topics  . Smoking status: Current Every Day Smoker -- 1.50 packs/day for 27 years    Types: Cigarettes  . Smokeless tobacco: Never Used  . Alcohol Use: No  . Drug Use: No  . Sexual Activity: Not Currently   Other Topics Concern  . None   Social History Narrative  . None   History  reviewed. No pertinent family history. Allergies  Allergen Reactions  . Fexofenadine Hcl Other (See Comments)    unknown  . Percocet [Oxycodone-Acetaminophen] Other (See Comments)    unknown  . Thorazine [Chlorpromazine Hcl] Other (See Comments)    unknown   Prior to Admission medications   Medication Sig Start Date End Date Taking? Authorizing Provider  albuterol (PROVENTIL HFA;VENTOLIN HFA) 108 (90 BASE) MCG/ACT inhaler Inhale 2 puffs into the lungs every 4 (four) hours as needed for wheezing or shortness of breath.   Yes Historical Provider, MD  ALPRAZolam Duanne Moron) 0.5 MG tablet Take 1 tablet (0.5 mg total) by mouth daily at 12 noon. 07/21/13  Yes Janece Canterbury, MD  benztropine (COGENTIN) 0.5 MG tablet Take 0.5-1 mg by mouth See admin instructions. Take 2 tablets in the morning and 1 tablet at bedtime   Yes Historical Provider, MD  doxepin (SINEQUAN) 50 MG capsule Take 50 mg by mouth at bedtime.   Yes Historical Provider, MD  ferrous sulfate 325 (65 FE) MG tablet Take 325 mg by mouth daily with breakfast.   Yes Historical Provider, MD  FLUoxetine (PROZAC) 10 MG capsule Take 10 mg by mouth daily.   Yes Historical Provider, MD  FLUoxetine (PROZAC) 20 MG capsule Take 20 mg by mouth daily. With 10 mg to = 30 mg   Yes Historical Provider, MD  Fluticasone-Salmeterol (ADVAIR) 100-50 MCG/DOSE AEPB Inhale 1 puff into the lungs 2 (two) times daily.   Yes Historical Provider, MD  guaifenesin (ROBITUSSIN) 100 MG/5ML syrup Take 200 mg by mouth every 4 (four) hours as needed for cough.   Yes Historical Provider, MD  levothyroxine (SYNTHROID, LEVOTHROID) 75 MCG tablet Take 75 mcg by mouth daily.   Yes Historical Provider, MD  loratadine (CLARITIN) 10 MG tablet Take 10 mg by mouth daily.   Yes Historical Provider, MD  megestrol (MEGACE) 400 MG/10ML suspension Take 400 mg by mouth 2 (two) times daily.   Yes Historical Provider, MD  metoprolol tartrate (LOPRESSOR) 25 MG tablet Take 25 mg by mouth 2 (two)  times daily.   Yes Historical Provider, MD  mirtazapine (REMERON) 45 MG tablet Take 22.5 mg by mouth at bedtime.   Yes Historical Provider, MD  Nutritional Supplements (ENSURE PO) Take 1 Can by mouth 3 (three) times daily with meals.   Yes Historical Provider, MD  OLANZapine (ZYPREXA) 15 MG tablet Take 15 mg by mouth at bedtime.   Yes Historical Provider, MD  omeprazole (PRILOSEC) 40 MG capsule Take 40 mg by mouth daily.    Yes Historical Provider, MD  phenytoin (DILANTIN) 100 MG ER capsule Take 300 mg by mouth at bedtime.   Yes Historical Provider, MD  polyethylene glycol powder (GLYCOLAX/MIRALAX) powder Take 17 g by mouth daily.   Yes Historical Provider, MD  simvastatin (ZOCOR) 10 MG tablet Take 10 mg by mouth at bedtime.   Yes Historical Provider, MD  solifenacin (VESICARE) 10 MG tablet Take 10 mg by mouth daily.   Yes Historical Provider, MD  sucralfate (CARAFATE) 1 G tablet Take 1 g by mouth 4 (four) times daily.   Yes Historical Provider, MD  traMADol (ULTRAM) 50 MG tablet Take 50 mg by mouth every 6 (six) hours as needed for moderate pain.   Yes Historical Provider, MD  zolpidem (AMBIEN) 5 MG tablet Take 1 tablet (5 mg total) by mouth at bedtime. 07/21/13  Yes Janece Canterbury, MD  acetaminophen (TYLENOL) 325 MG tablet Take 650 mg by mouth every 6 (six) hours as needed. Pain    Historical Provider, MD  ondansetron (ZOFRAN ODT) 4 MG disintegrating tablet Take 1 tablet (4 mg total) by mouth every 8 (eight) hours as needed for nausea. 03/13/13   Velvet Bathe, MD     Positive ROS: Not available  All other systems have been reviewed and were otherwise negative with the exception of those mentioned in the HPI and as above.  Physical Exam: Filed Vitals:   01/22/14 1147  BP:   Pulse:   Temp: 94.3 F (34.6 C)  Resp:     General: Patient is sedated on the ventilator. Responds only to pain. Has wound Vac on the abdominal wound in place with copious drainage to be checked later today by  [facility] surgery Patient has temporary dialysis catheter and right IJ and currently on hemodialysis  Patient receiving TPN Patient has tracheostomy and PEG but no tube feedings as ye   Assessment/Plan:  Patient currently being now lies through temporary catheter and right IJ . Patient has a lot of issues at present time with wound VAC having copious drainage-to be checked by [facility] surgery later today  Will check back on Monday to see if patient is stable enough for his candidate for trip to surgery for hemodialysis catheter placement If not stable enough at that time will defer to later in the week  Tinnie Gens, MD 01/22/2014 3:48 PM

## 2014-01-22 NOTE — Progress Notes (Signed)
Patient ID: [name] [name], male   DOB: October 12, 1971, 43 y.o.   MRN: [id number] 1 Day Post-Op  Subjective: Pt trached.  arouses  Objective: Vital signs in last 24 hours: Temp:  [93 F (33.9 C)-99.3 F (37.4 C)] 93 F (33.9 C) (04/11 0400) Pulse Rate:  [77-111] 77 (04/11 0835) Resp:  [0-28] 16 (04/11 0835) BP: (75-132)/(42-84) 99/53 mmHg (04/11 0835) SpO2:  [90 %-100 %] 99 % (04/11 0842) Arterial Line BP: (81-136)/(41-89) 100/54 mmHg (04/11 0700) FiO2 (%):  [30 %-40 %] 40 % (04/11 0842) Weight:  [138 lb 3.7 oz (62.7 kg)] 138 lb 3.7 oz (62.7 kg) (04/11 0315) Last BM Date: 02/09/2014  Intake/Output from previous day: 04/10 0701 - 04/11 0700 In: 5510.4 [I.V.:3735.4; Blood:195; IV Piggyback:400; TPN:1120] Out: 7262 [Emesis/NG output:100; Drains:3650] Intake/Output this shift:    PE: Abd: soft, VAC appears intact, however he has had approx 1350cc out in last 6 hours.  Serous appearing output.  Not bloody  Lab Results:   Recent Labs  01/17/2014 1700 01/22/14 0400  WBC 22.5* 19.6*  HGB 10.4* 10.0*  HCT 28.9* 28.7*  PLT 70* 44*   BMET  Recent Labs  01/20/2014 1600 01/22/14 0400  NA 134* 136*  K 4.4 4.0  CL 99 102  CO2 23 23  GLUCOSE 151* 173*  BUN 8 7  CREATININE 0.76 0.56  CALCIUM 7.6* 7.3*   PT/INR  Recent Labs  01/31/2014 0500 01/22/14 0400  LABPROT 17.0* 15.4*  INR 1.42 1.25   CMP     Component Value Date/Time   NA 136* 01/22/2014 0400   K 4.0 01/22/2014 0400   CL 102 01/22/2014 0400   CO2 23 01/22/2014 0400   GLUCOSE 173* 01/22/2014 0400   BUN 7 01/22/2014 0400   CREATININE 0.56 01/22/2014 0400   CALCIUM 7.3* 01/22/2014 0400   PROT 4.6* 01/22/2014 0400   ALBUMIN 2.1* 01/22/2014 0400   AST 60* 01/22/2014 0400   ALT 88* 01/22/2014 0400   ALKPHOS 126* 01/22/2014 0400   BILITOT 5.0* 01/22/2014 0400   GFRNONAA >90 01/22/2014 0400   GFRAA >90 01/22/2014 0400   Lipase     Component Value Date/Time   LIPASE 34 01/20/2014 0430       Studies/Results: US  Scrotum  01/22/2014   CLINICAL DATA:  Swelling.  EXAM: ULTRASOUND OF SCROTUM  TECHNIQUE: Complete ultrasound examination of the testicles, epididymis, and other scrotal structures was performed.  COMPARISON:  CT chest abdomen and pelvis 01/17/2014  FINDINGS: Right testicle  Measurements: 3 x 2.2 x 1.9 cm. Heterogeneous parenchymal appearance throughout the testis with geographic areas of low attenuation change. No circumscribed a mass or contour deformity is identified. Normal testicular flow.  Left testicle  Measurements: 3.1 x 2 x 2.4 cm. Heterogeneous parenchymal appearance throughout the testis with geographic areas of low attenuation change. No circumscribed mass or contour deformity is identified. Normal testicular flow.  Right epididymis:  Normal in size and appearance.  Left epididymis:  Normal in size and appearance.  Hydrocele:  Small bilateral hydroceles.  Varicocele:  None visualized.  There is prominent diffuse soft tissue edema throughout the scrotal sac. No loculated fluid collection or abscess is identified.  IMPRESSION: Diffuse scrotal edema/ swelling. No focal abscess. Small bilateral hydroceles. Heterogeneous appearance of the testes bilaterally without focal contour deformity. Appearance is indeterminate and could represent inflammatory or infiltrative process. Lymphoma or infiltrating neoplasm is not excluded.   Electronically Signed   By: Lucienne Capers M.D.   On: 01/22/2014 02:34  Dg Chest Port 1 View  01/12/2014   CLINICAL DATA:  Endotracheal tube.  Pleural effusion.  EXAM: PORTABLE CHEST - 1 VIEW  COMPARISON:  01/20/2014  FINDINGS: Endotracheal tube has its tip 4 cm above the carina. Nasogastric tube enters the stomach. Right internal jugular central line has its tip at the azygos level of the SVC. Left internal jugular central line has its tip in the SVC 4 cm above the right atrium. Pneumonia throughout both lower lungs process, right more than left. Small amount of pleural fluid  again seen on the right. Findings are quite similar compared to the previous study. No new finding.  IMPRESSION: Lines and tubes well positioned. Persistent bilateral pulmonary infiltrates right worse than left.   Electronically Signed   By: Nelson Chimes M.D.   On: 02/02/2014 07:11    Anti-infectives: Anti-infectives   Start     Dose/Rate Route Frequency Ordered Stop   01/20/14 1300  vancomycin (VANCOCIN) IVPB 1000 mg/200 mL premix     1,000 mg 200 mL/hr over 60 Minutes Intravenous Every 24 hours 01/20/14 1152     01/13/2014 0000  meropenem (MERREM) 1 g in sodium chloride 0.9 % 100 mL IVPB     1 g 200 mL/hr over 30 Minutes Intravenous Every 12 hours 01/17/14 1405     01/17/14 1300  vancomycin (VANCOCIN) IVPB 750 mg/150 ml premix  Status:  Discontinued     750 mg 150 mL/hr over 60 Minutes Intravenous Every 24 hours 01/17/14 1211 01/20/14 1152   01/17/14 1000  meropenem (MERREM) 2 g in sodium chloride 0.9 % 100 mL IVPB  Status:  Discontinued     2 g 200 mL/hr over 30 Minutes Intravenous Every 18 hours 01/17/14 0913 01/17/14 1405   01/17/14 0000  vancomycin (VANCOCIN) 50 mg/mL oral solution 500 mg  Status:  Discontinued     500 mg Oral 4 times per day 01/16/14 2008 01/19/2014 0952   01/17/14 0000  piperacillin-tazobactam (ZOSYN) IVPB 3.375 g  Status:  Discontinued     3.375 g 100 mL/hr over 30 Minutes Intravenous 4 times per day 01/16/14 2202 01/17/14 0854   01/16/14 2200  metroNIDAZOLE (FLAGYL) tablet 500 mg  Status:  Discontinued     500 mg Oral 3 times per day 01/16/14 1905 01/16/14 2002   01/16/14 2015  metroNIDAZOLE (FLAGYL) IVPB 500 mg  Status:  Discontinued     500 mg 100 mL/hr over 60 Minutes Intravenous Every 8 hours 01/16/14 2002 01/12/2014 1001   01/16/14 2000  vancomycin (VANCOCIN) 50 mg/mL oral solution 250 mg  Status:  Discontinued     250 mg Oral 4 times per day 01/16/14 1957 01/16/14 2008   01/16/14 2000  micafungin (MYCAMINE) 100 mg in sodium chloride 0.9 % 100 mL IVPB  Status:   Discontinued     100 mg 100 mL/hr over 1 Hours Intravenous Daily 01/16/14 1957 2014-02-18 1117   01/16/14 1800  piperacillin-tazobactam (ZOSYN) IVPB 3.375 g  Status:  Discontinued     3.375 g 12.5 mL/hr over 240 Minutes Intravenous 4 times per day 01/16/14 1648 01/16/14 2202   01/12/14 1200  piperacillin-tazobactam (ZOSYN) IVPB 2.25 g  Status:  Discontinued     2.25 g 100 mL/hr over 30 Minutes Intravenous 4 times per day 01/12/14 0844 01/16/14 1648   01/12/14 0500  vancomycin (VANCOCIN) 500 mg in sodium chloride 0.9 % 100 mL IVPB  Status:  Discontinued     500 mg 100 mL/hr over 60 Minutes Intravenous  Every 24 hours 01/11/14 0902 01/12/14 1242   01/10/14 0400  vancomycin (VANCOCIN) IVPB 750 mg/150 ml premix  Status:  Discontinued     750 mg 150 mL/hr over 60 Minutes Intravenous Every 24 hours 01/09/14 0353 01/11/14 0902   01/09/14 1200  piperacillin-tazobactam (ZOSYN) IVPB 3.375 g  Status:  Discontinued     3.375 g 12.5 mL/hr over 240 Minutes Intravenous Every 8 hours 01/09/14 0353 01/12/14 0844   01/09/14 0330  piperacillin-tazobactam (ZOSYN) IVPB 3.375 g     3.375 g 100 mL/hr over 30 Minutes Intravenous  Once 01/09/14 0329 01/09/14 0358   01/09/14 0330  vancomycin (VANCOCIN) IVPB 1000 mg/200 mL premix     1,000 mg 200 mL/hr over 60 Minutes Intravenous  Once 01/09/14 0329 01/09/14 0437       Assessment/Plan  POD 4/1 for ex lap with SBR and anastomosis of small bowel  POD s/p trach Patient Active Problem List   Diagnosis Date Noted  . Acute renal failure 01/11/2014  . Protein-calorie malnutrition, severe 01/10/2014  . Septic shock(785.52) 01/10/2014  . Sepsis 01/09/2014  . Fever 09/12/2013  . Knee pain, bilateral 09/12/2013  . HCAP (healthcare-associated pneumonia) 07/21/2013  . Cigarette nicotine dependence with withdrawal 07/21/2013  . Acute respiratory failure with hypoxia 07/21/2013  . Normocytic anemia 07/21/2013  . SOB (shortness of breath) 07/18/2013  . COPD with acute  exacerbation 07/18/2013  . Hypoxemia 07/18/2013  . Cachexia 07/18/2013  . Hypokalemia 03/10/2013  . Leukocytosis, unspecified 03/10/2013  . Dehydration   . Hypotension   . GERD (gastroesophageal reflux disease)   . Hypothyroidism   . Constipation   . Polysubstance abuse    Plan: 1. Due to significant VAC output, we will remove VAC today and rule out fascial dehiscence although there is no bulge to dressing. 2.  Leave g-tube to gravity.  No ready to start tube feeds yet  LOS: 14 days    Henreitta Cea 01/22/2014, 8:53 AM Pager: [phone]

## 2014-01-22 NOTE — Sedation Documentation (Signed)
Pt arrived to IR room with vent and RT, RN, gtts versed 2 mg/ml and fentanyl 200 mcgs/ hr via.  Bolus of 100 mcg fentanyl and 2 mg versed given for comfort

## 2014-01-22 NOTE — Progress Notes (Signed)
Subjective: Interval History: none.  Objective: Vital signs in last 24 hours: Temp:  [93 F (33.9 C)-99.3 F (37.4 C)] 93 F (33.9 C) (04/11 0400) Pulse Rate:  [79-111] 81 (04/11 0700) Resp:  [0-28] 17 (04/11 0700) BP: (75-132)/(42-84) 93/71 mmHg (04/11 0700) SpO2:  [90 %-100 %] 99 % (04/11 0700) Arterial Line BP: (81-136)/(41-89) 100/54 mmHg (04/11 0700) FiO2 (%):  [30 %-40 %] 40 % (04/11 0400) Weight:  [62.7 kg (138 lb 3.7 oz)] 62.7 kg (138 lb 3.7 oz) (04/11 0315) Weight change: -0.5 kg (-1 lb 1.6 oz)  Intake/Output from previous day: 04/10 0701 - 04/11 0700 In: 5510.4 [I.V.:3735.4; Blood:195; IV Piggyback:400; TPN:1120] Out: 8588 [Emesis/NG output:100; Drains:3650] Intake/Output this shift:    General appearance: sedated on vent, moves arms Neck: RIJ cath Resp: rales bibasilar and wheezes bibasilar Cardio: S1, S2 normal and systolic murmur: holosystolic 2/6, blowing at apex GI: no bs, vac midline Extremities: edema 3+  Lab Results:  Recent Labs  01/31/2014 1700 01/22/14 0400  WBC 22.5* 19.6*  HGB 10.4* 10.0*  HCT 28.9* 28.7*  PLT 70* 44*   BMET:  Recent Labs  01/13/2014 1600 01/22/14 0400  NA 134* 136*  K 4.4 4.0  CL 99 102  CO2 23 23  GLUCOSE 151* 173*  BUN 8 7  CREATININE 0.76 0.56  CALCIUM 7.6* 7.3*   No results found for this basename: PTH,  in the last 72 hours Iron Studies:  Recent Labs  01/13/2014 0807  IRON 117  TIBC 150*    Studies/Results: US Scrotum  01/22/2014   CLINICAL DATA:  Swelling.  EXAM: ULTRASOUND OF SCROTUM  TECHNIQUE: Complete ultrasound examination of the testicles, epididymis, and other scrotal structures was performed.  COMPARISON:  CT chest abdomen and pelvis 01/17/2014  FINDINGS: Right testicle  Measurements: 3 x 2.2 x 1.9 cm. Heterogeneous parenchymal appearance throughout the testis with geographic areas of low attenuation change. No circumscribed a mass or contour deformity is identified. Normal testicular flow.  Left  testicle  Measurements: 3.1 x 2 x 2.4 cm. Heterogeneous parenchymal appearance throughout the testis with geographic areas of low attenuation change. No circumscribed mass or contour deformity is identified. Normal testicular flow.  Right epididymis:  Normal in size and appearance.  Left epididymis:  Normal in size and appearance.  Hydrocele:  Small bilateral hydroceles.  Varicocele:  None visualized.  There is prominent diffuse soft tissue edema throughout the scrotal sac. No loculated fluid collection or abscess is identified.  IMPRESSION: Diffuse scrotal edema/ swelling. No focal abscess. Small bilateral hydroceles. Heterogeneous appearance of the testes bilaterally without focal contour deformity. Appearance is indeterminate and could represent inflammatory or infiltrative process. Lymphoma or infiltrating neoplasm is not excluded.   Electronically Signed   By: Lucienne Capers M.D.   On: 01/22/2014 02:34   Dg Chest Port 1 View  01/15/2014   CLINICAL DATA:  Endotracheal tube.  Pleural effusion.  EXAM: PORTABLE CHEST - 1 VIEW  COMPARISON:  01/20/2014  FINDINGS: Endotracheal tube has its tip 4 cm above the carina. Nasogastric tube enters the stomach. Right internal jugular central line has its tip at the azygos level of the SVC. Left internal jugular central line has its tip in the SVC 4 cm above the right atrium. Pneumonia throughout both lower lungs process, right more than left. Small amount of pleural fluid again seen on the right. Findings are quite similar compared to the previous study. No new finding.  IMPRESSION: Lines and tubes well positioned. Persistent bilateral  pulmonary infiltrates right worse than left.   Electronically Signed   By: Nelson Chimes M.D.   On: 01/25/2014 07:11    I have reviewed the patient's current medications.  Assessment/Plan: 1 AKI oliguric ATN.  Vol xs on exam and CXR.  CVP11-14.  Will slowly lower vol. Still on low dose pressors. Acid/base/k/Phos ok. Good solute  clearance.  Will also get [facility] cath in 1st of week. 2 Anemia stable. 3 Nutrition on TNA 4 Ischemic bowel  Per CCS 5 VDRF per CCM . O2 ok, but not as good.  ? Wet 6 Infx on Vanc/merum 7 Schizoaffective P cont CRRT, PC this week , AB, vent, try net neg of 25.      LOS: 14 days   Lynnwood Beckford L Tiearra Colwell 01/22/2014,7:09 AM

## 2014-01-22 NOTE — Procedures (Signed)
Successful placement of an infrarenal IVC filter via the R CFV. No immediate complications.

## 2014-01-22 NOTE — Progress Notes (Signed)
eLink Physician-Brief Progress Note Patient Name: [name] [name] DOB: [birth year] MRN: [id number]  Date of Service  01/22/2014   HPI/Events of Note   No change in vent for several hours. Room based on ABG from 0400. Electrolyte abnormalities noted.   eICU Interventions   Repeat ABG now, titrate vent as able. Replete electrolytes.   Intervention Category Major Interventions: Acid-Base disturbance - evaluation and management;Electrolyte abnormality - evaluation and management  Mariea Clonts 01/22/2014, 5:50 PM

## 2014-01-22 NOTE — Progress Notes (Signed)
PULMONARY / CRITICAL CARE MEDICINE   Name: [name] [name] MRN: [id number] DOB: 1971-09-07    ADMISSION DATE:  01/07/2014  PRIMARY SERVICE: PCCM  CHIEF COMPLAINT:  SOB, leg pain.  BRIEF PATIENT DESCRIPTION:  43 years old male resident of an assisted living facility with PMH relevant for schizoaffective disorder, polysubstance abuse, seizures, COPD,  malnutrition, GERD. Presented with SOB and found to be hypotensive despite 2 L of IVF's. Started on dopamine via peripheral IV by ED and  PCCM called to admit. No clear source of infection.   SIGNIFICANT EVENTS / STUDIES:  3/29 Admitted to PCCM with septic shock and HCAP. Resuscitated with IVFs, vasopressors. Intubated shortly after admission. Bicarbonate infusion initiated for severe metabolic acidosis [id number] Persistent septic shock. Worsening renal function. Acidosis improved. HCO3 stopped 3/31 Off vasopressors. Severe agitation @ times. Fentanyl and dex infusions initiated. Not able to F/C. Oliguric with worsening renal indices. Foley cath changed out without improvement in Uo. No indication for HD at this time 4/1 Close to extubation, urine output slightly improved, will need to contact wife for discussion of goals of care 4/2 Still agitated, self-extubated, re-intubated successfully. Continued worsening renal function. No HCPA or family/significant other contact. Will consult with ethics committee to resume responsibility for [facility] decisions.  4/3 Renal function no worse. Hepatic function improving.  4/4 Started phenylephrine  4/5 Continued hypotension and oliguria. Unequal pupils. Severe metabolic acidosis, started on bicarb drip and CRRT. Started levophed. Started stress-steroids. Panculture for source of infection (likely abdominal). 4/6  Slowly improving acidosis on CRRT.  4/7 Improving acidosis, CT head w/o acute abnormality 4/6 CT abdo/pelvis distended and fluid-filled small bowel and colon  4/6 CT chest  large bilateral pleural effusions  with overlying atelectasis 4/7 Ischemic small bowel resection in OR. Transfused with ffp, vit K, and pRBCs  4/8 Bleeding with thrombocytopenia. Transfused with platelets and pRBC. Bedside wound vac change 4/9 TPN started 4/10 To go to OR for VAC change and  possible bowel anastomosis. Also possible tracheostomy in OR. 4/10 Lt femoral vein DVT   LINES / TUBES: L IJ CVL 3/29 >>  ETT 3/29 >> 4/2, 4/2 (self extubated and re-intubated) >> 4/10 4/10 trach  (OR ) >>  HD RIJ catheter 4/5 >> A-line 4/5 >> Foley   CULTURES: MRSA PCR 3/29 >> POS Urine 3/29 >> NG Blood 3/28 >> NG Blood 3/29 >> NG Blood 4/5 >>NGTD Urine 4/5 >>NG Tracheal 3/31 >>  NG C diff 4/3 >> Neg C diff 4/5 >> Neg Gi Pathogen 4/5>> Neg  Tracheal 4/5 >>moderate Candida Albicans  ANTIBIOTICS: IV Vanc 3/29 >> 4/2; 4/6 >> Pip-tazo 3/29 >> 4/6 IV flagyl 4/5 >>4/7 PO vanc 4/6 >>4/7 IV micafungin 4/5 >>4/8 Meropenem 4/7 >>  SUBJECTIVE:  No acute events overnight.   Pt sedated.  Hypothermic on CVVH Increased drainage from wound vac.   VITAL SIGNS: Temp:  [93 F (33.9 C)-99.3 F (37.4 C)] 93 F (33.9 C) (04/11 0400) Pulse Rate:  [77-111] 77 (04/11 0835) Resp:  [0-28] 16 (04/11 0835) BP: (75-132)/(42-84) 99/53 mmHg (04/11 0835) SpO2:  [91 %-100 %] 99 % (04/11 0842) Arterial Line BP: (81-136)/(41-89) 100/54 mmHg (04/11 0700) FiO2 (%):  [30 %-40 %] 40 % (04/11 0842) Weight:  [62.7 kg (138 lb 3.7 oz)] 62.7 kg (138 lb 3.7 oz) (04/11 0315) HEMODYNAMICS: CVP:  [10 mmHg-16 mmHg] 11 mmHg VENTILATOR SETTINGS: Vent Mode:  [-] PRVC FiO2 (%):  [30 %-40 %] 40 % Set Rate:  [16 bmp-20 bmp] 16 bmp Vt  Set:  [570 mL] 570 mL PEEP:  [8 cmH20] 8 cmH20 Plateau Pressure:  [17 cmH20-29 cmH20] 29 cmH20 INTAKE / OUTPUT: Intake/Output     04/10 0701 - 04/11 0700 04/11 0701 - 04/12 0700   I.V. (mL/kg) 3735.4 (59.6)    Blood 195    Other 60    IV Piggyback 400    TPN 1120    Total Intake(mL/kg) 5510.4 (87.9)    Emesis/NG  output 100    Drains 3650    Other 1081    Total Output 4831     Net +679.4            PHYSICAL EXAMINATION: General: critically ill, sedated  HEENT: WNL Cardiac: RRR  Lungs: Clear to ausculation in anterior fields   Abdomen: firm, distended, no BS, wound vac in place -high output Ext: SCD's in place, cool, +3-4 edema up to abdomen GU: scrotal swelling Neuro: Unequal pupil  5 mm L>4 mm   PULMONARY  Recent Labs Lab 02/05/2014 1414 01/15/2014 1506 01/31/2014 1627 01/20/14 0902 02/03/2014 1114 01/22/14 0430  PHART 7.278*  --  7.479* 7.530* 7.422 7.329*  PCO2ART 55.8*  --  34.0* 28.1* 34.1* 45.6*  PO2ART 63.0*  --  346.0* 46.0* 52.0* 76.0*  HCO3 26.1*  --  25.3* 24.0 22.4 24.9*  TCO2 28 24 26 25 23 26   O2SAT 88.0  --  100.0 91.0 88.0 96.0    CBC  Recent Labs Lab 02/09/2014 0500 01/14/2014 1700 01/22/14 0400  HGB 11.8* 10.4* 10.0*  HCT 31.2* 28.9* 28.7*  WBC 16.9* 22.5* 19.6*  PLT 44* 70* 44*    COAGULATION  Recent Labs Lab 02/02/2014 1330 02/05/2014 0430 01/20/14 1700 01/30/2014 0500 01/22/14 0400  INR 1.91* 1.92* 1.63* 1.42 1.25     CHEMISTRY  Recent Labs Lab 01/25/2014 0430  01/20/14 0433 01/20/14 1500 01/20/14 1700 02/09/2014 0500 02/10/2014 1600 01/22/14 0400  NA 137  < > 138 137  --  135* 134* 136*  K 4.0  < > 3.6* 3.7  --  4.3 4.4 4.0  CL 103  < > 102 103  --  100 99 102  CO2 26  < > 24 22  --  22 23 23   GLUCOSE 138*  < > 86 105*  --  179* 151* 173*  BUN 7  < > 4* 3*  --  4* 8 7  CREATININE 0.67  < > 0.50 0.47*  --  0.45* 0.76 0.56  CALCIUM 6.7*  < > 7.3* 6.9*  --  7.5* 7.6* 7.3*  MG 2.0  --  1.9  --  2.0 2.1  --  2.0  PHOS 1.3*  < > 0.8* 1.9* 1.1* 1.9* 5.1* 2.4  < > = values in this interval not displayed. Estimated Creatinine Clearance: 105.6 ml/min (by C-G formula based on Cr of 0.56).   LIVER  Recent Labs Lab February 02, 2014 0430 02/02/14 1330  02/05/2014 0430  01/20/14 0433 01/20/14 1500 01/20/14 1700 01/14/2014 0500 01/15/2014 1600 01/22/14 0400   AST 351*  --   --  171*  --  124*  --   --  90*  --  60*  ALT 495*  --   --  252*  --  155*  --   --  136*  --  88*  ALKPHOS 138*  --   --  111  --  164*  --   --  151*  --  126*  BILITOT 1.8*  --   --  4.5*  --  7.1*  --   --  5.8*  --  5.0*  PROT 4.3*  --   --  4.1*  --  4.4*  --   --  4.5*  --  4.6*  ALBUMIN 1.2*  --   < > 1.6*  1.5*  < > 1.9* 2.1*  --  1.9* 2.3* 2.1*  INR 2.28* 1.91*  --  1.92*  --   --   --  1.63* 1.42  --  1.25  < > = values in this interval not displayed.   INFECTIOUS  Recent Labs Lab 01/15/14 1433 01/16/14 0500  01/17/14 0447  01/12/2014 1430 01/20/14 0500 01/22/14 0400  LATICACIDVEN  --   --   < >  --   < > 3.0* 2.7* 2.3*  PROCALCITON 2.43 3.50  --  2.66  --   --   --   --   < > = values in this interval not displayed.   ENDOCRINE CBG (last 3)   Recent Labs  02/10/2014 2353 01/22/14 0417 01/22/14 0722  GLUCAP 172* 170* 147*     IMAGING x48h  US Scrotum  01/22/2014   CLINICAL DATA:  Swelling.  EXAM: ULTRASOUND OF SCROTUM  TECHNIQUE: Complete ultrasound examination of the testicles, epididymis, and other scrotal structures was performed.  COMPARISON:  CT chest abdomen and pelvis 01/17/2014  FINDINGS: Right testicle  Measurements: 3 x 2.2 x 1.9 cm. Heterogeneous parenchymal appearance throughout the testis with geographic areas of low attenuation change. No circumscribed a mass or contour deformity is identified. Normal testicular flow.  Left testicle  Measurements: 3.1 x 2 x 2.4 cm. Heterogeneous parenchymal appearance throughout the testis with geographic areas of low attenuation change. No circumscribed mass or contour deformity is identified. Normal testicular flow.  Right epididymis:  Normal in size and appearance.  Left epididymis:  Normal in size and appearance.  Hydrocele:  Small bilateral hydroceles.  Varicocele:  None visualized.  There is prominent diffuse soft tissue edema throughout the scrotal sac. No loculated fluid collection or abscess is  identified.  IMPRESSION: Diffuse scrotal edema/ swelling. No focal abscess. Small bilateral hydroceles. Heterogeneous appearance of the testes bilaterally without focal contour deformity. Appearance is indeterminate and could represent inflammatory or infiltrative process. Lymphoma or infiltrating neoplasm is not excluded.   Electronically Signed   By: Lucienne Capers M.D.   On: 01/22/2014 02:34   Dg Chest Port 1 View  02/03/2014   CLINICAL DATA:  Endotracheal tube.  Pleural effusion.  EXAM: PORTABLE CHEST - 1 VIEW  COMPARISON:  01/20/2014  FINDINGS: Endotracheal tube has its tip 4 cm above the carina. Nasogastric tube enters the stomach. Right internal jugular central line has its tip at the azygos level of the SVC. Left internal jugular central line has its tip in the SVC 4 cm above the right atrium. Pneumonia throughout both lower lungs process, right more than left. Small amount of pleural fluid again seen on the right. Findings are quite similar compared to the previous study. No new finding.  IMPRESSION: Lines and tubes well positioned. Persistent bilateral pulmonary infiltrates right worse than left.   Electronically Signed   By: Nelson Chimes M.D.   On: 02/10/2014 07:11     ASSESSMENT / PLAN:  PULMONARY A: Ventilator Dependent Respiratory Failure   -  ARDS Large bilateral effusions  HCAP     -bilateral lower lobes R>L COPD - emphysema  P:   Continue ventilator support, consider SBT after  ph corrected ARDS protocol, drop PEEP Albuterol neb and duoneb PRN, limit when we can in ards   CARDIOVASCULAR A:  Septic shock on pressor support - - 2D- echo -nml LV fn Hypovolemia as well contributing from abdo output Adrenal Insufficency    -AM cortisol 14.4 P:  -Levophed started 4/5 to maintain MAP > 60 mm Hg -IV hydrocortisone 50 mg Q 6 hr started 4/9, maintain -bolus and assess response -add maintenance volume to match output   RENAL A:   Acute kidney failure, oligo-anuric -   likely due to ATN (severe sepsis)     -Cr wnl, still oliguric on CRRT Anion Gap Metabolic Acidosis due to lactic acidosis - resolved  Hypokalemia  Hypocalcemia Hypophosphatemia P:   Renal following Continue CRRT -keep even Avoid nephrotoxins keep foley Attempt albumin  GASTROINTESTINAL A:   Ischemic Small Bowel s/p resection -wound vac  Acute liver failure due to severe sepsis   - improving function Mild pancreatitis - resolved GERD  P:  Surgery following, concern for fistula , high output NPO, bowel rest Fentanyl infusion pain Ct TPN started 4/9 Hold tube feedings osmolite and pro-stat initiated 3/31 Wound vac changes per surgery  HEMATOLOGIC A:   Thrombocytopenia s/p transfusion   - active bleeding,  likely due to liver dysfunction and severe sepsis, not DIC (factor 8 wnl) Acute on Chronic Normocytic Anemia s/p transfusion     -Hg stable  Coagulapathy   - INR and PTT normalizing  LLE DVT P:  Hold heparin, F/U HIT panel  Transfuse platelets, as needed  Monitor CBC Maintain SCD's IVC filter placement    INFECTIOUS A:   Severe sepsis   - afebrile, improving leukocytosis  Leukocytosis   RLL HCAP, NOS HIV neg P:   Cont meropenem, IV vancomycin, remains some hypothermia on cvvhd  dc vanc   ENDOCRINE A:   Adrenal Insufficiency   -AM cortisol 14 Hypothyroidism   - TSH wnl Hypo/hyper glycemia P:   Cont L-thyroxine  SSI with CBG Q 4 hr IV hydrocortisone 50 mg Q 6 hr started 4/9 F/U ACTH level   NEUROLOGIC/PSYCHOSOCIAL A:   Anisocoria at baseline? Acute encephalopathy with severe agitation Chronic b/l Cerebellar Encephalomalacia  Schizoaffective disorder Seizure disorder No HCPOA identified P:   Fentanyl infusion and PRN pain  Hold home Remeron, Zyprexa, Cogentin  IV phenytoin per pharmacy, consider restart WUA daily if able to tolerate Continue soft restraints  goal dc versed, pending belly  Care during the described time interval was  provided by me and/or other providers on the critical care team.  I have reviewed this patient's available data, including medical history, events of note, physical examination and test results as part of my evaluation  CC time x 40 m  Kara Mead MD. Shade Flood. [facility] Pager [phone] If no response call [place]   01/22/2014 9:23 AM

## 2014-01-22 NOTE — Progress Notes (Signed)
Pt. to Interventional Radiology this a.m. for filter, transport uneventful.

## 2014-01-22 NOTE — Progress Notes (Signed)
PARENTERAL NUTRITION CONSULT NOTE- Follow Up  Pharmacy Consult for TPN Indication: Massive Bowel Resection  Allergies  Allergen Reactions  . Fexofenadine Hcl Other (See Comments)    unknown  . Percocet [Oxycodone-Acetaminophen] Other (See Comments)    unknown  . Thorazine [Chlorpromazine Hcl] Other (See Comments)    unknown   Patient Measurements: Height: 6' 2" (188 cm) Weight: 138 lb 3.7 oz (62.7 kg) IBW/kg (Calculated) : 82.2  Vital Signs: Temp: 93 F (33.9 C) (04/11 0400) Temp src: Oral (04/11 0400) BP: 93/71 mmHg (04/11 0700) Pulse Rate: 81 (04/11 0700) Intake/Output from previous day: 04/10 0701 - 04/11 0700 In: 5510.4 [I.V.:3735.4; Blood:195; IV Piggyback:400; TPN:1120] Out: 4831 [Emesis/NG output:100; Drains:3650] Intake/Output from this shift:    Labs:  Recent Labs  01/20/14 1700 01/13/2014 0500 01/14/2014 1700 01/22/14 0400  WBC 19.3* 16.9* 22.5* 19.6*  HGB 11.3* 11.8* 10.4* 10.0*  HCT 30.7* 31.2* 28.9* 28.7*  PLT 47* 44* 70* 44*  APTT 40* 38*  --  41*  INR 1.63* 1.42  --  1.25     Recent Labs  01/20/14 0433  01/20/14 1700 01/20/2014 0500 02/04/2014 1600 01/22/14 0400  NA 138  < >  --  135* 134* 136*  K 3.6*  < >  --  4.3 4.4 4.0  CL 102  < >  --  100 99 102  CO2 24  < >  --  22 23 23  GLUCOSE 86  < >  --  179* 151* 173*  BUN 4*  < >  --  4* 8 7  CREATININE 0.50  < >  --  0.45* 0.76 0.56  CALCIUM 7.3*  < >  --  7.5* 7.6* 7.3*  MG 1.9  --  2.0 2.1  --  2.0  PHOS 0.8*  < > 1.1* 1.9* 5.1* 2.4  PROT 4.4*  --   --  4.5*  --  4.6*  ALBUMIN 1.9*  < >  --  1.9* 2.3* 2.1*  AST 124*  --   --  90*  --  60*  ALT 155*  --   --  136*  --  88*  ALKPHOS 164*  --   --  151*  --  126*  BILITOT 7.1*  --   --  5.8*  --  5.0*  TRIG  --   --   --   --   --  184*  < > = values in this interval not displayed. Estimated Creatinine Clearance: 105.6 ml/min (by C-G formula based on Cr of 0.56).    Recent Labs  01/20/2014 2353 01/22/14 0417 01/22/14 0722  GLUCAP  172* 170* 147*   Insulin Requirements in the past 24 hours:  6 units SSI  Current Nutrition:  Clinimix E 5/15 at 30mL/hr + 20% lipid emulsion at 10mL/hr- provides 36g protein and 991 kcal daily Also on D5 at 20mL/hr which provides an additional 96 kcal from dextrose  Nutritional Goals:  1700 kcal, 85-100g protein daily per RD recommendations 4/10   Assessment: 42 YOM who presented to [facility] with septic shock and HCAP on 3/29 was intubated shortly after admission. 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 Feeding supplements were stopped 4/8 and he is now NPO with bowel rest. He has a history of GERD with chronic PPI use. Currently with acute liver failure due to severe sepsis. S/p tracheostomy, partial small bowel resection and   re-anastomosis with application of wound vac on 4/10. Drain output 4/10 > ~3.8L. Per surgery, not ready to start tube feeds yet.   AC: New LE DVT on U/S. Cannot anticoagulate. Planning for IVC filter today.   Endo: CBGs 147-172 in last 24 hours. Patient was started on stress steroids yesterday and is now requiring some SSI (had not been previously)  Lytes: Na 136, K 4, Phos 2.4 (repleted yesterday with NaPhos 59mol by renal), Mg 2, CorrCa 9.62. Since patient was on tube feeds till 4/5, likely low risk for re-feeding syndrome.  Renal: SCr 0.56, On CVVHDF with BFR 2539mmin- trying to keep even  Pulm: 90% on 40% FiO2, PEEP 8. S/p Trach on 4/10   Cards: BP 99/53, HR70-110s, On Levophed    Hepatobil: AST/ALT elevated but trending down- today 60/88, respectively. Alk Phos elevated at 126 (now trending down), TBilli down but still elevated at 5. TG elevated at 184   Neuro: hx schizoaffective disorder, seizures and polysubstance abuse. On fentanyl and versed.  ID: Severe sepsis, On Merrem and Vancomycin. May consider dc vanc if no MRSA or eneterococcus. WBC 19.6, HYPOthermic  Best Practices: SCDs, IV Pepcid  (In TPN bag)   TPN Access: CVC Triple lumen 3/29 TPN day#: 2 (started 4/9)  Plan:  1. Increase Clinimix E 5/15 to 75 mL/hr + lipid 20% emulsion at 10 mL/hr- this will provide 90g protein and 1758 kcal daily (100% of goal) 2. Multivitamin daily and trace elements MWF only due to shortage- with elevated TBili, will only provide HALF trace elements on MWF. 3. Continue SSI and CBGs 6 times daily 4. Due to shortage of IV Protonix, SUP changed to IV Pepcid in TPN. Will add 4091mo TPN bag but since patient will not receive entire bag at current rate, he will be receiving 30m48m Pepcid daily. 5. F/u AM labs   BenjAlbertina ParrarmD.  Clinical Pharmacist Pager [phone]

## 2014-01-23 ENCOUNTER — Encounter (HOSPITAL_COMMUNITY): Admission: EM | Disposition: E | Payer: Self-pay | Source: Skilled Nursing Facility | Attending: Internal Medicine

## 2014-01-23 ENCOUNTER — Inpatient Hospital Stay (HOSPITAL_COMMUNITY): Payer: Medicare Other

## 2014-01-23 LAB — CULTURE, BLOOD (ROUTINE X 2)
CULTURE: NO GROWTH
Culture: NO GROWTH

## 2014-01-23 LAB — CBC
HCT: 29.5 % — ABNORMAL LOW (ref 39.0–52.0)
HEMATOCRIT: 28.9 % — AB (ref 39.0–52.0)
Hemoglobin: 10.2 g/dL — ABNORMAL LOW (ref 13.0–17.0)
Hemoglobin: 9.7 g/dL — ABNORMAL LOW (ref 13.0–17.0)
MCH: 30.4 pg (ref 26.0–34.0)
MCH: 29.9 pg (ref 26.0–34.0)
MCHC: 34.6 g/dL (ref 30.0–36.0)
MCHC: 33.6 g/dL (ref 30.0–36.0)
MCV: 88.1 fL (ref 78.0–100.0)
MCV: 89.2 fL (ref 78.0–100.0)
PLATELETS: 27 10*3/uL — AB (ref 150–400)
PLATELETS: 21 10*3/uL — AB (ref 150–400)
RBC: 3.35 MIL/uL — AB (ref 4.22–5.81)
RBC: 3.24 MIL/uL — ABNORMAL LOW (ref 4.22–5.81)
RDW: 16.9 % — ABNORMAL HIGH (ref 11.5–15.5)
RDW: 17.1 % — AB (ref 11.5–15.5)
WBC: 20.3 10*3/uL — ABNORMAL HIGH (ref 4.0–10.5)
WBC: 23.2 10*3/uL — AB (ref 4.0–10.5)

## 2014-01-23 LAB — RENAL FUNCTION PANEL
ALBUMIN: 1.8 g/dL — AB (ref 3.5–5.2)
Albumin: 1.8 g/dL — ABNORMAL LOW (ref 3.5–5.2)
BUN: 9 mg/dL (ref 6–23)
BUN: 10 mg/dL (ref 6–23)
CHLORIDE: 103 meq/L (ref 96–112)
CHLORIDE: 101 meq/L (ref 96–112)
CO2: 23 meq/L (ref 19–32)
CO2: 25 mEq/L (ref 19–32)
Calcium: 7.6 mg/dL — ABNORMAL LOW (ref 8.4–10.5)
Calcium: 7.3 mg/dL — ABNORMAL LOW (ref 8.4–10.5)
Creatinine, Ser: 0.47 mg/dL — ABNORMAL LOW (ref 0.50–1.35)
Creatinine, Ser: 0.5 mg/dL (ref 0.50–1.35)
GFR calc Af Amer: >90 mL/min (ref >90)
GFR calc non Af Amer: >90 mL/min (ref >90)
GFR calc non Af Amer: >90 mL/min (ref >90)
GLUCOSE: 130 mg/dL — AB (ref 70–99)
Glucose, Bld: 179 mg/dL — ABNORMAL HIGH (ref 70–99)
POTASSIUM: 4.9 meq/L (ref 3.7–5.3)
Phosphorus: 2.9 mg/dL (ref 2.3–4.6)
Phosphorus: 2.3 mg/dL (ref 2.3–4.6)
Potassium: 4.9 mEq/L (ref 3.7–5.3)
SODIUM: 134 meq/L — AB (ref 137–147)
Sodium: 134 mEq/L — ABNORMAL LOW (ref 137–147)

## 2014-01-23 LAB — PREPARE PLATELET PHERESIS
Unit division: 0
Unit division: 0

## 2014-01-23 LAB — GLUCOSE, CAPILLARY
GLUCOSE-CAPILLARY: 168 mg/dL — AB (ref 70–99)
GLUCOSE-CAPILLARY: 124 mg/dL — AB (ref 70–99)
GLUCOSE-CAPILLARY: 112 mg/dL — AB (ref 70–99)
GLUCOSE-CAPILLARY: 136 mg/dL — AB (ref 70–99)
Glucose-Capillary: 133 mg/dL — ABNORMAL HIGH (ref 70–99)
Glucose-Capillary: 200 mg/dL — ABNORMAL HIGH (ref 70–99)

## 2014-01-23 LAB — MAGNESIUM: Magnesium: 2.1 mg/dL (ref 1.5–2.5)

## 2014-01-23 SURGERY — INSERTION OF DIALYSIS CATHETER
Anesthesia: General

## 2014-01-23 MED ORDER — M.V.I. ADULT IV INJ
INTRAVENOUS | Status: AC
  Administered 2014-01-23: 18:00:00 via INTRAVENOUS
  Filled 2014-01-23: qty 2000

## 2014-01-23 MED ORDER — FAT EMULSION 20 % IV EMUL
250.0000 mL | INTRAVENOUS | Status: AC
  Administered 2014-01-23: 250 mL via INTRAVENOUS
  Filled 2014-01-23: qty 250

## 2014-01-23 MED ORDER — WHITE PETROLATUM GEL
Status: AC
  Administered 2014-01-23: 17:00:00
  Filled 2014-01-23: qty 5

## 2014-01-23 SURGICAL SUPPLY — 37 items
BAG DECANTER FOR FLEXI CONT (MISCELLANEOUS) ×3 IMPLANT
CATH CANNON HEMO 15F 50CM (CATHETERS) IMPLANT
CATH CANNON HEMO 15FR 19 (HEMODIALYSIS SUPPLIES) IMPLANT
CATH CANNON HEMO 15FR 23CM (HEMODIALYSIS SUPPLIES) IMPLANT
CATH CANNON HEMO 15FR 31CM (HEMODIALYSIS SUPPLIES) IMPLANT
CATH CANNON HEMO 15FR 32CM (HEMODIALYSIS SUPPLIES) IMPLANT
COVER PROBE W GEL 5X96 (DRAPES) IMPLANT
COVER SURGICAL LIGHT HANDLE (MISCELLANEOUS) ×3 IMPLANT
DECANTER SPIKE VIAL GLASS SM (MISCELLANEOUS) ×3 IMPLANT
DRAPE C-ARM 42X72 X-RAY (DRAPES) ×3 IMPLANT
DRAPE CHEST BREAST 15X10 FENES (DRAPES) ×3 IMPLANT
GAUZE SPONGE 2X2 8PLY STRL LF (GAUZE/BANDAGES/DRESSINGS) ×1 IMPLANT
GAUZE SPONGE 4X4 16PLY XRAY LF (GAUZE/BANDAGES/DRESSINGS) ×3 IMPLANT
GLOVE SS BIOGEL STRL SZ 7 (GLOVE) ×1 IMPLANT
GLOVE SUPERSENSE BIOGEL SZ 7 (GLOVE) ×2
GOWN STRL REUS W/ TWL LRG LVL3 (GOWN DISPOSABLE) ×3 IMPLANT
GOWN STRL REUS W/TWL LRG LVL3 (GOWN DISPOSABLE) ×6
KIT BASIN OR (CUSTOM PROCEDURE TRAY) ×3 IMPLANT
KIT ROOM TURNOVER OR (KITS) ×3 IMPLANT
NEEDLE 18GX1X1/2 (RX/OR ONLY) (NEEDLE) ×3 IMPLANT
NEEDLE 22X1 1/2 (OR ONLY) (NEEDLE) ×3 IMPLANT
NEEDLE HYPO 25GX1X1/2 BEV (NEEDLE) ×3 IMPLANT
NS IRRIG 1000ML POUR BTL (IV SOLUTION) ×3 IMPLANT
PACK SURGICAL SETUP 50X90 (CUSTOM PROCEDURE TRAY) ×3 IMPLANT
PAD ARMBOARD 7.5X6 YLW CONV (MISCELLANEOUS) ×6 IMPLANT
SOAP 2 % CHG 4 OZ (WOUND CARE) ×3 IMPLANT
SPONGE GAUZE 2X2 STER 10/PKG (GAUZE/BANDAGES/DRESSINGS) ×2
SUT ETHILON 3 0 PS 1 (SUTURE) ×3 IMPLANT
SUT VICRYL 4-0 PS2 18IN ABS (SUTURE) ×3 IMPLANT
SYR 20CC LL (SYRINGE) ×3 IMPLANT
SYR 30ML LL (SYRINGE) IMPLANT
SYR 5ML LL (SYRINGE) ×6 IMPLANT
SYR CONTROL 10ML LL (SYRINGE) ×3 IMPLANT
SYRINGE 10CC LL (SYRINGE) ×3 IMPLANT
TOWEL OR 17X24 6PK STRL BLUE (TOWEL DISPOSABLE) ×3 IMPLANT
TOWEL OR 17X26 10 PK STRL BLUE (TOWEL DISPOSABLE) ×3 IMPLANT
WATER STERILE IRR 1000ML POUR (IV SOLUTION) ×3 IMPLANT

## 2014-01-23 NOTE — Progress Notes (Signed)
ANTIBIOTIC / MEDICATION CONSULT NOTE - FOLLOW UP  Pharmacy Consult:  Merrem + Dilantin Indication:  Sepsis + Seizure  Allergies  Allergen Reactions  . Fexofenadine Hcl Other (See Comments)    unknown  . Percocet [Oxycodone-Acetaminophen] Other (See Comments)    unknown  . Thorazine [Chlorpromazine Hcl] Other (See Comments)    unknown    Patient Measurements: Height: 6\' 2"  (188 cm) Weight: 144 lb 2.9 oz (65.4 kg) IBW/kg (Calculated) : 82.2  Vital Signs: Temp: 96.4 F (35.8 C) (04/12 0841) Temp src: Axillary (04/12 0841) BP: 93/61 mmHg (04/12 0700) Pulse Rate: 99 (04/12 0700) Intake/Output from previous day: 04/11 0701 - 04/12 0700 In: 3675.3 [I.V.:925.3; IV Piggyback:910; TPN:1750] Out: 5427 [Drains:4900] Intake/Output from this shift: Total I/O In: -  Out: -2   Labs:  Recent Labs  01/22/14 0400 01/22/14 1600 01/22/14 1635 01/30/2014 0415  WBC 19.6*  --  17.9* 20.3*  HGB 10.0*  --  9.0* 10.2*  PLT 44*  --  34* 27*  CREATININE 0.56 0.41*  --  0.47*   Estimated Creatinine Clearance: 110.1 ml/min (by C-G formula based on Cr of 0.47).  Recent Labs  01/20/14 1000  VANCORANDOM 12.9     Microbiology: Recent Results (from the past 720 hour(s))  CULTURE, BLOOD (ROUTINE X 2)     Status: None   Collection Time    01/30/2014 11:58 PM      Result Value Ref Range Status   Specimen Description BLOOD WRIST RIGHT   Final   Special Requests BOTTLES DRAWN AEROBIC AND ANAEROBIC 5CC EA   Final   Culture  Setup Time     Final   Value: 01/09/2014 03:47     Performed at [facility]   Culture     Final   Value: NO GROWTH 5 DAYS     Performed at [facility]   Report Status 01/15/2014 FINAL   Final  CULTURE, BLOOD (ROUTINE X 2)     Status: None   Collection Time    01/09/14 12:24 AM      Result Value Ref Range Status   Specimen Description BLOOD LEFT ARM   Final   Special Requests BOTTLES DRAWN AEROBIC ONLY 10CC   Final   Culture  Setup Time     Final   Value: 01/09/2014 03:47     Performed at [facility]   Culture     Final   Value: NO GROWTH 5 DAYS     Performed at [facility]   Report Status 01/15/2014 FINAL   Final  URINE CULTURE     Status: None   Collection Time    01/09/14  2:42 AM      Result Value Ref Range Status   Specimen Description URINE, CATHETERIZED   Final   Special Requests ADD [id number]   Final   Culture  Setup Time     Final   Value: 01/09/2014 14:10     Performed at [place]     Final   Value: NO GROWTH     Performed at [facility]   Culture     Final   Value: NO GROWTH     Performed at [facility]   Report Status 01/10/2014 FINAL   Final  MRSA PCR SCREENING     Status: Abnormal   Collection Time    01/09/14  4:50 AM      Result Value Ref  Range Status   MRSA by PCR POSITIVE (*) NEGATIVE Final   Comment:            The GeneXpert MRSA Assay (FDA     approved for NASAL specimens     only), is one component of a     comprehensive MRSA colonization     surveillance program. It is not     intended to diagnose MRSA     infection nor to guide or     monitor treatment for     MRSA infections.     RESULT CALLED TO, READ BACK BY AND VERIFIED WITH:     MACK,B RN [phone] 01/09/14 MITCHELL,L  CULTURE, RESPIRATORY (NON-EXPECTORATED)     Status: None   Collection Time    01/11/14 11:53 AM      Result Value Ref Range Status   Specimen Description TRACHEAL ASPIRATE   Final   Special Requests NONE   Final   Gram Stain     Final   Value: NO WBC SEEN     NO SQUAMOUS EPITHELIAL CELLS SEEN     NO ORGANISMS SEEN     Performed at [facility]   Culture     Final   Value: NO GROWTH 2 DAYS     Performed at [facility]   Report Status 01/13/2014 FINAL   Final  CLOSTRIDIUM DIFFICILE BY PCR     Status: None   Collection Time    01/14/14  2:17 PM      Result Value Ref Range Status   C difficile by pcr NEGATIVE  NEGATIVE Final  CULTURE,  BLOOD (ROUTINE X 2)     Status: None   Collection Time    01/16/14  8:53 PM      Result Value Ref Range Status   Specimen Description BLOOD RIGHT HAND   Final   Special Requests BOTTLES DRAWN AEROBIC ONLY 5CC   Final   Culture  Setup Time     Final   Value: 01/17/2014 02:22     Performed at [facility]   Culture     Final   Value:        BLOOD CULTURE RECEIVED NO GROWTH TO DATE CULTURE WILL BE HELD FOR 5 DAYS BEFORE ISSUING A FINAL NEGATIVE REPORT     Performed at [facility]   Report Status PENDING   Incomplete  CULTURE, BLOOD (ROUTINE X 2)     Status: None   Collection Time    01/16/14  9:00 PM      Result Value Ref Range Status   Specimen Description BLOOD RIGHT HAND   Final   Special Requests BOTTLES DRAWN AEROBIC ONLY 3CC   Final   Culture  Setup Time     Final   Value: 01/17/2014 02:22     Performed at [facility]   Culture     Final   Value:        BLOOD CULTURE RECEIVED NO GROWTH TO DATE CULTURE WILL BE HELD FOR 5 DAYS BEFORE ISSUING A FINAL NEGATIVE REPORT     Performed at [facility]   Report Status PENDING   Incomplete  URINE CULTURE     Status: None   Collection Time    01/16/14  9:50 PM      Result Value Ref Range Status   Specimen Description URINE, RANDOM   Final   Special Requests NONE   Final   Culture  Setup Time     Final   Value: 01/16/2014 22:19     Performed at [facility]     Final   Value: NO GROWTH     Performed at [facility]   Culture     Final   Value: NO GROWTH     Performed at [facility]   Report Status 01/17/2014 FINAL   Final  CLOSTRIDIUM DIFFICILE BY PCR     Status: None   Collection Time    01/16/14  9:57 PM      Result Value Ref Range Status   C difficile by pcr NEGATIVE  NEGATIVE Final  CULTURE, RESPIRATORY (NON-EXPECTORATED)     Status: None   Collection Time    01/16/14  9:59 PM      Result Value Ref Range Status   Specimen Description TRACHEAL  ASPIRATE   Final   Special Requests NONE   Final   Gram Stain     Final   Value: FEW WBC PRESENT,BOTH PMN AND MONONUCLEAR     NO SQUAMOUS EPITHELIAL CELLS SEEN     ABUNDANT YEAST     Performed at [facility]   Culture     Final   Value: MODERATE CANDIDA ALBICANS     Performed at [facility]   Report Status 02/06/2014 FINAL   Final      Assessment: 72 YOM with severe sepsis and HCAP to continue on Merrem.  Patient is tolerating CRRT with minimal interruptions.  No UOP.  Vanc 3/29 >> 4/2, resumed 4/6 >> 4/11 Merrem 4/6 >> Zosyn 3/29 >> 4/6 Flagyl 4/5 >> 4/7 PO Vanc 4/6 >> 4/7 Mycamine 4/5 >> 4/8  4/2 VT = 42.2 mcg/mL 4/5 VR = 24.1 mcg/mL 4/6 VR = 14.6 mcg/mL 4/9 VR = 12.9 mcg/mL  4/5 TA - C.albicans 4/5 BCx x2- NGTD 4/5 UCx - negative 4/5 C.diff - negative 3/28 BCx x2 - negative   Pharmacy also managing Dilantin for seizure.  No new seizure reported.  3/29 level 7.9 >> corr 12.7 4/1 level 12.8 >> corr 28.87 (no reported nystagmus)  4/2 level 9.3 >> corr 21.1 4/3 level 8.5 >> corr 21.2  4/5 level 5.3 >> corr 15.5 with new alb 1.2 >> currently clearing 3mg /hr  4/7 level 5.3 >> corr 15.5 (doesn't seem to be clearing)  4/8 level 4.0 >> corr 10   Goal of Therapy:  DPH level:  10-20 mcg/mL Clearance of infection   Plan:  - Continue Merrem 1gm IV Q12H - Continue DPH 50mg  IV Q12H, check level in AM - F/U HIT panel, plts and the need to start [place] D. Mina Marble, PharmD, BCPS Pager:  [phone] 01/30/2014, 10:14 AM

## 2014-01-23 NOTE — Progress Notes (Signed)
PULMONARY / CRITICAL CARE MEDICINE   Name: [name] [name] MRN: [id number] DOB: January 18, 1971    ADMISSION DATE:  01/11/2014  PRIMARY SERVICE: PCCM  CHIEF COMPLAINT:  SOB, leg pain.  BRIEF PATIENT DESCRIPTION:  43 years old male resident of an assisted living facility with PMH relevant for schizoaffective disorder, polysubstance abuse, seizures, COPD,  malnutrition, GERD. Presented with SOB and found to be hypotensive despite 2 L of IVF's. Started on dopamine via peripheral IV by ED and  PCCM called to admit. No clear source of infection.   SIGNIFICANT EVENTS / STUDIES:  3/29 Admitted to PCCM with septic shock and HCAP. Resuscitated with IVFs, vasopressors. Intubated shortly after admission. Bicarbonate infusion initiated for severe metabolic acidosis 5/18 Persistent septic shock. Worsening renal function. Acidosis improved. HCO3 stopped 3/31 Off vasopressors. Severe agitation @ times. Fentanyl and dex infusions initiated. Not able to F/C. Oliguric with worsening renal indices. Foley cath changed out without improvement in Uo. No indication for HD at this time 4/1 Close to extubation, urine output slightly improved, will need to contact wife for discussion of goals of care 4/2 Still agitated, self-extubated, re-intubated successfully. Continued worsening renal function. No HCPA or family/significant other contact. Will consult with ethics committee to resume responsibility for [facility] decisions.  4/3 Renal function no worse. Hepatic function improving.  4/4 Started phenylephrine  4/5 Continued hypotension and oliguria. Unequal pupils. Severe metabolic acidosis, started on bicarb drip and CRRT. Started levophed. Started stress-steroids. Panculture for source of infection (likely abdominal). 4/6  Slowly improving acidosis on CRRT.  4/7 Improving acidosis, CT head w/o acute abnormality 4/6 CT abdo/pelvis distended and fluid-filled small bowel and colon  4/6 CT chest  large bilateral pleural effusions  with overlying atelectasis 4/7 Ischemic small bowel resection in OR. Transfused with ffp, vit K, and pRBCs  4/8 Bleeding with thrombocytopenia. Transfused with platelets and pRBC. Bedside wound vac change 4/9 TPN started 4/10 To go to OR for VAC change and  possible bowel anastomosis. Also possible tracheostomy in OR. 4/10 Lt femoral vein DVT  04/11 infrarenal IVC filter placed via right CFV  LINES / TUBES: L IJ CVL 3/29 >>  ETT 3/29 >> 4/2, 4/2 (self extubated and re-intubated) >> 4/10 4/10 trach  (OR ) >>  HD RIJ catheter 4/5 >> A-line 4/5 >> Foley   CULTURES: MRSA PCR 3/29 >> POS Urine 3/29 >> NG Blood 3/28 >> NG Blood 3/29 >> NG Blood 4/5 >>NGTD Urine 4/5 >>NG Tracheal 3/31 >>  NG C diff 4/3 >> Neg C diff 4/5 >> Neg Gi Pathogen 4/5>> Neg  Tracheal 4/5 >>moderate Candida Albicans  ANTIBIOTICS: IV Vanc 3/29 >> 4/2; 4/6 >> 04/10 Pip-tazo 3/29 >> 4/6 IV flagyl 4/5 >>4/7 PO vanc 4/6 >>4/7 IV micafungin 4/5 >>4/8 Meropenem 4/7 >>  SUBJECTIVE:  No acute events overnight.   Trached on vent.  VITAL SIGNS: Temp:  [90.9 F (32.7 C)-94.5 F (34.7 C)] 94.5 F (34.7 C) (04/12 0415) Pulse Rate:  [76-102] 99 (04/12 0700) Resp:  [11-20] 17 (04/12 0700) BP: (83-131)/(48-93) 93/61 mmHg (04/12 0700) SpO2:  [91 %-100 %] 97 % (04/12 0700) Arterial Line BP: (72-143)/(40-73) 96/46 mmHg (04/12 0700) FiO2 (%):  [40 %] 40 % (04/12 0700) Weight:  [65.4 kg (144 lb 2.9 oz)] 65.4 kg (144 lb 2.9 oz) (04/12 0400)  HEMODYNAMICS: CVP:  [13 mmHg] 13 mmHg  VENTILATOR SETTINGS: Vent Mode:  [-] PRVC FiO2 (%):  [40 %] 40 % Set Rate:  [16 bmp] 16 bmp Vt Set:  [  570 mL] 570 mL PEEP:  [8 cmH20] 8 cmH20 Plateau Pressure:  [20 cmH20-29 cmH20] 23 cmH20  INTAKE / OUTPUT: Intake/Output     04/11 0701 - 04/12 0700 04/12 0701 - 04/13 0700   I.V. (mL/kg) 925.3 (14.1)    Blood     Other 90    IV Piggyback 910    TPN 1750    Total Intake(mL/kg) 3675.3 (56.2)    Emesis/NG output     Drains  4900    Other 42    Total Output 4942     Net -1266.8            PHYSICAL EXAMINATION: General: critically ill, sedated  HEENT: trach in place Cardiac: tachycardic Lungs: Clear to ausculation in anterior fields   Abdomen: firm, no BS, wound vac in place  Ext: SCD's in place, cool, +3-4 edema  Neuro: sedate on vent, occasionally moves arms  PULMONARY  Recent Labs Lab 01/12/2014 1627 01/20/14 0902 01/29/2014 1114 01/22/14 0430 01/22/14 1806  PHART 7.479* 7.530* 7.422 7.329* 7.345*  PCO2ART 34.0* 28.1* 34.1* 45.6* 40.7  PO2ART 346.0* 46.0* 52.0* 76.0* 54.0*  HCO3 25.3* 24.0 22.4 24.9* 23.5  TCO2 26 25 23 26 25   O2SAT 100.0 91.0 88.0 96.0 92.0    CBC  Recent Labs Lab 01/22/14 0400 01/22/14 1635 01/12/2014 0415  HGB 10.0* 9.0* 10.2*  HCT 28.7* 25.1* 29.5*  WBC 19.6* 17.9* 20.3*  PLT 44* 34* 27*    COAGULATION  Recent Labs Lab 02/08/2014 1330 02/06/2014 0430 01/20/14 1700 02/05/2014 0500 01/22/14 0400  INR 1.91* 1.92* 1.63* 1.42 1.25     CHEMISTRY  Recent Labs Lab 01/20/14 0433  01/20/14 1700 01/29/2014 0500 02/10/2014 1600 01/22/14 0400 01/22/14 1600 02/08/2014 0415  NA 138  < >  --  135* 134* 136* 141 134*  K 3.6*  < >  --  4.3 4.4 4.0 2.9* 4.9  CL 102  < >  --  100 99 102 112 103  CO2 24  < >  --  22 23 23  16* 23  GLUCOSE 86  < >  --  179* 151* 173* 89 179*  BUN 4*  < >  --  4* 8 7 7 9   CREATININE 0.50  < >  --  0.45* 0.76 0.56 0.41* 0.47*  CALCIUM 7.3*  < >  --  7.5* 7.6* 7.3* 5.3* 7.6*  MG 1.9  --  2.0 2.1  --  2.0  --  2.1  PHOS 0.8*  < > 1.1* 1.9* 5.1* 2.4 1.9* 2.9  < > = values in this interval not displayed. Estimated Creatinine Clearance: 110.1 ml/min (by C-G formula based on Cr of 0.47).   LIVER  Recent Labs Lab 01/14/2014 0430 02/04/2014 1330  02/06/2014 0430  01/20/14 0433  01/20/14 1700 01/25/2014 0500 02/02/2014 1600 01/22/14 0400 01/22/14 1600 01/27/2014 0415  AST 351*  --   --  171*  --  124*  --   --  90*  --  60*  --   --   ALT 495*   --   --  252*  --  155*  --   --  136*  --  88*  --   --   ALKPHOS 138*  --   --  111  --  164*  --   --  151*  --  126*  --   --   BILITOT 1.8*  --   --  4.5*  --  7.1*  --   --  5.8*  --  5.0*  --   --   PROT 4.3*  --   --  4.1*  --  4.4*  --   --  4.5*  --  4.6*  --   --   ALBUMIN 1.2*  --   < > 1.6*  1.5*  < > 1.9*  < >  --  1.9* 2.3* 2.1* 1.4* 1.8*  INR 2.28* 1.91*  --  1.92*  --   --   --  1.63* 1.42  --  1.25  --   --   < > = values in this interval not displayed.   INFECTIOUS  Recent Labs Lab 01/17/14 0447  01/30/2014 1430 01/20/14 0500 01/22/14 0400  LATICACIDVEN  --   < > 3.0* 2.7* 2.3*  PROCALCITON 2.66  --   --   --   --   < > = values in this interval not displayed.   ENDOCRINE CBG (last 3)   Recent Labs  01/22/14 1931 01/27/2014 0005 01/20/2014 0406  GLUCAP 145* 200* 168*     IMAGING x48h  US Scrotum  01/22/2014   CLINICAL DATA:  Swelling.  EXAM: ULTRASOUND OF SCROTUM  TECHNIQUE: Complete ultrasound examination of the testicles, epididymis, and other scrotal structures was performed.  COMPARISON:  CT chest abdomen and pelvis 01/17/2014  FINDINGS: Right testicle  Measurements: 3 x 2.2 x 1.9 cm. Heterogeneous parenchymal appearance throughout the testis with geographic areas of low attenuation change. No circumscribed a mass or contour deformity is identified. Normal testicular flow.  Left testicle  Measurements: 3.1 x 2 x 2.4 cm. Heterogeneous parenchymal appearance throughout the testis with geographic areas of low attenuation change. No circumscribed mass or contour deformity is identified. Normal testicular flow.  Right epididymis:  Normal in size and appearance.  Left epididymis:  Normal in size and appearance.  Hydrocele:  Small bilateral hydroceles.  Varicocele:  None visualized.  There is prominent diffuse soft tissue edema throughout the scrotal sac. No loculated fluid collection or abscess is identified.  IMPRESSION: Diffuse scrotal edema/ swelling. No focal  abscess. Small bilateral hydroceles. Heterogeneous appearance of the testes bilaterally without focal contour deformity. Appearance is indeterminate and could represent inflammatory or infiltrative process. Lymphoma or infiltrating neoplasm is not excluded.   Electronically Signed   By: Lucienne Capers M.D.   On: 01/22/2014 02:34   Ir Ivc Filter Plmt / S&i /img Guid/mod Sed  01/22/2014   INDICATION: History of hospital acquired pneumonia complicated by severe sepsis, respiratory failure (post tracheostomy), and bowel ischemia (post partial small bowel resection), now with left lower extremity DVT  EXAM: ULTRASOUND GUIDANCE FOR VASCULAR ACCESS  IVC CATHETERIZATION AND VENOGRAM  IVC FILTER INSERTION  MEDICATIONS: None  ANESTHESIA/SEDATION: None - patient is currently in the ICU and sedated  CONTRAST:  65mL OMNIPAQUE IOHEXOL 300 MG/ML  SOLN  FLUOROSCOPY TIME:  1 minute  COMPLICATIONS: None immediate  PROCEDURE: An emergency consent was obtained. A time out was performed prior to the initiation of the procedure. Maximal barrier sterile technique utilized including caps, mask, sterile gowns, sterile gloves, large sterile drape, hand hygiene, and Betadine prep.  Under sterile condition and local anesthesia, right common femoral venous access was performed with ultrasound. An ultrasound image was saved and sent to PACS. Over a guidewire, the IVC filter delivery sheath and inner dilator were advanced into the IVC just above the IVC bifurcation. Contrast injection was performed for an IVC venogram.  Through the delivery sheath, a retrievable [facility]  IVC filter was deployed below the level of the renal veins and above the IVC bifurcation. Limited post deployment venacavagram was performed.  The delivery sheath was removed and hemostasis was obtained with manual compression. A dressing was placed. The patient tolerated the procedure well without immediate post procedural complication.  FINDINGS: The IVC is patent. No  evidence of thrombus, stenosis, or occlusion. No variant venous anatomy. Successful placement of the IVC filter below the level of the renal veins.  IMPRESSION: Successful ultrasound and fluoroscopically guided placement of an infrarenal retrievable IVC filter.   Electronically Signed   By: Sandi Mariscal M.D.   On: 01/22/2014 10:37   Dg Chest Port 1 View  01/22/2014   CLINICAL DATA:  Pneumonia  EXAM: PORTABLE CHEST - 1 VIEW  COMPARISON:  DG CHEST 1V PORT dated 02/02/2014  FINDINGS: The lungs are well-expanded. The tracheostomy appliance tip lies 6 cm above the crotch of the carina. There are moderately increased interstitial markings persistently in the mid and lower lung zones not greatly changed from the previous study. There are likely bullous lesions in the upper lobes. The cardiopericardial silhouette is not enlarged. The pulmonary vascularity is not engorged. There is no pneumothorax. There are small bilateral pleural effusions. The right internal jugular Cordis sheath tip lies in the region of the proximal SVC. The left internal jugular venous catheter tip lies in the region of the midportion of the SVC.  IMPRESSION: Allowing for differences in positioning there has not been significant interval change in the appearance of the chest since extubation of the trachea and esophagus.   Electronically Signed   By: David  Martin[place]   On: 01/22/2014 09:20     ASSESSMENT / PLAN:  PULMONARY A: Ventilator Dependent Respiratory Failure   -  ARDS Large bilateral effusions  HCAP     -bilateral lower lobes R>L COPD - emphysema P:   Continue ventilator support, consider SBT once abdomen closed Decrease PEEP to 5; goal SpO2 > 88% ARDS protocol Albuterol neb and duoneb PRN, limit when we can in ards   CARDIOVASCULAR A:  Septic shock on pressor support - - 2D- echo -nml LV fn Hypovolemia as well contributing from abdo output Adrenal Insufficency    - AM cortisol 14.4 P:  -continue Levophed to maintain  MAP > 60 mm Hg -continue IV hydrocortisone 50 mg q6h  -bolus and assess response -add maintenance volume to match output    RENAL A:   Acute kidney failure, oligo-anuric -  likely due to ATN (severe sepsis)     -Cr wnl, still oliguric on CRRT Anion Gap Metabolic Acidosis due to lactic acidosis - resolved  Hypokalemia - resolved Hypocalcemia  Hypophosphatemia P:   Renal following Continue CRRT - keep even Avoid nephrotoxins keep foley Attempt albumin Would delay permanent HD catheter placement at this time, pending resolution of sepsis   GASTROINTESTINAL A:   Ischemic Small Bowel s/p resection-wound vac Acute liver failure due to severe sepsis   - improving function Mild pancreatitis - resolved GERD  P:  Surgery following - no fascial dehiscence, continue wound vac (will change T,Th, Sat) NPO, bowel rest Fentanyl infusion pain Continue TPN started 4/9 Hold tube feedings osmolite and pro-stat initiated 3/31; GT to gravity   HEMATOLOGIC A:   Thrombocytopenia s/p transfusion   - likely due to liver dysfunction and severe sepsis, not DIC (factor 8 wnl) Acute on Chronic Normocytic Anemia s/p transfusion     -Hgb stable  Coagulapathy    -  INR and PTT normalizing    - LLE DVT s/p IVC filter placement P:  Hold heparin, F/U HIT panel (pending) Transfuse platelets, as needed  Monitor CBC Maintain SCD or right; no SCD on left given LLE DVT IVC filter placed 01/22/14  Restart heparin if HIT neg   INFECTIOUS A:   Severe sepsis  Leukocytosis  - afebrile (though some hypothermia with CRRT), WBC was improving, now back up  RLL HCAP, NOS HIV neg P:   Repeat blood, urine and resp cultures Cont meropenem LIJ CVC placed 03/29 will need to be pulled in next day or two, especially if WBC continues to trend up   ENDOCRINE A:   Adrenal Insufficiency   -AM cortisol 14 Hypothyroidism   - TSH wnl Hypo/hyper glycemia P:   Cont L-thyroxine  SSI with CBG Q 4 hr IV  hydrocortisone 50 mg Q 6 hr started 4/9 F/U ACTH level   NEUROLOGIC/PSYCHOSOCIAL A:   Anisocoria at baseline? Acute encephalopathy with severe agitation Chronic b/l Cerebellar Encephalomalacia  Schizoaffective disorder Seizure disorder No HCPOA identified P:   Fentanyl infusion and PRN pain; limit fentanyl gtt if possible Hold home Remeron, Zyprexa, Cogentin  IV phenytoin per pharmacy WUA daily if able to tolerate Continue soft restraints  goal dc versed, pending belly   Duwaine Maxin, DO IMTS, PGY1 01/20/2014 7:19 AM   Care during the described time interval was provided by me and/or other providers on the critical care team.  I have reviewed this patient's available data, including medical history, events of note, physical examination and test results as part of my evaluation  CC time x 40 m  [facility] Pager [phone] If no response call [phone]

## 2014-01-23 NOTE — Progress Notes (Signed)
Tracheal aspirate obtained/sent to lab. 

## 2014-01-23 NOTE — Progress Notes (Signed)
Patient ID: [name] [name], male   DOB: [birth year], 43 y.o.   MRN: [id number] 2 Days Post-Op  Subjective: Pt trached on vent.  Still on some pressors  Objective: Vital signs in last 24 hours: Temp:  [90.9 F (32.7 C)-96.4 F (35.8 C)] 96.4 F (35.8 C) (04/12 0841) Pulse Rate:  [76-102] 99 (04/12 0700) Resp:  [11-20] 17 (04/12 0700) BP: (83-131)/(48-93) 93/61 mmHg (04/12 0700) SpO2:  [91 %-100 %] 100 % (04/12 0813) Arterial Line BP: (72-143)/(40-73) 96/46 mmHg (04/12 0700) FiO2 (%):  [40 %] 40 % (04/12 0813) Weight:  [144 lb 2.9 oz (65.4 kg)] 144 lb 2.9 oz (65.4 kg) (04/12 0400) Last BM Date: 02/05/2014  Intake/Output from previous day: 04/11 0701 - 04/12 0700 In: 3675.3 [I.V.:925.3; IV Piggyback:910; TPN:1750] Out: 1937 [Drains:4900] Intake/Output this shift:    PE: Abd: soft, absent BS, VAC removed (3L out yesterday)  No evidence of dehiscence or evisceration.  Peritoneal fluid is able to be seen coming up through the wound.  VAC replaced to control drainage.  Otherwise wound is clean.  There is some darkness noted of his muscles.  g-tube in place with some bilious output  Lab Results:   Recent Labs  01/22/14 1635 02/02/2014 0415  WBC 17.9* 20.3*  HGB 9.0* 10.2*  HCT 25.1* 29.5*  PLT 34* 27*   BMET  Recent Labs  01/22/14 1600 01/15/2014 0415  NA 141 134*  K 2.9* 4.9  CL 112 103  CO2 16* 23  GLUCOSE 89 179*  BUN 7 9  CREATININE 0.41* 0.47*  CALCIUM 5.3* 7.6*   PT/INR  Recent Labs  01/25/2014 0500 01/22/14 0400  LABPROT 17.0* 15.4*  INR 1.42 1.25   CMP     Component Value Date/Time   NA 134* 02/10/2014 0415   K 4.9 02/02/2014 0415   CL 103 02/06/2014 0415   CO2 23 01/14/2014 0415   GLUCOSE 179* 01/14/2014 0415   BUN 9 02/08/2014 0415   CREATININE 0.47* 02/10/2014 0415   CALCIUM 7.6* 02/06/2014 0415   PROT 4.6* 01/22/2014 0400   ALBUMIN 1.8* 01/13/2014 0415   AST 60* 01/22/2014 0400   ALT 88* 01/22/2014 0400   ALKPHOS 126* 01/22/2014 0400   BILITOT 5.0*  01/22/2014 0400   GFRNONAA >90 02/05/2014 0415   GFRAA >90 02/05/2014 0415   Lipase     Component Value Date/Time   LIPASE 34 02/08/2014 0430       Studies/Results: US Scrotum  01/22/2014   CLINICAL DATA:  Swelling.  EXAM: ULTRASOUND OF SCROTUM  TECHNIQUE: Complete ultrasound examination of the testicles, epididymis, and other scrotal structures was performed.  COMPARISON:  CT chest abdomen and pelvis 01/17/2014  FINDINGS: Right testicle  Measurements: 3 x 2.2 x 1.9 cm. Heterogeneous parenchymal appearance throughout the testis with geographic areas of low attenuation change. No circumscribed a mass or contour deformity is identified. Normal testicular flow.  Left testicle  Measurements: 3.1 x 2 x 2.4 cm. Heterogeneous parenchymal appearance throughout the testis with geographic areas of low attenuation change. No circumscribed mass or contour deformity is identified. Normal testicular flow.  Right epididymis:  Normal in size and appearance.  Left epididymis:  Normal in size and appearance.  Hydrocele:  Small bilateral hydroceles.  Varicocele:  None visualized.  There is prominent diffuse soft tissue edema throughout the scrotal sac. No loculated fluid collection or abscess is identified.  IMPRESSION: Diffuse scrotal edema/ swelling. No focal abscess. Small bilateral hydroceles. Heterogeneous appearance of the testes bilaterally without focal  contour deformity. Appearance is indeterminate and could represent inflammatory or infiltrative process. Lymphoma or infiltrating neoplasm is not excluded.   Electronically Signed   By: Lucienne Capers M.D.   On: 01/22/2014 02:34   Ir Ivc Filter Plmt / S&i /img Guid/mod Sed  01/22/2014   INDICATION: History of hospital acquired pneumonia complicated by severe sepsis, respiratory failure (post tracheostomy), and bowel ischemia (post partial small bowel resection), now with left lower extremity DVT  EXAM: ULTRASOUND GUIDANCE FOR VASCULAR ACCESS  IVC CATHETERIZATION  AND VENOGRAM  IVC FILTER INSERTION  MEDICATIONS: None  ANESTHESIA/SEDATION: None - patient is currently in the ICU and sedated  CONTRAST:  51mL OMNIPAQUE IOHEXOL 300 MG/ML  SOLN  FLUOROSCOPY TIME:  1 minute  COMPLICATIONS: None immediate  PROCEDURE: An emergency consent was obtained. A time out was performed prior to the initiation of the procedure. Maximal barrier sterile technique utilized including caps, mask, sterile gowns, sterile gloves, large sterile drape, hand hygiene, and Betadine prep.  Under sterile condition and local anesthesia, right common femoral venous access was performed with ultrasound. An ultrasound image was saved and sent to PACS. Over a guidewire, the IVC filter delivery sheath and inner dilator were advanced into the IVC just above the IVC bifurcation. Contrast injection was performed for an IVC venogram.  Through the delivery sheath, a retrievable Denali IVC filter was deployed below the level of the renal veins and above the IVC bifurcation. Limited post deployment venacavagram was performed.  The delivery sheath was removed and hemostasis was obtained with manual compression. A dressing was placed. The patient tolerated the procedure well without immediate post procedural complication.  FINDINGS: The IVC is patent. No evidence of thrombus, stenosis, or occlusion. No variant venous anatomy. Successful placement of the IVC filter below the level of the renal veins.  IMPRESSION: Successful ultrasound and fluoroscopically guided placement of an infrarenal retrievable IVC filter.   Electronically Signed   By: Sandi Mariscal M.D.   On: 01/22/2014 10:37   Dg Chest Port 1 View  01/30/2014   CLINICAL DATA:  Adult respiratory distress syndrome  EXAM: PORTABLE CHEST - 1 VIEW  COMPARISON:  01/22/2014, 01/17/2014, 01/20/2014 and 01/09/2014  FINDINGS: Right IJ sheath, left IJ central venous catheter, and tracheostomy remain in satisfactory position. Heart size is normal. Pulmonary vascularity appears  normal. There are emphysematous changes. Bilateral perihilar and bibasilar airspace opacities are without significant change compared to 01/22/2014. There posteriorly layering bilateral pleural effusions.  IMPRESSION: No significant change in aeration of the lungs compared to 01/22/2014. Airspace disease in the perihilar regions and lung bases and bilateral posteriorly layering pleural effusions persist.  Emphysema/ COPD.   Electronically Signed   By: Curlene Dolphin M.D.   On: 01/13/2014 08:55   Dg Chest Port 1 View  01/22/2014   CLINICAL DATA:  Pneumonia  EXAM: PORTABLE CHEST - 1 VIEW  COMPARISON:  DG CHEST 1V PORT dated 01/17/2014  FINDINGS: The lungs are well-expanded. The tracheostomy appliance tip lies 6 cm above the crotch of the carina. There are moderately increased interstitial markings persistently in the mid and lower lung zones not greatly changed from the previous study. There are likely bullous lesions in the upper lobes. The cardiopericardial silhouette is not enlarged. The pulmonary vascularity is not engorged. There is no pneumothorax. There are small bilateral pleural effusions. The right internal jugular Cordis sheath tip lies in the region of the proximal SVC. The left internal jugular venous catheter tip lies in the region of the  midportion of the SVC.  IMPRESSION: Allowing for differences in positioning there has not been significant interval change in the appearance of the chest since extubation of the trachea and esophagus.   Electronically Signed   By: David  [place]   On: 01/22/2014 09:20    Anti-infectives: Anti-infectives   Start     Dose/Rate Route Frequency Ordered Stop   01/20/14 1300  vancomycin (VANCOCIN) IVPB 1000 mg/200 mL premix  Status:  Discontinued     1,000 mg 200 mL/hr over 60 Minutes Intravenous Every 24 hours 01/20/14 1152 01/22/14 0932   01/14/2014 0000  meropenem (MERREM) 1 g in sodium chloride 0.9 % 100 mL IVPB     1 g 200 mL/hr over 30 Minutes Intravenous Every  12 hours 01/17/14 1405     01/17/14 1300  vancomycin (VANCOCIN) IVPB 750 mg/150 ml premix  Status:  Discontinued     750 mg 150 mL/hr over 60 Minutes Intravenous Every 24 hours 01/17/14 1211 01/20/14 1152   01/17/14 1000  meropenem (MERREM) 2 g in sodium chloride 0.9 % 100 mL IVPB  Status:  Discontinued     2 g 200 mL/hr over 30 Minutes Intravenous Every 18 hours 01/17/14 0913 01/17/14 1405   01/17/14 0000  vancomycin (VANCOCIN) 50 mg/mL oral solution 500 mg  Status:  Discontinued     500 mg Oral 4 times per day 01/16/14 2008 01/20/2014 0952   01/17/14 0000  piperacillin-tazobactam (ZOSYN) IVPB 3.375 g  Status:  Discontinued     3.375 g 100 mL/hr over 30 Minutes Intravenous 4 times per day 01/16/14 2202 01/17/14 0854   01/16/14 2200  metroNIDAZOLE (FLAGYL) tablet 500 mg  Status:  Discontinued     500 mg Oral 3 times per day 01/16/14 1905 01/16/14 2002   01/16/14 2015  metroNIDAZOLE (FLAGYL) IVPB 500 mg  Status:  Discontinued     500 mg 100 mL/hr over 60 Minutes Intravenous Every 8 hours 01/16/14 2002 01/27/2014 1001   01/16/14 2000  vancomycin (VANCOCIN) 50 mg/mL oral solution 250 mg  Status:  Discontinued     250 mg Oral 4 times per day 01/16/14 1957 01/16/14 2008   01/16/14 2000  micafungin (MYCAMINE) 100 mg in sodium chloride 0.9 % 100 mL IVPB  Status:  Discontinued     100 mg 100 mL/hr over 1 Hours Intravenous Daily 01/16/14 1957 01/17/2014 1117   01/16/14 1800  piperacillin-tazobactam (ZOSYN) IVPB 3.375 g  Status:  Discontinued     3.375 g 12.5 mL/hr over 240 Minutes Intravenous 4 times per day 01/16/14 1648 01/16/14 2202   01/12/14 1200  piperacillin-tazobactam (ZOSYN) IVPB 2.25 g  Status:  Discontinued     2.25 g 100 mL/hr over 30 Minutes Intravenous 4 times per day 01/12/14 0844 01/16/14 1648   01/12/14 0500  vancomycin (VANCOCIN) 500 mg in sodium chloride 0.9 % 100 mL IVPB  Status:  Discontinued     500 mg 100 mL/hr over 60 Minutes Intravenous Every 24 hours 01/11/14 0902 01/12/14  1242   01/10/14 0400  vancomycin (VANCOCIN) IVPB 750 mg/150 ml premix  Status:  Discontinued     750 mg 150 mL/hr over 60 Minutes Intravenous Every 24 hours 01/09/14 0353 01/11/14 0902   01/09/14 1200  piperacillin-tazobactam (ZOSYN) IVPB 3.375 g  Status:  Discontinued     3.375 g 12.5 mL/hr over 240 Minutes Intravenous Every 8 hours 01/09/14 0353 01/12/14 0844   01/09/14 0330  piperacillin-tazobactam (ZOSYN) IVPB 3.375 g     3.375  g 100 mL/hr over 30 Minutes Intravenous  Once 01/09/14 0329 01/09/14 0358   01/09/14 0330  vancomycin (VANCOCIN) IVPB 1000 mg/200 mL premix     1,000 mg 200 mL/hr over 60 Minutes Intravenous  Once 01/09/14 0329 01/09/14 0437       Assessment/Plan POD 5/2 for ex lap with SBR and anastomosis of small bowel  POD 2 s/p trach Post op ileus  Plan: 1. Cont g-tube to gravity.  His bowels have not awakened yet to where they are able to be fed enterally yet. 2. Cont VAC.  Will change T,TH, Sat.    LOS: 15 days    Henreitta Cea 01/15/2014, 9:09 AM Pager: [phone]

## 2014-01-23 NOTE — Progress Notes (Signed)
PARENTERAL NUTRITION CONSULT NOTE- Follow Up  Pharmacy Consult for TPN Indication: Massive Bowel Resection  Allergies  Allergen Reactions  . Fexofenadine Hcl Other (See Comments)    unknown  . Percocet [Oxycodone-Acetaminophen] Other (See Comments)    unknown  . Thorazine [Chlorpromazine Hcl] Other (See Comments)    unknown   Patient Measurements: Height: 6' 2"  (188 cm) Weight: 144 lb 2.9 oz (65.4 kg) IBW/kg (Calculated) : 82.2  Vital Signs: Temp: 94.5 F (34.7 C) (04/12 0415) Temp src: Oral (04/12 0415) BP: 93/61 mmHg (04/12 0700) Pulse Rate: 99 (04/12 0700) Intake/Output from previous day: 04/11 0701 - 04/12 0700 In: 3675.3 [I.V.:925.3; IV Piggyback:910; TPN:1750] Out: 7939 [Drains:4900] Intake/Output from this shift:    Labs:  Recent Labs  01/20/14 1700 02/09/2014 0500  01/22/14 0400 01/22/14 1635 01/16/2014 0415  WBC 19.3* 16.9*  < > 19.6* 17.9* 20.3*  HGB 11.3* 11.8*  < > 10.0* 9.0* 10.2*  HCT 30.7* 31.2*  < > 28.7* 25.1* 29.5*  PLT 47* 44*  < > 44* 34* 27*  APTT 40* 38*  --  41*  --   --   INR 1.63* 1.42  --  1.25  --   --   < > = values in this interval not displayed.   Recent Labs  01/14/2014 0500 02/10/2014 1600 01/22/14 0400 01/22/14 1600 02/02/2014 0415  NA 135* 134* 136* 141 134*  K 4.3 4.4 4.0 2.9* 4.9  CL 100 99 102 112 103  CO2 22 23 23  16* 23  GLUCOSE 179* 151* 173* 89 179*  BUN 4* 8 7 7 9   CREATININE 0.45* 0.76 0.56 0.41* 0.47*  CALCIUM 7.5* 7.6* 7.3* 5.3* 7.6*  MG 2.1  --  2.0  --  2.1  PHOS 1.9* 5.1* 2.4 1.9* 2.9  PROT 4.5*  --  4.6*  --   --   ALBUMIN 1.9* 2.3* 2.1* 1.4* 1.8*  AST 90*  --  60*  --   --   ALT 136*  --  88*  --   --   ALKPHOS 151*  --  126*  --   --   BILITOT 5.8*  --  5.0*  --   --   PREALBUMIN  --   --  8.7*  --   --   TRIG  --   --  184*  --   --    Estimated Creatinine Clearance: 110.1 ml/min (by C-G formula based on Cr of 0.47).    Recent Labs  01/22/14 1931 01/30/2014 0005 02/08/2014 0406  GLUCAP 145* 200*  168*   Insulin Requirements in the past 24 hours:  4 units SSI  Current Nutrition:  Clinimix E 5/15 to 75 mL/hr + lipid 20% emulsion at 10 mL/hr- this will provide 90g protein and 1758 kcal daily (100% of goal) Also on D5 at 68m/hr which provides an additional 96 kcal from dextrose  Nutritional Goals:  1700 kcal, 85-100g protein daily per RD recommendations 4/10   Assessment: 89[facility] was intubated shortly after admission. 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 Feeding supplements were stopped 4/8 and he is now NPO with bowel rest. He has a history of GERD with chronic PPI use. Currently with acute liver failure due to severe sepsis. S/p tracheostomy, partial small bowel resection and re-anastomosis with application of wound vac on 4/10.  Drain output 4/10 > ~3.8L. Per surgery, not ready to start tube feeds yet.   AC: New LE DVT on U/S. Cannot anticoagulate. S/p IVF filter placement on 4/11   Endo: CBGs 139-168 in last 24 hours. Patient was started on stress steroids yesterday and is now requiring some SSI (had not been previously)  Lytes: Na 134, K 4.9, Phos 2.9 (repleted on 4/10 with NaPhos 35mol by renal), Mg 2.1, CorrCa 9.36. Since patient was on tube feeds till 4/5, likely low risk for re-feeding syndrome.  Renal: SCr 0.47 (stable), On CVVHDF with BFR 259mmin- trying to keep even  Pulm: 97% on 40% FiO2, PEEP 8. S/p Trach on 4/10   Cards: BP 93/61, HR70-100s, On Levophed    Hepatobil: AST/ALT elevated but trending down- today 60/88, respectively. Alk Phos elevated at 126 (now trending down), TBilli down but still elevated at 5. TG elevated at 184   Neuro: hx schizoaffective disorder, seizures and polysubstance abuse. On fentanyl and versed.  ID: Severe sepsis, On D#7 of Merrem and Vancomycin. May consider dc vanc if no MRSA or eneterococcus. WBC 20.3,  HYPOthermic  Best Practices: SCDs, IV Pepcid (In TPN bag)   TPN Access: CVC Triple lumen 3/29 TPN day#: 3 (started 4/9)  Plan:  1. Continue Clinimix E 5/15 WITH LYTES at 75 mL/hr + lipid 20% emulsion at 10 mL/hr- this will provide 90g protein and 1758 kcal daily (100% of goal) 2. Multivitamin daily and trace elements MWF only due to shortage- with elevated TBili, will only provide HALF trace elements on MWF. 3. Continue SSI and CBGs 6 times daily 4. Due to shortage of IV Protonix, SUP changed to IV Pepcid in TPN. Will add 4051mo TPN bag but since patient will not receive entire bag at current rate, he will be receiving 58m45m Pepcid daily. 5. F/u AM labs   BenjAlbertina ParrarmD.  Clinical Pharmacist Pager [phone]

## 2014-01-23 NOTE — Progress Notes (Signed)
I have seen and examined the pt and agree with PA-Osborne's progress note. No fascial dehis Con't cound vac

## 2014-01-23 NOTE — Progress Notes (Signed)
Subjective: Interval History: none.  Objective: Vital signs in last 24 hours: Temp:  [90.9 F (32.7 C)-94.5 F (34.7 C)] 94.5 F (34.7 C) (04/12 0415) Pulse Rate:  [76-102] 99 (04/12 0700) Resp:  [11-20] 17 (04/12 0700) BP: (83-131)/(48-93) 93/61 mmHg (04/12 0700) SpO2:  [91 %-100 %] 97 % (04/12 0700) Arterial Line BP: (72-143)/(40-73) 96/46 mmHg (04/12 0700) FiO2 (%):  [40 %] 40 % (04/12 0700) Weight:  [65.4 kg (144 lb 2.9 oz)] 65.4 kg (144 lb 2.9 oz) (04/12 0400) Weight change: 2.7 kg (5 lb 15.2 oz)  Intake/Output from previous day: 04/11 0701 - 04/12 0700 In: 3675.3 [I.V.:925.3; IV Piggyback:910; TPN:1750] Out: 4942 [Drains:4900] Intake/Output this shift:    General appearance: pale and sedated, moves arms Resp: rales bibasilar and rhonchi bilaterally Cardio: S1, S2 normal GI: no bs, vac midline. firm Extremities: edema 3+  Lab Results:  Recent Labs  01/22/14 1635 02/06/2014 0415  WBC 17.9* 20.3*  HGB 9.0* 10.2*  HCT 25.1* 29.5*  PLT 34* 27*   BMET:  Recent Labs  01/22/14 1600 02/06/2014 0415  NA 141 134*  K 2.9* 4.9  CL 112 103  CO2 16* 23  GLUCOSE 89 179*  BUN 7 9  CREATININE 0.41* 0.47*  CALCIUM 5.3* 7.6*   No results found for this basename: PTH,  in the last 72 hours Iron Studies: No results found for this basename: IRON, TIBC, TRANSFERRIN, FERRITIN,  in the last 72 hours  Studies/Results: US Scrotum  01/22/2014   CLINICAL DATA:  Swelling.  EXAM: ULTRASOUND OF SCROTUM  TECHNIQUE: Complete ultrasound examination of the testicles, epididymis, and other scrotal structures was performed.  COMPARISON:  CT chest abdomen and pelvis 01/17/2014  FINDINGS: Right testicle  Measurements: 3 x 2.2 x 1.9 cm. Heterogeneous parenchymal appearance throughout the testis with geographic areas of low attenuation change. No circumscribed a mass or contour deformity is identified. Normal testicular flow.  Left testicle  Measurements: 3.1 x 2 x 2.4 cm. Heterogeneous  parenchymal appearance throughout the testis with geographic areas of low attenuation change. No circumscribed mass or contour deformity is identified. Normal testicular flow.  Right epididymis:  Normal in size and appearance.  Left epididymis:  Normal in size and appearance.  Hydrocele:  Small bilateral hydroceles.  Varicocele:  None visualized.  There is prominent diffuse soft tissue edema throughout the scrotal sac. No loculated fluid collection or abscess is identified.  IMPRESSION: Diffuse scrotal edema/ swelling. No focal abscess. Small bilateral hydroceles. Heterogeneous appearance of the testes bilaterally without focal contour deformity. Appearance is indeterminate and could represent inflammatory or infiltrative process. Lymphoma or infiltrating neoplasm is not excluded.   Electronically Signed   By: Lucienne Capers M.D.   On: 01/22/2014 02:34   Ir Ivc Filter Plmt / S&i /img Guid/mod Sed  01/22/2014   INDICATION: History of hospital acquired pneumonia complicated by severe sepsis, respiratory failure (post tracheostomy), and bowel ischemia (post partial small bowel resection), now with left lower extremity DVT  EXAM: ULTRASOUND GUIDANCE FOR VASCULAR ACCESS  IVC CATHETERIZATION AND VENOGRAM  IVC FILTER INSERTION  MEDICATIONS: None  ANESTHESIA/SEDATION: None - patient is currently in the ICU and sedated  CONTRAST:  27mL OMNIPAQUE IOHEXOL 300 MG/ML  SOLN  FLUOROSCOPY TIME:  1 minute  COMPLICATIONS: None immediate  PROCEDURE: An emergency consent was obtained. A time out was performed prior to the initiation of the procedure. Maximal barrier sterile technique utilized including caps, mask, sterile gowns, sterile gloves, large sterile drape, hand hygiene, and Betadine  prep.  Under sterile condition and local anesthesia, right common femoral venous access was performed with ultrasound. An ultrasound image was saved and sent to PACS. Over a guidewire, the IVC filter delivery sheath and inner dilator were  advanced into the IVC just above the IVC bifurcation. Contrast injection was performed for an IVC venogram.  Through the delivery sheath, a retrievable Denali IVC filter was deployed below the level of the renal veins and above the IVC bifurcation. Limited post deployment venacavagram was performed.  The delivery sheath was removed and hemostasis was obtained with manual compression. A dressing was placed. The patient tolerated the procedure well without immediate post procedural complication.  FINDINGS: The IVC is patent. No evidence of thrombus, stenosis, or occlusion. No variant venous anatomy. Successful placement of the IVC filter below the level of the renal veins.  IMPRESSION: Successful ultrasound and fluoroscopically guided placement of an infrarenal retrievable IVC filter.   Electronically Signed   By: Sandi Mariscal M.D.   On: 01/22/2014 10:37   Dg Chest Port 1 View  01/22/2014   CLINICAL DATA:  Pneumonia  EXAM: PORTABLE CHEST - 1 VIEW  COMPARISON:  DG CHEST 1V PORT dated 02/05/2014  FINDINGS: The lungs are well-expanded. The tracheostomy appliance tip lies 6 cm above the crotch of the carina. There are moderately increased interstitial markings persistently in the mid and lower lung zones not greatly changed from the previous study. There are likely bullous lesions in the upper lobes. The cardiopericardial silhouette is not enlarged. The pulmonary vascularity is not engorged. There is no pneumothorax. There are small bilateral pleural effusions. The right internal jugular Cordis sheath tip lies in the region of the proximal SVC. The left internal jugular venous catheter tip lies in the region of the midportion of the SVC.  IMPRESSION: Allowing for differences in positioning there has not been significant interval change in the appearance of the chest since extubation of the trachea and esophagus.   Electronically Signed   By: David  [place]   On: 01/22/2014 09:20    I have reviewed the patient's current  medications.  Assessment/Plan: 1 AKI  Oliguric ATN from sepsis.  Acid/base/K ok.  Incorrect e/lytes yest pm, looks like NS contam with all lytes.  Vol xs by exam and CXR.    Good solute clearance. 2 Anemia stable 3 Nutrition TPN 4 s/p resx ischemic bowel 5 VDRF per CCM 6 Infx WBC rising, on 2 AB.  7 Hx craniotomy 8 Schizoaffective P CRRT, TPN , AB, slow vol off. Vent.    LOS: 15 days   Gauge Winski L Ghassan Coggeshall 02/08/2014,7:25 AM

## 2014-01-23 NOTE — Progress Notes (Signed)
PULMONARY / CRITICAL CARE MEDICINE  Name: [name] [name] MRN: [id number] DOB: 03-21-1971    ADMISSION DATE:  01/07/2014  PRIMARY SERVICE: PCCM  CHIEF COMPLAINT:  Dyspnea  BRIEF PATIENT DESCRIPTION: 43 yo NH resident with schizoaffective disorder, polysubstance abuse, seizures, COPD and malnutrition admitted 3/38 with dyspnea and hypotension.  SIGNIFICANT EVENTS / STUDIES:  3/29   Admitted with acute respiratory failure and shock 4/2     Self extubated, re intubated 4/3     Renal function no worse. Hepatic function improving.  4/4     Started phenylephrine  4/5     CRRT started 4/7     Head CT >>> nad 4/6     CT abdo/pelvis >>> Distended and fluid-filled small bowel and colon  4/6     CT chest  >>> large bilateral pleural effusions with overlying atelectasis 4/7     OR >>> ischemic small bowel resection in OR 4/9     TPN started 4/10   OR >>> VAC change, bowel anastamosis, tracheostomy 4/10   Venous Doppler >>> L femoral vein DVT  04/11 IVC filter placed  LINES / TUBES: L IJ CVL 3/29 >>> ETT 3/29 >>> 4/2 (self extubated) >>> 4/10 Trach 4/10 >>> R IJ HD cath 4/5 >>> A-line 4/5 >>> Foley 4/13 >>>  CULTURES: 3/29  MRSA PCR >>> POS 3/29  Urine >>> neg 3/28  Blood >>> neg 3/29  Blood >>> neg 4/5    Blood >>> neg 4/5    Urine >>> neg 3/31  Tracheal >>> neg 4/3    C diff >>> neg 4/5    C diff >>> neg 4/5    Respiratory >>> moderate Candida Albicans  ANTIBIOTICS: Vancomycin  3/29 >>> 4/2; 4/6 >>> 4/10 Zosyn 3/29 >>> 4/6 Flagyl 4/5 >>> 4/7 Vancomycin PO 4/6 >>> 4/7 Micafungin 4/5 >>> 4/8 Meropenem 4/7 >>>  INTERVAL HISTORY:  No acute overnight events.  VITAL SIGNS: Temp:  [94.5 F (34.7 C)-97.5 F (36.4 C)] 97.4 F (36.3 C) (04/13 0828) Pulse Rate:  [88-106] 105 (04/13 0831) Resp:  [14-23] 16 (04/13 0831) BP: (77-122)/(46-75) 86/56 mmHg (04/13 0831) SpO2:  [95 %-100 %] 97 % (04/13 0831) Arterial Line BP: (76-137)/(41-74) 93/44 mmHg (04/13 0700) FiO2 (%):  [40  %] 40 % (04/13 0831) Weight:  [63.4 kg (139 lb 12.4 oz)] 63.4 kg (139 lb 12.4 oz) (04/13 0400)  HEMODYNAMICS: CVP:  [8 mmHg] 8 mmHg  VENTILATOR SETTINGS: Vent Mode:  [-] PRVC FiO2 (%):  [40 %] 40 % Set Rate:  [16 bmp] 16 bmp Vt Set:  [570 mL] 570 mL PEEP:  [5 cmH20] 5 cmH20 Plateau Pressure:  [15 cmH20-22 cmH20] 18 cmH20  INTAKE / OUTPUT: Intake/Output     04/12 0701 - 04/13 0700 04/13 0701 - 04/14 0700   I.V. (mL/kg) 1134 (17.9)    Other 90    IV Piggyback 200    TPN 2040    Total Intake(mL/kg) 3464 (54.6)    Drains 2500    Other 699 134   Total Output 3199 134   Net +265 -134          PHYSICAL EXAMINATION: General:  Appears acutely ill, mechanically ventilated, synchronous Neuro:  Encephalopathic, nonfocal, cough / gag diminished HEENT:  PERRL, tracheostomy intact  Cardiovascular:  RRR, no m/r/g Lungs:  Bilateral diminished air entry, no w/r/r Abdomen:  VAC with serosanguinous drainage, R groin IVC filter placemen site draining yellow fluid Musculoskeletal:  Moves upper extremities non purposefully, no edema Skin:  Skin abrasions, penis ulceration  LABS: CBC  Recent Labs Lab 01/14/2014 0415 01/20/2014 1700 01/17/2014 0422  WBC 20.3* 23.2* 15.8*  HGB 10.2* 9.7* 9.5*  HCT 29.5* 28.9* 28.1*  PLT 27* 21* 15*   Coag's  Recent Labs Lab 01/17/2014 0500 01/22/14 0400 01/16/2014 0422  APTT 38* 41* 39*  INR 1.42 1.25 1.15   BMET  Recent Labs Lab 02/07/2014 0415 01/16/2014 1600 01/14/2014 0422  NA 134* 134* 134*  K 4.9 4.9 4.6  CL 103 101 102  CO2 23 25 24   BUN 9 10 11   CREATININE 0.47* 0.50 0.51  GLUCOSE 179* 130* 145*   Electrolytes  Recent Labs Lab 01/22/14 0400  02/02/2014 0415 01/25/2014 1600 01/25/2014 0422  CALCIUM 7.3*  < > 7.6* 7.3* 7.2*  MG 2.0  --  2.1  --  2.1  PHOS 2.4  < > 2.9 2.3 2.0*  < > = values in this interval not displayed. Sepsis Markers  Recent Labs Lab 01/27/2014 1430 01/20/14 0500 01/22/14 0400  LATICACIDVEN 3.0* 2.7* 2.3*    ABG  Recent Labs Lab 02/07/2014 1114 01/22/14 0430 01/22/14 1806  PHART 7.422 7.329* 7.345*  PCO2ART 34.1* 45.6* 40.7  PO2ART 52.0* 76.0* 54.0*   Liver Enzymes  Recent Labs Lab 02/04/2014 0500  01/22/14 0400  01/14/2014 0415 01/22/2014 1600 01/17/2014 0422  AST 90*  --  60*  --   --   --  66*  ALT 136*  --  88*  --   --   --  73*  ALKPHOS 151*  --  126*  --   --   --  142*  BILITOT 5.8*  --  5.0*  --   --   --  5.1*  ALBUMIN 1.9*  < > 2.1*  < > 1.8* 1.8* 1.6*  < > = values in this interval not displayed. Cardiac Enzymes No results found for this basename: TROPONINI, PROBNP,  in the last 168 hours Glucose  Recent Labs Lab 01/20/2014 1218 01/30/2014 1501 02/06/2014 1945 01/20/2014 0007 01/15/2014 0348 01/20/2014 0809  GLUCAP 124* 112* 136* 148* 148* 135*   IMAGING:  Dg Chest Port 1 View  02/08/2014   CLINICAL DATA:  Respiratory failure  EXAM: PORTABLE CHEST - 1 VIEW  COMPARISON:  DG CHEST 1V PORT dated 02/10/2014; DG CHEST 1V PORT dated 01/22/2014; DG CHEST 1V PORT dated 01/15/2014; DG CHEST 1V PORT dated 02/08/2014; DG CHEST 1V PORT dated 01/17/2014  FINDINGS: The support lines and tubing are in unchanged position. There are bilateral small pleural effusions. There are persistent bilateral interstitial and alveolar airspace opacities involving the upper and lower lungs. There is no pneumothorax. Stable cardiomediastinal silhouette. Unremarkable osseous structures.  IMPRESSION: 1. Persistent bilateral interstitial and alveolar airspace opacities with bilateral small pleural effusions. Differential diagnosis includes pulmonary edema versus multilobar pneumonia versus ARDS.   Electronically Signed   By: Kathreen Devoid   On: 02/02/2014 04:30   Dg Chest Port 1 View  02/09/2014   CLINICAL DATA:  Adult respiratory distress syndrome  EXAM: PORTABLE CHEST - 1 VIEW  COMPARISON:  01/22/2014, 02/08/2014, 01/20/2014 and 01/09/2014  FINDINGS: Right IJ sheath, left IJ central venous catheter, and tracheostomy  remain in satisfactory position. Heart size is normal. Pulmonary vascularity appears normal. There are emphysematous changes. Bilateral perihilar and bibasilar airspace opacities are without significant change compared to 01/22/2014. There posteriorly layering bilateral pleural effusions.  IMPRESSION: No significant change in aeration of the lungs compared to 01/22/2014. Airspace disease in the perihilar  regions and lung bases and bilateral posteriorly layering pleural effusions persist.  Emphysema/ COPD.   Electronically Signed   By: Curlene Dolphin M.D.   On: 01/21/2014 08:55   ASSESSMENT / PLAN:  PULMONARY A: Acute respiratory failure HCAP  ARDS? Large bilateral effusions  COPD without evidence of exacerbation P:   Goal pH>7.30, SpO2>92 Continuous mechanical support, ARDS protocol VAP bundle Daily SBT Trend ABG/CXR DuoNeb / Albuterol  CARDIOVASCULAR A:  Septic shock P:  Decrease Goal MAP > 60 Levophed gtt, wean to off  RENAL A:   AKI P:   Renal following Trend BMP CRRT Defer permanent HD catheter placement D/c condom cath, place Foley  GASTROINTESTINAL A:   Ischemic bowel s/p resection Shocked liver GERD  Severe protein calorie malnutrition GI Px P:  NPO TNA Famotidine with TPN  HEMATOLOGIC A:   Thrombocytopenia and also may have dysfunctional platelets  Acute on anemia LLE DVT s/p IVC filter placement VTE Px P:  Trend CBC Follow HIT panel SCDs Desmopressin x 1  INFECTIOUS A:   HCAP P:   Abc / cx as below  ENDOCRINE A:   Adrenal Insufficiency ( cortisol 14 in presence of shock ) Hypothyroidism Hypo/hyper glycemia P:   Levothyroxine SSI Stress dose steroids  NEURO A: Acute encephalopathy with severe agitation Chronic encephalomalacia  Schizoaffective disorder Seizure disorder No HCPOA identified P:   Start Precedex D/c Fentanyl gtt D/c Versed htt Fentanyl PRN  Goals of care need to be determined.  No family / medical POA.   Ethics committee consulted.  I have personally obtained history, examined patient, evaluated and interpreted laboratory and imaging results, reviewed medical records, formulated assessment / plan and placed orders.  CRITICAL CARE:  The patient is critically ill with multiple organ systems failure and requires high complexity decision making for assessment and support, frequent evaluation and titration of therapies, application of advanced monitoring technologies and extensive interpretation of multiple databases. Critical Care Time devoted to patient care services described in this note is 35 minutes.   Doree Fudge, MD Pulmonary [facility] [place] Pager: [phone]  01/29/2014, 10:52 AM

## 2014-01-23 NOTE — Progress Notes (Signed)
CRITICAL VALUE ALERT  Critical value received:  Plt= 27  Date of notification:  02/04/2014  Time of notification:  0459  Critical value read back:yes  Nurse who received alert:  Delphia Grates RN  MD notified (1st page): DR Kieth Brightly  Time of first page:  0421  MD notified (2nd page):  Time of second page:  Responding MD:  Dr Kieth Brightly  Time MD responded:  [phone]

## 2014-01-24 ENCOUNTER — Encounter (HOSPITAL_COMMUNITY): Admission: EM | Disposition: E | Payer: Self-pay | Source: Skilled Nursing Facility | Attending: Internal Medicine

## 2014-01-24 ENCOUNTER — Inpatient Hospital Stay (HOSPITAL_COMMUNITY): Payer: Medicare Other

## 2014-01-24 ENCOUNTER — Encounter (HOSPITAL_COMMUNITY): Payer: Self-pay | Admitting: General Surgery

## 2014-01-24 LAB — COMPREHENSIVE METABOLIC PANEL
ALT: 73 U/L — ABNORMAL HIGH (ref 0–53)
AST: 66 U/L — AB (ref 0–37)
Albumin: 1.6 g/dL — ABNORMAL LOW (ref 3.5–5.2)
Alkaline Phosphatase: 142 U/L — ABNORMAL HIGH (ref 39–117)
BILIRUBIN TOTAL: 5.1 mg/dL — AB (ref 0.3–1.2)
BUN: 11 mg/dL (ref 6–23)
CALCIUM: 7.2 mg/dL — AB (ref 8.4–10.5)
CHLORIDE: 102 meq/L (ref 96–112)
CO2: 24 mEq/L (ref 19–32)
Creatinine, Ser: 0.51 mg/dL (ref 0.50–1.35)
GFR calc Af Amer: >90 mL/min (ref >90)
Glucose, Bld: 145 mg/dL — ABNORMAL HIGH (ref 70–99)
Potassium: 4.6 mEq/L (ref 3.7–5.3)
SODIUM: 134 meq/L — AB (ref 137–147)
Total Protein: 4.7 g/dL — ABNORMAL LOW (ref 6.0–8.3)

## 2014-01-24 LAB — RENAL FUNCTION PANEL
Albumin: 1.7 g/dL — ABNORMAL LOW (ref 3.5–5.2)
BUN: 11 mg/dL (ref 6–23)
CALCIUM: 7.2 mg/dL — AB (ref 8.4–10.5)
CO2: 26 mEq/L (ref 19–32)
Chloride: 102 mEq/L (ref 96–112)
Creatinine, Ser: 0.54 mg/dL (ref 0.50–1.35)
GFR calc Af Amer: >90 mL/min (ref >90)
GFR calc non Af Amer: >90 mL/min (ref >90)
Glucose, Bld: 122 mg/dL — ABNORMAL HIGH (ref 70–99)
PHOSPHORUS: 2.9 mg/dL (ref 2.3–4.6)
Potassium: 4.7 mEq/L (ref 3.7–5.3)
Sodium: 137 mEq/L (ref 137–147)

## 2014-01-24 LAB — CBC
HCT: 28.1 % — ABNORMAL LOW (ref 39.0–52.0)
Hemoglobin: 9.5 g/dL — ABNORMAL LOW (ref 13.0–17.0)
MCH: 30 pg (ref 26.0–34.0)
MCHC: 33.8 g/dL (ref 30.0–36.0)
MCV: 88.6 fL (ref 78.0–100.0)
PLATELETS: 15 10*3/uL — AB (ref 150–400)
RBC: 3.17 MIL/uL — ABNORMAL LOW (ref 4.22–5.81)
RDW: 17.1 % — ABNORMAL HIGH (ref 11.5–15.5)
WBC: 15.8 10*3/uL — ABNORMAL HIGH (ref 4.0–10.5)

## 2014-01-24 LAB — DIFFERENTIAL
BASOS PCT: 1 % (ref 0–1)
Basophils Absolute: 0.2 10*3/uL — ABNORMAL HIGH (ref 0.0–0.1)
EOS ABS: 0 10*3/uL (ref 0.0–0.7)
EOS PCT: 0 % (ref 0–5)
Lymphocytes Relative: 8 % — ABNORMAL LOW (ref 12–46)
Lymphs Abs: 1.3 10*3/uL (ref 0.7–4.0)
Monocytes Absolute: 1.1 10*3/uL — ABNORMAL HIGH (ref 0.1–1.0)
Monocytes Relative: 7 % (ref 3–12)
NEUTROS ABS: 13.2 10*3/uL — AB (ref 1.7–7.7)
Neutrophils Relative %: 84 % — ABNORMAL HIGH (ref 43–77)

## 2014-01-24 LAB — GLUCOSE, CAPILLARY
GLUCOSE-CAPILLARY: 135 mg/dL — AB (ref 70–99)
GLUCOSE-CAPILLARY: 141 mg/dL — AB (ref 70–99)
GLUCOSE-CAPILLARY: 130 mg/dL — AB (ref 70–99)
GLUCOSE-CAPILLARY: 92 mg/dL (ref 70–99)
Glucose-Capillary: 148 mg/dL — ABNORMAL HIGH (ref 70–99)
Glucose-Capillary: 148 mg/dL — ABNORMAL HIGH (ref 70–99)

## 2014-01-24 LAB — PREALBUMIN: PREALBUMIN: 10.9 mg/dL — AB (ref 17.0–34.0)

## 2014-01-24 LAB — PHENYTOIN LEVEL, TOTAL: PHENYTOIN LVL: 3.8 ug/mL — AB (ref 10.0–20.0)

## 2014-01-24 LAB — PROTIME-INR
INR: 1.15 (ref 0.00–1.49)
PROTHROMBIN TIME: 14.5 s (ref 11.6–15.2)

## 2014-01-24 LAB — MAGNESIUM: Magnesium: 2.1 mg/dL (ref 1.5–2.5)

## 2014-01-24 LAB — APTT: APTT: 39 s — AB (ref 24–37)

## 2014-01-24 LAB — PHOSPHORUS: Phosphorus: 2 mg/dL — ABNORMAL LOW (ref 2.3–4.6)

## 2014-01-24 LAB — ACTH: C206 ACTH: 9 pg/mL — ABNORMAL LOW (ref 6–50)

## 2014-01-24 LAB — CREATININE, FLUID (PLEURAL, PERITONEAL, JP DRAINAGE): Creat, Fluid: 0.6 mg/dL

## 2014-01-24 LAB — TRIGLYCERIDES: Triglycerides: 136 mg/dL (ref <150)

## 2014-01-24 SURGERY — INSERTION OF DIALYSIS CATHETER
Anesthesia: Monitor Anesthesia Care

## 2014-01-24 MED ORDER — SODIUM CHLORIDE 0.9 % IV SOLN
0.3000 ug/kg | Freq: Once | INTRAVENOUS | Status: AC
  Administered 2014-01-24: 19.2 ug via INTRAVENOUS
  Filled 2014-01-24: qty 4.8

## 2014-01-24 MED ORDER — TRACE MINERALS CR-CU-F-FE-I-MN-MO-SE-ZN IV SOLN
INTRAVENOUS | Status: DC
  Administered 2014-01-24: 17:00:00 via INTRAVENOUS
  Filled 2014-01-24: qty 2000

## 2014-01-24 MED ORDER — FAT EMULSION 20 % IV EMUL
250.0000 mL | INTRAVENOUS | Status: DC
  Administered 2014-01-24: 250 mL via INTRAVENOUS
  Filled 2014-01-24: qty 250

## 2014-01-24 MED ORDER — PHENYTOIN SODIUM 50 MG/ML IJ SOLN
50.0000 mg | Freq: Three times a day (TID) | INTRAMUSCULAR | Status: DC
  Administered 2014-01-24 – 2014-01-25 (×2): 50 mg via INTRAVENOUS
  Filled 2014-01-24 (×5): qty 1

## 2014-01-24 MED ORDER — PANTOPRAZOLE SODIUM 40 MG IV SOLR: 40.0000 mg | INTRAVENOUS | Status: DC

## 2014-01-24 MED ORDER — SODIUM PHOSPHATE 3 MMOLE/ML IV SOLN
20.0000 mmol | Freq: Once | INTRAVENOUS | Status: AC
  Administered 2014-01-24: 20 mmol via INTRAVENOUS
  Filled 2014-01-24: qty 6.67

## 2014-01-24 MED ORDER — DEXMEDETOMIDINE HCL IN NACL 400 MCG/100ML IV SOLN
0.4000 ug/kg/h | INTRAVENOUS | Status: DC
  Administered 2014-01-24: 0.4 ug/kg/h via INTRAVENOUS
  Administered 2014-01-25: 0.9 ug/kg/h via INTRAVENOUS
  Administered 2014-01-25: 0.7 ug/kg/h via INTRAVENOUS
  Filled 2014-01-24 (×3): qty 100

## 2014-01-24 MED ORDER — DEXMEDETOMIDINE HCL IN NACL 200 MCG/50ML IV SOLN
0.4000 ug/kg/h | INTRAVENOUS | Status: DC
  Administered 2014-01-24: 0.4 ug/kg/h via INTRAVENOUS
  Filled 2014-01-24: qty 50

## 2014-01-24 NOTE — Ethics Note (Addendum)
Ethics Committee Consult  Date: 01/13/2014 Time: 11:34 PM  Primary Committee Member:  Lenoria Farrier, PhD, RN, CNE Secondary Committee Member:   Does the patient have decision making capacity?  No - secondary to critical condition, mechanical ventilation and endotracheal intubation, IV sedation (dexmedetomidine) and PRN doses of narcotic analgesic (Fentanyl) and AMS when sedatives decreased or removed.  Does the patient have an Scientist, physiological or [place]?  No  Legal Decision Maker:  None Relationship to Patient: No family available per RN Maryan Char and CSW notes by Donia Pounds Mitigating Factors:  Yes- Pt has "wife" recorded throughout previous visits which is later clarified by CSW to be referal to a significant other Darnelle Maffucci). Mr. Reather Converse and Ms. Eden Lathe are not legally married and according to [facility] notes had been estranged prior to this hospitalization. T  Name and contact information for person who requested the consult:  Dr. Nicoletta Dress (Resident MD) for CCM  Attending Physician:  Raylene Miyamoto, MD Was attending physician notified? Yes  Other individuals involved, present/attending, their names, relationship/role to the patient:   Dr. Nicoletta Dress Resident MD for [place], RN 43M [phone])  Individuals involved but unable to attend/participate their names, relationship/role to the patient:    Reason for the consult / ethical problem (eg. Goals of care, values clarification, forum for discussion, conflict resolution, autonomy, beneficence, etc.):  Per nursing, consult called for goals of care   Preliminary Information:  43 y/o M admitted with Septic Shock, leg pain, DVT, small bowel disease, renal failure. No known legal next of kin. Pt resides at an assisted living facility where he independently makes decisions (see CSW notes).  Objective facts related to or contributing to the problem (eg.  Pertinent medical history, facts of the case):  From CCM progress  notes & other related case notes:  3/28 admitted with [facility] and leg pain, hypotension  3/29 Intubated, CVC placed, septic shock  3/31 agitated  4/1 CSW note regarding lack of NOK and pt's independence in facility  4/2 self extubated, re-intubated  4/5 Renal function decreased, started on CRRT  4/7 CSW spoke with MD regarding best interest of patient and began to explore legal guardianship. No further f/u noted. Multiple problems-->to OR  4/8-4/10 To OR several times for VAC change and bleeding. Tracheostomy on 4/10  4/10 Diagnosed with L DVT  4/11 IVC filter placed   Summary of consultation and recommendations:  Spoke with Daron Offer, RN this afternoon (~1330). She confirmed above facts about condition and family relationships. Established need for Ethics consult to assist in clarifying or setting goals of care and how to proceed in the patient's best interest in the absence of family and inability of the patient to self determine goals of his care. Requested RN to arrange meeting with healthcare team (MDx2, CSW, RNs, pt's living facility and [facility] ethics committee).   I will call in AM (4/14)  to establish meeting time and location.  Thank you for consulting the [facility].

## 2014-01-24 NOTE — Progress Notes (Signed)
3 Days Post-Op  Subjective: Trached, ventilated, sedated Less VAC output Large amount of serous drainage coming from R femoral IVC filter insertion site - wound manager in place  Objective: Vital signs in last 24 hours: Temp:  [94.5 F (34.7 C)-97.5 F (36.4 C)] 97.4 F (36.3 C) (04/13 0828) Pulse Rate:  [88-106] 105 (04/13 0831) Resp:  [14-23] 16 (04/13 0831) BP: (77-122)/(46-75) 86/56 mmHg (04/13 0831) SpO2:  [95 %-100 %] 97 % (04/13 0831) Arterial Line BP: (76-137)/(41-74) 93/44 mmHg (04/13 0700) FiO2 (%):  [40 %] 40 % (04/13 0831) Weight:  [139 lb 12.4 oz (63.4 kg)] 139 lb 12.4 oz (63.4 kg) (04/13 0400) Last BM Date: 01/30/2014  Intake/Output from previous day: 04/12 0701 - 04/13 0700 In: 3464 [I.V.:1134; IV Piggyback:200; TPN:2040] Out: 3199 [Drains:2500] Intake/Output this shift: Total I/O In: -  Out: 65 [Other:65]  General appearance: trached, minimally responsive;  Resp: diminished breath sounds bilaterally; no wheezing Cardio: regular rate and rhythm, S1, S2 normal, no murmur, click, rub or gallop GI: minimal bowel sounds; mildly distended;  VAC with good seal - thin serous output  Lab Results:   Recent Labs  02/06/2014 1700 02/08/2014 0422  WBC 23.2* 15.8*  HGB 9.7* 9.5*  HCT 28.9* 28.1*  PLT 21* 15*   BMET  Recent Labs  02/07/2014 1600 01/12/2014 0422  NA 134* 134*  K 4.9 4.6  CL 101 102  CO2 25 24  GLUCOSE 130* 145*  BUN 10 11  CREATININE 0.50 0.51  CALCIUM 7.3* 7.2*   PT/INR  Recent Labs  01/22/14 0400 02/08/2014 0422  LABPROT 15.4* 14.5  INR 1.25 1.15   ABG  Recent Labs  01/22/14 0430 01/22/14 1806  PHART 7.329* 7.345*  HCO3 24.9* 23.5    Studies/Results: Ir Ivc Filter Plmt / S&i /img Guid/mod Sed  01/22/2014   INDICATION: History of hospital acquired pneumonia complicated by severe sepsis, respiratory failure (post tracheostomy), and bowel ischemia (post partial small bowel resection), now with left lower extremity DVT  EXAM:  ULTRASOUND GUIDANCE FOR VASCULAR ACCESS  IVC CATHETERIZATION AND VENOGRAM  IVC FILTER INSERTION  MEDICATIONS: None  ANESTHESIA/SEDATION: None - patient is currently in the ICU and sedated  CONTRAST:  11mL OMNIPAQUE IOHEXOL 300 MG/ML  SOLN  FLUOROSCOPY TIME:  1 minute  COMPLICATIONS: None immediate  PROCEDURE: An emergency consent was obtained. A time out was performed prior to the initiation of the procedure. Maximal barrier sterile technique utilized including caps, mask, sterile gowns, sterile gloves, large sterile drape, hand hygiene, and Betadine prep.  Under sterile condition and local anesthesia, right common femoral venous access was performed with ultrasound. An ultrasound image was saved and sent to PACS. Over a guidewire, the IVC filter delivery sheath and inner dilator were advanced into the IVC just above the IVC bifurcation. Contrast injection was performed for an IVC venogram.  Through the delivery sheath, a retrievable [place] IVC filter was deployed below the level of the renal veins and above the IVC bifurcation. Limited post deployment venacavagram was performed.  The delivery sheath was removed and hemostasis was obtained with manual compression. A dressing was placed. The patient tolerated the procedure well without immediate post procedural complication.  FINDINGS: The IVC is patent. No evidence of thrombus, stenosis, or occlusion. No variant venous anatomy. Successful placement of the IVC filter below the level of the renal veins.  IMPRESSION: Successful ultrasound and fluoroscopically guided placement of an infrarenal retrievable IVC filter.   Electronically Signed   By: Jenny Reichmann  Watts M.D.   On: 01/22/2014 10:37   Dg Chest Port 1 View  02/06/2014   CLINICAL DATA:  Respiratory failure  EXAM: PORTABLE CHEST - 1 VIEW  COMPARISON:  DG CHEST 1V PORT dated 01/25/2014; DG CHEST 1V PORT dated 01/22/2014; DG CHEST 1V PORT dated 01/22/2014; DG CHEST 1V PORT dated 01/30/2014; DG CHEST 1V PORT dated 01/17/2014   FINDINGS: The support lines and tubing are in unchanged position. There are bilateral small pleural effusions. There are persistent bilateral interstitial and alveolar airspace opacities involving the upper and lower lungs. There is no pneumothorax. Stable cardiomediastinal silhouette. Unremarkable osseous structures.  IMPRESSION: 1. Persistent bilateral interstitial and alveolar airspace opacities with bilateral small pleural effusions. Differential diagnosis includes pulmonary edema versus multilobar pneumonia versus ARDS.   Electronically Signed   By: Kathreen Devoid   On: 01/31/2014 04:30   Dg Chest Port 1 View  01/27/2014   CLINICAL DATA:  Adult respiratory distress syndrome  EXAM: PORTABLE CHEST - 1 VIEW  COMPARISON:  01/22/2014, 01/24/2014, 01/20/2014 and 01/09/2014  FINDINGS: Right IJ sheath, left IJ central venous catheter, and tracheostomy remain in satisfactory position. Heart size is normal. Pulmonary vascularity appears normal. There are emphysematous changes. Bilateral perihilar and bibasilar airspace opacities are without significant change compared to 01/22/2014. There posteriorly layering bilateral pleural effusions.  IMPRESSION: No significant change in aeration of the lungs compared to 01/22/2014. Airspace disease in the perihilar regions and lung bases and bilateral posteriorly layering pleural effusions persist.  Emphysema/ COPD.   Electronically Signed   By: Curlene Dolphin M.D.   On: 01/22/2014 08:55    Anti-infectives: Anti-infectives   Start     Dose/Rate Route Frequency Ordered Stop   01/20/14 1300  vancomycin (VANCOCIN) IVPB 1000 mg/200 mL premix  Status:  Discontinued     1,000 mg 200 mL/hr over 60 Minutes Intravenous Every 24 hours 01/20/14 1152 01/22/14 0932   01/16/2014 0000  meropenem (MERREM) 1 g in sodium chloride 0.9 % 100 mL IVPB     1 g 200 mL/hr over 30 Minutes Intravenous Every 12 hours 01/17/14 1405     01/17/14 1300  vancomycin (VANCOCIN) IVPB 750 mg/150 ml premix   Status:  Discontinued     750 mg 150 mL/hr over 60 Minutes Intravenous Every 24 hours 01/17/14 1211 01/20/14 1152   01/17/14 1000  meropenem (MERREM) 2 g in sodium chloride 0.9 % 100 mL IVPB  Status:  Discontinued     2 g 200 mL/hr over 30 Minutes Intravenous Every 18 hours 01/17/14 0913 01/17/14 1405   01/17/14 0000  vancomycin (VANCOCIN) 50 mg/mL oral solution 500 mg  Status:  Discontinued     500 mg Oral 4 times per day 01/16/14 2008 02/06/2014 0952   01/17/14 0000  piperacillin-tazobactam (ZOSYN) IVPB 3.375 g  Status:  Discontinued     3.375 g 100 mL/hr over 30 Minutes Intravenous 4 times per day 01/16/14 2202 01/17/14 0854   01/16/14 2200  metroNIDAZOLE (FLAGYL) tablet 500 mg  Status:  Discontinued     500 mg Oral 3 times per day 01/16/14 1905 01/16/14 2002   01/16/14 2015  metroNIDAZOLE (FLAGYL) IVPB 500 mg  Status:  Discontinued     500 mg 100 mL/hr over 60 Minutes Intravenous Every 8 hours 01/16/14 2002 02/10/2014 1001   01/16/14 2000  vancomycin (VANCOCIN) 50 mg/mL oral solution 250 mg  Status:  Discontinued     250 mg Oral 4 times per day 01/16/14 1957 01/16/14 2008  01/16/14 2000  micafungin (MYCAMINE) 100 mg in sodium chloride 0.9 % 100 mL IVPB  Status:  Discontinued     100 mg 100 mL/hr over 1 Hours Intravenous Daily 01/16/14 1957 02/02/2014 1117   01/16/14 1800  piperacillin-tazobactam (ZOSYN) IVPB 3.375 g  Status:  Discontinued     3.375 g 12.5 mL/hr over 240 Minutes Intravenous 4 times per day 01/16/14 1648 01/16/14 2202   01/12/14 1200  piperacillin-tazobactam (ZOSYN) IVPB 2.25 g  Status:  Discontinued     2.25 g 100 mL/hr over 30 Minutes Intravenous 4 times per day 01/12/14 0844 01/16/14 1648   01/12/14 0500  vancomycin (VANCOCIN) 500 mg in sodium chloride 0.9 % 100 mL IVPB  Status:  Discontinued     500 mg 100 mL/hr over 60 Minutes Intravenous Every 24 hours 01/11/14 0902 01/12/14 1242   01/10/14 0400  vancomycin (VANCOCIN) IVPB 750 mg/150 ml premix  Status:  Discontinued      750 mg 150 mL/hr over 60 Minutes Intravenous Every 24 hours 01/09/14 0353 01/11/14 0902   01/09/14 1200  piperacillin-tazobactam (ZOSYN) IVPB 3.375 g  Status:  Discontinued     3.375 g 12.5 mL/hr over 240 Minutes Intravenous Every 8 hours 01/09/14 0353 01/12/14 0844   01/09/14 0330  piperacillin-tazobactam (ZOSYN) IVPB 3.375 g     3.375 g 100 mL/hr over 30 Minutes Intravenous  Once 01/09/14 0329 01/09/14 0358   01/09/14 0330  vancomycin (VANCOCIN) IVPB 1000 mg/200 mL premix     1,000 mg 200 mL/hr over 60 Minutes Intravenous  Once 01/09/14 0329 01/09/14 0437      Assessment/Plan: s/p Procedure(s): TRACHEOSTOMY (N/A) Partial Small Bowel Resection and Repair and Re-anastomosis (N/A) GASTROSTOMY (N/A) APPLICATION OF WOUND VAC (N/A) s/p exploratory laparotomy with SB resection;  Anasarca with large amount of serous drainage from abdomen and from groin insertion sitee Less VAC output Will change VAC Tuesday   LOS: 16 days    Imogene Burn. Jasraj Lappe 02/05/2014

## 2014-01-24 NOTE — Progress Notes (Addendum)
PARENTERAL NUTRITION CONSULT NOTE- Follow Up  Pharmacy Consult for TPN Indication: Massive Bowel Resection  Allergies  Allergen Reactions  . Fexofenadine Hcl Other (See Comments)    unknown  . Percocet [Oxycodone-Acetaminophen] Other (See Comments)    unknown  . Thorazine [Chlorpromazine Hcl] Other (See Comments)    unknown   Patient Measurements: Height: 6\' 2"  (188 cm) Weight: 139 lb 12.4 oz (63.4 kg) IBW/kg (Calculated) : 82.2  Vital Signs: Temp: 97.5 F (36.4 C) (04/13 0400) Temp src: Oral (04/13 0400) BP: 86/56 mmHg (04/13 0700) Pulse Rate: 103 (04/13 0700) Intake/Output from previous day: 04/12 0701 - 04/13 0700 In: 3464 [I.V.:1134; IV Piggyback:200; TPN:2040] Out: 3199 [Drains:2500] Intake/Output from this shift:    Labs:  Recent Labs  01/22/14 0400  01/12/2014 0415 01/18/2014 1700 01/27/2014 0422  WBC 19.6*  < > 20.3* 23.2* 15.8*  HGB 10.0*  < > 10.2* 9.7* 9.5*  HCT 28.7*  < > 29.5* 28.9* 28.1*  PLT 44*  < > 27* 21* 15*  APTT 41*  --   --   --  39*  INR 1.25  --   --   --  1.15  < > = values in this interval not displayed.   Recent Labs  02/15/14 1600 01/22/14 0400  01/13/2014 0415 01/14/2014 1600 01/31/2014 0422  NA 134* 136*  < > 134* 134* 134*  K 4.4 4.0  < > 4.9 4.9 4.6  CL 99 102  < > 103 101 102  CO2 23 23  < > 23 25 24   GLUCOSE 151* 173*  < > 179* 130* 145*  BUN 8 7  < > 9 10 11   CREATININE 0.76 0.56  < > 0.47* 0.50 0.51  CALCIUM 7.6* 7.3*  < > 7.6* 7.3* 7.2*  MG  --  2.0  --  2.1  --  2.1  PHOS 5.1* 2.4  < > 2.9 2.3 2.0*  PROT  --  4.6*  --   --   --  4.7*  ALBUMIN 2.3* 2.1*  < > 1.8* 1.8* 1.6*  AST  --  60*  --   --   --  66*  ALT  --  88*  --   --   --  73*  ALKPHOS  --  126*  --   --   --  142*  BILITOT  --  5.0*  --   --   --  5.1*  PREALBUMIN  --  8.7*  --   --   --   --   TRIG  --  184*  --   --   --  136  < > = values in this interval not displayed. Estimated Creatinine Clearance: 106.8 ml/min (by C-G formula based on Cr of 0.51).     Recent Labs  02/10/2014 1945 01/23/2014 0007 02/03/2014 0348  GLUCAP 136* 148* 148*   Insulin Requirements in the past 24 hours:  4 units SSI  Current Nutrition:  Clinimix E 5/15 at 75 mL/hr + lipid 20% emulsion at 10 mL/hr provides 90g protein and 1758 kcal daily (100% of goal)  Nutritional Goals:  1700 kcal, 85-100g protein daily per RD recommendations 4/10   Assessment: 19 YOM who presented to [facility] with septic shock and HCAP on 3/29 was intubated shortly after admission. He is s/p ischemic small bowel resection on 4/7    GI: Patient has been off tube feeding since 4/5. Feeding supplements were stopped 4/8 and he is now NPO  with bowel rest. He has a history of GERD with chronic PPI use. S/p tracheostomy, partial small bowel resection and re-anastomosis with application of wound vac on 4/10. Drain output 4/10 2.6L yesterday.  Prealbumin 8.7 (low at baseline).  AC: New LE DVT on U/S. Cannot anticoagulate. S/p IVC filter placement 4/11   Endo: CBGs well controlled. Pt continues on stress dose steroids. cortisol 14.4 on 4/8  Lytes: Na 134 (slightly low), Phos 2, CorrCa 9.1. Other lytes wnl.  Renal: Tolerating CVVHDF.  Pulm: Vent 40% FiO2. S/p trach 4/10.   Cards: Requiring levophed at 3 mcg/min. Tachy.    Hepatobil: LFTs elevated but trending down. TBilli remains elevated at 5.1. TG trended down to 136.   Neuro: hx schizoaffective disorder, seizures and polysubstance abuse. On fentanyl and versed.  ID: Day #8 of Merrem for RLL HCAP. WBC down to 15.8. HYPOthermic. Repeat cultures pending.  Best Practices: SCDs, IV Pepcid (In TPN bag)   TPN Access: CVC Triple lumen 3/29  TPN day#: 4 (started 4/9)  Plan:  1. Continue Clinimix E 5/15 WITH LYTES at 75 mL/hr + lipid 20% emulsion at 10 mL/hr. This will provide 90g protein and 1758 kcal daily (100% of goal) 2. Multivitamin daily and trace elements MWF only due to shortage. 3. Pepcid being added to TPN bag due to shortage of IV  protonix 4. Will replace Phos with NaPhos 20 mmol 5. F/u TPN labs  Sherlon Handing, PharmD, BCPS Clinical pharmacist, pager [phone] 01/12/2014 8:15 AM

## 2014-01-24 NOTE — Procedures (Signed)
Admit: 21-Jan-2014 LOS: 37  35M with SZAP in group home admitted with septic shock, HCAP, septic ATN on CRRT and ischemic bowen s/p resection.    Current CRRT Prescription: Start Date: 01/16/14  Catheter: R IJ temp  BFR: 250 Pre Blood Pump: 800 DFR: 1500 Replacement Rate: 700 Goal UF: 11mL/hr neg Anticoagulation: none Clotting: q16-20h   S: Having ongoing yellow drainage from R femoral site where IVC filter placed No meaningful UOP, condom cath Not following commands Stable hemodynamics/resp status WBC improved  O: 04/12 0701 - 04/13 0700 In: 3546 [I.V.:1134; IV Piggyback:200; TPN:2040] Out: 3199 [Drains:2500]  Filed Weights   01/22/14 0315 01/18/2014 0400 02/06/2014 0400  Weight: 62.7 kg (138 lb 3.7 oz) 65.4 kg (144 lb 2.9 oz) 63.4 kg (139 lb 12.4 oz)     Recent Labs Lab 02/05/2014 0415 02/07/2014 1600 01/27/2014 0422  NA 134* 134* 134*  K 4.9 4.9 4.6  CL 103 101 102  CO2 23 25 24   GLUCOSE 179* 130* 145*  BUN 9 10 11   CREATININE 0.47* 0.50 0.51  CALCIUM 7.6* 7.3* 7.2*  PHOS 2.9 2.3 2.0*    Recent Labs Lab 01/20/14 1223  01/22/14 0400  02/08/2014 0415 02/08/2014 1700 01/27/2014 0422  WBC 19.5*  < > 19.6*  < > 20.3* 23.2* 15.8*  NEUTROABS 15.8*  --  16.0*  --   --   --  13.2*  HGB 11.3*  < > 10.0*  < > 10.2* 9.7* 9.5*  HCT 30.9*  < > 28.7*  < > 29.5* 28.9* 28.1*  MCV 80.7  < > 85.2  < > 88.1 89.2 88.6  PLT 60*  < > 44*  < > 27* 21* 15*  < > = values in this interval not displayed.  ABG    Component Value Date/Time   PHART 7.345* 01/22/2014 1806   PCO2ART 40.7 01/22/2014 1806   PO2ART 54.0* 01/22/2014 1806   HCO3 23.5 01/22/2014 1806   TCO2 25 01/22/2014 1806   ACIDBASEDEF 3.0* 01/22/2014 1806   O2SAT 92.0 01/22/2014 1806    Plan:  1. 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  Good solute clearance.  Goal UF around 66mL net negative.  Clotting rate acceptable.  Lytes ok.   2. Copius drainage from femoral access site: likely oozing  from anasarca and hypo-oncotic state. Check creatinine from fluid to ensure no GU injury durign procedure.  3. Septic Shock: per CCM/CCS 4. VDRF 5. Hypoalbuminemia. Alb 1.6  Pearson Grippe, MD [employer] pgr [phone]

## 2014-01-24 NOTE — Progress Notes (Signed)
MEDICATION CONSULT NOTE - FOLLOW UP  Pharmacy Consult:  Dilantin Indication:  Seizure  Allergies  Allergen Reactions  . Fexofenadine Hcl Other (See Comments)    unknown  . Percocet [Oxycodone-Acetaminophen] Other (See Comments)    unknown  . Thorazine [Chlorpromazine Hcl] Other (See Comments)    unknown    Patient Measurements: Height: 6\' 2"  (188 cm) Weight: 139 lb 12.4 oz (63.4 kg) IBW/kg (Calculated) : 82.2  Vital Signs: Temp: 98.4 F (36.9 C) (04/13 1214) Temp src: Oral (04/13 1214) BP: 103/52 mmHg (04/13 1140) Pulse Rate: 109 (04/13 1140) Intake/Output from previous day: 04/12 0701 - 04/13 0700 In: 3464 [I.V.:1134; IV Piggyback:200; TPN:2040] Out: 3199 [Drains:2500] Intake/Output from this shift: Total I/O In: 570.2 [I.V.:187.2; IV Piggyback:43; TPN:340] Out: 570 [Drains:300; Other:270]  Labs:  Recent Labs  02/10/2014 0415 01/20/2014 1600 02/06/2014 1700 2014-02-19 0422  WBC 20.3*  --  23.2* 15.8*  HGB 10.2*  --  9.7* 9.5*  PLT 27*  --  21* 15*  CREATININE 0.47* 0.50  --  0.51   Estimated Creatinine Clearance: 106.8 ml/min (by C-G formula based on Cr of 0.51). No results found for this basename: VANCOTROUGH, VANCOPEAK, VANCORANDOM, [place], GENTPEAK, GENTRANDOM, TOBRATROUGH, TOBRAPEAK, TOBRARND, AMIKACINPEAK, AMIKACINTROU, AMIKACIN,  in the last 72 hours   Microbiology: Recent Results (from the past 720 hour(s))  CULTURE, BLOOD (ROUTINE X 2)     Status: None   Collection Time    01/07/2014 11:58 PM      Result Value Ref Range Status   Specimen Description BLOOD WRIST RIGHT   Final   Special Requests BOTTLES DRAWN AEROBIC AND ANAEROBIC 5CC EA   Final   Culture  Setup Time     Final   Value: 01/09/2014 03:47     Performed at [facility]   Culture     Final   Value: NO GROWTH 5 DAYS     Performed at [facility]   Report Status 01/15/2014 FINAL   Final  CULTURE, BLOOD (ROUTINE X 2)     Status: None   Collection Time    01/09/14 12:24 AM       Result Value Ref Range Status   Specimen Description BLOOD LEFT ARM   Final   Special Requests BOTTLES DRAWN AEROBIC ONLY 10CC   Final   Culture  Setup Time     Final   Value: 01/09/2014 03:47     Performed at [facility]   Culture     Final   Value: NO GROWTH 5 DAYS     Performed at [facility]   Report Status 01/15/2014 FINAL   Final  URINE CULTURE     Status: None   Collection Time    01/09/14  2:42 AM      Result Value Ref Range Status   Specimen Description URINE, CATHETERIZED   Final   Special Requests ADD [id number]   Final   Culture  Setup Time     Final   Value: 01/09/2014 14:10     Performed at [place]     Final   Value: NO GROWTH     Performed at [facility]   Culture     Final   Value: NO GROWTH     Performed at [facility]   Report Status 01/10/2014 FINAL   Final  MRSA PCR SCREENING     Status: Abnormal   Collection Time  01/09/14  4:50 AM      Result Value Ref Range Status   MRSA by PCR POSITIVE (*) NEGATIVE Final   Comment:            The GeneXpert MRSA Assay (FDA     approved for NASAL specimens     only), is one component of a     comprehensive MRSA colonization     surveillance program. It is not     intended to diagnose MRSA     infection nor to guide or     monitor treatment for     MRSA infections.     RESULT CALLED TO, READ BACK BY AND VERIFIED WITH:     MACK,B RN [phone] 01/09/14 MITCHELL,L  CULTURE, RESPIRATORY (NON-EXPECTORATED)     Status: None   Collection Time    01/11/14 11:53 AM      Result Value Ref Range Status   Specimen Description TRACHEAL ASPIRATE   Final   Special Requests NONE   Final   Gram Stain     Final   Value: NO WBC SEEN     NO SQUAMOUS EPITHELIAL CELLS SEEN     NO ORGANISMS SEEN     Performed at [facility]   Culture     Final   Value: NO GROWTH 2 DAYS     Performed at [facility]   Report Status 01/13/2014 FINAL   Final   CLOSTRIDIUM DIFFICILE BY PCR     Status: None   Collection Time    01/14/14  2:17 PM      Result Value Ref Range Status   C difficile by pcr NEGATIVE  NEGATIVE Final  CULTURE, BLOOD (ROUTINE X 2)     Status: None   Collection Time    01/16/14  8:53 PM      Result Value Ref Range Status   Specimen Description BLOOD RIGHT HAND   Final   Special Requests BOTTLES DRAWN AEROBIC ONLY 5CC   Final   Culture  Setup Time     Final   Value: 01/17/2014 02:22     Performed at [facility]   Culture     Final   Value: NO GROWTH 5 DAYS     Performed at [facility]   Report Status Feb 13, 2014 FINAL   Final  CULTURE, BLOOD (ROUTINE X 2)     Status: None   Collection Time    01/16/14  9:00 PM      Result Value Ref Range Status   Specimen Description BLOOD RIGHT HAND   Final   Special Requests BOTTLES DRAWN AEROBIC ONLY 3CC   Final   Culture  Setup Time     Final   Value: 01/17/2014 02:22     Performed at [facility]   Culture     Final   Value: NO GROWTH 5 DAYS     Performed at [facility]   Report Status 02-13-14 FINAL   Final  URINE CULTURE     Status: None   Collection Time    01/16/14  9:50 PM      Result Value Ref Range Status   Specimen Description URINE, RANDOM   Final   Special Requests NONE   Final   Culture  Setup Time     Final   Value: 01/16/2014 22:19     Performed at [place]     Final  Value: NO GROWTH     Performed at [facility]   Culture     Final   Value: NO GROWTH     Performed at [facility]   Report Status 01/17/2014 FINAL   Final  CLOSTRIDIUM DIFFICILE BY PCR     Status: None   Collection Time    01/16/14  9:57 PM      Result Value Ref Range Status   C difficile by pcr NEGATIVE  NEGATIVE Final  CULTURE, RESPIRATORY (NON-EXPECTORATED)     Status: None   Collection Time    01/16/14  9:59 PM      Result Value Ref Range Status   Specimen Description TRACHEAL ASPIRATE   Final    Special Requests NONE   Final   Gram Stain     Final   Value: FEW WBC PRESENT,BOTH PMN AND MONONUCLEAR     NO SQUAMOUS EPITHELIAL CELLS SEEN     ABUNDANT YEAST     Performed at [facility]   Culture     Final   Value: MODERATE CANDIDA ALBICANS     Performed at [facility]   Report Status 01/16/2014 FINAL   Final  CULTURE, RESPIRATORY (NON-EXPECTORATED)     Status: None   Collection Time    01/30/2014  1:51 PM      Result Value Ref Range Status   Specimen Description TRACHEAL ASPIRATE   Final   Special Requests NONE   Final   Gram Stain     Final   Value: MODERATE WBC PRESENT, PREDOMINANTLY PMN     FEW SQUAMOUS EPITHELIAL CELLS PRESENT     FEW YEAST     Performed at [facility]   Culture     Final   Value: NO GROWTH     Performed at [facility]   Report Status PENDING   Incomplete      Assessment: 85 YOM on phenytoin PTA for seizures. No new seizure activity reported. Patient is tolerating CRRT with minimal interruptions.  No UOP.  3/29 level 7.9 >> corr 12.7 4/1 level 12.8 >> corr 28.87 (no reported nystagmus)  4/2 level 9.3 >> corr 21.1 4/3 level 8.5 >> corr 21.2  4/5 level 5.3 >> corr 15.5 >> currently clearing 3mg /hr  4/7 level 5.3 >> corr 15.5 (doesn't seem to be clearing)  4/8 level 4.0 >> corr 10 4/13 level 3.8 >> corr 9   Goal of Therapy:  DPH level:  10-20 mcg/mL   Plan:  - Increase DPH to 50mg  IV q8h - F/U DPH levels prn and monitor for seizure activity and s/s toxicity   Harolyn Rutherford, PharmD Clinical Pharmacist - Resident Pager: [phone] Pharmacy: [phone] 02/03/2014 12:57 PM

## 2014-01-25 DIAGNOSIS — Z515 Encounter for palliative care: Secondary | ICD-10-CM

## 2014-01-25 DIAGNOSIS — E43 Unspecified severe protein-calorie malnutrition: Secondary | ICD-10-CM

## 2014-01-25 LAB — HEPARIN INDUCED THROMBOCYTOPENIA PNL
Heparin Induced Plt Ab: NEGATIVE
Patient O.D.: 0.218
UFH HIGH DOSE UFH H: 1 %
UFH LOW DOSE 0.1 IU/ML: 1 %
UFH Low Dose 0.5 IU/mL: 1 % Release
UFH SRA RESULT: NEGATIVE

## 2014-01-25 LAB — RENAL FUNCTION PANEL
Albumin: 1.6 g/dL — ABNORMAL LOW (ref 3.5–5.2)
BUN: 13 mg/dL (ref 6–23)
CALCIUM: 7.2 mg/dL — AB (ref 8.4–10.5)
CO2: 26 mEq/L (ref 19–32)
Chloride: 101 mEq/L (ref 96–112)
Creatinine, Ser: 0.53 mg/dL (ref 0.50–1.35)
GFR calc non Af Amer: >90 mL/min (ref >90)
GLUCOSE: 143 mg/dL — AB (ref 70–99)
PHOSPHORUS: 2 mg/dL — AB (ref 2.3–4.6)
Potassium: 4.7 mEq/L (ref 3.7–5.3)
SODIUM: 135 meq/L — AB (ref 137–147)

## 2014-01-25 LAB — CBC
HCT: 28.9 % — ABNORMAL LOW (ref 39.0–52.0)
HEMATOCRIT: 27.3 % — AB (ref 39.0–52.0)
Hemoglobin: 9.4 g/dL — ABNORMAL LOW (ref 13.0–17.0)
Hemoglobin: 9.1 g/dL — ABNORMAL LOW (ref 13.0–17.0)
MCH: 29.8 pg (ref 26.0–34.0)
MCH: 30.4 pg (ref 26.0–34.0)
MCHC: 32.5 g/dL (ref 30.0–36.0)
MCHC: 33.3 g/dL (ref 30.0–36.0)
MCV: 91.7 fL (ref 78.0–100.0)
MCV: 91.3 fL (ref 78.0–100.0)
Platelets: 17 10*3/uL — CL (ref 150–400)
Platelets: 18 10*3/uL — CL (ref 150–400)
RBC: 3.15 MIL/uL — ABNORMAL LOW (ref 4.22–5.81)
RBC: 2.99 MIL/uL — ABNORMAL LOW (ref 4.22–5.81)
RDW: 18 % — AB (ref 11.5–15.5)
RDW: 18.4 % — ABNORMAL HIGH (ref 11.5–15.5)
WBC: 26.9 10*3/uL — ABNORMAL HIGH (ref 4.0–10.5)
WBC: 20.2 10*3/uL — ABNORMAL HIGH (ref 4.0–10.5)

## 2014-01-25 LAB — GLUCOSE, CAPILLARY
GLUCOSE-CAPILLARY: 151 mg/dL — AB (ref 70–99)
Glucose-Capillary: 152 mg/dL — ABNORMAL HIGH (ref 70–99)
Glucose-Capillary: 162 mg/dL — ABNORMAL HIGH (ref 70–99)

## 2014-01-25 LAB — MAGNESIUM: MAGNESIUM: 2.2 mg/dL (ref 1.5–2.5)

## 2014-01-25 LAB — CULTURE, RESPIRATORY W GRAM STAIN

## 2014-01-25 MED ORDER — FAT EMULSION 20 % IV EMUL
250.0000 mL | INTRAVENOUS | Status: DC
  Filled 2014-01-25: qty 250

## 2014-01-25 MED ORDER — LORAZEPAM 2 MG/ML IJ SOLN
1.0000 mg | INTRAMUSCULAR | Status: DC
  Administered 2014-01-26 (×4): 1 mg via INTRAVENOUS
  Filled 2014-01-25 (×4): qty 1

## 2014-01-25 MED ORDER — DEXTROSE 5 % IV SOLN
INTRAVENOUS | Status: DC
  Administered 2014-01-25: 20:00:00 via INTRAVENOUS

## 2014-01-25 MED ORDER — ATROPINE SULFATE 1 % OP SOLN
2.0000 [drp] | OPHTHALMIC | Status: DC | PRN
  Filled 2014-01-25: qty 2

## 2014-01-25 MED ORDER — SODIUM CHLORIDE 0.9 % IV SOLN
2.0000 mg/h | INTRAVENOUS | Status: DC
  Administered 2014-01-26 (×2): 2 mg/h via INTRAVENOUS
  Filled 2014-01-25 (×3): qty 2.5

## 2014-01-25 MED ORDER — MORPHINE BOLUS VIA INFUSION: 5.0000 mg | INTRAVENOUS | Status: DC | PRN

## 2014-01-25 MED ORDER — SCOPOLAMINE 1 MG/3DAYS TD PT72
1.0000 | MEDICATED_PATCH | TRANSDERMAL | Status: DC
  Administered 2014-01-26: 1.5 mg via TRANSDERMAL
  Filled 2014-01-25: qty 1

## 2014-01-25 MED ORDER — SODIUM PHOSPHATE 3 MMOLE/ML IV SOLN
20.0000 mmol | Freq: Once | INTRAVENOUS | Status: DC
  Administered 2014-01-25: 20 mmol via INTRAVENOUS
  Filled 2014-01-25: qty 6.67

## 2014-01-25 MED ORDER — HYDROMORPHONE BOLUS VIA INFUSION
1.0000 mg | INTRAVENOUS | Status: DC | PRN
  Administered 2014-01-26: 1 mg via INTRAVENOUS
  Filled 2014-01-25: qty 1

## 2014-01-25 MED ORDER — MORPHINE SULFATE 2 MG/ML IJ SOLN
2.0000 mg | INTRAMUSCULAR | Status: DC | PRN
  Administered 2014-01-25: 4 mg via INTRAVENOUS
  Filled 2014-01-25: qty 2

## 2014-01-25 MED ORDER — M.V.I. ADULT IV INJ
INTRAVENOUS | Status: DC
  Filled 2014-01-25: qty 2000

## 2014-01-25 NOTE — Progress Notes (Signed)
4 Days Post-Op  Subjective: Remains critically ill, intubated sedated on CRRT VAC - 2500 cc last 24 hours  Objective: Vital signs in last 24 hours: Temp:  [97.4 F (36.3 C)-98.4 F (36.9 C)] 97.4 F (36.3 C) (04/14 0817) Pulse Rate:  [74-109] 79 (04/14 0700) Resp:  [13-24] 14 (04/14 0700) BP: (81-116)/(46-86) 81/52 mmHg (04/14 0700) SpO2:  [94 %-100 %] 96 % (04/14 0815) Arterial Line BP: (64-97)/(46-61) 85/61 mmHg (04/13 1500) FiO2 (%):  [40 %] 40 % (04/14 0815) Weight:  [134 lb 7.7 oz (61 kg)] 134 lb 7.7 oz (61 kg) (04/14 0400) Last BM Date: 01/16/2014  Intake/Output from previous day: 04/13 0701 - 04/14 0700 In: 3635.3 [I.V.:954.3; IV Piggyback:551; TPN:2040] Out: 7169 [Drains:3100] Intake/Output this shift: Total I/O In: 137.4 [I.V.:52.4; TPN:85] Out: 251 [Drains:100; Other:151]  General appearance: intubated, not responsive to stimuli Resp: bilateral rhonchi Cardio: regular rate and rhythm, S1, S2 normal, no murmur, click, rub or gallop GI: soft, non-distended; minimal bowel sounds Wound VAC in place - good seal  Lab Results:   Recent Labs  01/16/2014 1700 01/25/14 0421  WBC 26.9* 20.2*  HGB 9.4* 9.1*  HCT 28.9* 27.3*  PLT 17* 18*   BMET  Recent Labs  01/14/2014 1700 01/25/14 0421  NA 137 135*  K 4.7 4.7  CL 102 101  CO2 26 26  GLUCOSE 122* 143*  BUN 11 13  CREATININE 0.54 0.53  CALCIUM 7.2* 7.2*   PT/INR  Recent Labs  01/17/2014 0422  LABPROT 14.5  INR 1.15   ABG  Recent Labs  01/22/14 1806  PHART 7.345*  HCO3 23.5    Studies/Results: Dg Chest Port 1 View  01/19/2014   CLINICAL DATA:  Respiratory failure  EXAM: PORTABLE CHEST - 1 VIEW  COMPARISON:  DG CHEST 1V PORT dated 01/16/2014; DG CHEST 1V PORT dated 01/22/2014; DG CHEST 1V PORT dated 01/14/2014; DG CHEST 1V PORT dated 02-Feb-2014; DG CHEST 1V PORT dated 01/17/2014  FINDINGS: The support lines and tubing are in unchanged position. There are bilateral small pleural effusions. There are  persistent bilateral interstitial and alveolar airspace opacities involving the upper and lower lungs. There is no pneumothorax. Stable cardiomediastinal silhouette. Unremarkable osseous structures.  IMPRESSION: 1. Persistent bilateral interstitial and alveolar airspace opacities with bilateral small pleural effusions. Differential diagnosis includes pulmonary edema versus multilobar pneumonia versus ARDS.   Electronically Signed   By: Kathreen Devoid   On: 02/03/2014 04:30    Anti-infectives: Anti-infectives   Start     Dose/Rate Route Frequency Ordered Stop   01/20/14 1300  vancomycin (VANCOCIN) IVPB 1000 mg/200 mL premix  Status:  Discontinued     1,000 mg 200 mL/hr over 60 Minutes Intravenous Every 24 hours 01/20/14 1152 01/22/14 0932   02/02/14 0000  meropenem (MERREM) 1 g in sodium chloride 0.9 % 100 mL IVPB     1 g 200 mL/hr over 30 Minutes Intravenous Every 12 hours 01/17/14 1405     01/17/14 1300  vancomycin (VANCOCIN) IVPB 750 mg/150 ml premix  Status:  Discontinued     750 mg 150 mL/hr over 60 Minutes Intravenous Every 24 hours 01/17/14 1211 01/20/14 1152   01/17/14 1000  meropenem (MERREM) 2 g in sodium chloride 0.9 % 100 mL IVPB  Status:  Discontinued     2 g 200 mL/hr over 30 Minutes Intravenous Every 18 hours 01/17/14 0913 01/17/14 1405   01/17/14 0000  vancomycin (VANCOCIN) 50 mg/mL oral solution 500 mg  Status:  Discontinued  500 mg Oral 4 times per day 01/16/14 2008 01/15/2014 0952   01/17/14 0000  piperacillin-tazobactam (ZOSYN) IVPB 3.375 g  Status:  Discontinued     3.375 g 100 mL/hr over 30 Minutes Intravenous 4 times per day 01/16/14 2202 01/17/14 0854   01/16/14 2200  metroNIDAZOLE (FLAGYL) tablet 500 mg  Status:  Discontinued     500 mg Oral 3 times per day 01/16/14 1905 01/16/14 2002   01/16/14 2015  metroNIDAZOLE (FLAGYL) IVPB 500 mg  Status:  Discontinued     500 mg 100 mL/hr over 60 Minutes Intravenous Every 8 hours 01/16/14 2002 02/07/2014 1001   01/16/14 2000   vancomycin (VANCOCIN) 50 mg/mL oral solution 250 mg  Status:  Discontinued     250 mg Oral 4 times per day 01/16/14 1957 01/16/14 2008   01/16/14 2000  micafungin (MYCAMINE) 100 mg in sodium chloride 0.9 % 100 mL IVPB  Status:  Discontinued     100 mg 100 mL/hr over 1 Hours Intravenous Daily 01/16/14 1957 01/25/2014 1117   01/16/14 1800  piperacillin-tazobactam (ZOSYN) IVPB 3.375 g  Status:  Discontinued     3.375 g 12.5 mL/hr over 240 Minutes Intravenous 4 times per day 01/16/14 1648 01/16/14 2202   01/12/14 1200  piperacillin-tazobactam (ZOSYN) IVPB 2.25 g  Status:  Discontinued     2.25 g 100 mL/hr over 30 Minutes Intravenous 4 times per day 01/12/14 0844 01/16/14 1648   01/12/14 0500  vancomycin (VANCOCIN) 500 mg in sodium chloride 0.9 % 100 mL IVPB  Status:  Discontinued     500 mg 100 mL/hr over 60 Minutes Intravenous Every 24 hours 01/11/14 0902 01/12/14 1242   01/10/14 0400  vancomycin (VANCOCIN) IVPB 750 mg/150 ml premix  Status:  Discontinued     750 mg 150 mL/hr over 60 Minutes Intravenous Every 24 hours 01/09/14 0353 01/11/14 0902   01/09/14 1200  piperacillin-tazobactam (ZOSYN) IVPB 3.375 g  Status:  Discontinued     3.375 g 12.5 mL/hr over 240 Minutes Intravenous Every 8 hours 01/09/14 0353 01/12/14 0844   01/09/14 0330  piperacillin-tazobactam (ZOSYN) IVPB 3.375 g     3.375 g 100 mL/hr over 30 Minutes Intravenous  Once 01/09/14 0329 01/09/14 0358   01/09/14 0330  vancomycin (VANCOCIN) IVPB 1000 mg/200 mL premix     1,000 mg 200 mL/hr over 60 Minutes Intravenous  Once 01/09/14 0329 01/09/14 0437      Assessment/Plan: s/p Procedure(s): TRACHEOSTOMY (N/A) Partial Small Bowel Resection and Repair and Re-anastomosis (N/A) GASTROSTOMY (N/A) APPLICATION OF WOUND VAC (N/A) VAC change today - Tue/Thu/Sat May consider trickle feeds via gastrostomy tube - check residuals q4  LOS: 17 days    Imogene Burn. Laurena Valko 01/25/2014

## 2014-01-25 NOTE — Progress Notes (Signed)
PULMONARY / CRITICAL CARE MEDICINE  Name: [name] [name] MRN: [id number] DOB: 1971/09/16    ADMISSION DATE:  01/07/2014  PRIMARY SERVICE: PCCM  CHIEF COMPLAINT:  Dyspnea  BRIEF PATIENT DESCRIPTION: 43 yo NH resident with schizoaffective disorder, polysubstance abuse, seizures, COPD and malnutrition admitted 3/38 with dyspnea and hypotension.  SIGNIFICANT EVENTS / STUDIES:  3/29   Admitted with acute respiratory failure and shock 4/2     Self extubated, re intubated 4/3     Renal function no worse. Hepatic function improving.  4/4     Started phenylephrine  4/5     CRRT started 4/7     Head CT >>> nad 4/6     CT abdo/pelvis >>> Distended and fluid-filled small bowel and colon  4/6     CT chest  >>> large bilateral pleural effusions with overlying atelectasis 4/7     OR >>> ischemic small bowel resection in OR 4/9     TPN started 4/10   OR >>> VAC change, bowel anastamosis, tracheostomy 4/10   Venous Doppler >>> L femoral vein DVT  4/11 IVC filter placed 4/14   Goals of care discussion with ethics committee >>> meets criteria for futile care, life sustaining measures should be withdrawn and comfort measures offered.  LINES / TUBES: L IJ CVL 3/29 >>> ETT 3/29 >>> 4/2 (self extubated) >>> 4/10 Trach 4/10 >>> R IJ HD cath 4/5 >>> 4/14 A-line 4/5 >>> 4/14 Foley 4/13 >>>  CULTURES: 3/29  MRSA PCR >>> POS 3/29  Urine >>> neg 3/28  Blood >>> neg 3/29  Blood >>> neg 4/5    Blood >>> neg 4/5    Urine >>> neg 3/31  Tracheal >>> neg 4/3    C diff >>> neg 4/5    C diff >>> neg 4/5    Respiratory >>> moderate Candida Albicans  ANTIBIOTICS: Vancomycin  3/29 >>> 4/2; 4/6 >>> 4/10 Zosyn 3/29 >>> 4/6 Flagyl 4/5 >>> 4/7 Vancomycin PO 4/6 >>> 4/7 Micafungin 4/5 >>> 4/8 Meropenem 4/7 >>>  INTERVAL HISTORY:  No acute overnight events.  VITAL SIGNS: Temp:  [97.4 F (36.3 C)-98.4 F (36.9 C)] 97.8 F (36.6 C) (04/14 0356) Pulse Rate:  [74-109] 94 (04/14 0615) Resp:  [13-24] 18  (04/14 0615) BP: (81-116)/(46-86) 98/67 mmHg (04/14 0615) SpO2:  [94 %-100 %] 100 % (04/14 0615) Arterial Line BP: (64-103)/(44-61) 85/61 mmHg (04/13 1500) FiO2 (%):  [40 %] 40 % (04/14 0600) Weight:  [61 kg (134 lb 7.7 oz)] 61 kg (134 lb 7.7 oz) (04/14 0400)  HEMODYNAMICS: CVP:  [11 mmHg] 11 mmHg  VENTILATOR SETTINGS: Vent Mode:  [-] PRVC FiO2 (%):  [40 %] 40 % Set Rate:  [16 bmp] 16 bmp Vt Set:  [570 mL] 570 mL PEEP:  [5 cmH20] 5 cmH20 Plateau Pressure:  [9 cmH20-20 cmH20] 20 cmH20  INTAKE / OUTPUT: Intake/Output     04/13 0701 - 04/14 0700   I.V. (mL/kg) 917.2 (15)   Other 90   IV Piggyback 551   TPN 1955   Total Intake(mL/kg) 3513.2 (57.6)   Drains 3050   Other 1670   Total Output 4720   Net -1206.8         PHYSICAL EXAMINATION: General:  Appears ill, mechanically ventilated Neuro:  Encephalopathic, moves upper extremities spontaneously HEENT:  Tracheostomy intact  Cardiovascular:  RRR, no m/r/g Lungs:  Bilateral diminished air entry, no w/r/r Abdomen:  VAC with serosanguinous drainage, R groin IVC filter placemen site draining yellow fluid Musculoskeletal:  Moves upper extremities non purposefully, 2+ bilateral pitting edema legs/thighs Skin:  Skin abrasions, penis ulceration  LABS: CBC  Recent Labs Lab 01/23/2014 0422 01/17/2014 1700 01/25/14 0421  WBC 15.8* 26.9* 20.2*  HGB 9.5* 9.4* 9.1*  HCT 28.1* 28.9* 27.3*  PLT 15* 17* 18*   Coag's  Recent Labs Lab 02/06/2014 0500 01/22/14 0400 01/16/2014 0422  APTT 38* 41* 39*  INR 1.42 1.25 1.15   BMET  Recent Labs Lab 01/13/2014 0422 01/29/2014 1700 01/25/14 0421  NA 134* 137 135*  K 4.6 4.7 4.7  CL 102 102 101  CO2 24 26 26   BUN 11 11 13   CREATININE 0.51 0.54 0.53  GLUCOSE 145* 122* 143*   Electrolytes  Recent Labs Lab 02/07/2014 0415  02/06/2014 0422 02/10/2014 1700 01/25/14 0421  CALCIUM 7.6*  < > 7.2* 7.2* 7.2*  MG 2.1  --  2.1  --  2.2  PHOS 2.9  < > 2.0* 2.9 2.0*  < > = values in this  interval not displayed. Sepsis Markers  Recent Labs Lab 01/30/2014 1430 01/20/14 0500 01/22/14 0400  LATICACIDVEN 3.0* 2.7* 2.3*   ABG  Recent Labs Lab 02/05/2014 1114 01/22/14 0430 01/22/14 1806  PHART 7.422 7.329* 7.345*  PCO2ART 34.1* 45.6* 40.7  PO2ART 52.0* 76.0* 54.0*   Liver Enzymes  Recent Labs Lab 02/06/2014 0500  01/22/14 0400  02/05/2014 0422 02/08/2014 1700 01/25/14 0421  AST 90*  --  60*  --  66*  --   --   ALT 136*  --  88*  --  73*  --   --   ALKPHOS 151*  --  126*  --  142*  --   --   BILITOT 5.8*  --  5.0*  --  5.1*  --   --   ALBUMIN 1.9*  < > 2.1*  < > 1.6* 1.7* 1.6*  < > = values in this interval not displayed.  Cardiac Enzymes No results found for this basename: TROPONINI, PROBNP,  in the last 168 hours  Glucose  Recent Labs Lab 01/15/2014 0809 01/21/2014 1139 01/22/2014 1519 01/27/2014 1901 01/17/2014 2351 01/25/14 0355  GLUCAP 135* 141* 130* 92 162* 151*   IMAGING:  Dg Chest Port 1 View  01/14/2014   CLINICAL DATA:  Respiratory failure  EXAM: PORTABLE CHEST - 1 VIEW  COMPARISON:  DG CHEST 1V PORT dated 01/13/2014; DG CHEST 1V PORT dated 01/22/2014; DG CHEST 1V PORT dated 01/20/2014; DG CHEST 1V PORT dated 02/07/2014; DG CHEST 1V PORT dated 01/17/2014  FINDINGS: The support lines and tubing are in unchanged position. There are bilateral small pleural effusions. There are persistent bilateral interstitial and alveolar airspace opacities involving the upper and lower lungs. There is no pneumothorax. Stable cardiomediastinal silhouette. Unremarkable osseous structures.  IMPRESSION: 1. Persistent bilateral interstitial and alveolar airspace opacities with bilateral small pleural effusions. Differential diagnosis includes pulmonary edema versus multilobar pneumonia versus ARDS.   Electronically Signed   By: Kathreen Devoid   On: 01/21/2014 04:30   Dg Chest Port 1 View  01/19/2014   CLINICAL DATA:  Adult respiratory distress syndrome  EXAM: PORTABLE CHEST - 1 VIEW   COMPARISON:  01/22/2014, 02/05/2014, 01/20/2014 and 01/09/2014  FINDINGS: Right IJ sheath, left IJ central venous catheter, and tracheostomy remain in satisfactory position. Heart size is normal. Pulmonary vascularity appears normal. There are emphysematous changes. Bilateral perihilar and bibasilar airspace opacities are without significant change compared to 01/22/2014. There posteriorly layering bilateral pleural effusions.  IMPRESSION: No significant  change in aeration of the lungs compared to 01/22/2014. Airspace disease in the perihilar regions and lung bases and bilateral posteriorly layering pleural effusions persist.  Emphysema/ COPD.   Electronically Signed   By: Curlene Dolphin M.D.   On: 02/04/2014 08:55   ASSESSMENT / PLAN:  Acute respiratory failure HCAP  Large bilateral effusions  COPD without evidence of exacerbation Septic shock AKI Ischemic bowel s/p resection Shocked liver GERD  Severe protein calorie malnutrition Thrombocytopenia Acute on chronic anemia LLE DVT s/p IVC filter placement Adrenal insufficiency Hypothyroidism Hypo / hyper glycemia Acute encephalopathy Selirium Chronic encephalomalacia  Schizoaffective disorder Seizure disorder  Extensive discussion between Ethics Committee, Critical Care team, Nephrology, Social Work  to establish goals of care for this patient who cannot voice his care preferences and has no designated medical POA or family.  It is the consensus of the group that he is suffering from multisystem organ failure requiring multiple life-sustaining therapies and refractory to available interventions since his initial admission on 3/28.  It is also the consensus that he is unlikely to survive this admission in spite of maximum therapy and, in the unlikely event he does, he would require around the clock medical care, artificial feeding / hydration, possibly artificial lung ventilation and renal replacement therapy.  Under the circumstances, it was  felt that further aggressive medical care would be futile and it is in the best patient's interest to discontinue life sustaining measures and offer comfort measures.  I have personally obtained history, examined patient, evaluated and interpreted laboratory and imaging results, reviewed medical records, formulated assessment / plan and placed orders.  CRITICAL CARE:  The patient is critically ill with multiple organ systems failure and requires high complexity decision making for assessment and support, frequent evaluation and titration of therapies, application of advanced monitoring technologies and extensive interpretation of multiple databases. Critical Care Time devoted to patient care services described in this note is 60 minutes.   Doree Fudge, MD Pulmonary and [place] Pager: [phone]  01/25/2014, 1:28 PM  01/25/2014, 6:23 AM

## 2014-01-25 NOTE — Progress Notes (Signed)
PARENTERAL NUTRITION CONSULT NOTE- Follow Up  Pharmacy Consult for TPN Indication: Massive Bowel Resection  Allergies  Allergen Reactions  . Fexofenadine Hcl Other (See Comments)    unknown  . Percocet [Oxycodone-Acetaminophen] Other (See Comments)    unknown  . Thorazine [Chlorpromazine Hcl] Other (See Comments)    unknown   Patient Measurements: Height: 6\' 2"  (188 cm) Weight: 134 lb 7.7 oz (61 kg) IBW/kg (Calculated) : 82.2  Vital Signs: Temp: 97.8 F (36.6 C) (04/14 0356) Temp src: Oral (04/14 0356) BP: 81/52 mmHg (04/14 0700) Pulse Rate: 79 (04/14 0700) Intake/Output from previous day: 04/13 0701 - 04/14 0700 In: 3635.3 [I.V.:954.3; IV Piggyback:551; TPN:2040] Out: 4855 [Drains:3100] Intake/Output from this shift:    Labs:  Recent Labs  02/01/2014 0422 01/20/2014 1700 01/25/14 0421  WBC 15.8* 26.9* 20.2*  HGB 9.5* 9.4* 9.1*  HCT 28.1* 28.9* 27.3*  PLT 15* 17* 18*  APTT 39*  --   --   INR 1.15  --   --      Recent Labs  01/12/2014 0415  02/10/2014 0422 02/06/2014 1700 01/25/14 0421  NA 134*  < > 134* 137 135*  K 4.9  < > 4.6 4.7 4.7  CL 103  < > 102 102 101  CO2 23  < > 24 26 26   GLUCOSE 179*  < > 145* 122* 143*  BUN 9  < > 11 11 13   CREATININE 0.47*  < > 0.51 0.54 0.53  CALCIUM 7.6*  < > 7.2* 7.2* 7.2*  MG 2.1  --  2.1  --  2.2  PHOS 2.9  < > 2.0* 2.9 2.0*  PROT  --   --  4.7*  --   --   ALBUMIN 1.8*  < > 1.6* 1.7* 1.6*  AST  --   --  66*  --   --   ALT  --   --  73*  --   --   ALKPHOS  --   --  142*  --   --   BILITOT  --   --  5.1*  --   --   PREALBUMIN  --   --  10.9*  --   --   TRIG  --   --  136  --   --   < > = values in this interval not displayed. Estimated Creatinine Clearance: 102.7 ml/min (by C-G formula based on Cr of 0.53).    Recent Labs  01/27/2014 1901 01/22/2014 2351 01/25/14 0355  GLUCAP 92 162* 151*   Insulin Requirements in the past 24 hours:  6 units SSI  Current Nutrition:  Clinimix E 5/15 at 75 mL/hr + lipid 20%  emulsion at 10 mL/hr provides 90g protein and 1758 kcal daily (100% of goal)  Nutritional Goals:  1700 kcal, 85-100g protein daily per RD recommendations 4/10   Assessment: 66 YOM who presented to [facility] with septic shock and HCAP on 3/29 was intubated shortly after admission. He is s/p ischemic small bowel resection on 4/7    GI: Patient has been off tube feeding since 4/5. Feeding supplements were stopped 4/8 and he is NPO with bowel rest. He has a history of GERD with chronic PPI use. S/p tracheostomy, partial small bowel resection and re-anastomosis with application of wound vac on 4/10. Drain output 4/10 3168ml yesterday.  Prealbumin 8.7 ->10.9 (remains low but trending up). Suspect pt may need more protein with CRRT initiation 4/10.  AC: New LE DVT on U/S. Cannot  anticoagulate. S/p IVC filter placement 4/11   Endo: CBGs well controlled. Pt continues on stress dose steroids. cortisol 14.4 on 4/8  Lytes: Na 135 (slightly low), Phos 2 (remains low even with NaPhos 25mmol given yesterday), CorrCa 9.1. Other lytes wnl.  Renal: Tolerating CVVHDF (goal desired removal: 75 ml/hr). Neg 1.3L yesterday. CVP 11.  Pulm: Vent 40% FiO2. S/p trach 4/10.   Cards: Requiring levophed at 4 mcg/min. HR 74-109.    Hepatobil: LFTs elevated but trending down. TBilli remains elevated at 5.1. TG trended down to 136.   Neuro: hx schizoaffective disorder, seizures and polysubstance abuse. On precedex.  ID: Day #9 of Merrem for RLL HCAP. WBC quite variable but remains elevated. Afeb. Repeat cultures pending.  Best Practices: SCDs, IV Pepcid (In TPN bag)   TPN Access: CVC Triple lumen 3/29  TPN day#: 5 (started 4/9)  Plan:  1. Will change Clinimix E 5/15 WITH LYTES to 83 mL/hr + lipid 20% emulsion at 8 mL/hr. This provides 100 g protein and 1750 kcal daily (100% of goal) 2. Multivitamin daily and trace elements MWF only due to shortage. 3. Pepcid being added to TPN bag due to shortage of IV protonix 4.  Will replace Phos with NaPhos 20 mmol 5. F/u TPN labs  Sherlon Handing, PharmD, BCPS Clinical pharmacist, pager [phone] 01/25/2014 7:40 AM

## 2014-01-25 NOTE — Procedures (Signed)
Admit: 01/02/2014 LOS: 87  20M with SZAP in group home admitted with septic shock, HCAP, septic ATN on CRRT and ischemic bowel s/p resection.    Current CRRT Prescription: Start Date: 01/16/14  Catheter: R IJ temp  BFR: 250 Pre Blood Pump: 800 DFR: 1500 Replacement Rate: 700 Goal UF: 18mL/hr neg Anticoagulation: none Clotting: approx q24h   S: Creatinine in serous drainage from femoral venous access site same as serum Ethics meeting scheduled WBC back up some No purposeful movements, eyes open No UOP  O: 04/13 0701 - 04/14 0700 In: 3635.3 [I.V.:954.3; IV Piggyback:551; TPN:2040] Out: 4855 [Drains:3100]  Filed Weights   02-08-2014 0400 01/22/2014 0400 01/25/14 0400  Weight: 65.4 kg (144 lb 2.9 oz) 63.4 kg (139 lb 12.4 oz) 61 kg (134 lb 7.7 oz)     Recent Labs Lab 01/13/2014 0422 02/05/2014 1700 01/25/14 0421  NA 134* 137 135*  K 4.6 4.7 4.7  CL 102 102 101  CO2 24 26 26   GLUCOSE 145* 122* 143*  BUN 11 11 13   CREATININE 0.51 0.54 0.53  CALCIUM 7.2* 7.2* 7.2*  PHOS 2.0* 2.9 2.0*    Recent Labs Lab 01/20/14 1223  01/22/14 0400  02/01/2014 0422 02/03/2014 1700 01/25/14 0421  WBC 19.5*  < > 19.6*  < > 15.8* 26.9* 20.2*  NEUTROABS 15.8*  --  16.0*  --  13.2*  --   --   HGB 11.3*  < > 10.0*  < > 9.5* 9.4* 9.1*  HCT 30.9*  < > 28.7*  < > 28.1* 28.9* 27.3*  MCV 80.7  < > 85.2  < > 88.6 91.7 91.3  PLT 60*  < > 44*  < > 15* 17* 18*  < > = values in this interval not displayed.  ABG    Component Value Date/Time   PHART 7.345* 01/22/2014 1806   PCO2ART 40.7 01/22/2014 1806   PO2ART 54.0* 01/22/2014 1806   HCO3 23.5 01/22/2014 1806   TCO2 25 01/22/2014 1806   ACIDBASEDEF 3.0* 01/22/2014 1806   O2SAT 92.0 01/22/2014 1806    Plan:  1. 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  Good solute clearance.  Goal UF around 4mL net negative.  Clotting rate acceptable. 2. Copius drainage from femoral access site: serous drainage.  Not urine.   3. Septic Shock: per CCM/CCS  4. VDRF 5. Hypoalbuminemia. Alb 1.6 6. Thrombocytopenia: no heparin in CRRT system.   Pearson Grippe, MD [facility] pgr [phone]

## 2014-01-25 NOTE — Ethics Note (Signed)
Ethics Committee Consult  Date: 01/25/2014 Time: 11:20 AM  Primary Committee Member:  Lenoria Farrier, PhD, RN, CNE Secondary Committee Member:  Jeanella Craze  Does the patient have decision making capacity?  No - The patient is unresponsive, intubated on mechanical ventilation requiring advanced efforts to sustain life.  Does the patient have an Scientist, physiological or [place]?  No  Legal Decision Maker:  The patient lives in an assisted living facility and prior to admission was capable of autonomous decision making. Relationship to Patient:  No family known  Mitigating Factors:  No  Name and contact information for person who requested the consult:  Dr. Nicoletta Dress, resident MD for CCM  Attending Physician:  Raylene Miyamoto, MD Dr. Alva Garnet, CCM Was attending physician notified?  Yes  Other individuals involved, present/attending, their names, relationship/role to the patient:  Meeting held at 1030 on unit 83M (2100). Present were Lenoria Farrier (ethics), Jeanella Craze (ethics), Merton Border, MD (CCM) , Dr. Earnest Conroy, MD (CCM), Dr. Joelyn Oms (nephrology), CCM resident MDs, Pharmacy residents, Donia Pounds ([place]), [place], RN 83M, Izora Ribas, Patient Care Coordinator at [facility] via telephone.  Individuals involved but unable to attend/participate their names, relationship/role to the patient:  The patient has no known family or representatives.  Reason for the consult / ethical problem (eg. Goals of care, values clarification, forum for discussion, conflict resolution, autonomy, beneficence, etc.):  Determine Goals of care and future course of treatment. Preliminary Information:  43 y/o M admitted with Septic Shock, leg pain, DVT, small bowel disease, renal failure. No known legal next of kin. Pt resides at an assisted living facility where he independently makes decisions (see CSW notes).   Objective facts related to or contributing to the problem (eg.  Pertinent medical history,  facts of the case):  3/28 admitted with [facility] and leg pain, hypotension  3/29 Intubated, CVC placed, septic shock  3/31 agitated  4/1 CSW note regarding lack of NOK and pt's independence in facility  4/2 self extubated, re-intubated  4/5 Renal function decreased, started on CRRT  4/7 CSW spoke with MD regarding best interest of patient and began to explore legal guardianship. No further f/u noted. Multiple problems-->to OR  4/8-4/10 To OR several times for VAC change and bleeding. Tracheostomy on 4/10  4/10 Diagnosed with L DVT  4/11 IVC filter placed  4/14 Pt continues to be on life support and CRRT.   Summary of consultation and recommendations: CCM and nephrology summarize that the patient's condition is not improving and further efforts towards life sustaining treatments would be futile.  Potential outcomes and prognosis was  discussed amongst all members present. Consensus amongst all present and by phone was that continuance of treatment would prove futile and outcomes would be negative. It is the recommendation of the physicians and agreement amongst all present that based on the patients current and prognosed condition that the medical futility policy be followed. This will include withdrawal of care following that policy and that patient comfort would be maximized.  Thank you for the consult. Please call again if needed.

## 2014-01-25 NOTE — Significant Event (Signed)
I attended medical ethics meeting on 01/25/14.  He has multisystem organ failure including VDRF s/p trach, ongoing pressor dependence related to septic shock, s/p small bowel resection of approximately 2 feet, and to this point little recovery of mental status.  He has dialysis dependent renal failure with no evidence to this point of any renal recovery.  He has been anuric for over 10 days and maintained on CRRT.  At baseline he was a resident of a group home, wheel chair bound, and somewhat socially interactive.  A thorough investigation has been made to identify a health care POA for this patient as detailed in the meeting with no success.    To this point he has received aggressive medical care without improvement.  Given the gravity of the failure of multiple organs, I believe that ongoing medical care will not restore him to his previous quality of life or anything similar to it.  The best outcome for him would be being chronically critically ill with a tracheostomy and requiring RRT for an indeterminate time, possibly indefinitely, superimposed on his preceding severe mental health issues.  I think this best case outcome is exceedingly unlikely, approaching 0%.    Current care is providing discomfort with little expectation of benefit; I believe it is reasonable to discontinue these life sustaining therapies and focus on providing comfort to the patient.    Pearson Grippe, MD

## 2014-01-25 NOTE — Consult Note (Signed)
Palliative Medicine Team at [facility]  Date: 01/25/2014   Patient Name: [name] [name]  DOB: 1971-02-24  MRN: [id number]  Age / Sex: 43 y.o., male   PCP: Provider Default, MD Referring Physician: Raylene Miyamoto, MD  HPI/Reason for Consultation: [name] [name] is a 43 year old man who was admitted on 01/01/2014 with SOB and hypotension and progressive severe sepsis-presumably from PNA. He had a protracted hospital course that included VDRF with tracheostomy, development of ischemic bowel, DVT and multi-organ failure from SIRS. Tragically, this gentleman had significant psychiatric disease (Schizoaffective disorder) and lived in an ALF ([place]) but has no living next of kin or close friends able to [facility] decisions for him or support him during his life threatening illness. He had no legal guardian, but per ALF staff he was active and social prior to this illness.  Palliative medicine team has been consulted after 17 days in the ICU and after ethics consultation meeting was had earlier today to assist with transition for full comfort care.  History: I have reviewed his PMH, SH and FH available in chart-limited details but I have done extensive chart review. Patient unable to provide any history.  Vitals: Blood pressure 95/52, pulse 111, temperature 97.4 F (36.3 C), temperature source Oral, resp. rate 25, height 6\' 2"  (1.88 m), weight 61 kg (134 lb 7.7 oz), SpO2 95.00%.  Exam: Appears visibly distressed. +bruxism, contractures, appears critically chronically ill, he is mildly jaundice, does not track or make eye contact but he does seem to soothe to voice and reassurance. His trach has very thick and obstructing mucous, his mouth is full of mucous, tach drg in place around sutures. He is malnourishes, extremities are extremely edematous. Breath sounds are rhonchorous and shallow. Heart is tachycardic. Abdomen is tense, multiple drgs.  Current Medications: The only orders in computer are for  PRN morphine and D5 Fluids at [facility].  Labs/Data: I have reviewed all current labs, imaging and data.  Assessment: 43 yo man transitioned to full comfort care 4/14 after a prolonged ICU stay complicated by VDRF, multi-organ failure and ischemic bowel. He has no NOK. Ethics meeting held today to implement medical futility policy and to deterine best course of action for this gentleman who has no medical surrogate.   Patient seen this evening for symptom management after receiving Palliative consult and review of his medical record.   He is currently having moderate to severe distress-per physical exam and symptom assessment.   Prognosis: hours-days  Recommendations:  He needs a continuous infusion of hydromorphone in setting of renal failure and recent CRT-will avoid morphine secondary to neurotoxic metabolites that can rapidly accumulate causing agitation. Started Dilaudid infusion at 2mg /hr with titration orders. Bolus dosing q30 minutes.  Ativan q4 hours for his extrapyramidal symptoms, jaw grinding and muscle contractures  Place scopalamine patch for his copious oral secretions and atropine SL.  Need meticulous nursing care oriented towards comfort: oral care, bathing, removal of un-needed aparatuses like his prevalon boots etc..  His primary insurance is Medicare so he may qualify for [facility]   Time: 90 minutes. 10PM-11:30PM Greater than 50%  of this time was spent coordinating care related to the above assessment and plan.  Signed by: Acquanetta Chain, DO  01/25/2014, 11:46 PM  Please contact Palliative Medicine Team phone at [phone] for questions and concerns.

## 2014-01-25 NOTE — Progress Notes (Signed)
Withdrawal of care orders received and initiated per MD orders. Megan with [facility] contacted at 1200 and made aware of the process. Izora Ribas with [place] at 1200 notified of the process,too. Shawneequa Baldridge Kellie Simmering, RN

## 2014-01-25 NOTE — Progress Notes (Signed)
Order received for withdrawal of life protocol.  Patient taken off ventilator and placed on 28% aersol trach collar.  Will continue to monitor.

## 2014-01-25 NOTE — Progress Notes (Signed)
Responded to page to be with patient as staff withdrew life support.  Patient has no family.  Prayed for patient and offered final blessings as he transitions from this life. Provided support to staff.  01/25/14 1500  Clinical Encounter Type  Visited With Patient;Health care provider  Visit Type Patient actively dying  Referral From Nurse  Ramer Katlen Seyer, Chaplain, pager [phone]

## 2014-01-25 NOTE — Significant Event (Signed)
I participated in the conference to discuss futility of [name] [name] care and have fully reviewed his hospital course since I was last involved in his care directly on 4/03.  Despite his relative youth, he was in poor health prior to this hospitalization with paraplegia, psychiatric illness, partial deafness, COPD and nursing home dependence.  He now has had an exceedingly complicated hospital course with non-resolving MODS and remains dependent on vasopressors, mechanical ventilation and CRRT.   Under the best of circumstances (meaning vibrant health previous to this illness) the chance of survival would be very small and the long term debilitation as a consequence of this illness would be substantial.  If [name] Hallum were to survive this catastrophic illness (a near impossibility) he would certainly not return to his previously limited functional status and his QOL would be unacceptable by almost anyone's assessment.  I therefore strongly believe that we are prolonging his suffering without anything good to come of our efforts  It is my strong medical opinion that the proper course of action is to provide full comfort care and implement the "Withdrawal of Life-sustaining Therapies" protocol.   Merton Border, MD ; [facility] [phone].  After 5:30 PM or weekends, call [phone]

## 2014-01-27 LAB — HEPARIN INDUCED THROMBOCYTOPENIA PNL
Heparin Induced Plt Ab: NEGATIVE
PATIENT O. D.: 0.069
UFH High Dose UFH H: 7 % Release
UFH LOW DOSE 0.1 IU/ML: 6 %
UFH Low Dose 0.5 IU/mL: 6 % Release
UFH SRA Result: NEGATIVE

## 2014-01-30 LAB — CULTURE, BLOOD (ROUTINE X 2)
Culture: NO GROWTH
Culture: NO GROWTH

## 2014-01-30 NOTE — Discharge Summary (Signed)
Name: [name] [name] MRN: [id number] DOB: [birth year]  PCCM DEATH NOTE  Time of death:  Feb 05, 2014  1950  Cause of death: Acute respiratory failure  Discharge diagnoses: Acute respiratory failure  HCAP  Large bilateral effusions  COPD without evidence of exacerbation  Septic shock  AKI  Ischemic bowel s/p resection  Shocked liver  GERD  Severe protein calorie malnutrition  Thrombocytopenia  Acute on chronic anemia  LLE DVT s/p IVC filter placement  Adrenal insufficiency  Hypothyroidism  Hypo / hyper glycemia  Acute encephalopathy  Selirium  Chronic encephalomalacia  Schizoaffective disorder  Seizure disorder  Brief hospital course:  43 years old male resident of an assisted living facility with PMH relevant for schizoaffective disorder, polysubstance abuse, seizures, COPD, malnutrition, GERD.  Admitted 3/28 with pneumonia, acute respiratory failure requiring intubation and septic shock requiring vasopressors.  Course was complicated by multisystem organ failure, requiring CRRT among other measures.  Continued to deteriorate in spite of maximum support. Extensive discussion took place between [place], Critical Care team, Nephrology, Social Work to establish goals of care for this patient who cannot voice his care preferences and has no designated medical POA or family. It was the consensus of the group that he was suffering from multisystem organ failure requiring multiple life-sustaining therapies and refractory to available interventions since his initial admission on 3/28. It was also the consensus that he is unlikely to survive this admission in spite of maximum therapy and, in the unlikely event he does, he would require around the clock medical care, artificial feeding / hydration, possibly artificial lung ventilation and renal replacement therapy. Under the circumstances, it was felt that further aggressive medical care would be futile and it is in the best patient's  interest to discontinue life sustaining measures and offer comfort measures. Life support measures were withdrawn and comfort measures instituted. The patient was transferred to medical floor.  He expired shortly after.  No resuscitation was attempted.  Doree Fudge, MD Pulmonary and [place] Pager: [phone]

## 2014-02-11 NOTE — Progress Notes (Signed)
Pt without respiration or pulse. Time of death 13-Feb-1939, pronounced by L.Tanay Misuraca, RN and B.[place] Crane, Therapist, sports. No NOK to be notified. Dr Nelda Marseille notified @ 1950. Blair Hailey, RN

## 2014-02-11 NOTE — Progress Notes (Signed)
Pt transitioned to palliative measures yesterday with formal palliative consult.  I appreciate their assistance.  This AM he appears to be comfortable.  No resp distress.    Nephrology will sign off for now.  Please call back with any questions or concerns.  RS

## 2014-02-11 NOTE — Progress Notes (Signed)
PULMONARY / CRITICAL CARE MEDICINE  Name: [name] [name] MRN: [id number] DOB: 06-14-1971    ADMISSION DATE:  12/21/2013  PRIMARY SERVICE: PCCM  CHIEF COMPLAINT:  Dyspnea  BRIEF PATIENT DESCRIPTION: 43 yo NH resident with schizoaffective disorder, polysubstance abuse, seizures, COPD and malnutrition admitted 3/38 with dyspnea and hypotension.  SIGNIFICANT EVENTS / STUDIES:  3/29   Admitted with acute respiratory failure and shock 4/2     Self extubated, re intubated 4/3     Renal function no worse. Hepatic function improving.  4/4     Started phenylephrine  4/5     CRRT started 4/7     Head CT >>> nad 4/6     CT abdo/pelvis >>> Distended and fluid-filled small bowel and colon  4/6     CT chest  >>> large bilateral pleural effusions with overlying atelectasis 4/7     OR >>> ischemic small bowel resection in OR 4/9     TPN started 4/10   OR >>> VAC change, bowel anastamosis, tracheostomy 4/10   Venous Doppler >>> L femoral vein DVT  4/11 IVC filter placed 4/14   Goals of care discussion with ethics committee >>> meets criteria for futile care, life sustaining measures should be withdrawn and comfort measures offered.  LINES / TUBES: L IJ CVL 3/29 >>> ETT 3/29 >>> 4/2 (self extubated) >>> 4/10 Trach 4/10 >>> R IJ HD cath 4/5 >>> 4/14 A-line 4/5 >>> 4/14 Foley 4/13 >>>  CULTURES: 3/29  MRSA PCR >>> POS 3/29  Urine >>> neg 3/28  Blood >>> neg 3/29  Blood >>> neg 4/5    Blood >>> neg 4/5    Urine >>> neg 3/31  Tracheal >>> neg 4/3    C diff >>> neg 4/5    C diff >>> neg 4/5    Respiratory >>> moderate Candida Albicans  ANTIBIOTICS: Vancomycin  3/29 >>> 4/2; 4/6 >>> 4/10 Zosyn 3/29 >>> 4/6 Flagyl 4/5 >>> 4/7 Vancomycin PO 4/6 >>> 4/7 Micafungin 4/5 >>> 4/8 Meropenem 4/7 >>>  INTERVAL HISTORY:  No acute overnight events.  VITAL SIGNS: Temp:  [97.4 F (36.3 C)] 97.4 F (36.3 C) (04/14 0817) Pulse Rate:  [82-116] 116 (04/15 0350) Resp:  [8-29] 11 (04/15 0350) BP:  (94-127)/(52-82) 114/69 mmHg (04/15 0005) SpO2:  [87 %-100 %] 93 % (04/15 0350) FiO2 (%):  [28 %-40 %] 35 % (04/15 0350)  HEMODYNAMICS: CVP:  [9 mmHg] 9 mmHg  VENTILATOR SETTINGS: Vent Mode:  [-] PRVC FiO2 (%):  [28 %-40 %] 35 % Set Rate:  [16 bmp] 16 bmp Vt Set:  [570 mL] 570 mL PEEP:  [5 cmH20] 5 cmH20 Plateau Pressure:  [20 cmH20] 20 cmH20  INTAKE / OUTPUT: Intake/Output     04/14 0701 - 04/15 0700 04/15 0701 - 04/16 0700   I.V. (mL/kg) 455.1 (7.5)    Other 30    IV Piggyback 172    TPN 1000    Total Intake(mL/kg) 1657.1 (27.2)    Drains 1400    Other 991    Total Output 2391     Net -733.9            PHYSICAL EXAMINATION: General:  Appears ill Neuro:  Encephalopathic HEENT:  Tracheostomy intact  Cardiovascular:  tachycardic Lungs:  On trach collar, 35% FiO2  LABS: CBC  Recent Labs Lab 02/04/2014 0422 02/08/2014 1700 01/25/14 0421  WBC 15.8* 26.9* 20.2*  HGB 9.5* 9.4* 9.1*  HCT 28.1* 28.9* 27.3*  PLT 15* 17* 18*  Coag's  Recent Labs Lab 01/25/2014 0500 01/22/14 0400 02/06/2014 0422  APTT 38* 41* 39*  INR 1.42 1.25 1.15   BMET  Recent Labs Lab 02/09/2014 0422 01/12/2014 1700 01/25/14 0421  NA 134* 137 135*  K 4.6 4.7 4.7  CL 102 102 101  CO2 24 26 26   BUN 11 11 13   CREATININE 0.51 0.54 0.53  GLUCOSE 145* 122* 143*   Electrolytes  Recent Labs Lab 01/17/2014 0415  02/02/2014 0422 02/09/2014 1700 01/25/14 0421  CALCIUM 7.6*  < > 7.2* 7.2* 7.2*  MG 2.1  --  2.1  --  2.2  PHOS 2.9  < > 2.0* 2.9 2.0*  < > = values in this interval not displayed. Sepsis Markers  Recent Labs Lab 01/16/2014 1430 01/20/14 0500 01/22/14 0400  LATICACIDVEN 3.0* 2.7* 2.3*   ABG  Recent Labs Lab 01/18/2014 1114 01/22/14 0430 01/22/14 1806  PHART 7.422 7.329* 7.345*  PCO2ART 34.1* 45.6* 40.7  PO2ART 52.0* 76.0* 54.0*   Liver Enzymes  Recent Labs Lab 01/20/2014 0500  01/22/14 0400  02/07/2014 0422 02/02/2014 1700 01/25/14 0421  AST 90*  --  60*  --  66*  --    --   ALT 136*  --  88*  --  73*  --   --   ALKPHOS 151*  --  126*  --  142*  --   --   BILITOT 5.8*  --  5.0*  --  5.1*  --   --   ALBUMIN 1.9*  < > 2.1*  < > 1.6* 1.7* 1.6*  < > = values in this interval not displayed.  Cardiac Enzymes No results found for this basename: TROPONINI, PROBNP,  in the last 168 hours  Glucose  Recent Labs Lab 01/12/2014 1139 01/23/2014 1519 02/01/2014 1901 01/18/2014 2351 01/25/14 0355 01/25/14 0732  GLUCAP 141* 130* 92 162* 151* 152*   IMAGING:  No results found. ASSESSMENT / PLAN:  Acute respiratory failure HCAP  Large bilateral effusions  COPD without evidence of exacerbation Septic shock AKI Ischemic bowel s/p resection Shocked liver GERD  Severe protein calorie malnutrition Thrombocytopenia Acute on chronic anemia LLE DVT s/p IVC filter placement Adrenal insufficiency Hypothyroidism Hypo / hyper glycemia Acute encephalopathy Selirium Chronic encephalomalacia  Schizoaffective disorder Seizure disorder   Comfort measures  Dilaudid gtt  May be transferred to medical floor  May qualify for impatient hospice  Palliative Care following  PCCM will sign off  TRH to assume care 4/16   I have personally obtained history, examined patient, evaluated and interpreted laboratory and imaging results, reviewed medical records, formulated assessment / plan and placed orders.  Doree Fudge, MD Pulmonary and [place] Pager: [phone]  01/25/2014, 10:44 PM

## 2014-02-11 NOTE — Progress Notes (Signed)
UR Completed.  Vergie Living [phone] 01/13/2014

## 2014-02-11 NOTE — Progress Notes (Signed)
Pt transferred to [place], bed10.  Report called to Barrett Henle, at [phone].

## 2014-02-11 NOTE — Progress Notes (Signed)
Nutrition Brief Note  Chart reviewed. Pt now transitioning to comfort care.  No further nutrition interventions warranted at this time.  Please re-consult as needed.   Sylva Overley RD, LDN, CNSC [phone] Pager [phone] After Hours Pager    

## 2014-02-11 NOTE — Progress Notes (Signed)
Placed pt on 21% Trach collar per MD. Sats 91%

## 2014-02-11 NOTE — Progress Notes (Signed)
Patient [name] [name]      DOB: 1971/07/21      MKL:[id number]   Palliative Medicine Team at [facility] Progress Note    Subjective:  Chart reviewed, report reviewed with my colleague. Patient eyes open but not responsive to command or stimulation.  Orders from last evening were to promote comfort, oxygen was to be weaned to room air.  Nursing just transitioned, Sats 61 %.  Reviewed Note from CCM documenting outcome of multidisciplinary decision for full comfort.   Left message for [facility] regarding change in status , patient was being consider for DC to hospice facility.  Filed Vitals:   02/04/2014 1240  BP:   Pulse: 110  Temp:   Resp: 17   Physical exam:  General: no acute distress, eyes half open, does not follow commands, no excessive accessory muscle use Pupils round, mm dry , trach with copious secretions Chest: decreased with course wet rhonchi, no wheezing CVS: tachy, S1, S2 Abd schaphoid, no grimace on palpation, vac in place Ext: cool Neuro: not responsive     Assessment and plan: 42 yr old male at baseline has been living in a ALF with no next of kin support history of polysubstance abuse and schizophrenia.  Patient was admitted with shortness of breath, hypotension resulting in severe sepsis and HCAP. Course complicated by ischemic bowel.  Underwent surgical intervention.   Ethics committee reviewed case as patient continues to do very poorly and with no surrogate multidisciplinary decision made for comfort care.   1.  DNR  2.  Pain post op wound:  Currently comfortable on dilaudid infusion with ability to titrate  3.  Respiratory Failure: agree with transition to room air.  No evidence for dyspnea  4.  Anxiety/agitation: agree with prn ativan   5:  Secretions: agree with scopolamine   Left message for Hospice Facility, discussed with Care manager.    Total time 1230-100 pm  [name] Hull L. Lovena Le, MD MBA The Palliative Medicine Team at  [facility] Phone: [phone] Pager: [phone]

## 2014-02-11 NOTE — Progress Notes (Signed)
Received patient from [facility]. Comfort measures. Notified for dilaudid drip.

## 2014-02-11 NOTE — Progress Notes (Signed)
Followed up with pt from previous chaplain visit.  Pt still unresponsive.  Chaplain said prayer for pt and ended visit.

## 2014-02-11 NOTE — Progress Notes (Signed)
Patient ID: [name] [name], male   DOB: Jul 21, 1971, 43 y.o.   MRN: [id number] Patient to receive palliative care Will sign off-we'll not plan insertion of tunneled hemodialysis catheter

## 2014-02-11 NOTE — Progress Notes (Signed)
5 Days Post-Op  Subjective: Patient is on trach collar Palliative measures Responds slightly to stimuli On TNA  Objective: Vital signs in last 24 hours: Pulse Rate:  [82-116] 111 (04/15 0833) Resp:  [8-29] 14 (04/15 0833) BP: (94-127)/(52-82) 114/69 mmHg (04/15 0005) SpO2:  [56 %-100 %] 56 % (04/15 0833) FiO2 (%):  [28 %-40 %] 35 % (04/15 0833) Last BM Date:  (PTA)  Intake/Output from previous day: 04/14 0701 - 04/15 0700 In: 1657.1 [I.V.:455.1; IV Piggyback:172; TPN:1000] Out: 2391 [Drains:1400] Intake/Output this shift:    General appearance: trach collar, eyes open, does not respond to commands GI: soft, minimal bowel sounds VAC with good seal; serous output (decreasing)  Lab Results:   Recent Labs  02/04/2014 1700 01/25/14 0421  WBC 26.9* 20.2*  HGB 9.4* 9.1*  HCT 28.9* 27.3*  PLT 17* 18*   BMET  Recent Labs  01/22/2014 1700 01/25/14 0421  NA 137 135*  K 4.7 4.7  CL 102 101  CO2 26 26  GLUCOSE 122* 143*  BUN 11 13  CREATININE 0.54 0.53  CALCIUM 7.2* 7.2*   PT/INR  Recent Labs  01/19/2014 0422  LABPROT 14.5  INR 1.15   ABG No results found for this basename: PHART, PCO2, PO2, HCO3,  in the last 72 hours  Studies/Results: No results found.  Anti-infectives: Anti-infectives   Start     Dose/Rate Route Frequency Ordered Stop   01/20/14 1300  vancomycin (VANCOCIN) IVPB 1000 mg/200 mL premix  Status:  Discontinued     1,000 mg 200 mL/hr over 60 Minutes Intravenous Every 24 hours 01/20/14 1152 01/22/14 0932   02/10/2014 0000  meropenem (MERREM) 1 g in sodium chloride 0.9 % 100 mL IVPB  Status:  Discontinued     1 g 200 mL/hr over 30 Minutes Intravenous Every 12 hours 01/17/14 1405 01/25/14 1322   01/17/14 1300  vancomycin (VANCOCIN) IVPB 750 mg/150 ml premix  Status:  Discontinued     750 mg 150 mL/hr over 60 Minutes Intravenous Every 24 hours 01/17/14 1211 01/20/14 1152   01/17/14 1000  meropenem (MERREM) 2 g in sodium chloride 0.9 % 100 mL IVPB   Status:  Discontinued     2 g 200 mL/hr over 30 Minutes Intravenous Every 18 hours 01/17/14 0913 01/17/14 1405   01/17/14 0000  vancomycin (VANCOCIN) 50 mg/mL oral solution 500 mg  Status:  Discontinued     500 mg Oral 4 times per day 01/16/14 2008 01/15/2014 0952   01/17/14 0000  piperacillin-tazobactam (ZOSYN) IVPB 3.375 g  Status:  Discontinued     3.375 g 100 mL/hr over 30 Minutes Intravenous 4 times per day 01/16/14 2202 01/17/14 0854   01/16/14 2200  metroNIDAZOLE (FLAGYL) tablet 500 mg  Status:  Discontinued     500 mg Oral 3 times per day 01/16/14 1905 01/16/14 2002   01/16/14 2015  metroNIDAZOLE (FLAGYL) IVPB 500 mg  Status:  Discontinued     500 mg 100 mL/hr over 60 Minutes Intravenous Every 8 hours 01/16/14 2002 01/20/2014 1001   01/16/14 2000  vancomycin (VANCOCIN) 50 mg/mL oral solution 250 mg  Status:  Discontinued     250 mg Oral 4 times per day 01/16/14 1957 01/16/14 2008   01/16/14 2000  micafungin (MYCAMINE) 100 mg in sodium chloride 0.9 % 100 mL IVPB  Status:  Discontinued     100 mg 100 mL/hr over 1 Hours Intravenous Daily 01/16/14 1957 02/02/2014 1117   01/16/14 1800  piperacillin-tazobactam (ZOSYN) IVPB 3.375  g  Status:  Discontinued     3.375 g 12.5 mL/hr over 240 Minutes Intravenous 4 times per day 01/16/14 1648 01/16/14 2202   01/12/14 1200  piperacillin-tazobactam (ZOSYN) IVPB 2.25 g  Status:  Discontinued     2.25 g 100 mL/hr over 30 Minutes Intravenous 4 times per day 01/12/14 0844 01/16/14 1648   01/12/14 0500  vancomycin (VANCOCIN) 500 mg in sodium chloride 0.9 % 100 mL IVPB  Status:  Discontinued     500 mg 100 mL/hr over 60 Minutes Intravenous Every 24 hours 01/11/14 0902 01/12/14 1242   01/10/14 0400  vancomycin (VANCOCIN) IVPB 750 mg/150 ml premix  Status:  Discontinued     750 mg 150 mL/hr over 60 Minutes Intravenous Every 24 hours 01/09/14 0353 01/11/14 0902   01/09/14 1200  piperacillin-tazobactam (ZOSYN) IVPB 3.375 g  Status:  Discontinued     3.375  g 12.5 mL/hr over 240 Minutes Intravenous Every 8 hours 01/09/14 0353 01/12/14 0844   01/09/14 0330  piperacillin-tazobactam (ZOSYN) IVPB 3.375 g     3.375 g 100 mL/hr over 30 Minutes Intravenous  Once 01/09/14 0329 01/09/14 0358   01/09/14 0330  vancomycin (VANCOCIN) IVPB 1000 mg/200 mL premix     1,000 mg 200 mL/hr over 60 Minutes Intravenous  Once 01/09/14 0329 01/09/14 0437      Assessment/Plan: s/p Procedure(s): TRACHEOSTOMY (N/A) Partial Small Bowel Resection and Repair and Re-anastomosis (N/A) GASTROSTOMY (N/A) APPLICATION OF WOUND VAC (N/A) Awaiting return of bowel function.  Gastrostomy OK to use for meds and feeds Palliative measures per CCM  LOS: 18 days    Imogene Burn. Landan Fedie 02/06/2014

## 2014-02-11 DEATH — deceased

## 2014-10-27 ENCOUNTER — Encounter (HOSPITAL_COMMUNITY): Payer: Self-pay | Admitting: Vascular Surgery
